# Patient Record
Sex: Male | Born: 1960 | Race: White | Hispanic: No | Marital: Married | Smoking: Former smoker
Health system: Southern US, Community
[De-identification: ages and names within clinical notes are randomized; demographics above are authoritative.]

## PROBLEM LIST (undated history)

## (undated) DIAGNOSIS — Z87442 Personal history of urinary calculi: Secondary | ICD-10-CM

## (undated) DIAGNOSIS — G709 Myoneural disorder, unspecified: Secondary | ICD-10-CM

## (undated) DIAGNOSIS — Z9989 Dependence on other enabling machines and devices: Secondary | ICD-10-CM

## (undated) DIAGNOSIS — I219 Acute myocardial infarction, unspecified: Secondary | ICD-10-CM

## (undated) DIAGNOSIS — I2699 Other pulmonary embolism without acute cor pulmonale: Secondary | ICD-10-CM

## (undated) DIAGNOSIS — E119 Type 2 diabetes mellitus without complications: Secondary | ICD-10-CM

## (undated) DIAGNOSIS — I4892 Unspecified atrial flutter: Secondary | ICD-10-CM

## (undated) DIAGNOSIS — I2510 Atherosclerotic heart disease of native coronary artery without angina pectoris: Secondary | ICD-10-CM

## (undated) DIAGNOSIS — M545 Low back pain, unspecified: Secondary | ICD-10-CM

## (undated) DIAGNOSIS — N200 Calculus of kidney: Secondary | ICD-10-CM

## (undated) DIAGNOSIS — I10 Essential (primary) hypertension: Secondary | ICD-10-CM

## (undated) DIAGNOSIS — E785 Hyperlipidemia, unspecified: Secondary | ICD-10-CM

## (undated) DIAGNOSIS — I4819 Other persistent atrial fibrillation: Secondary | ICD-10-CM

## (undated) DIAGNOSIS — M1990 Unspecified osteoarthritis, unspecified site: Secondary | ICD-10-CM

## (undated) DIAGNOSIS — G4733 Obstructive sleep apnea (adult) (pediatric): Secondary | ICD-10-CM

## (undated) DIAGNOSIS — G8929 Other chronic pain: Secondary | ICD-10-CM

## (undated) DIAGNOSIS — Z8669 Personal history of other diseases of the nervous system and sense organs: Secondary | ICD-10-CM

## (undated) HISTORY — DX: Personal history of other diseases of the nervous system and sense organs: Z86.69

---

## 1997-07-19 ENCOUNTER — Emergency Department (HOSPITAL_COMMUNITY): Admission: EM | Admit: 1997-07-19 | Discharge: 1997-07-19 | Payer: Self-pay | Admitting: Emergency Medicine

## 2009-11-04 ENCOUNTER — Emergency Department (HOSPITAL_COMMUNITY): Admission: EM | Admit: 2009-11-04 | Discharge: 2009-11-04 | Payer: Self-pay | Admitting: Emergency Medicine

## 2010-08-20 ENCOUNTER — Other Ambulatory Visit: Payer: Self-pay | Admitting: Emergency Medicine

## 2010-08-20 DIAGNOSIS — M2391 Unspecified internal derangement of right knee: Secondary | ICD-10-CM

## 2010-08-26 ENCOUNTER — Ambulatory Visit
Admission: RE | Admit: 2010-08-26 | Discharge: 2010-08-26 | Disposition: A | Discharge status: Home / Self Care | Payer: BC Managed Care – PPO | Source: Ambulatory Visit | Attending: Emergency Medicine | Admitting: Emergency Medicine

## 2010-08-26 DIAGNOSIS — M2391 Unspecified internal derangement of right knee: Secondary | ICD-10-CM

## 2012-04-04 ENCOUNTER — Ambulatory Visit: Payer: BC Managed Care – PPO

## 2012-04-04 ENCOUNTER — Ambulatory Visit (INDEPENDENT_AMBULATORY_CARE_PROVIDER_SITE_OTHER): Payer: BC Managed Care – PPO | Admitting: Emergency Medicine

## 2012-04-04 VITALS — BP 134/84 | HR 74 | Temp 97.9°F | Resp 16 | Ht 66.5 in | Wt 231.0 lb

## 2012-04-04 DIAGNOSIS — M25559 Pain in unspecified hip: Secondary | ICD-10-CM

## 2012-04-04 DIAGNOSIS — M549 Dorsalgia, unspecified: Secondary | ICD-10-CM

## 2012-04-04 DIAGNOSIS — N200 Calculus of kidney: Secondary | ICD-10-CM

## 2012-04-04 DIAGNOSIS — M545 Low back pain: Secondary | ICD-10-CM

## 2012-04-04 LAB — POCT URINALYSIS DIPSTICK
Blood, UA: NEGATIVE
Protein, UA: NEGATIVE
Spec Grav, UA: 1.015
Urobilinogen, UA: 0.2
pH, UA: 5

## 2012-04-04 LAB — POCT UA - MICROSCOPIC ONLY
Casts, Ur, LPF, POC: NEGATIVE
Crystals, Ur, HPF, POC: NEGATIVE
Yeast, UA: NEGATIVE

## 2012-04-04 MED ORDER — CYCLOBENZAPRINE HCL 10 MG PO TABS: ORAL_TABLET | ORAL | Status: DC

## 2012-04-04 MED ORDER — MELOXICAM 15 MG PO TABS: 15.0000 mg | ORAL_TABLET | Freq: Every day | ORAL | Status: DC

## 2012-04-04 NOTE — Progress Notes (Signed)
  Subjective:    Patient ID: Paul Todd, male    DOB: 04-06-1960, 52 y.o.   MRN: [id number]  Hip Pain  There was no injury mechanism. The pain is present in the right hip, right leg, left hip and left leg. The quality of the pain is described as aching and cramping. The pain is at a severity of 7/10 (when hes in pain). The pain is moderate. The pain has been worsening since onset. Associated symptoms include numbness and tingling. Associated symptoms comments: In his feet more on the left foot. The symptoms are aggravated by movement and weight bearing. He has tried ice and NSAIDs for the symptoms. The treatment provided no relief.   Complaints of hip pain for 4-5 months left side is more pain full than right side it doesn't bother  Him when he's in bed lying on this sides. He just got a new job 6 months ago so he's more on his feet it hurts more when he's standing or just walking around.   He has tried all types of shoes used ice and heat packs even has taking ibuprofen nothing seems to help. He doesn't have any pain when he's just sitting The pain runs from hips down to feet sometimes his left foot feels numb   Review of Systems  Neurological: Positive for tingling and numbness.       Objective:   Physical Exam There is tenderness over lower lumbar spine. There is limited internal rotation. External rotation is normal. Deep tendon reflexes are 2+ and symmetrical. Straight leg raising is negative to 90 bilaterally motor strength is 5 out of 5 all muscle groups  [facility] reading (PRIMARY) by  Dr.Brynnly Bonet is calcification in the area of the left kidney. There degenerative changes of the lumbar spine. 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 and appears to be some small cystic areas in the neck of the right hip please comment  Results for orders placed in visit on 04/04/12  POCT URINALYSIS DIPSTICK      Result Value Range   Color, UA yellow     Clarity, UA clear     Glucose, UA neg     Bilirubin, UA neg     Ketones, UA neg     Spec Grav, UA 1.015     Blood, UA neg     pH, UA 5.0     Protein, UA neg     Urobilinogen, UA 0.2     Nitrite, UA neg     Leukocytes, UA Negative    POCT UA - MICROSCOPIC ONLY      Result Value Range   WBC, Ur, HPF, POC 0-1     RBC, urine, microscopic 0-1     Bacteria, U Microscopic neg     Mucus, UA neg     Epithelial cells, urine per micros 0-1     Crystals, Ur, HPF, POC neg     Casts, Ur, LPF, POC neg     Yeast, UA neg         Assessment & Plan:  We'll go ahead with x-rays of both hips as well as x-rays of the back. There are degenerative changes present of the LS spine and also of the hips we'll proceed with an MRI. He wants to hold off on a CT of the kidneys at the present time .

## 2012-04-05 ENCOUNTER — Telehealth: Payer: Self-pay

## 2012-04-05 NOTE — Telephone Encounter (Signed)
MRI lumbar spine needs a peer-to-peer done by 04/07/12 at 4:00pm. Call [phone], Opt 2.

## 2012-04-06 ENCOUNTER — Telehealth: Payer: Self-pay | Admitting: Radiology

## 2012-04-06 DIAGNOSIS — M545 Low back pain: Secondary | ICD-10-CM

## 2012-04-06 NOTE — Telephone Encounter (Signed)
Call patient m now the insurance carrier would not approve his MRI. Would he like referral to an orthopedist for their opinion or would he like to give a trial of physical therapy please let me know what she would prefer

## 2012-04-06 NOTE — Telephone Encounter (Signed)
Patient does not qualify for MRI lumbar at this time per [facility] at [facility]. Order retracted for now for Lumbar MRI scan, patient has to be treated for 4-6 weeks before this will be authorized, he must return for office visit after 4-6 weeks of treatment with daily medications, office visit note at this visit must indicate there was not significant improvement with treatment of medications and or physical therapy. Note should also indicate he is a candidate for Epidural injections and or Surgery pending results of MRI.  Please also note any neurologic impairment patient may have at follow up office visit. Also do you wish to proceed with physical therapy since he does not qualify for the MRI . Thanks. Kariah Loredo

## 2012-04-07 NOTE — Telephone Encounter (Signed)
Called patient, he can not go to physical therapy at this time because he works out of town, he agrees to [employer], this is made, to you [employer]

## 2012-04-07 NOTE — Telephone Encounter (Signed)
Patient doesn't meet criteria for this study at this point.  No neurologic symptoms, hasn't tried conservative therapy x 4-6 weeks.

## 2012-06-01 ENCOUNTER — Telehealth: Payer: Self-pay

## 2012-06-01 NOTE — Telephone Encounter (Signed)
Request given to xray 

## 2012-06-01 NOTE — Telephone Encounter (Signed)
Pt is needing a copy of his hip/and back xrays for his appt this week with [facility]  Please call [phone] once ready to pick up

## 2012-11-23 ENCOUNTER — Ambulatory Visit (INDEPENDENT_AMBULATORY_CARE_PROVIDER_SITE_OTHER): Payer: BC Managed Care – PPO | Admitting: Internal Medicine

## 2012-11-23 VITALS — BP 142/86 | HR 68 | Temp 98.1°F | Resp 18 | Ht 66.0 in | Wt 236.6 lb

## 2012-11-23 DIAGNOSIS — A609 Anogenital herpesviral infection, unspecified: Secondary | ICD-10-CM

## 2012-11-23 DIAGNOSIS — B009 Herpesviral infection, unspecified: Secondary | ICD-10-CM

## 2012-11-23 DIAGNOSIS — Z7189 Other specified counseling: Secondary | ICD-10-CM

## 2012-11-23 DIAGNOSIS — R319 Hematuria, unspecified: Secondary | ICD-10-CM

## 2012-11-23 DIAGNOSIS — I499 Cardiac arrhythmia, unspecified: Secondary | ICD-10-CM

## 2012-11-23 DIAGNOSIS — N200 Calculus of kidney: Secondary | ICD-10-CM

## 2012-11-23 LAB — LIPID PANEL: Cholesterol: 203 mg/dL — ABNORMAL HIGH (ref 0–200)

## 2012-11-23 LAB — POCT CBC
HCT, POC: 52.8 % (ref 43.5–53.7)
Hemoglobin: 16.6 g/dL (ref 14.1–18.1)
MCH, POC: 31.9 pg — AB (ref 27–31.2)
MPV: 10 fL (ref 0–99.8)
POC MID %: 6.9 %M (ref 0–12)
RBC: 5.21 M/uL (ref 4.69–6.13)
WBC: 10.1 10*3/uL (ref 4.6–10.2)

## 2012-11-23 LAB — POCT URINALYSIS DIPSTICK
Bilirubin, UA: NEGATIVE
Glucose, UA: NEGATIVE
Spec Grav, UA: 1.015
Urobilinogen, UA: 0.2

## 2012-11-23 LAB — PSA: PSA: 1.65 ng/mL (ref <=4.00)

## 2012-11-23 LAB — COMPREHENSIVE METABOLIC PANEL
Albumin: 4.2 g/dL (ref 3.5–5.2)
BUN: 17 mg/dL (ref 6–23)
Calcium: 9 mg/dL (ref 8.4–10.5)
Chloride: 106 mEq/L (ref 96–112)
Glucose, Bld: 98 mg/dL (ref 70–99)
Potassium: 4.3 mEq/L (ref 3.5–5.3)

## 2012-11-23 LAB — POCT UA - MICROSCOPIC ONLY
Bacteria, U Microscopic: NEGATIVE
Casts, Ur, LPF, POC: NEGATIVE
Mucus, UA: NEGATIVE

## 2012-11-23 LAB — TSH: TSH: 1.221 u[IU]/mL (ref 0.350–4.500)

## 2012-11-23 MED ORDER — VALACYCLOVIR HCL 1 G PO TABS: 1000.0000 mg | ORAL_TABLET | Freq: Two times a day (BID) | ORAL | Status: DC

## 2012-11-23 NOTE — Patient Instructions (Signed)
Cardiac Arrhythmia Your heart is a muscle that works to pump blood through your body by regular contractions. The beating of your heart is controlled by a system of special pacemaker cells. These cells control the electrical activity of the heart. When the system controlling this regular beating is disturbed, a heart rhythm abnormality (arrhythmia) results. WHEN YOUR HEART SKIPS A BEAT One of the most common and least serious heart arrhythmias is called an ectopic or premature atrial heartbeat (PAC). This may be noticed as a small change in your regular pulse. A PAC originates from the top part (atrium) of the heart. Within the right atrium, the SA node is the area that normally controls the regularity of the heart. PACs occur in heart tissue outside of the SA node region. You may feel this as a skipped beat or heart flutter, especially if several occur in succession or occur frequently.  Another arrhythmia is ventricular premature complex (VCP or PVC). These extra beats start out in the bottom, more muscular chambers of the heart. In most cases a PVC is harmless. If there are underlying causes that are making the heart irritable such as an overactive thyroid or a prior heart attack PVCs may be of more concern. In a few cases, medications to control the heart rhythm may be prescribed. Things to try at home:  Cut down or avoid alcohol, tobacco and caffeine.  Get enough sleep.  Reduce stress.  Exercise more. WHEN THE HEART BEATS TOO FAST Atrial tachycardia is a fast heart rate, which starts out in the atrium. It may last from minutes to much longer. Your heart may beat 140 to 240 times per minute instead of the normal 60 to 100.  Symptoms include a worried feeling (anxiety) and a sense that your heart is beating fast and hard.  You may be able to stop the fast rate by holding your breath or bearing down as if you were going to have a bowel movement.  This type of fast rate is usually not  dangerous. Atrial fibrillation and atrial flutter are other fast rhythms that start in the atria. Both conditions keep the atria from filling with enough blood so the heart does not work well.  Symptoms include feeling light-headed or faint.  These fast rates may be the result of heart damage or disease. Too much thyroid hormone may play a role.  There may be no clear cause or it may be from heart disease or damage.  Medication or a special electrical treatment (cardioversion) may be needed to get the heart beating normally. Ventricular tachycardia is a fast heart rate that starts in the lower muscular chambers (ventricles) This is a serious disorder that requires treatment as soon as possible. You need someone else to get and use a small defibrillator.  Symptoms include collapse, chest pain, or being short of breath.  Treatment may include medication, procedures to improve blood flow to the heart, or an implantable cardiac defibrillator (ICD). DIAGNOSIS   A cardiogram (EKG or ECG) will be done to see the arrhythmia, as well as lab tests to check the underlying cause.  If the extra beats or fast rate come and go, you may wear a Holter monitor that records your heart rate for a longer period of time. SEEK MEDICAL CARE IF:  You have irregular or fast heartbeats (palpitations).  You experience skipped beats.  You develop lightheadedness.  You have chest discomfort.  You have shortness of breath.  You have more frequent episodes, if  you are already being treated. SEEK IMMEDIATE MEDICAL CARE IF:   You have severe chest pain, especially if the pain is crushing or pressure-like and spreads to the arms, back, neck, or jaw, or if you have sweating, feeling sick to your stomach (nausea), or shortness of breath. THIS IS AN EMERGENCY. Do not wait to see if the pain will go away. Get medical help at once. Call 911 or 0 (operator). DO NOT drive yourself to the hospital.  You feel dizzy or  faint.  You have episodes of previously documented atrial tachycardia that do not resolve with the techniques your caregiver has taught you.  Irregular or rapid heartbeats begin to occur more often than in the past, especially if they are associated with more pronounced symptoms or of longer duration. Document Released: 12/23/2004 Document Revised: 03/17/2011 Document Reviewed: 08/11/2007 [facility] Patient Information 2014 [place], [place]. Heartbeats (How the Heart Works) Your heart is a hollow muscular organ that pumps blood around your body. This is necessary for life because the blood carries the oxygen we breathe and the food we eat to all the cells of the body. The blood also carries the waste products away from the cells.  Humans have a heart with four chambers. A heartbeat is a two-part pumping action that takes about a second. Blood collects in the two upper chambers of the heart (the right and left atria). When these chambers are full, a group of specialized cells (the sinoatrial node) sends out an electrical signal that makes the atria squeeze (contract). This contraction completes the filling of the resting lower heart chambers (the right and left ventricles) by pushing a little extra blood through the mitral and tricuspid valves. The ventricles are the muscular chambers of the heart. The right ventricle pushes blood through the lungs. Because the left ventricle pumps blood to the rest of the body, it is more muscular. The period when the ventricles are filling is called diastole. The bottom number in your blood pressure is measured at this time. When the ventricles are contracting, it is called systole. Systole is the top number in your blood pressure.  Once the bottom muscular chambers of the heart are full of blood, slightly delayed electrical signals from the atria travel along a network of cells to the ventricles, causing them to contract. As the tricuspid and mitral valves shut tight to  prevent a backflow of blood, the pulmonary (pulmonic) and aortic valves are pushed open. While the right ventricle pushes blood through the lungs to pick up oxygen, oxygen-rich blood flows from the left ventricle to the heart and other parts of the body.  When the ventricles relax, the pulmonary and aortic valves close. The lower pressure in the ventricles causes the tricuspid and mitral valves to open, and blood stored in the atria rushes in and the cycle begins again.  Your heart beats faster and works harder during times of exertion. It works less hard and beats more slowly when you are resting. Your brain sends signals to the heart to meet the needs of your body. By avoiding smoking and controlling blood pressure, cholesterol, and blood sugar, you may help your heart stay healthy longer.  Document Released: 11/28/2003 Document Revised: 08/25/2012 Document Reviewed: 05/17/2012 [facility] Patient Information 2014 [place], [place].

## 2012-11-23 NOTE — Progress Notes (Signed)
  Subjective:    Patient ID: Paul Todd, male    DOB: 10/30/1960, 52 y.o.   MRN: [id number]  HPI Hx of kidney stone and heart irregular beat while traveling, passed kidney stone in hospital. Was started on 325mg  asa and told to f/up with family doctor. Also started on metoprolol for arrythmia. Has no sxs now, feels great.. Dr. Cleta Alberts his doctor and will schedule cpe.   Review of Systems     Objective:   Physical Exam  Constitutional: He is oriented to person, place, and time. He appears well-developed and well-nourished. No distress.  HENT:  Right Ear: External ear normal.  Left Ear: External ear normal.  Eyes: EOM are normal.  Neck: Neck supple.  Cardiovascular: Normal rate, regular rhythm and normal heart sounds.   Pulmonary/Chest: Effort normal and breath sounds normal. No respiratory distress. He has no wheezes. He has no rales. He exhibits no tenderness.  Neurological: He is alert and oriented to person, place, and time. He exhibits normal muscle tone. Coordination normal.  Psychiatric: He has a normal mood and affect.   EKG normal probably Results for orders placed in visit on 11/23/12  POCT CBC      Result Value Range   WBC 10.1  4.6 - 10.2 K/uL   Lymph, poc 3.1  0.6 - 3.4   POC LYMPH PERCENT 30.2  10 - 50 %L   MID (cbc) 0.7  0 - 0.9   POC MID % 6.9  0 - 12 %M   POC Granulocyte 6.3  2 - 6.9   Granulocyte percent 62.8  37 - 80 %G   RBC 5.21  4.69 - 6.13 M/uL   Hemoglobin 16.6  14.1 - 18.1 g/dL   HCT, POC 40.9  81.1 - 53.7 %   MCV 101.3 (*) 80 - 97 fL   MCH, POC 31.9 (*) 27 - 31.2 pg   MCHC 31.4 (*) 31.8 - 35.4 g/dL   RDW, POC 91.4     Platelet Count, POC 183  142 - 424 K/uL   MPV 10.0  0 - 99.8 fL  POCT UA - MICROSCOPIC ONLY      Result Value Range   WBC, Ur, HPF, POC 0-1     RBC, urine, microscopic 6-10     Bacteria, U Microscopic neg     Mucus, UA neg     Epithelial cells, urine per micros neg     Crystals, Ur, HPF, POC neg     Casts, Ur, LPF, POC neg      Yeast, UA neg    POCT URINALYSIS DIPSTICK      Result Value Range   Color, UA yellow     Clarity, UA clear     Glucose, UA neg     Bilirubin, UA neg     Ketones, UA neg     Spec Grav, UA 1.015     Blood, UA moderate     pH, UA 5.0     Protein, UA neg     Urobilinogen, UA 0.2     Nitrite, UA neg     Leukocytes, UA Negative       Feels well     Assessment & Plan:  Kidney stone passed/Hematuria persists Stable arrythmia on new metoprolol/EKG poor rwave progression anteriorly See Dr, Cleta Alberts for cpe/labs done plan further eval hematuria

## 2013-01-24 ENCOUNTER — Encounter (INDEPENDENT_AMBULATORY_CARE_PROVIDER_SITE_OTHER): Payer: Self-pay

## 2013-01-28 ENCOUNTER — Encounter: Payer: Self-pay | Admitting: Internal Medicine

## 2013-01-28 ENCOUNTER — Ambulatory Visit (INDEPENDENT_AMBULATORY_CARE_PROVIDER_SITE_OTHER): Payer: BC Managed Care – PPO | Admitting: Internal Medicine

## 2013-01-28 VITALS — BP 134/90 | HR 66 | Ht 67.0 in | Wt 229.9 lb

## 2013-01-28 DIAGNOSIS — Z789 Other specified health status: Secondary | ICD-10-CM

## 2013-01-28 DIAGNOSIS — F17200 Nicotine dependence, unspecified, uncomplicated: Secondary | ICD-10-CM

## 2013-01-28 DIAGNOSIS — R5383 Other fatigue: Secondary | ICD-10-CM

## 2013-01-28 DIAGNOSIS — R0609 Other forms of dyspnea: Secondary | ICD-10-CM

## 2013-01-28 DIAGNOSIS — G4710 Hypersomnia, unspecified: Secondary | ICD-10-CM

## 2013-01-28 DIAGNOSIS — Z13220 Encounter for screening for lipoid disorders: Secondary | ICD-10-CM

## 2013-01-28 DIAGNOSIS — I4891 Unspecified atrial fibrillation: Secondary | ICD-10-CM

## 2013-01-28 DIAGNOSIS — R5381 Other malaise: Secondary | ICD-10-CM

## 2013-01-28 DIAGNOSIS — I480 Paroxysmal atrial fibrillation: Secondary | ICD-10-CM

## 2013-01-28 DIAGNOSIS — Z720 Tobacco use: Secondary | ICD-10-CM

## 2013-01-28 DIAGNOSIS — Z9189 Other specified personal risk factors, not elsewhere classified: Secondary | ICD-10-CM

## 2013-01-28 DIAGNOSIS — Z87898 Personal history of other specified conditions: Secondary | ICD-10-CM

## 2013-01-28 DIAGNOSIS — R079 Chest pain, unspecified: Secondary | ICD-10-CM

## 2013-01-28 DIAGNOSIS — R0683 Snoring: Secondary | ICD-10-CM

## 2013-01-28 DIAGNOSIS — Z7289 Other problems related to lifestyle: Secondary | ICD-10-CM

## 2013-01-28 DIAGNOSIS — R0989 Other specified symptoms and signs involving the circulatory and respiratory systems: Secondary | ICD-10-CM

## 2013-01-28 DIAGNOSIS — Z8249 Family history of ischemic heart disease and other diseases of the circulatory system: Secondary | ICD-10-CM

## 2013-01-28 DIAGNOSIS — I200 Unstable angina: Secondary | ICD-10-CM

## 2013-01-28 NOTE — Patient Instructions (Addendum)
Please have fasting blood work today.   Your physician has recommended that you have a sleep study. This test records several body functions during sleep, including: brain activity, eye movement, oxygen and carbon dioxide blood levels, heart rate and rhythm, breathing rate and rhythm, the flow of air through your mouth and nose, snoring, body muscle movements, and chest and belly movement. This will be scheduled at [facility].  Your physician has requested that you have a lexiscan myoview. For further information please visit [email or web address]. Please follow instruction sheet, as given.  Your physician recommends that you schedule a follow-up appointment in 2-3 weeks, or before you leave for work.

## 2013-01-30 ENCOUNTER — Encounter: Payer: Self-pay | Admitting: Internal Medicine

## 2013-01-30 DIAGNOSIS — I214 Non-ST elevation (NSTEMI) myocardial infarction: Secondary | ICD-10-CM | POA: Insufficient documentation

## 2013-01-30 DIAGNOSIS — Z720 Tobacco use: Secondary | ICD-10-CM | POA: Insufficient documentation

## 2013-01-30 DIAGNOSIS — Z8249 Family history of ischemic heart disease and other diseases of the circulatory system: Secondary | ICD-10-CM | POA: Insufficient documentation

## 2013-01-30 DIAGNOSIS — R5383 Other fatigue: Secondary | ICD-10-CM | POA: Insufficient documentation

## 2013-01-30 DIAGNOSIS — I480 Paroxysmal atrial fibrillation: Secondary | ICD-10-CM | POA: Insufficient documentation

## 2013-01-30 NOTE — Progress Notes (Signed)
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 He apparently travels significantly and has a pretty poor diet. He reports drinking about 6 alcoholic beverages per week and smokes about one half pack per day for the past 30 years. Recently, while up in [place], he was suffering from a kidney stone and was noted to have atrial fibrillation. He was unaware of this, and was given medication to slow his heart rate. Apparently he converted back into sinus and was instructed to be on aspirin.  He has recently been describing some chest pain which is substernal and radiates to the jaw and down into his right arm. The symptoms last for about 5-10 minutes and go away at rest, if he has been exerting himself. He also has a history of heart disease both in his mother and a father who had high blood pressure.  In addition he filled out an S4 sleepiness scale today which was scored as 12 suggesting a higher than likely possibility of sleep apnea.  He does report difficulty staying asleep, and feeling drowsy throughout the day with the need to take frequent naps.  PMHx:  Past Medical History  Diagnosis Date  . Chronic kidney disease     History reviewed. No pertinent past surgical history.  FAMHx:  Family History  Problem Relation Age of Onset  . Heart disease Mother   . Diabetes Mother   . Diabetes Father     SOCHx:   reports that he has been smoking Cigarettes.  He has a 17.5 pack-year smoking history. He has never used smokeless tobacco. He reports that he drinks alcohol. He reports that he does not use illicit drugs.  ALLERGIES:  No Known Allergies  ROS: A comprehensive review of systems was negative except for: Constitutional: positive for fatigue and difficulty sleeping, snoring Cardiovascular: positive for exertional chest  pressure/discomfort  HOME MEDS: Current Outpatient Prescriptions  Medication Sig Dispense Refill  . aspirin 325 MG tablet Take 325 mg by mouth daily.       No current facility-administered medications for this visit.    LABS/IMAGING: No results found for this or any previous visit (from the past 48 hour(s)). No results found.  VITALS: BP 134/90  Pulse 66  Ht 5\' 7"  (1.702 m)  Wt 229 lb 14.4 oz (104.282 kg)  BMI 36.00 kg/m2  EXAM: General appearance: alert and no distress Neck: no carotid bruit and no JVD Lungs: clear to auscultation bilaterally Heart: regular rate and rhythm, S1, S2 normal, no murmur, click, rub or gallop Abdomen: soft, non-tender; bowel sounds normal; no masses,  no organomegaly Extremities: extremities normal, atraumatic, no cyanosis or edema Pulses: 2+ and symmetric Skin: Skin color, texture, turgor normal. No rashes or lesions Neurologic: Grossly normal Psych: Mood, affect normal  EKG: Normal sinus rhythm at 66  ASSESSMENT: 1. Chest pain concerning for unstable angina 2. Tobacco abuse 3. Family history of coronary disease 4. Paroxysmal atrial fibrillation 5. Fatigue/snoring, concerning for possible obstructive sleep apnea 6. Low back pain, difficulty walking  PLAN: 1.   Mr. Torrence has been having chest pain which radiates to his jaw and to his right arm that is concerning for unstable angina. This could possibly be acid reflux, however the symptoms are more exertional and tender last for about 5-10 minutes and improved with rest. He also had an  unexplained episode of paroxysmal atrial fibrillation, without recurrence that I am aware of. His EKG shows sinus rhythm today. This could have been related to his acute kidney stone and medications, however it does raise the possibility of underlying ischemia or possibly obstructive sleep apnea. He did have a score of 12 on his Epworth Sleepiness Scale today. I would therefore recommend a formal sleep study.  He should also have a lexiscan nuclear stress test to further evaluate for coronary ischemia.  Unfortunately, with chronic back pain issues, he will not be able to walk on a treadmill. I will also plan to get a fasting lipid profile today as well to get some idea about his cholesterol risk.  Plan to see him back to discuss the findings in a few weeks.  Thanks again for the referral.  Pixie Casino, MD, [facility] Attending Cardiologist [facility]  HILTY,Kenneth C 01/30/2013, 2:29 PM

## 2013-01-31 LAB — NMR LIPOPROFILE WITH LIPIDS
Cholesterol, Total: 195 mg/dL (ref <200)
HDL Particle Number: 34.9 umol/L (ref >=30.5)
HDL SIZE: 8.5 nm — AB (ref >=9.2)
HDL-C: 43 mg/dL (ref >=40)
LARGE HDL: 1.5 umol/L — AB (ref >=4.8)
LARGE VLDL-P: 5.7 nmol/L — AB (ref <=2.7)
LDL (calc): 111 mg/dL — ABNORMAL HIGH (ref <100)
LDL PARTICLE NUMBER: 1774 nmol/L — AB (ref <1000)
LDL SIZE: 20 nm — AB (ref >20.5)
LP-IR Score: 79 — ABNORMAL HIGH (ref <=45)
SMALL LDL PARTICLE NUMBER: 1186 nmol/L — AB (ref <=527)
TRIGLYCERIDES: 206 mg/dL — AB (ref <150)
VLDL SIZE: 48.2 nm — AB (ref <=46.6)

## 2013-02-02 ENCOUNTER — Other Ambulatory Visit (HOSPITAL_COMMUNITY): Payer: Self-pay | Admitting: *Deleted

## 2013-02-02 ENCOUNTER — Telehealth: Payer: Self-pay | Admitting: *Deleted

## 2013-02-02 ENCOUNTER — Ambulatory Visit (HOSPITAL_COMMUNITY)
Admission: RE | Admit: 2013-02-02 | Discharge: 2013-02-02 | Disposition: A | Discharge status: Home / Self Care | Payer: BC Managed Care – PPO | Source: Ambulatory Visit | Attending: Cardiovascular Disease | Admitting: Cardiovascular Disease

## 2013-02-02 DIAGNOSIS — R5381 Other malaise: Secondary | ICD-10-CM | POA: Insufficient documentation

## 2013-02-02 DIAGNOSIS — I480 Paroxysmal atrial fibrillation: Secondary | ICD-10-CM

## 2013-02-02 DIAGNOSIS — E785 Hyperlipidemia, unspecified: Secondary | ICD-10-CM

## 2013-02-02 DIAGNOSIS — M79609 Pain in unspecified limb: Secondary | ICD-10-CM | POA: Insufficient documentation

## 2013-02-02 DIAGNOSIS — E669 Obesity, unspecified: Secondary | ICD-10-CM | POA: Insufficient documentation

## 2013-02-02 DIAGNOSIS — F17200 Nicotine dependence, unspecified, uncomplicated: Secondary | ICD-10-CM | POA: Insufficient documentation

## 2013-02-02 DIAGNOSIS — R002 Palpitations: Secondary | ICD-10-CM | POA: Insufficient documentation

## 2013-02-02 DIAGNOSIS — R079 Chest pain, unspecified: Secondary | ICD-10-CM

## 2013-02-02 DIAGNOSIS — R5383 Other fatigue: Secondary | ICD-10-CM

## 2013-02-02 DIAGNOSIS — Z8249 Family history of ischemic heart disease and other diseases of the circulatory system: Secondary | ICD-10-CM | POA: Insufficient documentation

## 2013-02-02 DIAGNOSIS — Z789 Other specified health status: Secondary | ICD-10-CM

## 2013-02-02 DIAGNOSIS — I4891 Unspecified atrial fibrillation: Secondary | ICD-10-CM

## 2013-02-02 DIAGNOSIS — R42 Dizziness and giddiness: Secondary | ICD-10-CM | POA: Insufficient documentation

## 2013-02-02 DIAGNOSIS — R0602 Shortness of breath: Secondary | ICD-10-CM | POA: Insufficient documentation

## 2013-02-02 MED ORDER — REGADENOSON 0.4 MG/5ML IV SOLN
0.4000 mg | Freq: Once | INTRAVENOUS | Status: AC
  Administered 2013-02-02: 0.4 mg via INTRAVENOUS

## 2013-02-02 MED ORDER — TECHNETIUM TC 99M SESTAMIBI GENERIC - CARDIOLITE
30.6000 | Freq: Once | INTRAVENOUS | Status: AC | PRN
  Administered 2013-02-02: 31 via INTRAVENOUS

## 2013-02-02 MED ORDER — ATORVASTATIN CALCIUM 40 MG PO TABS: 40.0000 mg | ORAL_TABLET | Freq: Every day | ORAL | Status: DC

## 2013-02-02 MED ORDER — AMINOPHYLLINE 25 MG/ML IV SOLN
75.0000 mg | Freq: Once | INTRAVENOUS | Status: AC
  Administered 2013-02-02: 75 mg via INTRAVENOUS

## 2013-02-02 MED ORDER — TECHNETIUM TC 99M SESTAMIBI GENERIC - CARDIOLITE
10.9000 | Freq: Once | INTRAVENOUS | Status: AC | PRN
  Administered 2013-02-02: 10.9 via INTRAVENOUS

## 2013-02-02 NOTE — Procedures (Addendum)
[place] [address] [place] [address] [phone]  Cardiology Nuclear Med Study  Paul Todd is a 54 y.o. male     MRN : [id number]     DOB: [birth year]  Procedure Date: 02/02/2013  Nuclear Med Background Indication for Stress Test:  Evaluation for Ischemia History:  A-Fib Cardiac Risk Factors: Family History - CAD, Obesity and Smoker  Symptoms:  Chest Pain, Fatigue, Light-Headedness, Palpitations and Bilateral leg pain.   Nuclear Pre-Procedure Caffeine/Decaff Intake:  7:00pm NPO After: 5:00am   IV Site: R Hand  IV 0.9% NS with Angio Cath:  22g  Chest Size (in):  42"  IV Started by: Azucena Cecil, RN  Height: 5\' 7"  (1.702 m)  Cup Size: n/a  BMI:  Body mass index is 35.86 kg/(m^2). Weight:  229 lb (103.874 kg)   Tech Comments:  n/a    Nuclear Med Study 1 or 2 day study: 1 day  Stress Test Type:  [place] Provider:  Lyman Bishop, MD   Resting Radionuclide: Technetium 78m Sestamibi  Resting Radionuclide Dose: 10.9 mCi   Stress Radionuclide:  Technetium 37m Sestamibi  Stress Radionuclide Dose: 30.6 mCi           Stress Protocol Rest HR: 67 Stress HR: 86  Rest BP: 150/98 Stress BP: 153/73  Exercise Time (min): n/a METS: n/a   Predicted Max HR: 167 bpm % Max HR: 53.29 bpm Rate Pressure Product: 13884  Dose of Adenosine (mg):  n/a Dose of Lexiscan: 0.4 mg  Dose of Atropine (mg): n/a Dose of Dobutamine: n/a mcg/kg/min (at max HR)  Stress Test Technologist: Leane Para, CCT Nuclear Technologist: Imagene Riches, CNMT   Rest Procedure:  Myocardial perfusion imaging was performed at rest 45 minutes following the intravenous administration of Technetium 57m Sestamibi. Stress Procedure:  The patient received IV Lexiscan 0.4 mg over 15-seconds.  Technetium 63m Sestamibi injected at 30-seconds.  The patient had SOB and developed a headache late in recovery; 75 mg of IV Aminophylline was  administered with resolution of symptoms.  There were no significant changes with Lexiscan.  Quantitative spect images were obtained after a 45 minute delay.  Transient Ischemic Dilatation (Normal <1.22):  1.03 Lung/Heart Ratio (Normal <0.45):  0.31 QGS EDV:  119 ml QGS ESV:  63 ml LV Ejection Fraction: 48%  Signed by       Rest ECG: NSR - Normal EKG and PRWP  Stress ECG: No significant ST segment change suggestive of ischemia.  QPS Raw Data Images:  Normal; no motion artifact; normal heart/lung ratio. Stress Images:  Normal homogeneous uptake with mild basl inferior thinning. Rest Images:  Normal homogeneous uptake in all areas of the myocardium. Subtraction (SDS):  No evidence for significant ischemia.  Impression Exercise Capacity:  Lexiscan with no exercise. BP Response:  Normal blood pressure response. Clinical Symptoms:  Mild shortness of breath ECG Impression:  No significant ST segment change suggestive of ischemia. Comparison with Prior Nuclear Study: No images to compare  Overall Impression:  Low risk nuclear study without evidence for significant scar or ischemia.  LV Wall Motion:  Mildly depressed LV Function, EF 48%; NL Wall Motion   Shelva Majestic A, MD  02/02/2013 6:45 PM

## 2013-02-02 NOTE — Telephone Encounter (Signed)
Message copied by Fidel Levy on Wed Feb 02, 2013  3:10 PM ------      Message from: Pixie Casino      Created: Wed Feb 02, 2013  1:10 PM       Cholesterol is too high. Recommend starting lipitor 40 mg daily.            -[name] ------

## 2013-02-02 NOTE — Telephone Encounter (Signed)
Called patient with lab results. Instructed to start lipitor 40mg  daily per [name] and repeat NMR in 3 months

## 2013-02-08 ENCOUNTER — Encounter (HOSPITAL_COMMUNITY): Payer: Self-pay | Admitting: Pharmacy Technician

## 2013-02-08 ENCOUNTER — Encounter: Payer: Self-pay | Admitting: Internal Medicine

## 2013-02-08 ENCOUNTER — Ambulatory Visit (INDEPENDENT_AMBULATORY_CARE_PROVIDER_SITE_OTHER): Payer: BC Managed Care – PPO | Admitting: Internal Medicine

## 2013-02-08 VITALS — BP 150/80 | HR 68 | Ht 67.0 in | Wt 231.4 lb

## 2013-02-08 DIAGNOSIS — Z9189 Other specified personal risk factors, not elsewhere classified: Secondary | ICD-10-CM

## 2013-02-08 DIAGNOSIS — Z8249 Family history of ischemic heart disease and other diseases of the circulatory system: Secondary | ICD-10-CM

## 2013-02-08 DIAGNOSIS — R079 Chest pain, unspecified: Secondary | ICD-10-CM

## 2013-02-08 DIAGNOSIS — F17200 Nicotine dependence, unspecified, uncomplicated: Secondary | ICD-10-CM

## 2013-02-08 DIAGNOSIS — I4891 Unspecified atrial fibrillation: Secondary | ICD-10-CM

## 2013-02-08 DIAGNOSIS — R5381 Other malaise: Secondary | ICD-10-CM

## 2013-02-08 DIAGNOSIS — Z720 Tobacco use: Secondary | ICD-10-CM

## 2013-02-08 DIAGNOSIS — R5383 Other fatigue: Secondary | ICD-10-CM

## 2013-02-08 DIAGNOSIS — D689 Coagulation defect, unspecified: Secondary | ICD-10-CM

## 2013-02-08 DIAGNOSIS — I480 Paroxysmal atrial fibrillation: Secondary | ICD-10-CM

## 2013-02-08 DIAGNOSIS — I200 Unstable angina: Secondary | ICD-10-CM

## 2013-02-08 DIAGNOSIS — Z87898 Personal history of other specified conditions: Secondary | ICD-10-CM

## 2013-02-08 DIAGNOSIS — R9439 Abnormal result of other cardiovascular function study: Secondary | ICD-10-CM

## 2013-02-08 DIAGNOSIS — Z01818 Encounter for other preprocedural examination: Secondary | ICD-10-CM

## 2013-02-08 NOTE — Progress Notes (Signed)
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 He apparently travels significantly and has a pretty poor diet. He reports drinking about 6 alcoholic beverages per week and smokes about one half pack per day for the past 30 years. Recently, while up in [place], he was suffering from a kidney stone and was noted to have atrial fibrillation. He was unaware of this, and was given medication to slow his heart rate. Apparently he converted back into sinus and was instructed to be on aspirin.  He has recently been describing some chest pain which is substernal and radiates to the jaw and down into his right arm. The symptoms last for about 5-10 minutes and go away at rest, if he has been exerting himself. He also has a history of heart disease both in his mother and a father who had high blood pressure.  In addition he filled out an S4 sleepiness scale today which was scored as 12 suggesting a higher than likely possibility of sleep apnea.  He does report difficulty staying asleep, and feeling drowsy throughout the day with the need to take frequent naps.   [name] returns today for followup of his nuclear stress test which was performed on 02/02/2013. This showed essentially no reversible ischemia, however the EF was reduced at 48%. It is unclear to me whether this is a gating abnormality or perhaps due to long-standing hypertension, undiagnosed sleep apnea, or multivessel coronary disease. He did have the episode of paroxysmal atrial fibrillation and continues to have chest pain which is very concerning for angina. He also had a recent lipid profile demonstrating an LDL of 111 and LDL particle number of 1774.  Triglycerides were elevated at 206.  I recommended starting Lipitor 40 mg daily.  PMHx:  Past Medical History  Diagnosis Date  . Chronic  kidney disease     History reviewed. No pertinent past surgical history.  FAMHx:  Family History  Problem Relation Age of Onset  . Heart disease Mother   . Diabetes Mother   . Diabetes Father     SOCHx:   reports that he has been smoking Cigarettes.  He has a 17.5 pack-year smoking history. He has never used smokeless tobacco. He reports that he drinks alcohol. He reports that he does not use illicit drugs.  ALLERGIES:  No Known Allergies  ROS: A comprehensive review of systems was negative except for: Constitutional: positive for fatigue and difficulty sleeping, snoring Cardiovascular: positive for exertional chest pressure/discomfort  HOME MEDS: Current Outpatient Prescriptions  Medication Sig Dispense Refill  . aspirin 325 MG tablet Take 325 mg by mouth daily.      Marland Kitchen atorvastatin (LIPITOR) 40 MG tablet Take 1 tablet (40 mg total) by mouth daily.  90 tablet  3  . naproxen sodium (ALEVE) 220 MG tablet Take 440 mg by mouth daily as needed.      Marland Kitchen omeprazole (PRILOSEC OTC) 20 MG tablet Take 20 mg by mouth daily.       No current facility-administered medications for this visit.    LABS/IMAGING: No results found for this or any previous visit (from the past 48 hour(s)). No results found.  VITALS: BP 150/80  Pulse 68  Ht 5\' 7"  (1.702 m)  Wt 231 lb 6.4 oz (104.962 kg)  BMI 36.23 kg/m2  EXAM: deferred  EKG: deferred  ASSESSMENT: 1. Continued chest pain concerning for unstable angina - low risk NST but EF 48% 2. Tobacco abuse 3. Family history of coronary disease 4. Paroxysmal atrial fibrillation 5. Fatigue/snoring, concerning for possible obstructive sleep apnea 6. Low back pain, difficulty walking  PLAN: 1.   [name] continues to have chest pain which radiates to the jaw and to the right arm. Some of the symptoms or after eating which could represent reflux, however his stress test did demonstrate an abnormal EF of 48%. This could represent a gating  abnormality or perhaps a nonischemic cardiomyopathy. However given his ongoing chest pain, concerned about possible balanced ischemia. He does have a number of cardiac risk factors and had the episode of paroxysmal A. fib in the past.  At this point given his ongoing chest pain, would recommend cardiac catheterization for definitive study to evaluate for possible ischemia. We discussed the risk and benefits of this procedure today and he is agreeable to it.  I suspect we can use a radial approach. He is scheduled for a sleep study tonight and hopefully that will shed some light on whether he has sleep apnea, which I highly suspect. Finally, I have encouraged him to continue to take Lipitor and we'll recheck a lipid profile in about 3 months.  If his symptoms are possibly due to reflux, I also recommended he start taking omeprazole 20 mg daily.  Thanks again for the referral. I will be in touch regarding the results of his cardiac catheterization.  Pixie Casino, MD, [facility] Attending Cardiologist [facility]  Christianjames Soule C 02/08/2013, 5:13 PM

## 2013-02-08 NOTE — Patient Instructions (Addendum)
Your physician has requested that you have a cardiac catheterization. Cardiac catheterization is used to diagnose and/or treat various heart conditions. Doctors may recommend this procedure for a number of different reasons. The most common reason is to evaluate chest pain. Chest pain can be a symptom of coronary artery disease (CAD), and cardiac catheterization can show whether plaque is narrowing or blocking your heart's arteries. This procedure is also used to evaluate the valves, as well as measure the blood flow and oxygen levels in different parts of your heart. For further information please visit [facility]. Please follow instruction sheet, as given.  Please try to schedule this for Tuesday 2/10 with [name].  You will need to have blood work and a chest x-ray 3-5 days prior to this procedure.  Please go to [address] [facility] [facility]) to have both.  No appointment is needed.   Your physician recommends that you schedule a follow-up appointment after your heart catheterization.  Please start taking omeprazole 20 mg daily.

## 2013-02-10 ENCOUNTER — Ambulatory Visit
Admission: RE | Admit: 2013-02-10 | Discharge: 2013-02-10 | Disposition: A | Discharge status: Home / Self Care | Payer: BC Managed Care – PPO | Source: Ambulatory Visit | Attending: Internal Medicine | Admitting: Internal Medicine

## 2013-02-10 DIAGNOSIS — Z01818 Encounter for other preprocedural examination: Secondary | ICD-10-CM

## 2013-02-10 LAB — BASIC METABOLIC PANEL
BUN: 18 mg/dL (ref 6–23)
CO2: 22 mEq/L (ref 19–32)
CREATININE: 0.83 mg/dL (ref 0.50–1.35)
Calcium: 8.9 mg/dL (ref 8.4–10.5)
Chloride: 107 mEq/L (ref 96–112)
GLUCOSE: 141 mg/dL — AB (ref 70–99)
POTASSIUM: 4.1 meq/L (ref 3.5–5.3)
Sodium: 139 mEq/L (ref 135–145)

## 2013-02-10 LAB — PROTIME-INR
INR: 0.94 (ref <1.50)
Prothrombin Time: 12.5 seconds (ref 11.6–15.2)

## 2013-02-10 LAB — APTT: aPTT: 28 seconds (ref 24–37)

## 2013-02-10 LAB — CBC
HCT: 44.5 % (ref 39.0–52.0)
Hemoglobin: 15.5 g/dL (ref 13.0–17.0)
MCH: 31.6 pg (ref 26.0–34.0)
MCHC: 34.8 g/dL (ref 30.0–36.0)
MCV: 90.8 fL (ref 78.0–100.0)
PLATELETS: 176 10*3/uL (ref 150–400)
RBC: 4.9 MIL/uL (ref 4.22–5.81)
RDW: 14 % (ref 11.5–15.5)
WBC: 9.5 10*3/uL (ref 4.0–10.5)

## 2013-02-10 LAB — TSH: TSH: 1.195 u[IU]/mL (ref 0.350–4.500)

## 2013-02-11 ENCOUNTER — Encounter (HOSPITAL_COMMUNITY): Payer: Self-pay | Admitting: Pharmacy Technician

## 2013-02-11 ENCOUNTER — Telehealth: Payer: Self-pay | Admitting: Internal Medicine

## 2013-02-11 NOTE — Telephone Encounter (Signed)
Patient was scheduled for cardiac cath on Tuesday 02/15/13 with [name].  He states that he received a call from [facility] and was informed of his financial responsibility for this procedure and states that he cannot afford it.  He has cancelled the cath, but he needs a note to return to work.

## 2013-02-11 NOTE — Telephone Encounter (Signed)
PATIENT HAS DECIDED TO HAVE PROCEDURE DONE.

## 2013-02-12 ENCOUNTER — Encounter (HOSPITAL_COMMUNITY)
Admission: EM | Disposition: A | Discharge status: Home / Self Care | Payer: Self-pay | Source: Home / Self Care | Attending: Cardiology

## 2013-02-12 ENCOUNTER — Encounter (HOSPITAL_COMMUNITY): Payer: Self-pay | Admitting: Emergency Medicine

## 2013-02-12 ENCOUNTER — Emergency Department (HOSPITAL_COMMUNITY): Payer: BC Managed Care – PPO

## 2013-02-12 ENCOUNTER — Ambulatory Visit (HOSPITAL_COMMUNITY): Admit: 2013-02-12 | Payer: Self-pay | Admitting: Cardiovascular Disease

## 2013-02-12 ENCOUNTER — Other Ambulatory Visit: Payer: Self-pay

## 2013-02-12 ENCOUNTER — Inpatient Hospital Stay (HOSPITAL_COMMUNITY)
Admission: EM | Admit: 2013-02-12 | Discharge: 2013-02-16 | DRG: 247 | Disposition: A | Discharge status: Home / Self Care | Payer: BC Managed Care – PPO | Attending: Cardiology | Admitting: Cardiology

## 2013-02-12 DIAGNOSIS — I214 Non-ST elevation (NSTEMI) myocardial infarction: Secondary | ICD-10-CM | POA: Diagnosis present

## 2013-02-12 DIAGNOSIS — I498 Other specified cardiac arrhythmias: Secondary | ICD-10-CM | POA: Diagnosis present

## 2013-02-12 DIAGNOSIS — E785 Hyperlipidemia, unspecified: Secondary | ICD-10-CM | POA: Diagnosis present

## 2013-02-12 DIAGNOSIS — Z01818 Encounter for other preprocedural examination: Secondary | ICD-10-CM

## 2013-02-12 DIAGNOSIS — I480 Paroxysmal atrial fibrillation: Secondary | ICD-10-CM | POA: Diagnosis present

## 2013-02-12 DIAGNOSIS — I4891 Unspecified atrial fibrillation: Secondary | ICD-10-CM

## 2013-02-12 DIAGNOSIS — Z720 Tobacco use: Secondary | ICD-10-CM

## 2013-02-12 DIAGNOSIS — G8929 Other chronic pain: Secondary | ICD-10-CM | POA: Diagnosis present

## 2013-02-12 DIAGNOSIS — I2582 Chronic total occlusion of coronary artery: Secondary | ICD-10-CM | POA: Diagnosis present

## 2013-02-12 DIAGNOSIS — K5900 Constipation, unspecified: Secondary | ICD-10-CM | POA: Diagnosis not present

## 2013-02-12 DIAGNOSIS — I208 Other forms of angina pectoris: Secondary | ICD-10-CM

## 2013-02-12 DIAGNOSIS — I219 Acute myocardial infarction, unspecified: Secondary | ICD-10-CM

## 2013-02-12 DIAGNOSIS — I200 Unstable angina: Secondary | ICD-10-CM | POA: Diagnosis present

## 2013-02-12 DIAGNOSIS — Z8249 Family history of ischemic heart disease and other diseases of the circulatory system: Secondary | ICD-10-CM

## 2013-02-12 DIAGNOSIS — I2510 Atherosclerotic heart disease of native coronary artery without angina pectoris: Secondary | ICD-10-CM

## 2013-02-12 DIAGNOSIS — I442 Atrioventricular block, complete: Secondary | ICD-10-CM | POA: Diagnosis present

## 2013-02-12 DIAGNOSIS — F17200 Nicotine dependence, unspecified, uncomplicated: Secondary | ICD-10-CM | POA: Diagnosis present

## 2013-02-12 DIAGNOSIS — Z9861 Coronary angioplasty status: Secondary | ICD-10-CM

## 2013-02-12 DIAGNOSIS — I2119 ST elevation (STEMI) myocardial infarction involving other coronary artery of inferior wall: Principal | ICD-10-CM | POA: Diagnosis present

## 2013-02-12 DIAGNOSIS — M549 Dorsalgia, unspecified: Secondary | ICD-10-CM | POA: Diagnosis present

## 2013-02-12 DIAGNOSIS — G4730 Sleep apnea, unspecified: Secondary | ICD-10-CM | POA: Diagnosis present

## 2013-02-12 HISTORY — DX: Calculus of kidney: N20.0

## 2013-02-12 HISTORY — DX: Acute myocardial infarction, unspecified: I21.9

## 2013-02-12 HISTORY — PX: CORONARY ANGIOPLASTY WITH STENT PLACEMENT: SHX49

## 2013-02-12 HISTORY — PX: LEFT HEART CATHETERIZATION WITH CORONARY ANGIOGRAM: SHX5451

## 2013-02-12 HISTORY — PX: PERCUTANEOUS CORONARY STENT INTERVENTION (PCI-S): SHX5485

## 2013-02-12 HISTORY — DX: Atherosclerotic heart disease of native coronary artery without angina pectoris: I25.10

## 2013-02-12 HISTORY — DX: Hyperlipidemia, unspecified: E78.5

## 2013-02-12 LAB — MRSA PCR SCREENING: MRSA BY PCR: NEGATIVE

## 2013-02-12 LAB — PROTIME-INR
INR: 2.32 — ABNORMAL HIGH (ref 0.00–1.49)
INR: 1.11 (ref 0.00–1.49)
PROTHROMBIN TIME: 14.1 s (ref 11.6–15.2)
Prothrombin Time: 24.7 seconds — ABNORMAL HIGH (ref 11.6–15.2)

## 2013-02-12 LAB — GLUCOSE, CAPILLARY: Glucose-Capillary: 101 mg/dL — ABNORMAL HIGH (ref 70–99)

## 2013-02-12 LAB — CBC
HCT: 48.6 % (ref 39.0–52.0)
HEMOGLOBIN: 16.9 g/dL (ref 13.0–17.0)
MCH: 32.8 pg (ref 26.0–34.0)
MCHC: 34.8 g/dL (ref 30.0–36.0)
MCV: 94.2 fL (ref 78.0–100.0)
Platelets: 176 10*3/uL (ref 150–400)
RBC: 5.16 MIL/uL (ref 4.22–5.81)
RDW: 13.6 % (ref 11.5–15.5)
WBC: 11.8 10*3/uL — ABNORMAL HIGH (ref 4.0–10.5)

## 2013-02-12 LAB — POCT I-STAT TROPONIN I
Troponin i, poc: 0.03 ng/mL (ref 0.00–0.08)
Troponin i, poc: 0.17 ng/mL (ref 0.00–0.08)

## 2013-02-12 LAB — BASIC METABOLIC PANEL
BUN: 20 mg/dL (ref 6–23)
CO2: 21 mEq/L (ref 19–32)
Calcium: 9.4 mg/dL (ref 8.4–10.5)
Chloride: 106 mEq/L (ref 96–112)
Creatinine, Ser: 0.93 mg/dL (ref 0.50–1.35)
GFR calc Af Amer: >90 mL/min (ref >90)
Glucose, Bld: 125 mg/dL — ABNORMAL HIGH (ref 70–99)
POTASSIUM: 4.5 meq/L (ref 3.7–5.3)
SODIUM: 143 meq/L (ref 137–147)

## 2013-02-12 LAB — APTT: aPTT: 68 seconds — ABNORMAL HIGH (ref 24–37)

## 2013-02-12 LAB — TROPONIN I: Troponin I: 15.96 ng/mL (ref <0.30)

## 2013-02-12 SURGERY — LEFT HEART CATHETERIZATION WITH CORONARY ANGIOGRAM
Anesthesia: LOCAL

## 2013-02-12 MED ORDER — HEPARIN (PORCINE) IN NACL 100-0.45 UNIT/ML-% IJ SOLN
1200.0000 [IU]/h | INTRAMUSCULAR | Status: DC
  Administered 2013-02-12: 1200 [IU]/h via INTRAVENOUS
  Filled 2013-02-12: qty 250

## 2013-02-12 MED ORDER — MIDAZOLAM HCL 2 MG/2ML IJ SOLN
INTRAMUSCULAR | Status: AC
  Filled 2013-02-12: qty 2

## 2013-02-12 MED ORDER — ASPIRIN EC 81 MG PO TBEC
81.0000 mg | DELAYED_RELEASE_TABLET | Freq: Every day | ORAL | Status: DC
  Administered 2013-02-12 – 2013-02-14 (×3): 81 mg via ORAL
  Filled 2013-02-12 (×3): qty 1

## 2013-02-12 MED ORDER — ONDANSETRON HCL 4 MG/2ML IJ SOLN: 4.0000 mg | Freq: Four times a day (QID) | INTRAMUSCULAR | Status: DC | PRN

## 2013-02-12 MED ORDER — HEPARIN (PORCINE) IN NACL 2-0.9 UNIT/ML-% IJ SOLN
INTRAMUSCULAR | Status: AC
Start: 2013-02-12 — End: 2013-02-12
  Filled 2013-02-12: qty 1500

## 2013-02-12 MED ORDER — ATROPINE SULFATE 0.1 MG/ML IJ SOLN
INTRAMUSCULAR | Status: AC
  Filled 2013-02-12: qty 10

## 2013-02-12 MED ORDER — ASPIRIN 81 MG PO CHEW
81.0000 mg | CHEWABLE_TABLET | ORAL | Status: AC
  Filled 2013-02-12: qty 1

## 2013-02-12 MED ORDER — HEPARIN BOLUS VIA INFUSION
4000.0000 [IU] | Freq: Once | INTRAVENOUS | Status: DC
  Filled 2013-02-12: qty 4000

## 2013-02-12 MED ORDER — METOPROLOL TARTRATE 25 MG PO TABS
25.0000 mg | ORAL_TABLET | Freq: Three times a day (TID) | ORAL | Status: DC
  Administered 2013-02-13: 25 mg via ORAL
  Filled 2013-02-12 (×4): qty 1

## 2013-02-12 MED ORDER — METOPROLOL TARTRATE 25 MG PO TABS: 12.5000 mg | ORAL_TABLET | Freq: Two times a day (BID) | ORAL | Status: DC

## 2013-02-12 MED ORDER — BIVALIRUDIN 250 MG IV SOLR
INTRAVENOUS | Status: AC
  Filled 2013-02-12: qty 250

## 2013-02-12 MED ORDER — ACETAMINOPHEN 325 MG PO TABS: 650.0000 mg | ORAL_TABLET | ORAL | Status: DC | PRN

## 2013-02-12 MED ORDER — ZOLPIDEM TARTRATE 5 MG PO TABS
5.0000 mg | ORAL_TABLET | Freq: Every evening | ORAL | Status: DC | PRN
  Administered 2013-02-12: 5 mg via ORAL
  Filled 2013-02-12: qty 1

## 2013-02-12 MED ORDER — SODIUM CHLORIDE 0.9 % IV SOLN
INTRAVENOUS | Status: DC
  Administered 2013-02-12: 75 mL/h via INTRAVENOUS

## 2013-02-12 MED ORDER — NITROGLYCERIN 0.4 MG SL SUBL
0.4000 mg | SUBLINGUAL_TABLET | SUBLINGUAL | Status: DC | PRN
  Administered 2013-02-12 (×3): 0.4 mg via SUBLINGUAL
  Filled 2013-02-12: qty 25

## 2013-02-12 MED ORDER — ATORVASTATIN CALCIUM 40 MG PO TABS: 40.0000 mg | ORAL_TABLET | Freq: Every day | ORAL | Status: DC

## 2013-02-12 MED ORDER — SODIUM CHLORIDE 0.9 % IV SOLN
0.2500 mg/kg/h | INTRAVENOUS | Status: AC
  Filled 2013-02-12: qty 250

## 2013-02-12 MED ORDER — HEPARIN SODIUM (PORCINE) 5000 UNIT/ML IJ SOLN
INTRAMUSCULAR | Status: AC
  Administered 2013-02-12: 4000 [IU] via INTRAVENOUS
  Filled 2013-02-12: qty 1

## 2013-02-12 MED ORDER — LIDOCAINE HCL (PF) 1 % IJ SOLN
INTRAMUSCULAR | Status: AC
  Filled 2013-02-12: qty 30

## 2013-02-12 MED ORDER — HEPARIN (PORCINE) IN NACL 100-0.45 UNIT/ML-% IJ SOLN
1200.0000 [IU]/h | INTRAMUSCULAR | Status: DC
  Administered 2013-02-13: 1200 [IU]/h via INTRAVENOUS
  Filled 2013-02-12 (×5): qty 250

## 2013-02-12 MED ORDER — TICAGRELOR 90 MG PO TABS
90.0000 mg | ORAL_TABLET | Freq: Two times a day (BID) | ORAL | Status: DC
  Administered 2013-02-12 – 2013-02-14 (×4): 90 mg via ORAL
  Filled 2013-02-12 (×5): qty 1

## 2013-02-12 MED ORDER — METOPROLOL TARTRATE 25 MG PO TABS: 25.0000 mg | ORAL_TABLET | Freq: Three times a day (TID) | ORAL | Status: DC

## 2013-02-12 MED ORDER — ATORVASTATIN CALCIUM 80 MG PO TABS
80.0000 mg | ORAL_TABLET | Freq: Every day | ORAL | Status: DC
  Administered 2013-02-12 – 2013-02-15 (×4): 80 mg via ORAL
  Filled 2013-02-12 (×6): qty 1

## 2013-02-12 MED ORDER — ASPIRIN EC 81 MG PO TBEC: 81.0000 mg | DELAYED_RELEASE_TABLET | Freq: Every day | ORAL | Status: DC

## 2013-02-12 MED ORDER — SODIUM CHLORIDE 0.9 % IV SOLN: 0.2500 mg/kg/h | INTRAVENOUS | Status: DC

## 2013-02-12 MED ORDER — NITROGLYCERIN 0.2 MG/ML ON CALL CATH LAB
INTRAVENOUS | Status: AC
  Filled 2013-02-12: qty 1

## 2013-02-12 MED ORDER — SODIUM CHLORIDE 0.9 % IV SOLN
0.2500 mg/kg/h | INTRAVENOUS | Status: AC
  Administered 2013-02-12: 0.25 mg/kg/h via INTRAVENOUS

## 2013-02-12 MED ORDER — METOPROLOL TARTRATE 1 MG/ML IV SOLN
INTRAVENOUS | Status: AC
  Filled 2013-02-12: qty 5

## 2013-02-12 MED ORDER — HEPARIN SODIUM (PORCINE) 5000 UNIT/ML IJ SOLN
4000.0000 [IU] | Freq: Once | INTRAMUSCULAR | Status: AC
  Administered 2013-02-12: 4000 [IU] via INTRAVENOUS

## 2013-02-12 MED ORDER — ALPRAZOLAM 0.25 MG PO TABS
0.2500 mg | ORAL_TABLET | Freq: Two times a day (BID) | ORAL | Status: DC | PRN
  Administered 2013-02-16: 06:00:00 0.25 mg via ORAL
  Filled 2013-02-12: qty 1

## 2013-02-12 MED ORDER — ATROPINE SULFATE 0.1 MG/ML IJ SOLN
INTRAMUSCULAR | Status: AC
  Administered 2013-02-12: 1 mg
  Filled 2013-02-12: qty 10

## 2013-02-12 MED ORDER — SODIUM CHLORIDE 0.9 % IJ SOLN: 3.0000 mL | INTRAMUSCULAR | Status: DC | PRN

## 2013-02-12 MED ORDER — FENTANYL CITRATE 0.05 MG/ML IJ SOLN
INTRAMUSCULAR | Status: AC
Start: 2013-02-12 — End: 2013-02-12
  Filled 2013-02-12: qty 2

## 2013-02-12 MED ORDER — ATROPINE SULFATE 0.4 MG/ML IJ SOLN: 0.4000 mg | Freq: Once | INTRAMUSCULAR | Status: DC

## 2013-02-12 MED ORDER — TICAGRELOR 90 MG PO TABS
ORAL_TABLET | ORAL | Status: AC
  Filled 2013-02-12: qty 2

## 2013-02-12 MED ORDER — SODIUM CHLORIDE 0.9 % IV SOLN: INTRAVENOUS | Status: DC

## 2013-02-12 MED ORDER — NITROGLYCERIN 0.4 MG SL SUBL
0.4000 mg | SUBLINGUAL_TABLET | SUBLINGUAL | Status: DC | PRN
Start: 2013-02-12 — End: 2013-02-16

## 2013-02-12 MED ORDER — PANTOPRAZOLE SODIUM 40 MG PO TBEC
40.0000 mg | DELAYED_RELEASE_TABLET | Freq: Every day | ORAL | Status: DC
  Administered 2013-02-13 – 2013-02-16 (×4): 40 mg via ORAL
  Filled 2013-02-12 (×4): qty 1

## 2013-02-12 MED ORDER — NITROGLYCERIN IN D5W 200-5 MCG/ML-% IV SOLN
5.0000 ug/min | INTRAVENOUS | Status: DC
  Filled 2013-02-12: qty 250

## 2013-02-12 NOTE — CV Procedure (Signed)
Paul Todd is a 53 y.o. male    [id number]  [id number] LOCATION:  FACILITY: [facility]  PHYSICIAN: Troy Sine, MD, [facility] Aug 12, 1960   DATE OF PROCEDURE:  02/12/2013    CARDIAC CATHETERIZATION     HISTORY: Mr. Clarence Cogswell is a 53 year old male who has recently developed some intermittent chest pain. A recent Myoview study was abnormal with an ejection fraction of 48% without definitive ischemia. The patient was scheduled to have an outpatient cardiac catheterization in the upcoming week. This morning he was awakened from sleep at around 6:45 AM with substernal chest tightness associated with diaphoresis shortness of breath and jaw radiation. He presented to the emergency room. His ECG were showing evolving ST segment changes. In the emergency room he developed bradycardia with heart rates in the 40H and systolic blood pressure dropped to the 50s and he developed transient heart block and was given atropine. He is now taken acutely to the cardiac catheterization laboratory.   PROCEDURE:  The patient was brought to the second floor [facility]iac cath lab in the postabsorptive state. Upon arrival to the catheterization laboratory the patient was still having 3/10 chest pain which was improved from earlier when it was 10/10. He was given 4000 units of heparin and 325 mg aspirinin the emergency room prior to transfer to the cath lab. He was prepped and draped in sterile fashion. Versed 2 mg and fentanyl 50 mcg were ministered for conscious sedation. His right femoral artery was punctured anteriorly and a 6 French sheath was inserted without difficulty. Catheterization was done with 5 [place] SL 4 diagnostic catheter and with a high suspicion of total RCA occlusion a 6 French right guiding catheter with side holes was used for selective angiography right renal artery. This confirmed total RCA occlusion. Angiomax bolus plus infusion was administered and patient received brilinta 180 mg orally.  An Asahi medium wire was advanced down the right cardiography and was able to cross the total occlusion. Reperfusion, the patient became markedly bradycardic and dropped his heart rate to 38. He was treated with atropine 1 mg. There was diffuse stenoses beyond the occlusion. Initially, a 2.0x12 mm Emerge balloon was used for initial dilatation. Due to the diffuse RCA disease a 2.5x25 mm Trek balloon was then inserted and 2 inflations at 8 and 9 atmospheres were performed. It was felt that he would need to be a long area of stenting to cover the diffuse stenosis. A 3.0x38 mm size Xience Alpine DES stent was then inserted and advanced to the distal RCA before the PDA takeoff and was  positioned where the vessel seemed to normalize. This was dilated x2 at 10 and 12 atmospheres. A second Xience Alpine DES 3.0x38 mm stent was then inserted a tandem fashion proximally and extended to the proximal RCA to cover the entire stenosed region. This was dilated x2 at 13 and 14 atmospheres. The patient at this time developed rapid atrial fibrillation with a rate in the 140s.  IV Lopressor at 2.5 mg intervals for a total of 4 injections (total dose 10 mg ) were made with improvement in the ventricular rate into the 90s. An [place] Emerge  3.25x30 mm balloon was used for post stent dilatation with stent taper from 3.26 proximally to 3.21 mm distally. The vessel is a large caliber vessel and there was brisk TIMI-3 flow in the diffuse stenoses being reduced to 0%. The wire balloon and guiding catheters were then removed and a 5 French pigtail catheter  was inserted and advanced into the left ventricle. RAO left ventriculography was performed and the catheter was then pulled back successfully into the central aorta. The patient at this time had stable hemodynamics with a heart rate in the 80s to 90s in atrial fibrillation and with adequate blood pressure. His chest pain had almost completely resolved. Arterial sheath was sutured in place  with plans for continuation of Angiomax infusion for 4 hours post this acute intervention. He was transported to the coronary care unit in stable condition.   HEMODYNAMICS:   Central Aorta: 100/69   Left Ventricle: 100/12/20  ANGIOGRAPHY:  1. Left main: Moderate size vessel that trifurcated into the LAD, a ramus intermediate vessel, and a left circumflex coronary artery. There was smooth 30 - 40% distal taper of the left main. 2. LAD: Moderate size vessel that had an 85% somewhat eccentric very proximal stenosis in the region of the first septal and diagonal vessel. The LAD extended to the left ventricular apex. 3. Ramus Intermediate: Angiographically normal vessel 4. Left circumflex: Angiographically normal vessel with mild luminal irregularity in the region of the trifurcation of a obtuse marginal branch, AV groove circumflex, and left atrial branch..  5. Right coronary artery: Moderate size vessel that was totally occluded in the proximal to mid segment. There was evidence for left-to-right collaterals.  Following emergent percutaneous cardiac intervention to the totally occluded right coronary artery with PTCA initially and then with demonstration of diffuse disease proximal to and extending significantly beyond the site of total occlusion, the region was stented with tandem 3.0x38 mm Xience Alpine DES stents postdilated with a taper of 3.26-3.21 mm and with restoration of TIMI 0 to TIMI 3 flow and with the entire region being reduced to 0%.   Left ventriculography revealed an ejection fraction of approximately 50%. There was evidence for mild mid and distal inferior hypocontractility.   IMPRESSION:  Low normal to mildly impaired LV dysfunction with an ejection fraction of approximately 50% and evidence for mid distal inferior hypocontractility.  Acute Coronary syndrome secondary to total occlusion of the proximal to midright coronary artery initial TIMI 0 flow.  Multivessel CAD with  evidence for 30-40% smooth narrowing in the distal left main, an 85% eccentric proximal LAD stenosis in addition to the acute infarct related totally occluded right coronary artery.  Reperfusion induced marked bradycardia to a heart rate of 38 treated with atropine.  Successful percutaneous coronary intervention of the totally occluded) artery, and once opened was for diffuse stenoses of 90 and 80% proximal insertion of tandem 3.0x38 mm Xience Alpine DES stents postdilated with a stent taper from 3.26 proximally to 3.21 one distally with TIMI 0 flow initially been restored to TIMI 3 flow in the entire region being reduced to 0%.  Atrial fibrillation with rapid ventricular response treated with IV Lopressor 2.5 mg x4 for a total dose of 10 mg.  Anticoagulation with Angiomax and antiplatelet therapy with brilinta 180 mg/ASA and IC nitroglycerin  RECOMMENDATION:  Mr. Zarazua presented today with acute coronary syndrome with an initial troponin  negative but the second troponin was positive at 0.17 and he developed evolutionary inferolateral ST segment changes. Catheterization reveals an acutely occluded right coronary artery which was successfully treated with PTCA and diffuse stenting due to diffuse RCA stenoses beyond the point of total occlusion. The patient does have high-grade concomitant proximal LAD disease and ultimately will need to be staged PCI to his LAD. The patient currently is in atrial fibrillation with a controlled  rate after IV beta blocker therapy. Following Angiomax infusion and sheath pull in he will be started on heparin therapy as well as additional medical therapy. He was counseled on the importance of smoking cessation.  Troy Sine, MD, [facility] 02/12/2013 4:08 pm

## 2013-02-12 NOTE — Progress Notes (Signed)
CRITICAL VALUE ALERT  Critical value received:  Troponin 15.95  Date of notification:  02/12/2013  Time of notification:  8295  Critical value read back: yes  Nurse who received alert:  Duanne Moron RN  Expected lab value, MD aware. Pt had stent placed today at cath lab.

## 2013-02-12 NOTE — Progress Notes (Signed)
ANTICOAGULATION CONSULT NOTE - Initial Consult  Pharmacy Consult for heparin Indication: chest pain/ACS  No Known Allergies  Patient Measurements: Height: 5\' 7"  (170.2 cm) Weight: 225 lb (102.059 kg) IBW/kg (Calculated) : 66.1 Heparin Dosing Weight: 80kg  Vital Signs: Temp: 98.8 F (37.1 C) (02/07 0740) Temp src: Oral (02/07 0740) BP: 130/69 mmHg (02/07 0952) Pulse Rate: 56 (02/07 0952)  Labs:  Recent Labs  02/12/13 0757  HGB 16.9  HCT 48.6  PLT 176  CREATININE 0.93    Estimated Creatinine Clearance: 104.6 ml/min (by C-G formula based on Cr of 0.93).   Medical History: Past Medical History  Diagnosis Date  . Nephrolithiasis   . PAF (paroxysmal atrial fibrillation)     during a kidney stone attack  . Dyslipidemia    Assessment: 53 year old male presents to [facility] with chest pain. Abnormal echo on 02/02/13. Orders to start IV heparin. CBC within normal limits, not on anticoagulants prior to admission.  Goal of Therapy:  Heparin level 0.3-0.7 units/ml Monitor platelets by anticoagulation protocol: Yes   Plan:  Give 4000 units bolus x 1 Start heparin infusion at 1200 units/hr Check anti-Xa level in 6 hours and daily while on heparin Continue to monitor H&H and platelets  Erin Hearing PharmD., BCPS Clinical Pharmacist Pager [phone] 02/12/2013 12:39 PM

## 2013-02-12 NOTE — ED Provider Notes (Signed)
CSN: [id number]     Arrival date & time 02/12/13  0726 History   First MD Initiated Contact with Patient 02/12/13 0755     No chief complaint on file.  (Consider location/radiation/quality/duration/timing/severity/associated sxs/prior Treatment) The history is provided by the patient.   Paul Todd is a 53 y.o. male who presents for evaluation of chest pain. The chest pain awakened him at 7 AM this morning, and has been persistent since that time. The pain is dull in character and felt in the center of the chest and radiates to the right arm and right jaw. He took aspirin at 7 AM. He has not eaten or drank anything. He complains of diaphoresis and shortness of breath associated with the pain today. He slept well during the night. He has had some intermittent pain similar to this in the last 36 hours. He is being evaluated by his cardiologist and plans to have a cardiac catheterization in 3 days. He had a sleep study done on 02/02/2013, and was told it was abnormal. He has a secondary test planned, to evaluate for apnea, when wearing a mask. He is taking his usual medications, without relief. There are no other known modifying factors.   Past Medical History  Diagnosis Date  . Chronic kidney disease     patient denies   History reviewed. No pertinent past surgical history. Family History  Problem Relation Age of Onset  . Heart disease Mother   . Diabetes Mother   . Diabetes Father    History  Substance Use Topics  . Smoking status: Current Every Day Smoker -- 0.50 packs/day for 35 years    Types: Cigarettes  . Smokeless tobacco: Never Used  . Alcohol Use: Yes    Review of Systems  All other systems reviewed and are negative.    Allergies  Review of patient's allergies indicates no known allergies.  Home Medications   Current Outpatient Rx  Name  Route  Sig  Dispense  Refill  . atorvastatin (LIPITOR) 40 MG tablet   Oral   Take 1 tablet (40 mg total) by mouth daily.  90 tablet   3   . naproxen sodium (ALEVE) 220 MG tablet   Oral   Take 440 mg by mouth daily as needed (for pain).          Marland Kitchen omeprazole (PRILOSEC OTC) 20 MG tablet   Oral   Take 20 mg by mouth daily.         Marland Kitchen aspirin 325 MG tablet   Oral   Take 325 mg by mouth daily.          BP 130/69  Pulse 56  Temp(Src) 98.8 F (37.1 C) (Oral)  Resp 19  Ht 5\' 7"  (1.702 m)  Wt 225 lb (102.059 kg)  BMI 35.23 kg/m2  SpO2 99% Physical Exam  Nursing note and vitals reviewed. Constitutional: He is oriented to person, place, and time. He appears well-developed and well-nourished.  HENT:  Head: Normocephalic and atraumatic.  Right Ear: External ear normal.  Left Ear: External ear normal.  Eyes: Conjunctivae and EOM are normal. Pupils are equal, round, and reactive to light.  Neck: Normal range of motion and phonation normal. Neck supple.  Cardiovascular: Normal rate, regular rhythm, normal heart sounds and intact distal pulses.   Pulmonary/Chest: Effort normal and breath sounds normal. No respiratory distress. He has no wheezes. He has no rales. He exhibits no tenderness and no bony tenderness.  Abdominal: Soft. Normal appearance.  There is no tenderness.  Musculoskeletal: Normal range of motion.  Neurological: He is alert and oriented to person, place, and time. No cranial nerve deficit or sensory deficit. He exhibits normal muscle tone. Coordination normal.  Skin: Skin is warm, dry and intact. No rash noted. No erythema.  Psychiatric: His behavior is normal. Judgment and thought content normal.  He is anxious    ED Course  Procedures (including critical care time) Medications  nitroGLYCERIN (NITROSTAT) SL tablet 0.4 mg (0.4 mg Sublingual Given 02/12/13 0829)    Patient Vitals for the past 24 hrs:  BP Temp Temp src Pulse Resp SpO2 Height Weight  02/12/13 0952 130/69 mmHg - - 56 19 99 % - -  02/12/13 0830 116/75 mmHg - - 61 15 97 % - -  02/12/13 0819 155/91 mmHg - - 58 13 99 % - -   02/12/13 0815 155/91 mmHg - - 57 20 100 % - -  02/12/13 0800 148/86 mmHg - - 53 16 100 % - -  02/12/13 0742 - - - - - - 5\' 7"  (1.702 m) 225 lb (102.059 kg)  02/12/13 0740 152/84 mmHg 98.8 F (37.1 C) Oral 62 18 100 % - -  02/12/13 0730 177/93 mmHg 97.3 F (36.3 C) Oral 60 24 95 % 5\' 7"  (1.702 m) 225 lb (102.059 kg)   0175- discuss with cardiology, who recommends a second troponin.  10:00AM Reevaluation with update and discussion. After initial assessment and treatment, an updated evaluation reveals his CP is 1/10 now. Last NTG 45 min ago. Burgundy Matuszak L   11:35- second troponin elevated compared to first indicating NSTEMI. I discussed the finding with the patient. He is still having 1/10 chest discomfort.   11:37 AM-Consult complete with. Patient case explained and discussed. Dr. Aundra Dubin agrees to admit patient for further evaluation and treatment. Call ended at [place] Performed by: Richarda Blade Total critical care time: 40 min Critical care time was exclusive of separately billable procedures and treating other patients. Critical care was necessary to treat or prevent imminent or life-threatening deterioration. Critical care was time spent personally by me on the following activities: development of treatment plan with patient and/or surrogate as well as nursing, discussions with consultants, evaluation of patient's response to treatment, examination of patient, obtaining history from patient or surrogate, ordering and performing treatments and interventions, ordering and review of laboratory studies, ordering and review of radiographic studies, pulse oximetry and re-evaluation of patient's condition.  Labs Review Labs Reviewed  CBC - Abnormal; Notable for the following:    WBC 11.8 (*)    All other components within normal limits  BASIC METABOLIC PANEL - Abnormal; Notable for the following:    Glucose, Bld 125 (*)    All other components within normal limits  POCT  I-STAT TROPONIN I - Abnormal; Notable for the following:    Troponin i, poc 0.17 (*)    All other components within normal limits  POCT I-STAT TROPONIN I   Imaging Review Dg Chest 2 View  02/12/2013   CLINICAL DATA:  Substernal chest pain  EXAM: CHEST  2 VIEW  COMPARISON:  02/10/2013  FINDINGS: The heart size and mediastinal contours are within normal limits. Both lungs are clear. The visualized skeletal structures are unremarkable.  IMPRESSION: No active cardiopulmonary disease.   Electronically Signed   By: Inez Catalina M.D.   On: 02/12/2013 09:18    EKG Interpretation    Date/Time:  Saturday February 12 2013 07:30:35  EST Ventricular Rate:  61 PR Interval:  174 QRS Duration: 96 QT Interval:  406 QTC Calculation: 408 R Axis:   80 Text Interpretation:  Normal sinus rhythm Septal infarct , age undetermined Abnormal ECG No old tracing to compare Confirmed by [facility]  MD, Terryl Molinelli ([id number]) on 02/12/2013 7:55:37 AM            MDM   1. Angina at rest   2. Sleep apnea   3. NSTEMI (non-ST elevated myocardial infarction)     Nonspecific chest pain, with history of anginal type symptoms, and likely sleep apnea. He does not have a known cardiomyopathy. He has a pending cardiac catheterization in 4 days. No evidence for cardiac infarct, heart failure, or metabolic instability.  Nursing Notes Reviewed/ Care Coordinated, and agree without changes. Applicable Imaging Reviewed. Radiologic imaging report reviewed and images by radiography  - viewed, by me. Interpretation of Laboratory Data incorporated into ED treatment   Plan: Admit to cardiology  Richarda Blade, MD 02/12/13 [phone]

## 2013-02-12 NOTE — H&P (Signed)
Patient ID: Paul Todd MRN: [id number], DOB/AGE: 09/21/1960   Admit date: 02/12/2013   Primary Physician: [name], MD Primary Cardiologist: Dr Debara Pickett  HPI:   53 y/o married male being followed by Dr Debara Pickett. The pt has had chest pain and arm pain as an OP. A Myoview done  02/02/13 was abnormal with an EF of 48%, there was no ischemia. He was to have an OP cath next week. This morning around 6:45 he woke up with SSCP "tightness" associated with diaphoresis, SOB, and radiation to his jaw and Rt arm. He lives close to the hospital and had his wife drive him to the ER. He took one adult aspirin before leaving home. His symptoms are improved after NTG x 3 though he still has some residual tightness. His second Troponin is 0.17. He is admitted now for further evaluation.   Problem List: Past Medical History  Diagnosis Date  . Nephrolithiasis   . PAF (paroxysmal atrial fibrillation)     during a kidney stone attack  . Dyslipidemia     History reviewed. No pertinent past surgical history.   Allergies: No Known Allergies   Home Medications Current Facility-Administered Medications  Medication Dose Route Frequency Provider Last Rate Last Dose  . nitroGLYCERIN (NITROSTAT) SL tablet 0.4 mg  0.4 mg Sublingual Q5 min PRN Richarda Blade, MD   0.4 mg at 02/12/13 6301   Current Outpatient Prescriptions  Medication Sig Dispense Refill  . atorvastatin (LIPITOR) 40 MG tablet Take 1 tablet (40 mg total) by mouth daily.  90 tablet  3  . naproxen sodium (ALEVE) 220 MG tablet Take 440 mg by mouth daily as needed (for pain).       Marland Kitchen omeprazole (PRILOSEC OTC) 20 MG tablet Take 20 mg by mouth daily.      Marland Kitchen aspirin 325 MG tablet Take 325 mg by mouth daily.         Family History  Problem Relation Age of Onset  . Heart disease Mother   . Diabetes Mother   . Diabetes Father      History   Social History  . Marital Status: Divorced    Spouse Name: N/A    Number of Children: N/A  . Years of  Education: N/A   Occupational History  . Not on file.   Social History Main Topics  . Smoking status: Current Every Day Smoker -- 0.50 packs/day for 35 years    Types: Cigarettes  . Smokeless tobacco: Never Used  . Alcohol Use: Yes  . Drug Use: No  . Sexual Activity: Yes    Birth Control/ Protection: None   Other Topics Concern  . Not on file   Social History Narrative  . No narrative on file     Review of Systems: General: negative for chills, fever, night sweats or weight changes.  Cardiovascular: negative for edema, orthopnea, palpitations, paroxysmal nocturnal dyspnea or shortness of breath Dermatological: negative for rash Respiratory: negative for cough or wheezing Urologic: negative for hematuria Abdominal: negative for nausea, vomiting, diarrhea, bright red blood per rectum, melena, or hematemesis Neurologic: negative for visual changes, syncope, or dizziness All other systems reviewed and are otherwise negative except as noted above.  Physical Exam: Blood pressure 130/69, pulse 56, temperature 98.8 F (37.1 C), temperature source Oral, resp. rate 19, height 5\' 7"  (1.702 m), weight 225 lb (102.059 kg), SpO2 99.00%.  General appearance: alert, cooperative, no distress and moderately obese Neck: no carotid bruit and no JVD  Lungs: clear to auscultation bilaterally Heart: regular rate and rhythm Abdomen: obese Extremities: extremities normal, atraumatic, no cyanosis or edema Pulses: 2+ and symmetric Skin: pale cool dry Neurologic: Grossly normal    Labs:   Results for orders placed during the hospital encounter of 02/12/13 (from the past 24 hour(s))  CBC     Status: Abnormal   Collection Time    02/12/13  7:57 AM      Result Value Range   WBC 11.8 (*) 4.0 - 10.5 K/uL   RBC 5.16  4.22 - 5.81 MIL/uL   Hemoglobin 16.9  13.0 - 17.0 g/dL   HCT 48.6  39.0 - 52.0 %   MCV 94.2  78.0 - 100.0 fL   MCH 32.8  26.0 - 34.0 pg   MCHC 34.8  30.0 - 36.0 g/dL   RDW 13.6   11.5 - 15.5 %   Platelets 176  150 - 400 K/uL  BASIC METABOLIC PANEL     Status: Abnormal   Collection Time    02/12/13  7:57 AM      Result Value Range   Sodium 143  137 - 147 mEq/L   Potassium 4.5  3.7 - 5.3 mEq/L   Chloride 106  96 - 112 mEq/L   CO2 21  19 - 32 mEq/L   Glucose, Bld 125 (*) 70 - 99 mg/dL   BUN 20  6 - 23 mg/dL   Creatinine, Ser 0.93  0.50 - 1.35 mg/dL   Calcium 9.4  8.4 - 10.5 mg/dL   GFR calc non Af Amer >90  >90 mL/min   GFR calc Af Amer >90  >90 mL/min  POCT I-STAT TROPONIN I     Status: None   Collection Time    02/12/13  8:04 AM      Result Value Range   Troponin i, poc 0.03  0.00 - 0.08 ng/mL   Comment 3           POCT I-STAT TROPONIN I     Status: Abnormal   Collection Time    02/12/13 11:04 AM      Result Value Range   Troponin i, poc 0.17 (*) 0.00 - 0.08 ng/mL   Comment NOTIFIED PHYSICIAN     Comment 3              Radiology/Studies: Dg Chest 2 View  02/12/2013   CLINICAL DATA:  Substernal chest pain  EXAM: CHEST  2 VIEW  COMPARISON:  02/10/2013  FINDINGS: The heart size and mediastinal contours are within normal limits. Both lungs are clear. The visualized skeletal structures are unremarkable.  IMPRESSION: No active cardiopulmonary disease.   Electronically Signed   By: Inez Catalina M.D.   On: 02/12/2013 09:18     EKG: TWI in AVL, septal Qs  ASSESSMENT AND PLAN:  Principal Problem:   NSTEMI (non-ST elevated myocardial infarction) Active Problems:   Dyslipidemia   Family history of early CAD   Tobacco abuse   History of snoring- suspected sleep apnea   PAF (paroxysmal atrial fibrillation)   PLAN: Admit, Heparin, NTG, ? Integrelin. Cath Monday unless he has more pain this weekend.    Henri Medal, PA-C 02/12/2013, 12:14 PM  Patient seen with PA, agree with the above note.  Patient had 10/10 chest pain this morning, now down to 3/10.  He has nonspecific < 1 mm ST elevation inferiorly.   In the ER, patient developed nausea and  was noted to be bradycardic  with HR in the 30s, SBP dropped to the 50s.  ECG showed complete heart block but with a narrow escape.  I gave him atropine 0.5 mg with rise in HR to the 70s, NSR.    Assessment/Plan: 1. NSTEMI: Patient presents with NSTEMI, ongoing chest pain.  He had an episode as above that may be vagally mediated from nausea (versus effect of RCA-territory ischemia).  I think he needs to go ahead for [facility] today.  ECG shows < 1 mm inferior ST elevation.  TnI positive.   - Hold off on beta blocker with episode of CHB.  Hold off on NTG gtt as he is recovering from hypotension (with CHB).  - ASA/heparin gtt.  2. Complete heart block: Episode of CHB associated with nausea.  This may be vagally-mediated but cannot rule out effect of RCA ischemia.  Narrow complex escape.  Resolved quickly with atropine.  Needs definitive evaluation by cath today.   Loralie Champagne 02/12/2013 12:51 PM

## 2013-02-12 NOTE — ED Notes (Signed)
C/o substernal recurring cp radiating to right jaw and right arm started at rest this am when pt woke up, patient is scheduled for a cath on Tuesday.  Pt is feeling sob, diaphoretic, denies nausea.

## 2013-02-12 NOTE — Interval H&P Note (Signed)
Cath Lab Visit (complete for each Cath Lab visit)  Clinical Evaluation Leading to the Procedure:   ACS: yes  Non-ACS:    Anginal Classification: CCS IV  Anti-ischemic medical therapy: Maximal Therapy (2 or more classes of medications)  Non-Invasive Test Results: Low-risk stress test findings: cardiac mortality <1%/year  Prior CABG: No previous CABG      History and Physical Interval Note:  02/12/2013 3:37 PM  [name]  has presented today for surgery, with the diagnosis of STEMI  The various methods of treatment have been discussed with the patient and family. After consideration of risks, benefits and other options for treatment, the patient has consented to  Procedure(s) with comments: LEFT HEART CATHETERIZATION WITH CORONARY ANGIOGRAM (N/A) PERCUTANEOUS CORONARY STENT INTERVENTION (PCI-S) - STEMI as a surgical intervention .  The patient's history has been reviewed, patient examined, no change in status, stable for surgery.  I have reviewed the patient's chart and labs.  Questions were answered to the patient's satisfaction.     KELLY,THOMAS A

## 2013-02-12 NOTE — ED Notes (Signed)
Glasses, wedding band, wallet, and clothes given to patient's wife at bedside.

## 2013-02-12 NOTE — ED Notes (Signed)
PT reports CP decreased from 3/10 to 1/10 following NGT SL 0.4 series of 3 tabs.

## 2013-02-12 NOTE — Progress Notes (Signed)
ANTICOAGULATION CONSULT NOTE - Follow Up Consult  Pharmacy Consult for heparin Indication: chest pain/ACS  No Known Allergies  Patient Measurements: Height: 5\' 7"  (170.2 cm) Weight: 228 lb 2.8 oz (103.5 kg) IBW/kg (Calculated) : 66.1 Heparin Dosing Weight: 80 kg  Vital Signs: Temp: 98.1 F (36.7 C) (02/07 1947) Temp src: Oral (02/07 1947) BP: 97/62 mmHg (02/07 2200) Pulse Rate: 75 (02/07 2200)  Labs:  Recent Labs  02/12/13 0757 02/12/13 1245 02/12/13 1352 02/12/13 1825  HGB 16.9  --   --   --   HCT 48.6  --   --   --   PLT 176  --   --   --   APTT  --   --  68*  --   LABPROT  --  24.7* 14.1  --   INR  --  2.32* 1.11  --   CREATININE 0.93  --   --   --   TROPONINI  --   --   --  15.96*    Estimated Creatinine Clearance: 105.4 ml/min (by C-G formula based on Cr of 0.93).   Assessment: Patient is a 53 y.o. M s/p cath  And PCI to RCA with plan for staged PCI to LAD.  Angiomax was continued 4 hours post cath and was d/ced at 7 PM.  RN removed sheath at 10PM.  To resume heparin 6 hrs post sheath removal per Dr. Georgina Peer.  Goal of Therapy:  Heparin level 0.3-0.7 units/ml Monitor platelets by anticoagulation protocol: Yes   Plan:  1) resume heparin drip at 1200 units/hr at 0400 2) check heparin level at 10 AM (6 hr level)  Calynn Ferrero P 02/12/2013,10:24 PM

## 2013-02-12 NOTE — ED Notes (Signed)
Pt reported nausea and sweating. HR low at 39 and SBP 84 . DR Aundra Dubin at bed side to eval PT. Atropine given per VO DR Mclean.Code stemi called PER DR Mclean.

## 2013-02-13 ENCOUNTER — Encounter (HOSPITAL_COMMUNITY): Payer: Self-pay | Admitting: Internal Medicine

## 2013-02-13 DIAGNOSIS — I219 Acute myocardial infarction, unspecified: Secondary | ICD-10-CM

## 2013-02-13 DIAGNOSIS — I4891 Unspecified atrial fibrillation: Secondary | ICD-10-CM

## 2013-02-13 LAB — BASIC METABOLIC PANEL
BUN: 16 mg/dL (ref 6–23)
CO2: 22 mEq/L (ref 19–32)
CREATININE: 0.71 mg/dL (ref 0.50–1.35)
Calcium: 8.6 mg/dL (ref 8.4–10.5)
Chloride: 106 mEq/L (ref 96–112)
GFR calc non Af Amer: >90 mL/min (ref >90)
GLUCOSE: 137 mg/dL — AB (ref 70–99)
Potassium: 4.2 mEq/L (ref 3.7–5.3)
Sodium: 141 mEq/L (ref 137–147)

## 2013-02-13 LAB — CBC
HEMATOCRIT: 41.3 % (ref 39.0–52.0)
HEMOGLOBIN: 14.2 g/dL (ref 13.0–17.0)
MCH: 32.6 pg (ref 26.0–34.0)
MCHC: 34.4 g/dL (ref 30.0–36.0)
MCV: 94.9 fL (ref 78.0–100.0)
Platelets: 164 10*3/uL (ref 150–400)
RBC: 4.35 MIL/uL (ref 4.22–5.81)
RDW: 13.6 % (ref 11.5–15.5)
WBC: 13.6 10*3/uL — ABNORMAL HIGH (ref 4.0–10.5)

## 2013-02-13 LAB — MAGNESIUM: Magnesium: 1.9 mg/dL (ref 1.5–2.5)

## 2013-02-13 LAB — HEMOGLOBIN A1C
HEMOGLOBIN A1C: 5.8 % — AB (ref <5.7)
Mean Plasma Glucose: 120 mg/dL — ABNORMAL HIGH (ref <117)

## 2013-02-13 LAB — HEPARIN LEVEL (UNFRACTIONATED)
HEPARIN UNFRACTIONATED: 0.21 [IU]/mL — AB (ref 0.30–0.70)
Heparin Unfractionated: 0.53 IU/mL (ref 0.30–0.70)

## 2013-02-13 LAB — TROPONIN I: Troponin I: >20 ng/mL (ref <0.30)

## 2013-02-13 MED ORDER — SODIUM CHLORIDE 0.9 % IJ SOLN: 3.0000 mL | INTRAMUSCULAR | Status: DC | PRN

## 2013-02-13 MED ORDER — METOPROLOL SUCCINATE ER 25 MG PO TB24
25.0000 mg | ORAL_TABLET | Freq: Two times a day (BID) | ORAL | Status: DC
  Administered 2013-02-14 – 2013-02-16 (×5): 25 mg via ORAL
  Filled 2013-02-13 (×9): qty 1

## 2013-02-13 MED ORDER — ASPIRIN 81 MG PO CHEW: 81.0000 mg | CHEWABLE_TABLET | ORAL | Status: AC

## 2013-02-13 MED ORDER — SODIUM CHLORIDE 0.9 % IV SOLN: 250.0000 mL | INTRAVENOUS | Status: DC | PRN

## 2013-02-13 MED ORDER — SODIUM CHLORIDE 0.9 % IV SOLN: INTRAVENOUS | Status: DC

## 2013-02-13 MED ORDER — MAGNESIUM HYDROXIDE 400 MG/5ML PO SUSP
30.0000 mL | Freq: Every day | ORAL | Status: DC | PRN
  Administered 2013-02-13: 30 mL via ORAL
  Filled 2013-02-13: qty 30

## 2013-02-13 MED ORDER — SODIUM CHLORIDE 0.9 % IJ SOLN
3.0000 mL | Freq: Two times a day (BID) | INTRAMUSCULAR | Status: DC
  Administered 2013-02-14: 09:00:00 via INTRAVENOUS

## 2013-02-13 MED ORDER — SODIUM CHLORIDE 0.9 % IV SOLN: 1.0000 mL/kg/h | INTRAVENOUS | Status: DC

## 2013-02-13 MED ORDER — MAGNESIUM SULFATE 40 MG/ML IJ SOLN
2.0000 g | Freq: Once | INTRAMUSCULAR | Status: AC
  Administered 2013-02-13: 2 g via INTRAVENOUS
  Filled 2013-02-13: qty 50

## 2013-02-13 MED ORDER — SODIUM CHLORIDE 0.9 % IV BOLUS (SEPSIS)
500.0000 mL | Freq: Once | INTRAVENOUS | Status: AC
  Administered 2013-02-13: 500 mL via INTRAVENOUS

## 2013-02-13 NOTE — Progress Notes (Signed)
[facility] Daily Progress Note  Subjective: Denies chest pain or pain at cath site.  He had sob this am with exertion.  He does not have a PCP may establish at [facility]   Objective: Vital signs in last 24 hours: Filed Vitals:   02/13/13 0439 02/13/13 0500 02/13/13 0600 02/13/13 0700  BP:  114/66 123/66 108/73  Pulse:  74 87 87  Temp:      TempSrc:      Resp:  22 16 21   Height:      Weight: 227 lb 11.8 oz (103.3 kg)     SpO2:  97% 100% 99%   Weight change:   Intake/Output Summary (Last 24 hours) at 02/13/13 0725 Last data filed at 02/13/13 0600  Gross per 24 hour  Intake 1795.91 ml  Output   1475 ml  Net 320.91 ml   Vitals reviewed. General: resting in bed, NAD HEENT: [place]/at,  no scleral icterus Cardiac: AF, no rubs, murmurs or gallops Pulm: clear to auscultation bilaterally, no wheezes, rales, or rhonchi Abd: soft, nontender, nondistended, BS present, obese  Ext: warm and well perfused, no pedal edema Neuro: alert and oriented X3, cranial nerves II-XII grossly intact, moving all 4 extremities  Cath site: with bandage. No bleeding/bruit   Lab Results: Basic Metabolic Panel:  Recent Labs Lab 02/12/13 0757 02/13/13 0120  NA 143 141  K 4.5 4.2  CL 106 106  CO2 21 22  GLUCOSE 125* 137*  BUN 20 16  CREATININE 0.93 0.71  CALCIUM 9.4 8.6  MG  --  1.9   CBC:  Recent Labs Lab 02/12/13 0757 02/13/13 0120  WBC 11.8* 13.6*  HGB 16.9 14.2  HCT 48.6 41.3  MCV 94.2 94.9  PLT 176 164   Cardiac Enzymes:  Recent Labs Lab 02/12/13 1825 02/13/13 0026  TROPONINI 15.96* >20.00*   CBG:  Recent Labs Lab 02/12/13 1537  GLUCAP 101*   Hemoglobin A1C:-pending  No results found for this basename: HGBA1C,  in the last 168 hours  Fasting Lipid Panel: Lipid Panel     Component Value Date/Time   CHOL 203* 11/23/2012 0949   TRIG 206* 01/28/2013 0939   TRIG 229* 11/23/2012 0949   HDL 37* 11/23/2012 0949   CHOLHDL 5.5 11/23/2012 0949   VLDL 46*  11/23/2012 0949   LDLCALC 111* 01/28/2013 0939   LDLCALC 120* 11/23/2012 0949    Thyroid Function Tests:  Recent Labs Lab 02/08/13 0847  TSH 1.195   Coagulation:  Recent Labs Lab 02/08/13 0847 02/12/13 1245 02/12/13 1352  LABPROT 12.5 24.7* 14.1  INR 0.94 2.32* 1.11   Misc. Labs: Hemoglobin A1C  Trop  Micro Results: Recent Results (from the past 240 hour(s))  MRSA PCR SCREENING     Status: None   Collection Time    02/12/13  3:31 PM      Result Value Range Status   MRSA by PCR NEGATIVE  NEGATIVE Final   Comment:            The GeneXpert MRSA Assay (FDA     approved for NASAL specimens     only), is one component of a     comprehensive MRSA colonization     surveillance program. It is not     intended to diagnose MRSA     infection nor to guide or     monitor treatment for     MRSA infections.   Studies/Results: Dg Chest 2 View  02/12/2013   CLINICAL  DATA:  Substernal chest pain  EXAM: CHEST  2 VIEW  COMPARISON:  02/10/2013  FINDINGS: The heart size and mediastinal contours are within normal limits. Both lungs are clear. The visualized skeletal structures are unremarkable.  IMPRESSION: No active cardiopulmonary disease.   Electronically Signed   By: Inez Catalina M.D.   On: 02/12/2013 09:18   Medications:  Scheduled Meds: . [START ON 02/14/2013] aspirin  81 mg Oral Pre-Cath  . aspirin EC  81 mg Oral Daily  . atorvastatin  80 mg Oral q1800  . metoprolol tartrate  25 mg Oral Q8H  . pantoprazole  40 mg Oral Q0600  . Ticagrelor  90 mg Oral BID   Continuous Infusions: . sodium chloride 150 mL/hr at 02/12/13 1545  . sodium chloride 75 mL/hr (02/12/13 2334)  . heparin 1,200 Units/hr (02/13/13 0445)   PRN Meds:.acetaminophen, ALPRAZolam, nitroGLYCERIN, ondansetron (ZOFRAN) IV, sodium chloride, zolpidem Assessment/Plan: 53 y.o male PMH kidney stones, PAF, dyslipidemia, smoking, FH early CAD presented 2/7 around 7 am for substernal chest pain radiating to right jaw and  arm, sweating, sob found to be bradycardic (HR 30s-60s) with heart block, have T  Wave inversions in AVL, inferiorlateral ST segment changes, septal Qs, second troponin + 0.17.  He was found to have NSTEMI.    1. NSTEMI (non-ST elevated myocardial infarction) 2/2 total occlusion promixal to mid RCA -EF 48 % without definitive ischemia on nuclear stress test 1/28 -Trop 0.03>0.17>15.96>20 -cath results 2/7: LVEP: 20.  Left main: Moderate size vessel that trifurcated into the LAD, a ramus intermediate vessel, and a left circumflex coronary artery. There was smooth 30 - 40% distal taper of the left main. LAD: Moderate size vessel that had an 85% somewhat eccentric very proximal stenosis in the region of the first septal and diagonal vessel. The LAD extended to the left ventricular apex. Left circumflex: Angiographically normal vessel with mild luminal irregularity in the region of the trifurcation of a obtuse marginal branch, AV groove circumflex, and left atrial branch. Right coronary artery: Moderate size vessel that was totally occluded in the proximal to mid segment. There was evidence for left-to-right collaterals. He had PCI to the totally occluded right coronary artery with PTCA initially and then with demonstration of diffuse disease proximal to and extending significantly beyond the site of total occlusion, the region was stented with tandem 3.0x38 mm Xience Alpine DES stents postdilated with a taper of 3.26-3.21 mm and with restoration of TIMI 0 to TIMI 3 flow and with the entire region being reduced to 0%.  Left ventriculography revealed an ejection fraction of approximately 50%. There was evidence for mild mid and distal inferior hypocontractility.  -Heparin gtt, Asprin 81 mg, Lipitor 80, Loperssor 25 mg tid, prn NTG, Brilinta 90 mg bid  -prn Zofran  -pending EKG am, pending echo  -pending HA1C  -pt will ultimately need to be staged for PCI to LAD. This will be done 2/9 -echo   2. Bradycardia,  resolved  -noted transient heart block as well.  given Atropine x 1 in the ED.  He also had reperfusion induced marked brady and was tx'ed with Atropine.   -continue to monitor via tele   3. Dyslipidemia Lipid Panel     Component Value Date/Time   CHOL 203* 11/23/2012 0949   TRIG 206* 01/28/2013 0939   TRIG 229* 11/23/2012 0949   HDL 37* 11/23/2012 0949   CHOLHDL 5.5 11/23/2012 0949   VLDL 46* 11/23/2012 0949   LDLCALC 111* 01/28/2013 3419  LDLCALC 120* 11/23/2012 0949   On statin  4. PAF (paroxysmal atrial fibrillation) -currently in Afib and had RVR post procedure and was given Lopressor 2.5 mg x 4. INR 2.32 on admission pt was not on anticoagulation.  INR this am 1.11 -K at goal. Replaced with 2 g Mag. Mag was 1.9  -CHADSVASC 0-1.  Low mod risk stroke  -Cont Lopressor 25 tid  -pending EKG am   5. Tobacco abuse -smoking cessation   6. History of snoring- suspected sleep apnea -may need outpatient sleep study   7. F/E/N -dc IVF  -Mag 1.9 give Mag 2 gx1  -full liq diet changed to cardiac diet   8. DVT px  -Brilinta     LOS: 1 day   Cresenciano Genre, MD [phone] 02/13/2013, 7:25 AM  Patient seen with resident, agree with the above note.  1. CAD: Inferior MI yesterday, treated with tandem DES in RCA.  Has residual 85% pLAD, plan PCI tomorrow 2/9.   - Will arrange for PCI on Monday - Continue ticagrelor/ASA, statin.  - Echo today.  - Continue beta blocker.  Will hold ACEI for now with soft BP, hope to start prior to discharge.  2. Complete heart block: Transient in setting of inferior MI, no further bradycardia since PCI.   3. Atrial fibrillation: He went into atrial fibrillation with procedure yesterday and remains in atrial fibrillation with controlled rate today (on metoprolol). - Transition to Toprol XL for rate control.  - CHADSVASC = 1 (vascular disease).  He will remain on heparin gtt for now.  If atrial fibrillation converts, I do not think I would anticoagulate  him long-term.  If he does not come out of atrial fibrillation on his own, would consider cardioversion prior to discharge.  I would not do this prior to LAD PCI as procedure could potentially trigger recurrence.  4. Smoking cessation counseled.   Loralie Champagne 02/13/2013 8:27 AM

## 2013-02-13 NOTE — Progress Notes (Signed)
Chaplain consulted with patient's ED RN who recommended meeting patient's wife in Cath Lab waiting. Presented to medical team in Cath Lab, who said that pt's wife was in waiting area and patient was "doing fine so far." Chaplain was unable to locate patient's wife in waiting area. Will refer to unit chaplain for Monday; please page if pt or family needs emotional support.   Ethelene Browns [phone]

## 2013-02-13 NOTE — Progress Notes (Signed)
ANTICOAGULATION CONSULT NOTE - Follow Up Consult  Pharmacy Consult for heparin Indication: chest pain/ACS  No Known Allergies  Patient Measurements: Height: 5\' 7"  (170.2 cm) Weight: 227 lb 11.8 oz (103.3 kg) IBW/kg (Calculated) : 66.1 Heparin Dosing Weight: 80 kg  Vital Signs: Temp: 97.1 F (36.2 C) (02/08 1133) Temp src: Oral (02/08 1133) BP: 94/59 mmHg (02/08 1032) Pulse Rate: 87 (02/08 1233)  Labs:  Recent Labs  02/12/13 0757 02/12/13 1245 02/12/13 1352 02/12/13 1825 02/13/13 0026 02/13/13 0120 02/13/13 1000  HGB 16.9  --   --   --   --  14.2  --   HCT 48.6  --   --   --   --  41.3  --   PLT 176  --   --   --   --  164  --   APTT  --   --  68*  --   --   --   --   LABPROT  --  24.7* 14.1  --   --   --   --   INR  --  2.32* 1.11  --   --   --   --   HEPARINUNFRC  --   --   --   --   --   --  0.21*  CREATININE 0.93  --   --   --   --  0.71  --   TROPONINI  --   --   --  15.96* >20.00*  --   --     Estimated Creatinine Clearance: 122.3 ml/min (by C-G formula based on Cr of 0.71).   Assessment: Patient is a 53 y.o. M s/p cath  And PCI to RCA with plan for staged PCI to LAD on 2/9.  Angiomax was continued 4 hours post cath and was d/ced at 7 PM on 02/07. Heparin resumed post cath at 1200 units/hr, HL returned at 0.21CBC, plts wnl. No bleeding reported.   Goal of Therapy:  Heparin level 0.3-0.7 units/ml Monitor platelets by anticoagulation protocol: Yes   Plan:  Increase heparin drip to 1400 units/hr Heparin level at 1900 Monitor signs of bleeding  F/u post-cath plans  [place] B. Leitha Schuller, PharmD Clinical Pharmacist - Resident Phone: [phone] Pager: [phone] 02/13/2013 1:05 PM

## 2013-02-13 NOTE — Progress Notes (Signed)
Echo Lab  2D Echocardiogram completed.  [place], RDCS 02/13/2013 2:51 PM

## 2013-02-13 NOTE — Progress Notes (Signed)
Smoking cessation material/handout given and discussed with pt and wife. Smoking cessation video viewed per pt and wife.

## 2013-02-13 NOTE — Progress Notes (Signed)
Utilization Review Completed.Donne Anon T2/08/2013

## 2013-02-13 NOTE — Progress Notes (Signed)
ANTICOAGULATION CONSULT NOTE - Follow Up Consult  Pharmacy Consult for Heparin Indication: CAD  No Known Allergies  Patient Measurements: Height: 5\' 7"  (170.2 cm) Weight: 227 lb 11.8 oz (103.3 kg) IBW/kg (Calculated) : 66.1 Heparin Dosing Weight: 80 kg  Vital Signs: Temp: 97.1 F (36.2 C) (02/08 1133) Temp src: Oral (02/08 1133) BP: 106/62 mmHg (02/08 1800) Pulse Rate: 93 (02/08 1800)  Labs:  Recent Labs  02/12/13 0757 02/12/13 1245 02/12/13 1352 02/12/13 1825 02/13/13 0026 02/13/13 0120 02/13/13 1000 02/13/13 1835  HGB 16.9  --   --   --   --  14.2  --   --   HCT 48.6  --   --   --   --  41.3  --   --   PLT 176  --   --   --   --  164  --   --   APTT  --   --  68*  --   --   --   --   --   LABPROT  --  24.7* 14.1  --   --   --   --   --   INR  --  2.32* 1.11  --   --   --   --   --   HEPARINUNFRC  --   --   --   --   --   --  0.21* 0.53  CREATININE 0.93  --   --   --   --  0.71  --   --   TROPONINI  --   --   --  15.96* >20.00*  --   --   --     Estimated Creatinine Clearance: 122.3 ml/min (by C-G formula based on Cr of 0.71).  Assessment: Anticoagulation: S/p cath and PCI to RCA 2/7 with plan for staged PCI to LAD 2/9. Heparin level 0.53 now in goal range.  Goal of Therapy:  Heparin level 0.3-0.7 units/ml Monitor platelets by anticoagulation protocol: Yes   Plan:  Continue heparin 1400 units/hr. Daily heparin level and CBC  Wayland Salinas 02/13/2013,7:23 PM

## 2013-02-14 ENCOUNTER — Encounter (HOSPITAL_COMMUNITY)
Admission: EM | Disposition: A | Discharge status: Home / Self Care | Payer: BC Managed Care – PPO | Source: Home / Self Care | Attending: Cardiology

## 2013-02-14 DIAGNOSIS — I200 Unstable angina: Secondary | ICD-10-CM | POA: Diagnosis present

## 2013-02-14 DIAGNOSIS — I2510 Atherosclerotic heart disease of native coronary artery without angina pectoris: Secondary | ICD-10-CM

## 2013-02-14 DIAGNOSIS — Z9861 Coronary angioplasty status: Secondary | ICD-10-CM

## 2013-02-14 HISTORY — PX: PERCUTANEOUS CORONARY STENT INTERVENTION (PCI-S): SHX5485

## 2013-02-14 LAB — URINE MICROSCOPIC-ADD ON

## 2013-02-14 LAB — CBC
HCT: 45 % (ref 39.0–52.0)
Hemoglobin: 15.3 g/dL (ref 13.0–17.0)
MCH: 32.3 pg (ref 26.0–34.0)
MCHC: 34 g/dL (ref 30.0–36.0)
MCV: 95.1 fL (ref 78.0–100.0)
Platelets: 163 10*3/uL (ref 150–400)
RBC: 4.73 MIL/uL (ref 4.22–5.81)
RDW: 13.9 % (ref 11.5–15.5)
WBC: 13.7 10*3/uL — ABNORMAL HIGH (ref 4.0–10.5)

## 2013-02-14 LAB — BASIC METABOLIC PANEL
BUN: 12 mg/dL (ref 6–23)
CHLORIDE: 104 meq/L (ref 96–112)
CO2: 23 mEq/L (ref 19–32)
Calcium: 8.6 mg/dL (ref 8.4–10.5)
Creatinine, Ser: 0.85 mg/dL (ref 0.50–1.35)
GFR calc Af Amer: >90 mL/min (ref >90)
GFR calc non Af Amer: >90 mL/min (ref >90)
Glucose, Bld: 104 mg/dL — ABNORMAL HIGH (ref 70–99)
POTASSIUM: 4.1 meq/L (ref 3.7–5.3)
Sodium: 142 mEq/L (ref 137–147)

## 2013-02-14 LAB — URINALYSIS, ROUTINE W REFLEX MICROSCOPIC
Bilirubin Urine: NEGATIVE
Glucose, UA: NEGATIVE mg/dL
Ketones, ur: NEGATIVE mg/dL
LEUKOCYTES UA: NEGATIVE
Nitrite: NEGATIVE
PH: 7.5 (ref 5.0–8.0)
Protein, ur: NEGATIVE mg/dL
Specific Gravity, Urine: 1.013 (ref 1.005–1.030)
Urobilinogen, UA: 0.2 mg/dL (ref 0.0–1.0)

## 2013-02-14 LAB — POCT I-STAT, CHEM 8
BUN: 19 mg/dL (ref 6–23)
CALCIUM ION: 1.22 mmol/L (ref 1.12–1.23)
Chloride: 107 mEq/L (ref 96–112)
Creatinine, Ser: 0.8 mg/dL (ref 0.50–1.35)
Glucose, Bld: 101 mg/dL — ABNORMAL HIGH (ref 70–99)
HEMATOCRIT: 45 % (ref 39.0–52.0)
HEMOGLOBIN: 15.3 g/dL (ref 13.0–17.0)
Potassium: 4.1 mEq/L (ref 3.7–5.3)
Sodium: 143 mEq/L (ref 137–147)
TCO2: 21 mmol/L (ref 0–100)

## 2013-02-14 LAB — HEPARIN LEVEL (UNFRACTIONATED): HEPARIN UNFRACTIONATED: 1.16 [IU]/mL — AB (ref 0.30–0.70)

## 2013-02-14 LAB — POCT ACTIVATED CLOTTING TIME
Activated Clotting Time: 653 seconds
Activated Clotting Time: 464 seconds

## 2013-02-14 SURGERY — PERCUTANEOUS CORONARY STENT INTERVENTION (PCI-S)
Anesthesia: LOCAL

## 2013-02-14 MED ORDER — LIDOCAINE HCL (PF) 1 % IJ SOLN
INTRAMUSCULAR | Status: AC
  Filled 2013-02-14: qty 30

## 2013-02-14 MED ORDER — VERAPAMIL HCL 2.5 MG/ML IV SOLN
INTRAVENOUS | Status: AC
  Filled 2013-02-14: qty 2

## 2013-02-14 MED ORDER — HYDROCODONE-ACETAMINOPHEN 5-325 MG PO TABS
1.0000 | ORAL_TABLET | Freq: Four times a day (QID) | ORAL | Status: DC | PRN
  Administered 2013-02-14: 1 via ORAL
  Filled 2013-02-14: qty 1

## 2013-02-14 MED ORDER — POLYETHYLENE GLYCOL 3350 17 G PO PACK
17.0000 g | PACK | Freq: Every day | ORAL | Status: DC
  Administered 2013-02-15: 10:00:00 17 g via ORAL
  Filled 2013-02-14 (×3): qty 1

## 2013-02-14 MED ORDER — METOPROLOL SUCCINATE ER 25 MG PO TB24
25.0000 mg | ORAL_TABLET | Freq: Once | ORAL | Status: AC
  Administered 2013-02-14: 25 mg via ORAL
  Filled 2013-02-14: qty 1

## 2013-02-14 MED ORDER — HEPARIN (PORCINE) IN NACL 2-0.9 UNIT/ML-% IJ SOLN
INTRAMUSCULAR | Status: AC
  Filled 2013-02-14: qty 500

## 2013-02-14 MED ORDER — SODIUM CHLORIDE 0.9 % IJ SOLN: 3.0000 mL | INTRAMUSCULAR | Status: DC | PRN

## 2013-02-14 MED ORDER — SODIUM CHLORIDE 0.9 % IV SOLN: INTRAVENOUS | Status: DC

## 2013-02-14 MED ORDER — SODIUM CHLORIDE 0.9 % IV SOLN: 250.0000 mL | INTRAVENOUS | Status: DC | PRN

## 2013-02-14 MED ORDER — HYDROCODONE-ACETAMINOPHEN 5-325 MG PO TABS
1.0000 | ORAL_TABLET | ORAL | Status: DC | PRN
  Administered 2013-02-14 – 2013-02-15 (×2): 2 via ORAL
  Filled 2013-02-14 (×2): qty 2

## 2013-02-14 MED ORDER — MIDAZOLAM HCL 2 MG/2ML IJ SOLN
INTRAMUSCULAR | Status: AC
  Filled 2013-02-14: qty 2

## 2013-02-14 MED ORDER — WARFARIN SODIUM 10 MG PO TABS
10.0000 mg | ORAL_TABLET | Freq: Once | ORAL | Status: DC
  Filled 2013-02-14: qty 1

## 2013-02-14 MED ORDER — SODIUM CHLORIDE 0.9 % IJ SOLN: 3.0000 mL | Freq: Two times a day (BID) | INTRAMUSCULAR | Status: DC

## 2013-02-14 MED ORDER — HEPARIN (PORCINE) IN NACL 2-0.9 UNIT/ML-% IJ SOLN
INTRAMUSCULAR | Status: AC
  Filled 2013-02-14: qty 1000

## 2013-02-14 MED ORDER — SODIUM CHLORIDE 0.9 % IV SOLN: 250.0000 mL | INTRAVENOUS | Status: DC

## 2013-02-14 MED ORDER — FENTANYL CITRATE 0.05 MG/ML IJ SOLN
INTRAMUSCULAR | Status: AC
  Filled 2013-02-14: qty 2

## 2013-02-14 MED ORDER — BIVALIRUDIN 250 MG IV SOLR
INTRAVENOUS | Status: AC
  Filled 2013-02-14: qty 250

## 2013-02-14 MED ORDER — WARFARIN - PHARMACIST DOSING INPATIENT: Freq: Every day | Status: DC

## 2013-02-14 MED ORDER — HEPARIN (PORCINE) IN NACL 100-0.45 UNIT/ML-% IJ SOLN
1200.0000 [IU]/h | INTRAMUSCULAR | Status: DC
  Administered 2013-02-14: 1200 [IU]/h via INTRAVENOUS
  Filled 2013-02-14 (×2): qty 250

## 2013-02-14 MED ORDER — CLOPIDOGREL BISULFATE 75 MG PO TABS
75.0000 mg | ORAL_TABLET | Freq: Every day | ORAL | Status: DC
  Administered 2013-02-15 – 2013-02-16 (×2): 75 mg via ORAL
  Filled 2013-02-14 (×2): qty 1

## 2013-02-14 MED ORDER — WARFARIN SODIUM 7.5 MG PO TABS
7.5000 mg | ORAL_TABLET | Freq: Once | ORAL | Status: AC
  Administered 2013-02-14: 20:00:00 7.5 mg via ORAL
  Filled 2013-02-14: qty 1

## 2013-02-14 MED ORDER — ASPIRIN 81 MG PO CHEW
81.0000 mg | CHEWABLE_TABLET | Freq: Every day | ORAL | Status: DC
  Administered 2013-02-15 – 2013-02-16 (×2): 81 mg via ORAL
  Filled 2013-02-14 (×2): qty 1

## 2013-02-14 MED ORDER — DIAZEPAM 5 MG PO TABS: 5.0000 mg | ORAL_TABLET | ORAL | Status: DC

## 2013-02-14 MED ORDER — HEPARIN SODIUM (PORCINE) 1000 UNIT/ML IJ SOLN
INTRAMUSCULAR | Status: AC
  Filled 2013-02-14: qty 1

## 2013-02-14 MED ORDER — CLOPIDOGREL BISULFATE 75 MG PO TABS
300.0000 mg | ORAL_TABLET | Freq: Once | ORAL | Status: AC
  Administered 2013-02-14: 20:00:00 300 mg via ORAL
  Filled 2013-02-14: qty 4

## 2013-02-14 MED ORDER — SODIUM CHLORIDE 0.9 % IV SOLN: INTRAVENOUS | Status: AC

## 2013-02-14 MED ORDER — NITROGLYCERIN 0.2 MG/ML ON CALL CATH LAB
INTRAVENOUS | Status: AC
  Filled 2013-02-14: qty 1

## 2013-02-14 MED ORDER — SODIUM CHLORIDE 0.9 % IJ SOLN
3.0000 mL | Freq: Two times a day (BID) | INTRAMUSCULAR | Status: DC
  Administered 2013-02-15: 22:00:00 3 mL via INTRAVENOUS

## 2013-02-14 MED FILL — Sodium Chloride IV Soln 0.9%: INTRAVENOUS | Qty: 50 | Status: AC

## 2013-02-14 NOTE — Progress Notes (Addendum)
[facility] Daily Progress Note  Subjective: Pt denies chest pain.  SOB at rest and with exertion.  7/10 back pain. Constipated given Milk of Mg last night no BM.    RN: HR to the 1 teens. sbp dropped to 80s asx'matic given 500 cc bolus NS  Objective: Vital signs in last 24 hours: Filed Vitals:   02/14/13 0300 02/14/13 0400 02/14/13 0500 02/14/13 0600  BP: 89/44 107/55 110/85 96/60  Pulse: 58 109 92 67  Temp:  97.6 F (36.4 C)    TempSrc:  Oral    Resp:      Height:      Weight:   228 lb 9.9 oz (103.7 kg)   SpO2: 97% 98% 97% 99%   Weight change: 3 lb 9.9 oz (1.641 kg)  Intake/Output Summary (Last 24 hours) at 02/14/13 0727 Last data filed at 02/14/13 0600  Gross per 24 hour  Intake 1606.6 ml  Output   1900 ml  Net -293.4 ml   Vitals reviewed. BP 124/77 (92), 110-120s, 100% RA General: resting in recliner, NAD HEENT: [place]/at,  no scleral icterus Cardiac: AF, no rubs, murmurs or gallops Pulm: clear to auscultation bilaterally, no wheezes, rales, or rhonchi Abd: soft, nontender, nondistended, BS present, obese  Ext: warm and well perfused, no pedal edema Neuro: alert and oriented X3, cranial nerves II-XII grossly intact, moving all 4 extremities   Lab Results: Basic Metabolic Panel:  Recent Labs Lab 02/12/13 0757 02/13/13 0120 02/14/13 0409  NA 143 141 142  K 4.5 4.2 4.1  CL 106 106 104  CO2 21 22 23   GLUCOSE 125* 137* 104*  BUN 20 16 12   CREATININE 0.93 0.71 0.85  CALCIUM 9.4 8.6 8.6  MG  --  1.9  --    CBC:  Recent Labs Lab 02/13/13 0120 02/14/13 0409  WBC 13.6* 13.7*  HGB 14.2 15.3  HCT 41.3 45.0  MCV 94.9 95.1  PLT 164 163   Cardiac Enzymes:  Recent Labs Lab 02/12/13 1825 02/13/13 0026  TROPONINI 15.96* >20.00*   CBG:  Recent Labs Lab 02/12/13 1537  GLUCAP 101*     Recent Labs Lab 02/13/13 1000  HGBA1C 5.8*    Fasting Lipid Panel: Lipid Panel     Component Value Date/Time   CHOL 203* 11/23/2012 0949   TRIG 206*  01/28/2013 0939   TRIG 229* 11/23/2012 0949   HDL 37* 11/23/2012 0949   CHOLHDL 5.5 11/23/2012 0949   VLDL 46* 11/23/2012 0949   LDLCALC 111* 01/28/2013 0939   LDLCALC 120* 11/23/2012 0949    Thyroid Function Tests:  Recent Labs Lab 02/08/13 0847  TSH 1.195   Coagulation:  Recent Labs Lab 02/08/13 0847 02/12/13 1245 02/12/13 1352  LABPROT 12.5 24.7* 14.1  INR 0.94 2.32* 1.11   Misc. Labs: none  Micro Results: Recent Results (from the past 240 hour(s))  MRSA PCR SCREENING     Status: None   Collection Time    02/12/13  3:31 PM      Result Value Range Status   MRSA by PCR NEGATIVE  NEGATIVE Final   Comment:            The GeneXpert MRSA Assay (FDA     approved for NASAL specimens     only), is one component of a     comprehensive MRSA colonization     surveillance program. It is not     intended to diagnose MRSA     infection nor to  guide or     monitor treatment for     MRSA infections.   Studies/Results: Dg Chest 2 View  02/12/2013   CLINICAL DATA:  Substernal chest pain  EXAM: CHEST  2 VIEW  COMPARISON:  02/10/2013  FINDINGS: The heart size and mediastinal contours are within normal limits. Both lungs are clear. The visualized skeletal structures are unremarkable.  IMPRESSION: No active cardiopulmonary disease.   Electronically Signed   By: Inez Catalina M.D.   On: 02/12/2013 09:18   Medications:  Scheduled Meds: . aspirin  81 mg Oral Pre-Cath  . aspirin EC  81 mg Oral Daily  . atorvastatin  80 mg Oral q1800  . metoprolol succinate  25 mg Oral BID  . pantoprazole  40 mg Oral Q0600  . sodium chloride  3 mL Intravenous Q12H  . Ticagrelor  90 mg Oral BID   Continuous Infusions: . sodium chloride 1 mL/kg/hr (02/14/13 0600)  . sodium chloride    . heparin 1,200 Units/hr (02/14/13 0600)   PRN Meds:.sodium chloride, acetaminophen, ALPRAZolam, magnesium hydroxide, nitroGLYCERIN, ondansetron (ZOFRAN) IV, sodium chloride, sodium chloride,  zolpidem Assessment/Plan: 53 y.o male PMH kidney stones, PAF, dyslipidemia, smoking, FH early CAD presented 2/7 around 7 am for substernal chest pain radiating to right jaw and arm, sweating, sob found to be bradycardic (HR 30s-60s) with heart block, have T  Wave inversions in AVL, inferiorlateral ST segment changes, septal Qs, second troponin + 0.17.  He was found to have NSTEMI.    1. NSTEMI (non-ST elevated myocardial infarction) 2/2 total occlusion promixal to mid RCA -EF 48 % without definitive ischemia on nuclear stress test 1/28 -cath results 2/7: LVEP: 20.  Left main: Moderate size vessel that trifurcated into the LAD, a ramus intermediate vessel, and a left circumflex coronary artery. There was smooth 30 - 40% distal taper of the left main. LAD: Moderate size vessel that had an 85% somewhat eccentric very proximal stenosis in the region of the first septal and diagonal vessel. The LAD extended to the left ventricular apex. Left circumflex: Angiographically normal vessel with mild luminal irregularity in the region of the trifurcation of a obtuse marginal branch, AV groove circumflex, and left atrial branch. Right coronary artery: Moderate size vessel that was totally occluded in the proximal to mid segment. There was evidence for left-to-right collaterals. He had PCI to the totally occluded right coronary artery with PTCA initially and then with demonstration of diffuse disease proximal to and extending significantly beyond the site of total occlusion, the region was stented with tandem 3.0x38 mm Xience Alpine DES stents postdilated with a taper of 3.26-3.21 mm and with restoration of TIMI 0 to TIMI 3 flow and with the entire region being reduced to 0%.  Left ventriculography revealed an ejection fraction of approximately 50%. There was evidence for mild mid and distal inferior hypocontractility.  -Heparin gtt, Asprin 81 mg, Lipitor 80, Toprol 25 XL, prn NTG, Brilinta 90 mg bid, holding ACEI d/t  soft BPS -prn Zofran  - pending echo 2/10 -Cath for PCI to LAD 2/9  2. Bradycardia, resolved  -monitor via tele   3. Dyslipidemia Lipid Panel     Component Value Date/Time   CHOL 203* 11/23/2012 0949   TRIG 206* 01/28/2013 0939   TRIG 229* 11/23/2012 0949   HDL 37* 11/23/2012 0949   CHOLHDL 5.5 11/23/2012 0949   VLDL 46* 11/23/2012 0949   LDLCALC 111* 01/28/2013 0939   LDLCALC 120* 11/23/2012 0949   On statin  4. PAF (paroxysmal atrial fibrillation) -currently in Afib  -CHADSVASC 1-2 (?HTN history (elevated BP on admission 148/86), h/o CAD).  Mod risk stroke with score of 2 -Cont Toprol XL 25 mg bid.  Given extra 25 mg x 1 dose today -consider cardioversion prior to d/c. Consider anticoagulation long term   5. Tobacco abuse -smoking cessation   6. History of snoring- suspected sleep apnea -may need outpatient sleep study   7. Constipation -added Miralax   8. Chronic back pain -will try Norco 5-325 mg q6 hours prn   9. F/E/N -NS 100 cc/hr pre cath -Monitor electrolytes  -NPO for cath then cardiac diet   10. DVT px  -Brilinta     LOS: 2 days   Cresenciano Genre, MD [phone] 02/14/2013, 7:27 AM  Patient seen, examined. Available data reviewed. Agree with findings, assessment, and plan as outlined by Dr Aundra Dubin. Exam reveals an alert, oriented gentleman in no distress. Heart is tachycardic and irregular without murmurs or gallops. Lungs are clear bilaterally. There is no peripheral edema. Plans for staged PCI of the proximal LAD. I have reviewed the patient's cardiac catheterization films and agree with the plan as outlined. Main issue at this point relates to management of his atrial fibrillation. When he saw Dr. Duwayne Heck recently, he was noted to be in sinus rhythm. With borderline blood pressure and elevated heart rate, I do not think he is tolerate atrial fibrillation well. I would be inclined to proceed with cardioversion. With paroxysmal atrial fibrillation and  interruption of his anticoagulation, I think this will need to be directed by TEE. Will try to arrange for tomorrow. Plan on starting warfarin today. Heparin will be resumed after his cardiac catheterization. Plan discussed with the patient and his wife.  Sherren Mocha, M.D. 02/14/2013 11:38 AM

## 2013-02-14 NOTE — Interval H&P Note (Signed)
History and Physical Interval Note:  02/14/2013 10:14 AM  [facility]  has presented today for cardiac cath with the diagnosis of CAD, recent STEMI inferior with occluded RCA, also with severe LAD stenosis and continued dyspnea over weekend felt to be angina related to his LAD lesion. Planned PCI of LAD today.  The various methods of treatment have been discussed with the patient and family. After consideration of risks, benefits and other options for treatment, the patient has consented to  Procedure(s): PERCUTANEOUS CORONARY STENT INTERVENTION (PCI-S) (N/A) as a surgical intervention .  The patient's history has been reviewed, patient examined, no change in status, stable for surgery.  I have reviewed the patient's chart and labs.  Questions were answered to the patient's satisfaction.     MCALHANY,CHRISTOPHER

## 2013-02-14 NOTE — Interval H&P Note (Signed)
History and Physical Interval Note:  02/14/2013 10:15 AM  [facility]  has presented today for surgery, with the diagnosis of cp  The various methods of treatment have been discussed with the patient and family. After consideration of risks, benefits and other options for treatment, the patient has consented to  Procedure(s): PERCUTANEOUS CORONARY STENT INTERVENTION (PCI-S) (N/A) as a surgical intervention .  The patient's history has been reviewed, patient examined, no change in status, stable for surgery.  I have reviewed the patient's chart and labs.  Questions were answered to the patient's satisfaction.    Cath Lab Visit (complete for each Cath Lab visit)  Clinical Evaluation Leading to the Procedure:   ACS: no  Non-ACS:    Anginal Classification: CCS III  Anti-ischemic medical therapy: Maximal Therapy (2 or more classes of medications)  Non-Invasive Test Results: No non-invasive testing performed  Prior CABG: No previous CABG       [facility]

## 2013-02-14 NOTE — Progress Notes (Signed)
ANTICOAGULATION CONSULT NOTE - Follow Up Consult  Pharmacy Consult for Heparin Indication: CAD  No Known Allergies  Patient Measurements: Height: 5\' 7"  (170.2 cm) Weight: 228 lb 9.9 oz (103.7 kg) IBW/kg (Calculated) : 66.1 Heparin Dosing Weight: 80 kg  Vital Signs: Temp: 97.6 F (36.4 C) (02/09 0000) Temp src: Oral (02/09 0000) BP: 89/44 mmHg (02/09 0300) Pulse Rate: 58 (02/09 0300)  Labs:  Recent Labs  02/12/13 0757 02/12/13 1245 02/12/13 1352 02/12/13 1825 02/13/13 0026 02/13/13 0120 02/13/13 1000 02/13/13 1835 02/14/13 0409  HGB 16.9  --   --   --   --  14.2  --   --  15.3  HCT 48.6  --   --   --   --  41.3  --   --  45.0  PLT 176  --   --   --   --  164  --   --  163  APTT  --   --  68*  --   --   --   --   --   --   LABPROT  --  24.7* 14.1  --   --   --   --   --   --   INR  --  2.32* 1.11  --   --   --   --   --   --   HEPARINUNFRC  --   --   --   --   --   --  0.21* 0.53 1.16*  CREATININE 0.93  --   --   --   --  0.71  --   --  0.85  TROPONINI  --   --   --  15.96* >20.00*  --   --   --   --     Estimated Creatinine Clearance: 115.3 ml/min (by C-G formula based on Cr of 0.85).  Assessment: 53 yo male with CAD awaiting PCI today, for heparin.  Goal of Therapy:  Heparin level 0.3-0.7 units/ml Monitor platelets by anticoagulation protocol: Yes   Plan:  Decrease heparin 1200 units/hr F/U after cath   Arthi Mcdonald, [name] 02/14/2013,5:58 AM

## 2013-02-14 NOTE — Progress Notes (Signed)
Reviewed this gentleman's anticoagulant options considering recent MI and multivessel stenting as well as indication for anticoagulation with atrial fib. He was recently in sinus rhythm when he saw Dr Debara Pickett and even earlier this hospitalization but has had PAF by report. He's having dyspnea likely related to brilinta.   I think his best option is ASA 81 mg, switch brilinta to plavix 300 mg tonight then 75 mg daily tomorrow, and heparin/warfarin overlap. He is scheduled for TEE/cardioversion tomorrow. Triple therapy can be limited to 3-6 months depending on his clinical course.   Sherren Mocha 02/14/2013 6:23 PM

## 2013-02-14 NOTE — CV Procedure (Signed)
      Cardiac Catheterization Operative Report  [facility] [id number] 2/9/201511:03 AM [name], MD  Procedure Performed:  1. PTCA/DES x 1 proximal LAD  Operator: Lauree Chandler, MD  Arterial access site:  Right radial artery.   Indication: 53 yo male with history of  Tobacco abuse, HLD, atrial fibrillation and CAD who is here today for staged PCI of LAD. Admitted 02/12/13 with chest pain/NSTEMI. Cardiac cath per Dr. Claiborne Billings with occluded RCA, treated with overlapping DES in the proximal and mid RCA. Pt also found to have severe proximal LAD stenosis. He has had continued chest pain and dyspnea over the last 36 hours. PCI of LAD is indicated for unstable angina.                                      Procedure Details: The risks, benefits, complications, treatment options, and expected outcomes were discussed with the patient. The patient and/or family concurred with the proposed plan, giving informed consent. The patient was brought to the cath lab after IV hydration was begun and oral premedication was given. The patient was further sedated with Versed and Fentanyl. The right wrist was assessed with an Allens test which was positive. The right wrist was prepped and draped in a sterile fashion. 1% lidocaine was used for local anesthesia. Using the modified Seldinger access technique, a 5/6 French sheath was placed in the right radial artery. 3 mg Verapamil was given through the sheath. A weight based bolus of Angiomax was given and a drip was started. When the ACT was over 200, I engaged the left main with a XB LAD 3.5 guiding catheter. I then passed a Cougar IC wire down the LAD. A 2.5 x 12 mm balloon was used to pre-dilate the calcified proximal stenosis. A 2.75 x 20 mm Promus Premier DES was then carefully positioned and deployed in the proximal LAD. The stent was post-dilated with a 3.0 x 15 mm Asbury balloon x 1. There was an excellent angiographic result. The stenosis was taken from  90% down to 0%. The sheath was removed from the right radial artery and a Terumo hemostasis band was applied at the arteriotomy site on the right wrist.   There were no immediate complications. The patient was taken to the recovery area in stable condition.   Hemodynamic Findings: Central aortic pressure: 115/78  Impression: 1. Severe stenosis proximal LAD with ongoing chest pain and dyspnea c/w unstable angina. 2. Successful PTCA/DES x 1 proximal LAD  Recommendations: He will be continued on ASA and Brilinta, beta blocker and statin. Will resume IV heparin 8 hours post sheath pull. If he is started on coumadin for his atrial fibrillation, will need to consider stopping ASA and continuing Brilinta and coumadin.        Complications:  None. The patient tolerated the procedure well.

## 2013-02-14 NOTE — Progress Notes (Addendum)
        [facility] has been requested to perform a transesophageal echocardiogram with DCCV on 02/15/13 for atrial fibrillation.  After careful review of history and examination, the risks and benefits of transesophageal echocardiogram have been explained including risks of esophageal damage, perforation (1:10,000 risk), bleeding, pharyngeal hematoma as well as other potential complications associated with conscious sedation including aspiration, arrhythmia, respiratory failure and death. Alternatives to treatment were discussed, questions were answered. Patient is willing to proceed.   Perry Mount, PA-C 02/14/2013 2:02 PM

## 2013-02-14 NOTE — Progress Notes (Signed)
ANTICOAGULATION CONSULT NOTE - Follow Up Consult  Pharmacy Consult for heparin and coumadin Indication: s/p staged PCI to LAD and afib  No Known Allergies  Patient Measurements: Height: 5\' 7"  (170.2 cm) Weight: 228 lb 9.9 oz (103.7 kg) IBW/kg (Calculated) : 66.1 Heparin Dosing Weight: 80 kg  Vital Signs: Temp: 98 F (36.7 C) (02/09 1133) Temp src: Oral (02/09 1133) BP: 123/71 mmHg (02/09 1133) Pulse Rate: 96 (02/09 1133)  Labs:  Recent Labs  02/12/13 0757 02/12/13 1245 02/12/13 1352 02/12/13 1825 02/13/13 0026 02/13/13 0120 02/13/13 1000 02/13/13 1835 02/14/13 0409  HGB 16.9  --   --   --   --  14.2  --   --  15.3  HCT 48.6  --   --   --   --  41.3  --   --  45.0  PLT 176  --   --   --   --  164  --   --  163  APTT  --   --  68*  --   --   --   --   --   --   LABPROT  --  24.7* 14.1  --   --   --   --   --   --   INR  --  2.32* 1.11  --   --   --   --   --   --   HEPARINUNFRC  --   --   --   --   --   --  0.21* 0.53 1.16*  CREATININE 0.93  --   --   --   --  0.71  --   --  0.85  TROPONINI  --   --   --  15.96* >20.00*  --   --   --   --     Estimated Creatinine Clearance: 115.3 ml/min (by C-G formula based on Cr of 0.85).   Medications:  Scheduled:  . aspirin EC  81 mg Oral Daily  . atorvastatin  80 mg Oral q1800  . metoprolol succinate  25 mg Oral BID  . pantoprazole  40 mg Oral Q0600  . polyethylene glycol  17 g Oral Daily  . Ticagrelor  90 mg Oral BID   Infusions:  . sodium chloride      Assessment: 53 yo male with afib and s/p staged PCI to LAD today will be put back on heparin therapy 8hrs post sheath removal.  TR band was removed at 1500 today.  Will also start on coumadin.  Coumadin score is ~9.  INR was 1.11 on 02/12/13.  Per RN, patient has some hematuria.  Talked to MD and wants to keep patient on coumadin and heparin for now.  Goal of Therapy:  Heparin level 0.3-0.7 units/ml; INR 2-3 Monitor platelets by anticoagulation protocol: Yes    Plan:  1) Restart heparin drip at 1200 units/hr at 2300. No bolus 2) 6hrs heparin level 3) Coumadin 7.5 mg po x1  4) Daily PT/INR 5) Watch for signs of bleeding  Snigdha Howser, Tsz-Yin 02/14/2013,11:36 AM

## 2013-02-14 NOTE — H&P (View-Only) (Signed)
[facility] Daily Progress Note  Subjective: Denies chest pain or pain at cath site.  He had sob this am with exertion.  He does not have a PCP may establish at [facility]   Objective: Vital signs in last 24 hours: Filed Vitals:   02/13/13 0439 02/13/13 0500 02/13/13 0600 02/13/13 0700  BP:  114/66 123/66 108/73  Pulse:  74 87 87  Temp:      TempSrc:      Resp:  22 16 21   Height:      Weight: 227 lb 11.8 oz (103.3 kg)     SpO2:  97% 100% 99%   Weight change:   Intake/Output Summary (Last 24 hours) at 02/13/13 0725 Last data filed at 02/13/13 0600  Gross per 24 hour  Intake 1795.91 ml  Output   1475 ml  Net 320.91 ml   Vitals reviewed. General: resting in bed, NAD HEENT: [place]/at,  no scleral icterus Cardiac: AF, no rubs, murmurs or gallops Pulm: clear to auscultation bilaterally, no wheezes, rales, or rhonchi Abd: soft, nontender, nondistended, BS present, obese  Ext: warm and well perfused, no pedal edema Neuro: alert and oriented X3, cranial nerves II-XII grossly intact, moving all 4 extremities  Cath site: with bandage. No bleeding/bruit   Lab Results: Basic Metabolic Panel:  Recent Labs Lab 02/12/13 0757 02/13/13 0120  NA 143 141  K 4.5 4.2  CL 106 106  CO2 21 22  GLUCOSE 125* 137*  BUN 20 16  CREATININE 0.93 0.71  CALCIUM 9.4 8.6  MG  --  1.9   CBC:  Recent Labs Lab 02/12/13 0757 02/13/13 0120  WBC 11.8* 13.6*  HGB 16.9 14.2  HCT 48.6 41.3  MCV 94.2 94.9  PLT 176 164   Cardiac Enzymes:  Recent Labs Lab 02/12/13 1825 02/13/13 0026  TROPONINI 15.96* >20.00*   CBG:  Recent Labs Lab 02/12/13 1537  GLUCAP 101*   Hemoglobin A1C:-pending  No results found for this basename: HGBA1C,  in the last 168 hours  Fasting Lipid Panel: Lipid Panel     Component Value Date/Time   CHOL 203* 11/23/2012 0949   TRIG 206* 01/28/2013 0939   TRIG 229* 11/23/2012 0949   HDL 37* 11/23/2012 0949   CHOLHDL 5.5 11/23/2012 0949   VLDL 46*  11/23/2012 0949   LDLCALC 111* 01/28/2013 0939   LDLCALC 120* 11/23/2012 0949    Thyroid Function Tests:  Recent Labs Lab 02/08/13 0847  TSH 1.195   Coagulation:  Recent Labs Lab 02/08/13 0847 02/12/13 1245 02/12/13 1352  LABPROT 12.5 24.7* 14.1  INR 0.94 2.32* 1.11   Misc. Labs: Hemoglobin A1C  Trop  Micro Results: Recent Results (from the past 240 hour(s))  MRSA PCR SCREENING     Status: None   Collection Time    02/12/13  3:31 PM      Result Value Range Status   MRSA by PCR NEGATIVE  NEGATIVE Final   Comment:            The GeneXpert MRSA Assay (FDA     approved for NASAL specimens     only), is one component of a     comprehensive MRSA colonization     surveillance program. It is not     intended to diagnose MRSA     infection nor to guide or     monitor treatment for     MRSA infections.   Studies/Results: Dg Chest 2 View  02/12/2013   CLINICAL  DATA:  Substernal chest pain  EXAM: CHEST  2 VIEW  COMPARISON:  02/10/2013  FINDINGS: The heart size and mediastinal contours are within normal limits. Both lungs are clear. The visualized skeletal structures are unremarkable.  IMPRESSION: No active cardiopulmonary disease.   Electronically Signed   By: Inez Catalina M.D.   On: 02/12/2013 09:18   Medications:  Scheduled Meds: . [START ON 02/14/2013] aspirin  81 mg Oral Pre-Cath  . aspirin EC  81 mg Oral Daily  . atorvastatin  80 mg Oral q1800  . metoprolol tartrate  25 mg Oral Q8H  . pantoprazole  40 mg Oral Q0600  . Ticagrelor  90 mg Oral BID   Continuous Infusions: . sodium chloride 150 mL/hr at 02/12/13 1545  . sodium chloride 75 mL/hr (02/12/13 2334)  . heparin 1,200 Units/hr (02/13/13 0445)   PRN Meds:.acetaminophen, ALPRAZolam, nitroGLYCERIN, ondansetron (ZOFRAN) IV, sodium chloride, zolpidem Assessment/Plan: 53 y.o male PMH kidney stones, PAF, dyslipidemia, smoking, FH early CAD presented 2/7 around 7 am for substernal chest pain radiating to right jaw and  arm, sweating, sob found to be bradycardic (HR 30s-60s) with heart block, have T  Wave inversions in AVL, inferiorlateral ST segment changes, septal Qs, second troponin + 0.17.  He was found to have NSTEMI.    1. NSTEMI (non-ST elevated myocardial infarction) 2/2 total occlusion promixal to mid RCA -EF 48 % without definitive ischemia on nuclear stress test 1/28 -Trop 0.03>0.17>15.96>20 -cath results 2/7: LVEP: 20.  Left main: Moderate size vessel that trifurcated into the LAD, a ramus intermediate vessel, and a left circumflex coronary artery. There was smooth 30 - 40% distal taper of the left main. LAD: Moderate size vessel that had an 85% somewhat eccentric very proximal stenosis in the region of the first septal and diagonal vessel. The LAD extended to the left ventricular apex. Left circumflex: Angiographically normal vessel with mild luminal irregularity in the region of the trifurcation of a obtuse marginal branch, AV groove circumflex, and left atrial branch. Right coronary artery: Moderate size vessel that was totally occluded in the proximal to mid segment. There was evidence for left-to-right collaterals. He had PCI to the totally occluded right coronary artery with PTCA initially and then with demonstration of diffuse disease proximal to and extending significantly beyond the site of total occlusion, the region was stented with tandem 3.0x38 mm Xience Alpine DES stents postdilated with a taper of 3.26-3.21 mm and with restoration of TIMI 0 to TIMI 3 flow and with the entire region being reduced to 0%.  Left ventriculography revealed an ejection fraction of approximately 50%. There was evidence for mild mid and distal inferior hypocontractility.  -Heparin gtt, Asprin 81 mg, Lipitor 80, Loperssor 25 mg tid, prn NTG, Brilinta 90 mg bid  -prn Zofran  -pending EKG am, pending echo  -pending HA1C  -pt will ultimately need to be staged for PCI to LAD. This will be done 2/9 -echo   2. Bradycardia,  resolved  -noted transient heart block as well.  given Atropine x 1 in the ED.  He also had reperfusion induced marked brady and was tx'ed with Atropine.   -continue to monitor via tele   3. Dyslipidemia Lipid Panel     Component Value Date/Time   CHOL 203* 11/23/2012 0949   TRIG 206* 01/28/2013 0939   TRIG 229* 11/23/2012 0949   HDL 37* 11/23/2012 0949   CHOLHDL 5.5 11/23/2012 0949   VLDL 46* 11/23/2012 0949   LDLCALC 111* 01/28/2013 3419  LDLCALC 120* 11/23/2012 0949   On statin  4. PAF (paroxysmal atrial fibrillation) -currently in Afib and had RVR post procedure and was given Lopressor 2.5 mg x 4. INR 2.32 on admission pt was not on anticoagulation.  INR this am 1.11 -K at goal. Replaced with 2 g Mag. Mag was 1.9  -CHADSVASC 0-1.  Low mod risk stroke  -Cont Lopressor 25 tid  -pending EKG am   5. Tobacco abuse -smoking cessation   6. History of snoring- suspected sleep apnea -may need outpatient sleep study   7. F/E/N -dc IVF  -Mag 1.9 give Mag 2 gx1  -full liq diet changed to cardiac diet   8. DVT px  -Brilinta     LOS: 1 day   Cresenciano Genre, MD [phone] 02/13/2013, 7:25 AM  Patient seen with resident, agree with the above note.  1. CAD: Inferior MI yesterday, treated with tandem DES in RCA.  Has residual 85% pLAD, plan PCI tomorrow 2/9.   - Will arrange for PCI on Monday - Continue ticagrelor/ASA, statin.  - Echo today.  - Continue beta blocker.  Will hold ACEI for now with soft BP, hope to start prior to discharge.  2. Complete heart block: Transient in setting of inferior MI, no further bradycardia since PCI.   3. Atrial fibrillation: He went into atrial fibrillation with procedure yesterday and remains in atrial fibrillation with controlled rate today (on metoprolol). - Transition to Toprol XL for rate control.  - CHADSVASC = 1 (vascular disease).  He will remain on heparin gtt for now.  If atrial fibrillation converts, I do not think I would anticoagulate  him long-term.  If he does not come out of atrial fibrillation on his own, would consider cardioversion prior to discharge.  I would not do this prior to LAD PCI as procedure could potentially trigger recurrence.  4. Smoking cessation counseled.   Loralie Champagne 02/13/2013 8:27 AM

## 2013-02-15 ENCOUNTER — Encounter (HOSPITAL_COMMUNITY): Admission: RE | Payer: Self-pay | Source: Ambulatory Visit

## 2013-02-15 ENCOUNTER — Encounter (HOSPITAL_COMMUNITY)
Admission: EM | Disposition: A | Discharge status: Home / Self Care | Payer: Self-pay | Source: Home / Self Care | Attending: Cardiology

## 2013-02-15 ENCOUNTER — Encounter (HOSPITAL_COMMUNITY): Payer: BC Managed Care – PPO | Admitting: Certified Registered Nurse Anesthetist

## 2013-02-15 ENCOUNTER — Ambulatory Visit (HOSPITAL_COMMUNITY)
Admission: RE | Admit: 2013-02-15 | Payer: BC Managed Care – PPO | Source: Ambulatory Visit | Admitting: Internal Medicine

## 2013-02-15 ENCOUNTER — Inpatient Hospital Stay (HOSPITAL_COMMUNITY): Payer: BC Managed Care – PPO | Admitting: Certified Registered Nurse Anesthetist

## 2013-02-15 ENCOUNTER — Encounter (HOSPITAL_COMMUNITY): Payer: Self-pay | Admitting: *Deleted

## 2013-02-15 DIAGNOSIS — I059 Rheumatic mitral valve disease, unspecified: Secondary | ICD-10-CM

## 2013-02-15 DIAGNOSIS — F17200 Nicotine dependence, unspecified, uncomplicated: Secondary | ICD-10-CM

## 2013-02-15 DIAGNOSIS — Z9861 Coronary angioplasty status: Secondary | ICD-10-CM

## 2013-02-15 DIAGNOSIS — I209 Angina pectoris, unspecified: Secondary | ICD-10-CM

## 2013-02-15 DIAGNOSIS — I200 Unstable angina: Secondary | ICD-10-CM

## 2013-02-15 DIAGNOSIS — I4891 Unspecified atrial fibrillation: Secondary | ICD-10-CM

## 2013-02-15 HISTORY — PX: CARDIOVERSION: SHX1299

## 2013-02-15 HISTORY — PX: TEE WITHOUT CARDIOVERSION: SHX5443

## 2013-02-15 LAB — CBC
HCT: 44 % (ref 39.0–52.0)
Hemoglobin: 14.9 g/dL (ref 13.0–17.0)
MCH: 32.3 pg (ref 26.0–34.0)
MCHC: 33.9 g/dL (ref 30.0–36.0)
MCV: 95.2 fL (ref 78.0–100.0)
PLATELETS: 162 10*3/uL (ref 150–400)
RBC: 4.62 MIL/uL (ref 4.22–5.81)
RDW: 13.8 % (ref 11.5–15.5)
WBC: 11.7 10*3/uL — ABNORMAL HIGH (ref 4.0–10.5)

## 2013-02-15 LAB — BASIC METABOLIC PANEL
BUN: 15 mg/dL (ref 6–23)
CALCIUM: 8.9 mg/dL (ref 8.4–10.5)
CO2: 21 mEq/L (ref 19–32)
Chloride: 108 mEq/L (ref 96–112)
Creatinine, Ser: 0.92 mg/dL (ref 0.50–1.35)
GFR calc Af Amer: >90 mL/min (ref >90)
GLUCOSE: 108 mg/dL — AB (ref 70–99)
Potassium: 4.7 mEq/L (ref 3.7–5.3)
Sodium: 143 mEq/L (ref 137–147)

## 2013-02-15 LAB — HEPARIN LEVEL (UNFRACTIONATED): HEPARIN UNFRACTIONATED: 0.64 [IU]/mL (ref 0.30–0.70)

## 2013-02-15 LAB — PROTIME-INR
INR: 1.07 (ref 0.00–1.49)
Prothrombin Time: 13.7 seconds (ref 11.6–15.2)

## 2013-02-15 LAB — MAGNESIUM: MAGNESIUM: 2.1 mg/dL (ref 1.5–2.5)

## 2013-02-15 SURGERY — LEFT HEART CATHETERIZATION WITH CORONARY ANGIOGRAM
Anesthesia: LOCAL

## 2013-02-15 SURGERY — ECHOCARDIOGRAM, TRANSESOPHAGEAL
Anesthesia: Monitor Anesthesia Care

## 2013-02-15 MED ORDER — SODIUM CHLORIDE 0.9 % IV SOLN
INTRAVENOUS | Status: DC | PRN
  Administered 2013-02-15: 12:00:00 via INTRAVENOUS

## 2013-02-15 MED ORDER — LIDOCAINE HCL (CARDIAC) 20 MG/ML IV SOLN
INTRAVENOUS | Status: DC | PRN
  Administered 2013-02-15: 30 mg via INTRAVENOUS

## 2013-02-15 MED ORDER — SODIUM CHLORIDE 0.9 % IV SOLN
Freq: Once | INTRAVENOUS | Status: AC
  Administered 2013-02-15: 500 mL via INTRAVENOUS

## 2013-02-15 MED ORDER — MIDAZOLAM HCL 10 MG/2ML IJ SOLN
INTRAMUSCULAR | Status: DC | PRN
  Administered 2013-02-15: 11:00:00 1 mg via INTRAVENOUS
  Administered 2013-02-15 (×3): 2 mg via INTRAVENOUS

## 2013-02-15 MED ORDER — ENOXAPARIN SODIUM 100 MG/ML ~~LOC~~ SOLN
100.0000 mg | Freq: Two times a day (BID) | SUBCUTANEOUS | Status: DC
  Administered 2013-02-15 – 2013-02-16 (×2): 100 mg via SUBCUTANEOUS
  Filled 2013-02-15 (×4): qty 1

## 2013-02-15 MED ORDER — LIDOCAINE VISCOUS 2 % MT SOLN
OROMUCOSAL | Status: AC
  Filled 2013-02-15: qty 15

## 2013-02-15 MED ORDER — PROPOFOL 10 MG/ML IV BOLUS
INTRAVENOUS | Status: DC | PRN
  Administered 2013-02-15: 80 mg via INTRAVENOUS

## 2013-02-15 MED ORDER — FENTANYL CITRATE 0.05 MG/ML IJ SOLN
INTRAMUSCULAR | Status: DC | PRN
  Administered 2013-02-15 (×3): 25 ug via INTRAVENOUS

## 2013-02-15 MED ORDER — OFF THE BEAT BOOK
Freq: Once | Status: AC
  Administered 2013-02-15: 23:00:00
  Filled 2013-02-15: qty 1

## 2013-02-15 MED ORDER — BUTAMBEN-TETRACAINE-BENZOCAINE 2-2-14 % EX AERO
INHALATION_SPRAY | CUTANEOUS | Status: DC | PRN
  Administered 2013-02-15: 2 via TOPICAL

## 2013-02-15 MED ORDER — WARFARIN SODIUM 10 MG PO TABS
10.0000 mg | ORAL_TABLET | Freq: Once | ORAL | Status: AC
  Administered 2013-02-15: 10 mg via ORAL
  Filled 2013-02-15: qty 1

## 2013-02-15 MED ORDER — FENTANYL CITRATE 0.05 MG/ML IJ SOLN
INTRAMUSCULAR | Status: AC
  Filled 2013-02-15: qty 2

## 2013-02-15 MED ORDER — ENOXAPARIN (LOVENOX) PATIENT EDUCATION KIT: PACK | Freq: Once | Status: DC

## 2013-02-15 MED ORDER — MIDAZOLAM HCL 5 MG/ML IJ SOLN
INTRAMUSCULAR | Status: AC
  Filled 2013-02-15: qty 2

## 2013-02-15 MED FILL — Sodium Chloride IV Soln 0.9%: INTRAVENOUS | Qty: 50 | Status: AC

## 2013-02-15 NOTE — Progress Notes (Signed)
ANTICOAGULATION CONSULT NOTE - Initial Consult  Pharmacy Consult for lovenox Indication: atrial fibrillation  No Known Allergies  Patient Measurements: Height: 5\' 7"  (170.2 cm) Weight: 229 lb 8 oz (104.1 kg) IBW/kg (Calculated) : 66.1 Heparin Dosing Weight: 104.1 kg  Vital Signs: Temp: 97.6 F (36.4 C) (02/10 1227) Temp src: Oral (02/10 1227) BP: 106/70 mmHg (02/10 1255) Pulse Rate: 65 (02/10 1255)  Labs:  Recent Labs  02/12/13 1352  02/12/13 1825 02/13/13 0026 02/13/13 0120  02/13/13 1835 02/14/13 0409 02/15/13 0642  HGB  --   < >  --   --  14.2  --   --  15.3 14.9  HCT  --   < >  --   --  41.3  --   --  45.0 44.0  PLT  --   --   --   --  164  --   --  163 162  APTT 68*  --   --   --   --   --   --   --   --   LABPROT 14.1  --   --   --   --   --   --   --  13.7  INR 1.11  --   --   --   --   --   --   --  1.07  HEPARINUNFRC  --   --   --   --   --   < > 0.53 1.16* 0.64  CREATININE  --   < >  --   --  0.71  --   --  0.85 0.92  TROPONINI  --   --  15.96* >20.00*  --   --   --   --   --   < > = values in this interval not displayed.  Estimated Creatinine Clearance: 106.8 ml/min (by C-G formula based on Cr of 0.92).   Medical History: Past Medical History  Diagnosis Date  . Nephrolithiasis   . PAF (paroxysmal atrial fibrillation)     during a kidney stone attack  . Dyslipidemia   . CAD (coronary artery disease)     multivessel per cath 02/12/13, s/p stenting to RCA    Medications:  Scheduled:  . aspirin  81 mg Oral Daily  . atorvastatin  80 mg Oral q1800  . clopidogrel  75 mg Oral Q breakfast  . metoprolol succinate  25 mg Oral BID  . pantoprazole  40 mg Oral Q0600  . polyethylene glycol  17 g Oral Daily  . sodium chloride  3 mL Intravenous Q12H  . warfarin  10 mg Oral ONCE-1800  . Warfarin - Pharmacist Dosing Inpatient   Does not apply q1800   Infusions:  . sodium chloride      Assessment: 53 yo male with afib will be changed from heparin to  lovenox as a bridge with coumadin.  Patient was therapeutic with heparin running at 1200 units/hr.  Hgb 14.9 and Plt 162 K  Goal of Therapy:  Anti-Xa level 0.6-1 units/ml 4hrs after LMWH dose given Monitor platelets by anticoagulation protocol: Yes   Plan:  1) Stop heparin drip at 1600 today and start lovenox 100mg  sq q12h, 1st dose at 1700. 2) CBC every 72 hours  Jimesha Rising, Tsz-Yin 02/15/2013,1:27 PM

## 2013-02-15 NOTE — Interval H&P Note (Signed)
History and Physical Interval Note:  02/15/2013 10:50 AM  Paul Todd  has presented today for surgery, with the diagnosis of afib  The various methods of treatment have been discussed with the patient and family. After consideration of risks, benefits and other options for treatment, the patient has consented to  Procedure(s): TRANSESOPHAGEAL ECHOCARDIOGRAM (TEE) (N/A) CARDIOVERSION (N/A) as a surgical intervention .  The patient's history has been reviewed, patient examined, no change in status, stable for surgery.  I have reviewed the patient's chart and labs.  Questions were answered to the patient's satisfaction.     Darden Amber.

## 2013-02-15 NOTE — Transfer of Care (Signed)
Immediate Anesthesia Transfer of Care Note  Patient: Paul Todd  Procedure(s) Performed: Procedure(s): TRANSESOPHAGEAL ECHOCARDIOGRAM (TEE) (N/A) CARDIOVERSION (N/A)  Patient Location: PACU and Endoscopy Unit  Anesthesia Type:General  Level of Consciousness: awake and alert   Airway & Oxygen Therapy: Patient Spontanous Breathing and Patient connected to nasal cannula oxygen  Post-op Assessment: Report given to PACU RN and Post -op Vital signs reviewed and stable  Post vital signs: Reviewed and stable  Complications: No apparent anesthesia complications

## 2013-02-15 NOTE — Discharge Instructions (Addendum)
Information on my medicine - Coumadin   (Warfarin)  This medication education was reviewed with me or my healthcare representative as part of my discharge preparation.  The pharmacist that spoke with me during my hospital stay was:  Ysidro Evert So, [place].D.  Why was Coumadin prescribed for you? Coumadin was prescribed for you because you have a blood clot or a medical condition that can cause an increased risk of forming blood clots. Blood clots can cause serious health problems by blocking the flow of blood to the heart, lung, or brain. Coumadin can prevent harmful blood clots from forming. As a reminder your indication for Coumadin is:   Stroke Prevention Because Of Atrial Fibrillation  What test will check on my response to Coumadin? While on Coumadin (warfarin) you will need to have an INR test regularly to ensure that your dose is keeping you in the desired range. The INR (international normalized ratio) number is calculated from the result of the laboratory test called prothrombin time (PT).  If an INR APPOINTMENT HAS NOT ALREADY BEEN MADE FOR YOU please schedule an appointment to have this lab work done by your health care provider within 7 days. Your INR goal is 2-3  What  do you need to  know  About  COUMADIN? Take Coumadin (warfarin) exactly as prescribed by your healthcare provider about the same time each day.  DO NOT stop taking without talking to the doctor who prescribed the medication.  Stopping without other blood clot prevention medication to take the place of Coumadin may increase your risk of developing a new clot or stroke.  Get refills before you run out.  What do you do if you miss a dose? If you miss a dose, take it as soon as you remember on the same day then continue your regularly scheduled regimen the next day.  Do not take two doses of Coumadin at the same time.  Important Safety Information A possible side effect of Coumadin (Warfarin) is an increased risk of bleeding.  You should call your healthcare provider right away if you experience any of the following:   Bleeding from an injury or your nose that does not stop.   Unusual colored urine (red or dark brown) or unusual colored stools (red or black).   Unusual bruising for unknown reasons.   A serious fall or if you hit your head (even if there is no bleeding).  Some foods or medicines interact with Coumadin (warfarin) and might alter your response to warfarin. To help avoid this:   Eat a balanced diet, maintaining a consistent amount of Vitamin K.   Notify your provider about major diet changes you plan to make.   Avoid alcohol or limit your intake to 1 drink for women and 2 drinks for men per day. (1 drink is 5 oz. wine, 12 oz. beer, or 1.5 oz. liquor.)  Make sure that ANY health care provider who prescribes medication for you knows that you are taking Coumadin (warfarin).  Also make sure the healthcare provider who is monitoring your Coumadin knows when you have started a new medication including herbals and non-prescription products.  Coumadin (Warfarin)  Major Drug Interactions  Increased Warfarin Effect Decreased Warfarin Effect  Alcohol (large quantities) Antibiotics (esp. Septra/Bactrim, Flagyl, Cipro) Amiodarone (Cordarone) Aspirin (ASA) Cimetidine (Tagamet) Megestrol (Megace) NSAIDs (ibuprofen, naproxen, etc.) Piroxicam (Feldene) Propafenone (Rythmol SR) Propranolol (Inderal) Isoniazid (INH) Posaconazole (Noxafil) Barbiturates (Phenobarbital) Carbamazepine (Tegretol) Chlordiazepoxide (Librium) Cholestyramine (Questran) Griseofulvin Oral Contraceptives Rifampin Sucralfate (Carafate) Vitamin  K   Coumadin (Warfarin) Major Herbal Interactions  Increased Warfarin Effect Decreased Warfarin Effect  Garlic Ginseng Ginkgo biloba Coenzyme Q10 Green tea St. Johns wort    Coumadin (Warfarin) FOOD Interactions  Eat a consistent number of servings per week of foods HIGH in Vitamin  K (1 serving =  cup)  Collards (cooked, or boiled & drained) Kale (cooked, or boiled & drained) Mustard greens (cooked, or boiled & drained) Parsley *serving size only =  cup Spinach (cooked, or boiled & drained) Swiss chard (cooked, or boiled & drained) Turnip greens (cooked, or boiled & drained)  Eat a consistent number of servings per week of foods MEDIUM-HIGH in Vitamin K (1 serving = 1 cup)  Asparagus (cooked, or boiled & drained) Broccoli (cooked, boiled & drained, or raw & chopped) Brussel sprouts (cooked, or boiled & drained) *serving size only =  cup Lettuce, raw (green leaf, endive, romaine) Spinach, raw Turnip greens, raw & chopped   These websites have more information on Coumadin (warfarin):  FailFactory.se; VeganReport.com.au;  Radial Site Care Refer to this sheet in the next few weeks. These instructions provide you with information on caring for yourself after your procedure. Your caregiver may also give you more specific instructions. Your treatment has been planned according to current medical practices, but problems sometimes occur. Call your caregiver if you have any problems or questions after your procedure. HOME CARE INSTRUCTIONS  You may shower the day after the procedure.Remove the bandage (dressing) and gently wash the site with plain soap and water.Gently pat the site dry.   Do not apply powder or lotion to the site.   Do not submerge the affected site in water for 3 to 5 days.   Inspect the site at least twice daily.   Do not flex or bend the affected arm for 24 hours.   No lifting over 5 pounds (2.3 kg) for 5 days after your procedure.   Do not drive home if you are discharged the same day of the procedure. Have someone else drive you.   You may drive 24 hours after the procedure unless otherwise instructed by your caregiver.  What to expect:  Any bruising will usually fade within 1 to 2 weeks.   Blood that collects in  the tissue (hematoma) may be painful to the touch. It should usually decrease in size and tenderness within 1 to 2 weeks.  SEEK IMMEDIATE MEDICAL CARE IF:  You have unusual pain at the radial site.   You have redness, warmth, swelling, or pain at the radial site.   You have drainage (other than a small amount of blood on the dressing).   You have chills.   You have a fever or persistent symptoms for more than 72 hours.   You have a fever and your symptoms suddenly get worse.   Your arm becomes pale, cool, tingly, or numb.   You have heavy bleeding from the site. Hold pressure on the site.

## 2013-02-15 NOTE — Progress Notes (Signed)
  Echocardiogram Echocardiogram Transesophageal has been performed.  Philipp Deputy 02/15/2013, 11:56 AM

## 2013-02-15 NOTE — Preoperative (Signed)
Beta Blockers   Reason not to administer Beta Blockers:Not Applicable 

## 2013-02-15 NOTE — Care Management Note (Signed)
    Page 1 of 1   02/15/2013     11:18:31 AM   CARE MANAGEMENT NOTE 02/15/2013  Patient:  KEATEN, MASHEK   Account Number:  [id number]  Date Initiated:  02/15/2013  Documentation initiated by:  [facility]  Subjective/Objective Assessment:   53 y.o male PMH kidney stones, PAF, dyslipidemia, smoking, FH early CAD presented for substernal chest pain radiating to right jaw and arm, sweating, sob, bradycardic  with heart block, NSTEMI/ Home with spouse     Action/Plan:   -Heparin gtt, Asprin 81 mg, Lipitor 80, Loperssor 25 mg tid, prn NTG, Brilinta 90 mg bid / Benefits check for Brilinta and Lovenox   Anticipated DC Date:  02/17/2013   Anticipated DC Plan:  [place]  CM consult      Choice offered to / List presented to:             Status of service:   Medicare Important Message given?   (If response is "NO", the following Medicare IM given date fields will be blank) Date Medicare IM given:   Date Additional Medicare IM given:    Discharge Disposition:    Per UR Regulation:    If discussed at Long Length of Stay Meetings, dates discussed:    Comments:  02/15/13 [place], RN, BSN, [place] [phone] Benefits check for Brilinta= $75.00/ refill; Lovenox benfits check pending.

## 2013-02-15 NOTE — CV Procedure (Signed)
    Transesophageal Echocardiogram Note  Paul Todd [id number] December 15, 1960  Procedure: Transesophageal Echocardiogram Indications: Atrial fib  Procedure Details Consent: Obtained Time Out: Verified patient identification, verified procedure, site/side was marked, verified correct patient position, special equipment/implants available, Radiology Safety Procedures followed,  medications/allergies/relevent history reviewed, required imaging and test results available.  Performed  Medications: Fentanyl: 75 mcg iv Versed: 7  Mg iv  Left Ventrical:  Mild LV dysfunction.  EF 40-45% Mitral Valve: mild MR  Aortic Valve: normal  Tricuspid Valve: normal  Pulmonic Valve: normal  Left Atrium/ Left atrial appendage: no thrombus seen. Vigorous contractility with the a-fib  Atrial septum: no PFO by color doppler  Aorta: normal.   Complications: No apparent complications Patient did tolerate procedure well.      Cardioversion Note  Paul Todd [id number] 1960-12-27  Procedure: DC Cardioversion Indications: atrial fib  Procedure Details Consent: Obtained Time Out: Verified patient identification, verified procedure, site/side was marked, verified correct patient position, special equipment/implants available, Radiology Safety Procedures followed,  medications/allergies/relevent history reviewed, required imaging and test results available.  Performed  The patient has been on adequate anticoagulation.  The patient received IV Lidocaine 30 mg IV followed by Proporol 80 mg  for sedation.  Synchronous cardioversion was performed at 150  joules.  The cardioversion was successful.     Complications: No apparent complications Patient did tolerate procedure well.   Thayer Headings, Brooke Bonito., MD, [facility] 02/15/2013, 11:29 AM

## 2013-02-15 NOTE — Anesthesia Postprocedure Evaluation (Signed)
  Anesthesia Post-op Note  Patient: Paul Todd  Procedure(s) Performed: Procedure(s): TRANSESOPHAGEAL ECHOCARDIOGRAM (TEE) (N/A) CARDIOVERSION (N/A)  Patient Location: PACU and Endoscopy Unit  Anesthesia Type:General  Level of Consciousness: awake and alert   Airway and Oxygen Therapy: Patient Spontanous Breathing  Post-op Pain: none  Post-op Assessment: Post-op Vital signs reviewed  Post-op Vital Signs: stable  Complications: No apparent anesthesia complications

## 2013-02-15 NOTE — H&P (View-Only) (Signed)
    Subjective:  Still notes "gasping for breath" - not related to position; just hard to take deep breath No further CP - has not yet walked. Mild hematuria  No further constipation  Objective:  Vital Signs in the last 24 hours: Temp:  [97.6 F (36.4 C)-98.3 F (36.8 C)] 97.9 F (36.6 C) (02/10 0539) Pulse Rate:  [37-115] 90 (02/10 0539) Resp:  [16-20] 20 (02/10 0539) BP: (96-135)/(57-88) 96/57 mmHg (02/10 0539) SpO2:  [93 %-100 %] 100 % (02/10 0539) Weight:  [229 lb 8 oz (104.1 kg)] 229 lb 8 oz (104.1 kg) (02/10 0500)  Intake/Output from previous day: 02/09 0701 - 02/10 0700 In: 813.2 [P.O.:580; I.V.:233.2] Out: 500 [Urine:500] Intake/Output from this shift:   Physical Exam: General appearance: alert, cooperative, appears stated age and moderately obese Neck: no adenopathy, no carotid bruit and no JVD Lungs: clear to auscultation bilaterally and normal percussion bilaterally Heart: normal apical impulse, irregularly irregular rhythm, S1, S2 normal and No M/R/G - distant sounds Abdomen: soft, non-tender; bowel sounds normal; no masses,  no organomegaly Extremities: extremities normal, atraumatic, no cyanosis or edema Pulses: 2+ and symmetric Neurologic: Grossly normal  Lab Results:  Recent Labs  02/14/13 0409 02/15/13 0642  WBC 13.7* 11.7*  HGB 15.3 14.9  PLT 163 162    Recent Labs  02/13/13 0120 02/14/13 0409  NA 141 142  K 4.2 4.1  CL 106 104  CO2 22 23  GLUCOSE 137* 104*  BUN 16 12  CREATININE 0.71 0.85    Recent Labs  02/12/13 1825 02/13/13 0026  TROPONINI 15.96* >20.00*    Cardiac Studies: Staged PCI note reviewed - PCI to LAD. Tele: remains in rate controlled Afib.  Assessment:  Principal Problem:   NSTEMI (non-ST elevated myocardial infarction) Active Problems:   Unstable angina - Post Infarct Angina with existing LAD disease   CAD S/P percutaneous coronary angioplasty - DES to Mid LAD; 2x 3mm x 38 mm Xience DES to RCA   Family  history of early CAD   Tobacco abuse   PAF (paroxysmal atrial fibrillation)   Dyslipidemia   History of snoring- suspected sleep apnea  Plan:  DAPT converted to ASA & Plavix last PM, ? If gasping dyspnea is related to Brilinta (& reduced overall bleeding risk with Triple Therapy given Afib  Per Dr. Cooper's plan with PAF (persistent) - TEE/DCCV planned for today; continue IV Heparin, Warfarin started last PM -- will need bridging to therapeutic INR s/p DCCV (CHADS2Vasc2 - score 2);  HR well controlled, BP on the low side, but normal  Will need to monitor hematuria - without dysuria, this is likely related to foley trauma.  Continue GI regimen  Smoking cessation counseling ordered  Anticipate several more days to allow INR bridging. At least 1 more day inpatient prior to considering Lovenox.  CM consult to eval for potential use of Lovenox for OP bridging to expedite d/c.   LOS: 3 days    Paul Todd 02/15/2013, 7:32 AM     

## 2013-02-15 NOTE — Progress Notes (Signed)
TR BAND REMOVAL  LOCATION:    right radial  DEFLATED PER PROTOCOL:    yes  TIME BAND OFF / DRESSING APPLIED:    1500   SITE UPON ARRIVAL:    Level 0  SITE AFTER BAND REMOVAL:    Level 0  REVERSE ALLEN'S TEST:     positive  CIRCULATION SENSATION AND MOVEMENT:    Within Normal Limits   yes  COMMENTS:   Gauze dressing applied secured with tegaderm. Rechecked site as ordered at 1530, gauze dressing dry and intact, CSMs wnls, right radial and ulnar pulses present and +2

## 2013-02-15 NOTE — Anesthesia Preprocedure Evaluation (Addendum)
Anesthesia Evaluation  Patient identified by MRN, date of birth, ID band Patient awake    Reviewed: Allergy & Precautions, H&P , NPO status , Patient's Chart, lab work & pertinent test results, reviewed documented beta blocker date and time   Airway Mallampati: II TM Distance: <3 FB Neck ROM: Full    Dental  (+) Dental Advisory Given   Pulmonary Current Smoker,  breath sounds clear to auscultation        Cardiovascular + CAD and + Past MI Rhythm:Irregular Rate:Normal     Neuro/Psych    GI/Hepatic   Endo/Other    Renal/GU      Musculoskeletal   Abdominal (+) + obese,   Peds  Hematology   Anesthesia Other Findings   Reproductive/Obstetrics                          Anesthesia Physical Anesthesia Plan  ASA: III  Anesthesia Plan: MAC   Post-op Pain Management:    Induction: Intravenous  Airway Management Planned: Mask  Additional Equipment:   Intra-op Plan:   Post-operative Plan:   Informed Consent: I have reviewed the patients History and Physical, chart, labs and discussed the procedure including the risks, benefits and alternatives for the proposed anesthesia with the patient or authorized representative who has indicated his/her understanding and acceptance.     Plan Discussed with:   Anesthesia Plan Comments:         Anesthesia Quick Evaluation

## 2013-02-15 NOTE — Progress Notes (Addendum)
Heparin and coumadin per pharmacy  Anticoagulation: S/p cath and PCI to RCA and also s/p staged PCI to LAD 2/9. Angiomax d/ced at 1900 02/07. Heparin drip to restart 8hrs post sheath removal. TR band was removed at 1500. Also starting on coumadin (coumadin score = 9). INR was 1.11 on 02/12/13.  Now is currently on ASA 81, plavix, coumadin and heparin.  Will have TEE/DCCV today.  Mild hematuria.  Heparin level is 0.64 and INR is 1.07  Goal of Therapy:  Heparin level 0.3-0.7 units/ml  Monitor platelets by anticoagulation protocol: Yes  Hem/Onc: Hgb 14.9 and Plt 162 K stable  Plan:  Continue heparin drip at 1200 units/hr.  Repeat heparin level at 1300 for confirmation Coumadin 10 mg po x1 Daily PT/INR Daily HL/CBC  Monitor signs of bleeding

## 2013-02-15 NOTE — Progress Notes (Signed)
CARDIAC REHAB PHASE I   PRE:  Rate/Rhythm: 91 afib with PVC    BP: sitting 105/80    SaO2: 98 2L  MODE:  Ambulation: 440 ft   POST:  Rate/Rhythm: 122 afib    BP: sitting 120/50     SaO2: 99 RA  Tolerated fairly well. Sts he is lightheaded today, thinks it is related to nicotine withdrawal. This did not change with position. Began ed. Pt very receptive. Planning to quit smoking, wife smokes too. Good discussion of diet. Difficult for pt with his travel. Will continue tomorrow. [phone]   Josephina Shih Grandin CES, ACSM 02/15/2013 9:01 AM

## 2013-02-15 NOTE — Progress Notes (Addendum)
    Subjective:  Still notes "gasping for breath" - not related to position; just hard to take deep breath No further CP - has not yet walked. Mild hematuria  No further constipation  Objective:  Vital Signs in the last 24 hours: Temp:  [97.6 F (36.4 C)-98.3 F (36.8 C)] 97.9 F (36.6 C) (02/10 0539) Pulse Rate:  [37-115] 90 (02/10 0539) Resp:  [16-20] 20 (02/10 0539) BP: (96-135)/(57-88) 96/57 mmHg (02/10 0539) SpO2:  [93 %-100 %] 100 % (02/10 0539) Weight:  [229 lb 8 oz (104.1 kg)] 229 lb 8 oz (104.1 kg) (02/10 0500)  Intake/Output from previous day: 02/09 0701 - 02/10 0700 In: 813.2 [P.O.:580; I.V.:233.2] Out: 500 [Urine:500] Intake/Output from this shift:   Physical Exam: General appearance: alert, cooperative, appears stated age and moderately obese Neck: no adenopathy, no carotid bruit and no JVD Lungs: clear to auscultation bilaterally and normal percussion bilaterally Heart: normal apical impulse, irregularly irregular rhythm, S1, S2 normal and No M/R/G - distant sounds Abdomen: soft, non-tender; bowel sounds normal; no masses,  no organomegaly Extremities: extremities normal, atraumatic, no cyanosis or edema Pulses: 2+ and symmetric Neurologic: Grossly normal  Lab Results:  Recent Labs  02/14/13 0409 02/15/13 0642  WBC 13.7* 11.7*  HGB 15.3 14.9  PLT 163 162    Recent Labs  02/13/13 0120 02/14/13 0409  NA 141 142  K 4.2 4.1  CL 106 104  CO2 22 23  GLUCOSE 137* 104*  BUN 16 12  CREATININE 0.71 0.85    Recent Labs  02/12/13 1825 02/13/13 0026  TROPONINI 15.96* >20.00*    Cardiac Studies: Staged PCI note reviewed - PCI to LAD. Tele: remains in rate controlled Afib.  Assessment:  Principal Problem:   NSTEMI (non-ST elevated myocardial infarction) Active Problems:   Unstable angina - Post Infarct Angina with existing LAD disease   CAD S/P percutaneous coronary angioplasty - DES to Mid LAD; 2x 71mm x 38 mm Xience DES to RCA   Family  history of early CAD   Tobacco abuse   PAF (paroxysmal atrial fibrillation)   Dyslipidemia   History of snoring- suspected sleep apnea  Plan:  DAPT converted to ASA & Plavix last PM, ? If gasping dyspnea is related to Brilinta (& reduced overall bleeding risk with Triple Therapy given Afib  Per Dr. Antionette Char plan with PAF (persistent) - TEE/DCCV planned for today; continue IV Heparin, Warfarin started last PM -- will need bridging to therapeutic INR s/p DCCV (CHADS2Vasc2 - score 2);  HR well controlled, BP on the low side, but normal  Will need to monitor hematuria - without dysuria, this is likely related to foley trauma.  Continue GI regimen  Smoking cessation counseling ordered  Anticipate several more days to allow INR bridging. At least 1 more day inpatient prior to considering Lovenox.  CM consult to eval for potential use of Lovenox for OP bridging to expedite d/c.   LOS: 3 days    Barrett Holthaus W 02/15/2013, 7:32 AM

## 2013-02-16 DIAGNOSIS — E785 Hyperlipidemia, unspecified: Secondary | ICD-10-CM

## 2013-02-16 LAB — CBC
HCT: 40.3 % (ref 39.0–52.0)
Hemoglobin: 13.5 g/dL (ref 13.0–17.0)
MCH: 32.2 pg (ref 26.0–34.0)
MCHC: 33.5 g/dL (ref 30.0–36.0)
MCV: 96.2 fL (ref 78.0–100.0)
PLATELETS: 159 10*3/uL (ref 150–400)
RBC: 4.19 MIL/uL — ABNORMAL LOW (ref 4.22–5.81)
RDW: 13.8 % (ref 11.5–15.5)
WBC: 9.8 10*3/uL (ref 4.0–10.5)

## 2013-02-16 LAB — PROTIME-INR
INR: 1.06 (ref 0.00–1.49)
Prothrombin Time: 13.6 seconds (ref 11.6–15.2)

## 2013-02-16 MED ORDER — NITROGLYCERIN 0.4 MG SL SUBL: 0.4000 mg | SUBLINGUAL_TABLET | SUBLINGUAL | Status: DC | PRN

## 2013-02-16 MED ORDER — ATORVASTATIN CALCIUM 80 MG PO TABS: 80.0000 mg | ORAL_TABLET | Freq: Every day | ORAL | Status: DC

## 2013-02-16 MED ORDER — ASPIRIN 81 MG PO TBEC: 81.0000 mg | DELAYED_RELEASE_TABLET | Freq: Every day | ORAL | Status: DC

## 2013-02-16 MED ORDER — METOPROLOL SUCCINATE ER 25 MG PO TB24: 25.0000 mg | ORAL_TABLET | Freq: Two times a day (BID) | ORAL | Status: DC

## 2013-02-16 MED ORDER — CLOPIDOGREL BISULFATE 75 MG PO TABS: 75.0000 mg | ORAL_TABLET | Freq: Every day | ORAL | Status: DC

## 2013-02-16 MED ORDER — WARFARIN SODIUM 2.5 MG PO TABS: 12.5000 mg | ORAL_TABLET | Freq: Once | ORAL | Status: DC

## 2013-02-16 MED ORDER — ENOXAPARIN SODIUM 100 MG/ML ~~LOC~~ SOLN: 100.0000 mg | Freq: Two times a day (BID) | SUBCUTANEOUS | Status: DC

## 2013-02-16 MED ORDER — WARFARIN SODIUM 2.5 MG PO TABS
12.5000 mg | ORAL_TABLET | Freq: Once | ORAL | Status: DC
  Filled 2013-02-16: qty 1

## 2013-02-16 MED ORDER — PANTOPRAZOLE SODIUM 40 MG PO TBEC: 40.0000 mg | DELAYED_RELEASE_TABLET | Freq: Every day | ORAL | Status: DC

## 2013-02-16 NOTE — Discharge Summary (Signed)
Discharge Summary   Patient ID: Paul Todd MRN: [id number], DOB/AGE: 1960/02/21 53 y.o. Admit date: 02/12/2013 D/C date:     02/16/2013  Primary Cardiologist: Dr. Rennis Golden  Discharge diagnosis   NSTEMI - s/p DES to RCA and s/p staged PTCA/DES x 1 proximal LAD  Active Problems:   PAF (paroxysmal atrial fibrillation)   Tobacco abuse   History of snoring- suspected sleep apnea   Dyslipidemia  Hospital Course:Paul Todd is a 53 y.o. male with a history of kidney stones, PAF, dyslipidemia and tobacco abuse who presented on 02/12/13 with a NSTEMI. He is followed by Dr Rennis Golden and had recently been seen in the office for chest pain. He underwent a Myoview on 02/02/13 which returned abnormal with an EF of 48%, but there was no ischemia. He was then scheduled for an elective cardiac cath for the next week. However, on 02/12/13 he woke up with SSCP "tightness" associated with diaphoresis, SOB, and radiation to his jaw and right arm and proceeded to the ED.   While in the ED the patient developed nausea and was noted to be bradycardic with HR in the 30s and SBP dropped to the 50s. ECG showed complete heart block but with a narrow escape. He was given atropine 0.5 mg with rise in HR to the 70s, NSR. Patient's second troponin returned abnormal at 0.17 and continued to climb >20. His episode of complete heart block was suspected to be vagally mediated from nausea; however, it was thought that RCA ischemia needed to be ruled out completely. It was decided that he proceed with a cardiac cath that day.  Cardiac cath on 02/12/13 revealed an acutely occluded RCA which was successfully treated with PTCA and diffuse stenting due to RCA stenoses beyond the point of total occlusion. The patient also had high-grade concomitant proximal LAD disease which required staged PCI. Left ventriculography revealed an ejection fraction of ~50%. There was evidence for mild mid and distal inferior hypocontractility as well. The  patient developed atrial fibrillation during the procedure which was rate controlled with an IV beta blocker and then a low dose oral agent. He underwent staged PCI to LAD on 02/14/13 with successful PTCA/DES x 1 to proximal LAD and he was placed on ASA/Brilinta.   It was felt that the patient would not tolerate atrial fibrillation well and underwentTEE/DCCV on 02/15/13. He is now in NSR and on triple therapy with Lovenox for bridging. The patient had some issues with hematuria (likely from foley cath related injury) prompting more serious discission about his anticoagulation status. Dr. Excell Seltzer reviewed his options considering his recent MI and multivessel stenting as well as indication for anticoagulation with atrial fib and felt it best that he continue ASA 81 mg, switch brilinta to plavix and continue heparin/warfarin overlap, which would be limited to 3-6 months depending on his clinical course. Of note patient also had some shortness of breath on Brilinta, which was resolved when he was switched to Plavix. It was decided to put him on Lovenox for bridging to expedite discharge.   The patient is currently feeling fine and ready to go home. He has been having frequent ventricular ectopy but he has been deemed stable per Dr. Allyson Sabal to be discharged today. He will go home on ASA/Plavix, low dose BB, statin and protonix. ACE was held secondary to soft blood pressure. He has been instructed to take Coumadin 12.5 qd and Lovenox bid for the next two days until his outpatient INR check  and consultation with pharmacist on Friday. His INR is still sub-theraptuic today at 1.06. He will follow up with an APP within the next 2 weeks and [name] thereafter. Tobacco cessation was emphasized many times during this admission.   Outstanding labs and diagnostic testing- patient should proceed with work up for OSA.    Discharge Vitals: Blood pressure 123/78, pulse 63, temperature 97.7 F (36.5 C), temperature source Oral,  resp. rate 18, height 5\' 7"  (1.702 m), weight 231 lb 0.7 oz (104.8 kg), SpO2 97.00%.  Labs: Lab Results  Component Value Date   WBC 9.8 02/16/2013   HGB 13.5 02/16/2013   HCT 40.3 02/16/2013   MCV 96.2 02/16/2013   PLT 159 02/16/2013     Recent Labs Lab 02/15/13 0642  NA 143  K 4.7  CL 108  CO2 21  BUN 15  CREATININE 0.92  CALCIUM 8.9  GLUCOSE 108*    Lab Results  Component Value Date   CHOL 203* 11/23/2012   HDL 37* 11/23/2012   LDLCALC 111* 01/28/2013   TRIG 206* 01/28/2013     Diagnostic Studies/Procedures  Cardiac Catheterization Operative Report  Paul Todd  [id number]  2/9/201511:03 AM  [name], MD  Procedure Performed:  1. PTCA/DES x 1 proximal LAD Operator: Lauree Chandler, MD  Arterial access site: Right radial artery.  Indication: 53 yo male with history of Tobacco abuse, HLD, atrial fibrillation and CAD who is here today for staged PCI of LAD. Admitted 02/12/13 with chest pain/NSTEMI. Cardiac cath per Dr. Claiborne Billings with occluded RCA, treated with overlapping DES in the proximal and mid RCA. Pt also found to have severe proximal LAD stenosis. He has had continued chest pain and dyspnea over the last 36 hours. PCI of LAD is indicated for unstable angina.  Procedure Details:  The risks, benefits, complications, treatment options, and expected outcomes were discussed with the patient. The patient and/or family concurred with the proposed plan, giving informed consent. The patient was brought to the cath lab after IV hydration was begun and oral premedication was given. The patient was further sedated with Versed and Fentanyl. The right wrist was assessed with an Allens test which was positive. The right wrist was prepped and draped in a sterile fashion. 1% lidocaine was used for local anesthesia. Using the modified Seldinger access technique, a 5/6 French sheath was placed in the right radial artery. 3 mg Verapamil was given through the sheath. A weight based  bolus of Angiomax was given and a drip was started. When the ACT was over 200, I engaged the left main with a XB LAD 3.5 guiding catheter. I then passed a Cougar IC wire down the LAD. A 2.5 x 12 mm balloon was used to pre-dilate the calcified proximal stenosis. A 2.75 x 20 mm Promus Premier DES was then carefully positioned and deployed in the proximal LAD. The stent was post-dilated with a 3.0 x 15 mm Pope balloon x 1. There was an excellent angiographic result. The stenosis was taken from 90% down to 0%. The sheath was removed from the right radial artery and a Terumo hemostasis band was applied at the arteriotomy site on the right wrist.  There were no immediate complications. The patient was taken to the recovery area in stable condition.  Hemodynamic Findings:  Central aortic pressure: 115/78  Impression:  1. Severe stenosis proximal LAD with ongoing chest pain and dyspnea c/w unstable angina.  2. Successful PTCA/DES x 1 proximal LAD  Recommendations: He  will be continued on ASA and Brilinta, beta blocker and statin. Will resume IV heparin 8 hours post sheath pull. If he is started on coumadin for his atrial fibrillation, will need to consider stopping ASA and continuing Brilinta and coumadin.  Complications: None. The patient tolerated the procedure well.    CARDIAC CATHETERIZATION  HISTORY: Paul Todd is a 53 year old male who has recently developed some intermittent chest pain. A recent Myoview study was abnormal with an ejection fraction of 48% without definitive ischemia. The patient was scheduled to have an outpatient cardiac catheterization in the upcoming week. This morning he was awakened from sleep at around 6:45 AM with substernal chest tightness associated with diaphoresis shortness of breath and jaw radiation. He presented to the emergency room. His ECG were showing evolving ST segment changes. In the emergency room he developed bradycardia with heart rates in the [id number] and systolic  blood pressure dropped to the 50s and he developed transient heart block and was given atropine. He is now taken acutely to the cardiac catheterization laboratory.  PROCEDURE:  The patient was brought to the second floor [facility] cath lab in the postabsorptive state. Upon arrival to the catheterization laboratory the patient was still having 3/10 chest pain which was improved from earlier when it was 10/10. He was given 4000 units of heparin and 325 mg aspirinin the emergency room prior to transfer to the cath lab. He was prepped and draped in sterile fashion. Versed 2 mg and fentanyl 50 mcg were ministered for conscious sedation. His right femoral artery was punctured anteriorly and a 6 French sheath was inserted without difficulty. Catheterization was done with 5 [place] SL 4 diagnostic catheter and with a high suspicion of total RCA occlusion a 6 French right guiding catheter with side holes was used for selective angiography right renal artery. This confirmed total RCA occlusion. Angiomax bolus plus infusion was administered and patient received brilinta 180 mg orally. An Asahi medium wire was advanced down the right cardiography and was able to cross the total occlusion. Reperfusion, the patient became markedly bradycardic and dropped his heart rate to 38. He was treated with atropine 1 mg. There was diffuse stenoses beyond the occlusion. Initially, a 2.0x12 mm Emerge balloon was used for initial dilatation. Due to the diffuse RCA disease a 2.5x25 mm Trek balloon was then inserted and 2 inflations at 8 and 9 atmospheres were performed. It was felt that he would need to be a long area of stenting to cover the diffuse stenosis. A 3.0x38 mm size Xience Alpine DES stent was then inserted and advanced to the distal RCA before the PDA takeoff and was positioned where the vessel seemed to normalize. This was dilated x2 at 10 and 12 atmospheres. A second Xience Alpine DES 3.0x38 mm stent was then inserted a  tandem fashion proximally and extended to the proximal RCA to cover the entire stenosed region. This was dilated x2 at 13 and 14 atmospheres. The patient at this time developed rapid atrial fibrillation with a rate in the 140s. IV Lopressor at 2.5 mg intervals for a total of 4 injections (total dose 10 mg ) were made with improvement in the ventricular rate into the 90s. An Young Emerge 3.25x30 mm balloon was used for post stent dilatation with stent taper from 3.26 proximally to 3.21 mm distally. The vessel is a large caliber vessel and there was brisk TIMI-3 flow in the diffuse stenoses being reduced to 0%. The wire balloon and  guiding catheters were then removed and a 5 French pigtail catheter was inserted and advanced into the left ventricle. RAO left ventriculography was performed and the catheter was then pulled back successfully into the central aorta. The patient at this time had stable hemodynamics with a heart rate in the 80s to 90s in atrial fibrillation and with adequate blood pressure. His chest pain had almost completely resolved. Arterial sheath was sutured in place with plans for continuation of Angiomax infusion for 4 hours post this acute intervention. He was transported to the coronary care unit in stable condition.  HEMODYNAMICmus intermediate vessel, and a left circumflex coS:  Central Aorta: 100/69  Left Ventricle: 100/12/20  ANGIOGRAPHderate size vessel that trifurcated into the LAD, a raronary artery. There was smooth 30 - 40% distal taper of the left main.  2. LAD: Moderate size vessel that had an 85% somewhat eccentric very proximal stenosis in the region of Y:  1. Left main: Mothe first septal and diagonal vessel. The LAD extended to the left ventricular apex.  3. Ramus Intermediate: Angiographically normal vessel  4. Left circumflex: Angiographically normal vessel with mild luminal irregularity in the region of the trifurcation of a obtuse marginal branch, AV groove circumflex, and  left atrial branch..  5. Right coronary artery: Moderate size vessel that was totally occluded in the proximal to mid segment. There was evidence for left-to-right collaterals.  Following emergent percutaneous cardiac intervention to the totally occluded right coronary artery with PTCA initially and then with demonstration of diffuse disease proximal to and extending significantly beyond the site of total occlusion, the region was stented with tandem 3.0x38 mm Xience Alpine DES stents postdilated with a taper of 3.26-3.21 mm and with restoration of TIMI 0 to TIMI 3 flow and with the entire region being reduced to 0%.  Left ventriculography revealed an ejection fraction of approximately 50%. There was evidence for mild mid and distal inferior hypocontractility.  IMPRESSION:  Low normal to mildly impaired LV dysfunction with an ejection fraction of approximately 50% and evidence for mid distal inferior hypocontractility.  Acute Coronary syndrome secondary to total occlusion of the proximal to midright coronary artery initial TIMI 0 flow.  Multivessel CAD with evidence for 30-40% smooth narrowing in the distal left main, an 85% eccentric proximal LAD stenosis in addition to the acute infarct related totally occluded right coronary artery.  Reperfusion induced marked bradycardia to a heart rate of 38 treated with atropine.  Successful percutaneous coronary intervention of the totally occluded) artery, and once opened was for diffuse stenoses of 90 and 80% proximal insertion of tandem 3.0x38 mm Xience Alpine DES stents postdilated with a stent taper from 3.26 proximally to 3.21 one distally with TIMI 0 flow initially been restored to TIMI 3 flow in the entire region being reduced to 0%.  Atrial fibrillation with rapid ventricular response treated with IV Lopressor 2.5 mg x4 for a total dose of 10 mg.  Anticoagulation with Angiomax and antiplatelet therapy with brilinta 180 mg/ASA and IC nitroglycerin    RECOMMENDATION:  Paul Todd presented today with acute coronary syndrome with an initial troponin negative but the second troponin was positive at 0.17 and he developed evolutionary inferolateral ST segment changes. Catheterization reveals an acutely occluded right coronary artery which was successfully treated with PTCA and diffuse stenting due to diffuse RCA stenoses beyond the point of total occlusion. The patient does have high-grade concomitant proximal LAD disease and ultimately will need to be staged PCI to his LAD. The  patient currently is in atrial fibrillation with a controlled rate after IV beta blocker therapy. Following Angiomax infusion and sheath pull in he will be started on heparin therapy as well as additional medical therapy. He was counseled on the importance of smoking cessation.   Dg Chest 2 View  02/12/2013   CLINICAL DATA:  Substernal chest pain  EXAM: CHEST  2 VIEW  COMPARISON:  02/10/2013  FINDINGS: The heart size and mediastinal contours are within normal limits. Both lungs are clear. The visualized skeletal structures are unremarkable.  IMPRESSION: No active cardiopulmonary disease.     Dg Chest 2 View  02/10/2013   CLINICAL DATA:  Chest pain. Pre catheterization. History of smoking.  EXAM: CHEST  2 VIEW  COMPARISON:  10/12/2009 outside radiographs -no report.  FINDINGS: The heart size and mediastinal contours are stable. There are lower lung volumes with increased central interstitial prominence, possibly related to smoking. No edema, confluent airspace opacity or pleural effusion is identified. The osseous structures appear normal.  IMPRESSION: Mild central interstitial prominence or airway thickening, possibly related to smoking. No acute cardiopulmonary process demonstrated.      Discharge Medications     Medication List    STOP taking these medications       ALEVE 220 MG tablet  Generic drug:  naproxen sodium     aspirin 325 MG tablet  Replaced by:  aspirin  81 MG EC tablet     omeprazole 20 MG tablet  Commonly known as:  PRILOSEC OTC      TAKE these medications       aspirin 81 MG EC tablet  Commonly known as:  ASPIRIN LOW DOSE  Take 1 tablet (81 mg total) by mouth daily. Swallow whole.     atorvastatin 80 MG tablet  Commonly known as:  LIPITOR  Take 1 tablet (80 mg total) by mouth daily.     clopidogrel 75 MG tablet  Commonly known as:  PLAVIX  Take 1 tablet (75 mg total) by mouth daily with breakfast.     enoxaparin 100 MG/ML injection  Commonly known as:  LOVENOX  Inject 1 mL (100 mg total) into the skin every 12 (twelve) hours.     metoprolol succinate 25 MG 24 hr tablet  Commonly known as:  TOPROL-XL  Take 1 tablet (25 mg total) by mouth 2 (two) times daily.     nitroGLYCERIN 0.4 MG SL tablet  Commonly known as:  NITROSTAT  Place 1 tablet (0.4 mg total) under the tongue every 5 (five) minutes x 3 doses as needed for chest pain.     pantoprazole 40 MG tablet  Commonly known as:  PROTONIX  Take 1 tablet (40 mg total) by mouth daily.     warfarin 2.5 MG tablet  Commonly known as:  COUMADIN  Take 5 tablets (12.5 mg total) by mouth one time only at 6 PM.        Disposition   The patient will be discharged in stable condition to home. Discharge Orders   Future Appointments Provider Department Dept Phone   02/18/2013 10:00 AM Tommy Medal, RPH-CPP [facility] [id number]   03/03/2013 9:00 AM Tarri Fuller, PA-C [facility] [phone]   Future Orders Complete By Expires   Amb Referral to Cardiac Rehabilitation  As directed    Diet - low sodium heart healthy  As directed    Increase activity slowly  As directed      Follow-up Information   Follow up  with Tarri Fuller, PA-C On 03/03/2013. (@ 9am)    Specialty:  Physician Assistant   Contact information:   [address] [place] [address] [phone]       Follow up with Tommy Medal, [place] On 02/18/2013. (@  10am at [facility] office for coumadin check and consultation with Cyril Mourning, the pharmacist, to go over new medications )    Contact information:   [address] [place] [address] [phone]         Duration of Discharge Encounter: Greater than 30 minutes including physician and PA time.  SignedVertell Limber, Jenai Scaletta PA-C 02/16/2013, 11:07 AM

## 2013-02-16 NOTE — Progress Notes (Signed)
CARDIAC REHAB PHASE I   PRE:  Rate/Rhythm: 80 bigiminy, then 60 SR    BP: sitting 132/80    SaO2:   MODE:  Ambulation: 440 ft   POST:  Rate/Rhythm: 74 SR with only occ PVC    BP: sitting 155/72     SaO2:   Tolerated well. Was in bigeminy when I arrived for about 15 seconds, then NSR with occ PVC. No further bigeminy with walking. Ed completed.  Interested in [place] if he can get in soon and his insurance will pay. He will call. Will refer to [place]. [phone]  Josephina Shih [place] CES, ACSM 02/16/2013 9:45 AM

## 2013-02-16 NOTE — Progress Notes (Signed)
Patient Name: Paul Todd Date of Encounter: 02/16/2013     Principal Problem:   NSTEMI (non-ST elevated myocardial infarction) Active Problems:   Family history of early CAD   Tobacco abuse   History of snoring- suspected sleep apnea   PAF (paroxysmal atrial fibrillation)   Dyslipidemia   Unstable angina - Post Infarct Angina with existing LAD disease   CAD S/P percutaneous coronary angioplasty - DES to Mid LAD; 2x 51mm x 38 mm Xience DES to RCA    SUBJECTIVE  He is having some mild chest "twinges" that is different than previous chest pain. Some SOB but much improved off Brilenta. Some light headedness and dizziness- this sounds mostly orthostatic. No LE edema, orthopnea, PND. Does not like the lovenox injections but will continue. Had a 2.06 sec pause last night ~2am, pt denied CP, SOB, vital signs stable. Tele with freq PVCs and pauses.   CURRENT MEDS . aspirin  81 mg Oral Daily  . atorvastatin  80 mg Oral q1800  . clopidogrel  75 mg Oral Q breakfast  . enoxaparin (LOVENOX) injection  100 mg Subcutaneous Q12H  . metoprolol succinate  25 mg Oral BID  . pantoprazole  40 mg Oral Q0600  . polyethylene glycol  17 g Oral Daily  . sodium chloride  3 mL Intravenous Q12H  . Warfarin - Pharmacist Dosing Inpatient   Does not apply q1800    OBJECTIVE  Filed Vitals:   02/15/13 2358 02/16/13 0007 02/16/13 0500 02/16/13 0627  BP: 93/64   101/68  Pulse: 75   57  Temp: 97.4 F (36.3 C)   98 F (36.7 C)  TempSrc: Oral   Oral  Resp: 18   20  Height:      Weight:  231 lb 0.7 oz (104.8 kg) 231 lb 0.7 oz (104.8 kg)   SpO2: 93%   97%    Intake/Output Summary (Last 24 hours) at 02/16/13 0705 Last data filed at 02/16/13 [id number]  Gross per 24 hour  Intake 409.33 ml  Output    950 ml  Net -540.67 ml   Filed Weights   02/15/13 0500 02/16/13 0007 02/16/13 0500  Weight: 229 lb 8 oz (104.1 kg) 231 lb 0.7 oz (104.8 kg) 231 lb 0.7 oz (104.8 kg)    PHYSICAL EXAM  General:  Pleasant, NAD. Neuro: Alert and oriented X 3. Moves all extremities spontaneously. Psych: Normal affect. HEENT:  Normal  Neck: Supple without bruits or JVD. Lungs:  Resp regular and unlabored, CTA. Heart: RRR no s3, s4, or murmurs. Abdomen: Soft, non-tender, non-distended, BS + x 4.  Extremities: No clubbing, cyanosis or edema. DP/PT/Radials 2+ and equal bilaterally.  Accessory Clinical Findings  CBC  Recent Labs  02/15/13 0642 02/16/13 0250  WBC 11.7* 9.8  HGB 14.9 13.5  HCT 44.0 40.3  MCV 95.2 96.2  PLT 162 [id number]   Basic Metabolic Panel  Recent Labs  02/14/13 0409 02/15/13 0642  NA 142 143  K 4.1 4.7  CL 104 108  CO2 23 21  GLUCOSE 104* 108*  BUN 12 15  CREATININE 0.85 0.92  CALCIUM 8.6 8.9  MG  --  2.1    Hemoglobin A1C  Recent Labs  02/13/13 1000  HGBA1C 5.8*   Fasting Lipid   TELE  NSR with freq    Radiology/Studies  Dg Chest 2 View  02/12/2013   CLINICAL DATA:  Substernal chest pain  EXAM: CHEST  2 VIEW  COMPARISON:  02/10/2013  FINDINGS:  The heart size and mediastinal contours are within normal limits. Both lungs are clear. The visualized skeletal structures are unremarkable.  IMPRESSION: No active cardiopulmonary disease.     Dg Chest 2 View  02/10/2013   CLINICAL DATA:  Chest pain. Pre catheterization. History of smoking.  EXAM: CHEST  2 VIEW  COMPARISON:  10/12/2009 outside radiographs -no report.  FINDINGS: The heart size and mediastinal contours are stable. There are lower lung volumes with increased central interstitial prominence, possibly related to smoking. No edema, confluent airspace opacity or pleural effusion is identified. The osseous structures appear normal.  IMPRESSION: Mild central interstitial prominence or airway thickening, possibly related to smoking. No acute cardiopulmonary process demonstrated.    CARDIAC CATHETERIZATION  HISTORY: [name] is a 53 year old male who has recently developed some intermittent chest  pain. A recent Myoview study was abnormal with an ejection fraction of 48% without definitive ischemia. The patient was scheduled to have an outpatient cardiac catheterization in the upcoming week. This morning he was awakened from sleep at around 6:45 AM with substernal chest tightness associated with diaphoresis shortness of breath and jaw radiation. He presented to the emergency room. His ECG were showing evolving ST segment changes. In the emergency room he developed bradycardia with heart rates in the 30Z and systolic blood pressure dropped to the 50s and he developed transient heart block and was given atropine. He is now taken acutely to the cardiac catheterization laboratory.  PROCEDURE:  The patient was brought to the second floor [facility]iac cath lab in the postabsorptive state. Upon arrival to the catheterization laboratory the patient was still having 3/10 chest pain which was improved from earlier when it was 10/10. He was given 4000 units of heparin and 325 mg aspirinin the emergency room prior to transfer to the cath lab. He was prepped and draped in sterile fashion. Versed 2 mg and fentanyl 50 mcg were ministered for conscious sedation. His right femoral artery was punctured anteriorly and a 6 French sheath was inserted without difficulty. Catheterization was done with 5 [place] SL 4 diagnostic catheter and with a high suspicion of total RCA occlusion a 6 French right guiding catheter with side holes was used for selective angiography right renal artery. This confirmed total RCA occlusion. Angiomax bolus plus infusion was administered and patient received brilinta 180 mg orally. An [place]ahi medium wire was advanced down the right cardiography and was able to cross the total occlusion. Reperfusion, the patient became markedly bradycardic and dropped his heart rate to 38. He was treated with atropine 1 mg. There was diffuse stenoses beyond the occlusion. Initially, a 2.0x12 mm Emerge balloon was  used for initial dilatation. Due to the diffuse RCA disease a 2.5x25 mm Trek balloon was then inserted and 2 inflations at 8 and 9 atmospheres were performed. It was felt that he would need to be a long area of stenting to cover the diffuse stenosis. A 3.0x38 mm size Xience Alpine DES stent was then inserted and advanced to the distal RCA before the PDA takeoff and was positioned where the vessel seemed to normalize. This was dilated x2 at 10 and 12 atmospheres. A second Xience Alpine DES 3.0x38 mm stent was then inserted a tandem fashion proximally and extended to the proximal RCA to cover the entire stenosed region. This was dilated x2 at 13 and 14 atmospheres. The patient at this time developed rapid atrial fibrillation with a rate in the 140s. IV Lopressor at 2.5 mg intervals  for a total of 4 injections (total dose 10 mg ) were made with improvement in the ventricular rate into the 90s. An [place] Emerge 3.25x30 mm balloon was used for post stent dilatation with stent taper from 3.26 proximally to 3.21 mm distally. The vessel is a large caliber vessel and there was brisk TIMI-3 flow in the diffuse stenoses being reduced to 0%. The wire balloon and guiding catheters were then removed and a 5 French pigtail catheter was inserted and advanced into the left ventricle. RAO left ventriculography was performed and the catheter was then pulled back successfully into the central aorta. The patient at this time had stable hemodynamics with a heart rate in the 80s to 90s in atrial fibrillation and with adequate blood pressure. His chest pain had almost completely resolved. Arterial sheath was sutured in place with plans for continuation of Angiomax infusion for 4 hours post this acute intervention. He was transported to the coronary care unit in stable condition.  HEMODYNAMICS:  Central Aorta: 100/69  Left Ventricle: 100/12/20  ANGIOGRAPHY:  1. Left main: Moderate size vessel that trifurcated into the LAD, a ramus  intermediate vessel, and a left circumflex coronary artery. There was smooth 30 - 40% distal taper of the left main.  2. LAD: Moderate size vessel that had an 85% somewhat eccentric very proximal stenosis in the region of the first septal and diagonal vessel. The LAD extended to the left ventricular apex.  3. Ramus Intermediate: Angiographically normal vessel  4. Left circumflex: Angiographically normal vessel with mild luminal irregularity in the region of the trifurcation of a obtuse marginal branch, AV groove circumflex, and left atrial branch..  5. Right coronary artery: Moderate size vessel that was totally occluded in the proximal to mid segment. There was evidence for left-to-right collaterals.  Following emergent percutaneous cardiac intervention to the totally occluded right coronary artery with PTCA initially and then with demonstration of diffuse disease proximal to and extending significantly beyond the site of total occlusion, the region was stented with tandem 3.0x38 mm Xience Alpine DES stents postdilated with a taper of 3.26-3.21 mm and with restoration of TIMI 0 to TIMI 3 flow and with the entire region being reduced to 0%.  Left ventriculography revealed an ejection fraction of approximately 50%. There was evidence for mild mid and distal inferior hypocontractility. IMPRESSION:  Low normal to mildly impaired LV dysfunction with an ejection fraction of approximately 50% and evidence for mid distal inferior hypocontractility.  Acute Coronary syndrome secondary to total occlusion of the proximal to midright coronary artery initial TIMI 0 flow.  Multivessel CAD with evidence for 30-40% smooth narrowing in the distal left main, an 85% eccentric proximal LAD stenosis in addition to the acute infarct related totally occluded right coronary artery.  Reperfusion induced marked bradycardia to a heart rate of 38 treated with atropine.  Successful percutaneous coronary intervention of the totally  occluded) artery, and once opened was for diffuse stenoses of 90 and 80% proximal insertion of tandem 3.0x38 mm Xience Alpine DES stents postdilated with a stent taper from 3.26 proximally to 3.21 one distally with TIMI 0 flow initially been restored to TIMI 3 flow in the entire region being reduced to 0%.  Atrial fibrillation with rapid ventricular response treated with IV Lopressor 2.5 mg x4 for a total dose of 10 mg.  Anticoagulation with Angiomax and antiplatelet therapy with brilinta 180 mg/ASA and IC nitroglycerin   Cardiac Catheterization Operative Report  CHURCHILL GRIMSLEY  [id number]  2/9/201511:03  AM  DAUB, STEVE A, MD  Procedure Performed:  1. PTCA/DES x 1 proximal LAD Operator: Lauree Chandler, MD  Arterial access site: Right radial artery.  Indication: 53 yo male with history of Tobacco abuse, HLD, atrial fibrillation and CAD who is here today for staged PCI of LAD. Admitted 02/12/13 with chest pain/NSTEMI. Cardiac cath per Dr. Claiborne Billings with occluded RCA, treated with overlapping DES in the proximal and mid RCA. Pt also found to have severe proximal LAD stenosis. He has had continued chest pain and dyspnea over the last 36 hours. PCI of LAD is indicated for unstable angina.  Procedure Details:  The risks, benefits, complications, treatment options, and expected outcomes were discussed with the patient. The patient and/or family concurred with the proposed plan, giving informed consent. The patient was brought to the cath lab after IV hydration was begun and oral premedication was given. The patient was further sedated with Versed and Fentanyl. The right wrist was assessed with an Allens test which was positive. The right wrist was prepped and draped in a sterile fashion. 1% lidocaine was used for local anesthesia. Using the modified Seldinger access technique, a 5/6 French sheath was placed in the right radial artery. 3 mg Verapamil was given through the sheath. A weight based bolus of  Angiomax was given and a drip was started. When the ACT was over 200, I engaged the left main with a XB LAD 3.5 guiding catheter. I then passed a Cougar IC wire down the LAD. A 2.5 x 12 mm balloon was used to pre-dilate the calcified proximal stenosis. A 2.75 x 20 mm Promus Premier DES was then carefully positioned and deployed in the proximal LAD. The stent was post-dilated with a 3.0 x 15 mm [place] balloon x 1. There was an excellent angiographic result. The stenosis was taken from 90% down to 0%. The sheath was removed from the right radial artery and a Terumo hemostasis band was applied at the arteriotomy site on the right wrist.  There were no immediate complications. The patient was taken to the recovery area in stable condition.  Hemodynamic Findings:  Central aortic pressure: 115/78  Impression:  1. Severe stenosis proximal LAD with ongoing chest pain and dyspnea c/w unstable angina.  2. Successful PTCA/DES x 1 proximal LAD  Recommendations: He will be continued on ASA and Brilinta, beta blocker and statin. Will resume IV heparin 8 hours post sheath pull. If he is started on coumadin for his atrial fibrillation, will need to consider stopping ASA and continuing Brilinta and coumadin.   RECOMMENDATION:  Mr. Haverty presented today with acute coronary syndrome with an initial troponin negative but the second troponin was positive at 0.17 and he developed evolutionary inferolateral ST segment changes. Catheterization reveals an acutely occluded right coronary artery which was successfully treated with PTCA and diffuse stenting due to diffuse RCA stenoses beyond the point of total occlusion. The patient does have high-grade concomitant proximal LAD disease and ultimately will need to be staged PCI to his LAD. The patient currently is in atrial fibrillation with a controlled rate after IV beta blocker therapy. Following Angiomax infusion and sheath pull in he will be started on heparin therapy as well as  additional medical therapy. He was counseled on the importance of smoking cessation.  ASSESSMENT AND PLAN 53 y.o male PMH kidney stones, PAF, dyslipidemia, smoking, FH early CAD presented on 02/12/13 with NSTEMI. s/p DES to RCA and S/p staged PTCA/DES x 1 proximal LAD   NSTEMI-  s/p DES to RCA and S/p staged PTCA/DES x 1 proximal LAD  - DAPT converted from [place] to ASA & Plavix 2/2 gasping dyspnea possibly related to Brilinta (& reduced overall bleeding risk with Triple Therapy given Afib. SOB improved off Brilenta  Afib with RVR- TEE/DCCV successful yesterday. Warfarin started 2/9-- will need bridging to therapeutic INR s/p DCCV (CHADS2Vasc2 - score 2); - cont warfarin and Lovenox  - INR sub theraputic (1.06).  - heparin gtt d/c'd   Complete heart block: Transient in setting of inferior MI, no further bradycardia since PCI.  - continue to monitor via tele  - on metop succinate and tolerating   HLD- cont statin  Hypotension- soft BPs, continue to monitor. ACE held  Dispo- likely home today  Signed, Perry Mount PA-C  Pager [phone]  Agree with note by [facility]  Pt admitted with NSTEMI, Rx with RCA PCI/Stent with staged PCI to LAD. Had AFIB s/p TEE DCCV. Now on lovenox bridge, coumadin A/C as well as DAPT. Exam benign. Monitor NSR with PVCs on low dose BB. No CP. OK for DC home. Needs close office f/u with [facility] for med reconciliation and checking INR. D/C lovenox once INR > 2. ROV with DR. Hilty.  Lorretta Harp, M.D., [place], [facility], [place] [address] [place], [place]  [id number]  [phone] 02/16/2013 9:21 AM

## 2013-02-16 NOTE — Progress Notes (Signed)
Pt had a 2.06 sec pause, pt denied CP, SOB, vital signs stable as charted, no distress.  Dr Elias Else informed, no new orders received.

## 2013-02-16 NOTE — Progress Notes (Signed)
Discharge instructions included reviewing care regarding urinary bleeding. Patient reported that urine was normal yellow color this morning, instructed to monitor and report any noted change in urine color to physician as well as other s/s bleeding. Patient verbalized understanding.

## 2013-02-16 NOTE — Progress Notes (Signed)
Coumadin and lovenox per pharmacy  Anticoagulation: S/p cath and PCI to RCA and also s/p staged PCI to LAD 2/9. Angiomax d/ced at 1900 02/07. Started on coumadin (coumadin score = 9). INR was 1.11 on 02/12/13. Patient is now on ASA, plavix, coumadin and heparin..Had TEE/DCCV yesterday. No hematuria. INR didn't move; it's 1.06. Now on lovenox (instead of heparin) as a bridge.  Goal of Therapy:  Anti-Xa level 0.6-1 units/ml 4hrs after LMWH dose given; INR 2-3 Monitor platelets by anticoagulation protocol: Yes   Hem/Onc: Hgb 13.5 and Plt 159 K on 02/11  Plan:  Continue lovenox 100mg  sq q12h Coumadin 12.5 mg po x1 today.  If discharge today, rec to continue coumadin 12.5mg  for few days and recheck INR on Friday to reassess dosing. Monitor signs of bleeding Coumadin educated on 02/15/13

## 2013-02-17 ENCOUNTER — Encounter (HOSPITAL_COMMUNITY): Payer: Self-pay | Admitting: Cardiovascular Disease

## 2013-02-18 ENCOUNTER — Encounter (INDEPENDENT_AMBULATORY_CARE_PROVIDER_SITE_OTHER): Payer: BC Managed Care – PPO | Admitting: Pharmacist Clinician (PhC)/ Clinical Pharmacy Specialist

## 2013-02-18 ENCOUNTER — Other Ambulatory Visit: Payer: Self-pay | Admitting: Pharmacist Clinician (PhC)/ Clinical Pharmacy Specialist

## 2013-02-18 ENCOUNTER — Ambulatory Visit (INDEPENDENT_AMBULATORY_CARE_PROVIDER_SITE_OTHER): Payer: BC Managed Care – PPO | Admitting: Pharmacist Clinician (PhC)/ Clinical Pharmacy Specialist

## 2013-02-18 VITALS — BP 126/70 | HR 56

## 2013-02-18 DIAGNOSIS — I4891 Unspecified atrial fibrillation: Secondary | ICD-10-CM

## 2013-02-18 DIAGNOSIS — Z7901 Long term (current) use of anticoagulants: Secondary | ICD-10-CM

## 2013-02-18 DIAGNOSIS — I480 Paroxysmal atrial fibrillation: Secondary | ICD-10-CM

## 2013-02-18 LAB — POCT INR: INR: 3.6

## 2013-02-18 MED ORDER — WARFARIN SODIUM 2.5 MG PO TABS: ORAL_TABLET | ORAL | Status: DC

## 2013-02-22 ENCOUNTER — Ambulatory Visit: Payer: BC Managed Care – PPO | Admitting: Pharmacist Clinician (PhC)/ Clinical Pharmacy Specialist

## 2013-02-23 ENCOUNTER — Ambulatory Visit (INDEPENDENT_AMBULATORY_CARE_PROVIDER_SITE_OTHER): Payer: BC Managed Care – PPO | Admitting: Pharmacist Clinician (PhC)/ Clinical Pharmacy Specialist

## 2013-02-23 DIAGNOSIS — I4891 Unspecified atrial fibrillation: Secondary | ICD-10-CM

## 2013-02-23 DIAGNOSIS — Z7901 Long term (current) use of anticoagulants: Secondary | ICD-10-CM

## 2013-02-23 LAB — POCT INR: INR: 3.7

## 2013-03-03 ENCOUNTER — Ambulatory Visit: Payer: BC Managed Care – PPO | Admitting: Pharmacist Clinician (PhC)/ Clinical Pharmacy Specialist

## 2013-03-03 ENCOUNTER — Ambulatory Visit: Payer: BC Managed Care – PPO | Admitting: Physician Assistant

## 2013-03-07 ENCOUNTER — Ambulatory Visit (INDEPENDENT_AMBULATORY_CARE_PROVIDER_SITE_OTHER): Payer: BC Managed Care – PPO | Admitting: Pharmacist Clinician (PhC)/ Clinical Pharmacy Specialist

## 2013-03-07 ENCOUNTER — Encounter: Payer: Self-pay | Admitting: Physician Assistant

## 2013-03-07 ENCOUNTER — Ambulatory Visit (INDEPENDENT_AMBULATORY_CARE_PROVIDER_SITE_OTHER): Payer: BC Managed Care – PPO | Admitting: Physician Assistant

## 2013-03-07 VITALS — BP 120/80 | HR 75 | Ht 67.0 in | Wt 224.0 lb

## 2013-03-07 DIAGNOSIS — Z7901 Long term (current) use of anticoagulants: Secondary | ICD-10-CM

## 2013-03-07 DIAGNOSIS — E669 Obesity, unspecified: Secondary | ICD-10-CM | POA: Insufficient documentation

## 2013-03-07 DIAGNOSIS — Z9861 Coronary angioplasty status: Secondary | ICD-10-CM

## 2013-03-07 DIAGNOSIS — I4891 Unspecified atrial fibrillation: Secondary | ICD-10-CM

## 2013-03-07 DIAGNOSIS — I2510 Atherosclerotic heart disease of native coronary artery without angina pectoris: Secondary | ICD-10-CM

## 2013-03-07 DIAGNOSIS — E785 Hyperlipidemia, unspecified: Secondary | ICD-10-CM

## 2013-03-07 DIAGNOSIS — I480 Paroxysmal atrial fibrillation: Secondary | ICD-10-CM

## 2013-03-07 LAB — POCT INR: INR: 1.9

## 2013-03-07 NOTE — Patient Instructions (Signed)
1.  Follow up with [name] in 1-2 weeks.

## 2013-03-07 NOTE — Assessment & Plan Note (Signed)
Reports occasional twinge of chest pain which does not sound cardiac.  It only lasts a few seconds.

## 2013-03-07 NOTE — Assessment & Plan Note (Signed)
Maintaining sinus rhythm on Toprol 25 mg daily. He is also on Coumadin

## 2013-03-07 NOTE — Assessment & Plan Note (Signed)
Treated with Lipitor 

## 2013-03-07 NOTE — Progress Notes (Signed)
Date:  03/07/2013   ID:  Paul Todd, DOB 1960-09-29, MRN [id number]  PCP:  [name], MD  Primary Cardiologist:  Hilty     History of Present Illness: Paul Todd is a 53 y.o. male male with a history of kidney stones, PAF, dyslipidemia and tobacco abuse who presented on 02/12/13 with a NSTEMI. He is followed by Dr Debara Pickett and had recently been seen in the office for chest pain. He underwent a Myoview on 02/02/13 which returned abnormal with an EF of 48%, but there was no ischemia. He was then scheduled for an elective cardiac cath for the next week. However, on 02/12/13 he woke up with SSCP "tightness" associated with diaphoresis, SOB, and radiation to his jaw and right arm and proceeded to the ED.   While in the ED the patient developed nausea and was noted to be bradycardic with HR in the 30s and SBP dropped to the 50s. ECG showed complete heart block but with a narrow escape. He was given atropine 0.5 mg with rise in HR to the 70s, NSR. Patient's second troponin returned abnormal at 0.17 and continued to climb >20. His episode of complete heart block was suspected to be vagally mediated from nausea; however, it was thought that RCA ischemia needed to be ruled out completely. It was decided that he proceed with a cardiac cath.   Cardiac cath on 02/12/13 revealed an acutely occluded RCA which was successfully treated with PTCA and diffuse stenting due to RCA stenoses beyond the point of total occlusion. The patient also had high-grade concomitant proximal LAD disease which required staged PCI. Left ventriculography revealed an ejection fraction of ~50%. There was evidence for mild mid and distal inferior hypocontractility as well. The patient developed atrial fibrillation during the procedure which was rate controlled with an IV beta blocker and then a low dose oral agent. He underwent staged PCI to LAD on 02/14/13 with successful PTCA/DES x 1 to proximal LAD and he was placed on ASA/Brilinta.     It was felt that the patient would not tolerate atrial fibrillation well and underwent successful TEE/DCCV on 02/15/13. He is on triple therapy with coumadin. The patient had some issues with hematuria (likely from foley cath related injury) prompting more serious discission about his anticoagulation status. Dr. Burt Knack reviewed his options considering his recent MI and multivessel stenting as well as indication for anticoagulation with atrial fib and felt it best that he continue ASA 81 mg, switch brilinta to plavix and continue heparin/warfarin overlap, which would be limited to 3-6 months depending on his clinical course. Of note patient also had some shortness of breath on Brilinta, which was resolved when he was switched to Plavix.  The patient presents today for two-week post hospital evaluation.  He reports doing well. Has occasional twinge of chest pain lasts a few seconds he had some dark urine over a week ago but has not had any since. He also reports some slight groin pain.  He is due to have his second portion of the sleep study completed this Thursday and at that time will be fitted with a CPAP.  Patient also reports occasional lightheadedness upon standing..  The patient currently denies nausea, vomiting, fever, shortness of breath, orthopnea, dizziness, PND, cough, congestion, abdominal pain, hematochezia, melena, lower extremity edema, claudication.  Wt Readings from Last 3 Encounters:  03/07/13 224 lb (101.606 kg)  02/16/13 231 lb 0.7 oz (104.8 kg)  02/16/13 231 lb 0.7 oz (  104.8 kg)     Past Medical History  Diagnosis Date  . Nephrolithiasis   . PAF (paroxysmal atrial fibrillation)     during a kidney stone attack  . Dyslipidemia   . CAD (coronary artery disease)     multivessel per cath 02/12/13, s/p stenting to RCA    Current Outpatient Prescriptions  Medication Sig Dispense Refill  . aspirin (ASPIRIN LOW DOSE) 81 MG EC tablet Take 1 tablet (81 mg total) by mouth daily.  Swallow whole.  30 tablet  12  . atorvastatin (LIPITOR) 80 MG tablet Take 1 tablet (80 mg total) by mouth daily.  90 tablet  3  . clopidogrel (PLAVIX) 75 MG tablet Take 1 tablet (75 mg total) by mouth daily with breakfast.  30 tablet  11  . enoxaparin (LOVENOX) 100 MG/ML injection Inject 1 mL (100 mg total) into the skin every 12 (twelve) hours.  6 Syringe  0  . metoprolol succinate (TOPROL-XL) 25 MG 24 hr tablet Take 1 tablet (25 mg total) by mouth 2 (two) times daily.  90 tablet  3  . nitroGLYCERIN (NITROSTAT) 0.4 MG SL tablet Place 1 tablet (0.4 mg total) under the tongue every 5 (five) minutes x 3 doses as needed for chest pain.  25 tablet  12  . pantoprazole (PROTONIX) 40 MG tablet Take 1 tablet (40 mg total) by mouth daily.  30 tablet  9  . warfarin (COUMADIN) 2.5 MG tablet Take 2-4 tablets by mouth daily as directed  45 tablet  0   No current facility-administered medications for this visit.    Allergies:   No Known Allergies  Social History:  The patient  reports that he has been smoking Cigarettes.  He has a 17.5 pack-year smoking history. He has never used smokeless tobacco. He reports that he drinks alcohol. He reports that he does not use illicit drugs.   Family history:   Family History  Problem Relation Age of Onset  . Heart disease Mother   . Diabetes Mother   . Diabetes Father     ROS:  Please see the history of present illness.  All other systems reviewed and negative.   PHYSICAL EXAM: VS:  BP 120/80  Pulse 75  Ht 5\' 7"  (1.702 m)  Wt 224 lb (101.606 kg)  BMI 35.08 kg/m2 Obese, well developed, in no acute distress HEENT: Pupils are equal round react to light accommodation extraocular movements are intact.  Neck: no JVDNo cervical lymphadenopathy. Cardiac: Regular rate and rhythm without murmurs rubs or gallops. Lungs:  clear to auscultation bilaterally, no wheezing, rhonchi or rales Abd: soft, nontender, positive bowel sounds all quadrants, Ext: no lower  extremity edema.  2+ radial and dorsalis pedis pulses. Skin: warm and dry.  No bruit in the right groin cath site. His mild periumbilical ecchymosis. Neuro:  Grossly normal  EKG:   Sinus rhythm at a rate of 75 beats per minute occasional PVCs  ASSESSMENT AND PLAN:  Problem List Items Addressed This Visit   Dyslipidemia (Chronic)     Treated with Lipitor    PAF (paroxysmal atrial fibrillation) - Primary     Maintaining sinus rhythm on Toprol 25 mg daily. He is also on Coumadin    CAD S/P percutaneous coronary angioplasty - DES to Mid LAD; 2x 24mm x 38 mm Xience DES to RCA     Reports occasional twinge of chest pain which does not sound cardiac.  It only lasts a few seconds.    Obesity (  BMI 35.0-39.9 without comorbidity)     Patient has done well has lost 20 pounds since last fall. I did offer her a referral for medical nutrition therapy. It is likely a [employer] will cover these visits.

## 2013-03-07 NOTE — Assessment & Plan Note (Signed)
Patient has done well has lost 20 pounds since last fall. I did offer her a referral for medical nutrition therapy. It is likely a [employer] will cover these visits.

## 2013-03-10 ENCOUNTER — Telehealth: Payer: Self-pay | Admitting: Internal Medicine

## 2013-03-10 ENCOUNTER — Encounter: Payer: Self-pay | Admitting: *Deleted

## 2013-03-10 NOTE — Telephone Encounter (Signed)
He need something stating that he is unable to work until Dr Debara Pickett releases him. He is trying to get some assitance while he is not working.

## 2013-03-10 NOTE — Telephone Encounter (Signed)
Returned a call to patient. He is needing a note written today by 1:00 pm stating that he has had a heart attack , and hasn't been released to return to work. This message will be discussed with [name].

## 2013-03-16 ENCOUNTER — Ambulatory Visit (INDEPENDENT_AMBULATORY_CARE_PROVIDER_SITE_OTHER): Payer: BC Managed Care – PPO | Admitting: Pharmacist Clinician (PhC)/ Clinical Pharmacy Specialist

## 2013-03-16 DIAGNOSIS — Z7901 Long term (current) use of anticoagulants: Secondary | ICD-10-CM

## 2013-03-16 DIAGNOSIS — I4891 Unspecified atrial fibrillation: Secondary | ICD-10-CM

## 2013-03-16 LAB — POCT INR: INR: 1.6

## 2013-03-16 MED ORDER — WARFARIN SODIUM 4 MG PO TABS: 4.0000 mg | ORAL_TABLET | Freq: Every day | ORAL | Status: DC

## 2013-03-28 ENCOUNTER — Encounter: Payer: Self-pay | Admitting: Internal Medicine

## 2013-03-28 ENCOUNTER — Ambulatory Visit (INDEPENDENT_AMBULATORY_CARE_PROVIDER_SITE_OTHER): Payer: BC Managed Care – PPO | Admitting: Internal Medicine

## 2013-03-28 ENCOUNTER — Ambulatory Visit (INDEPENDENT_AMBULATORY_CARE_PROVIDER_SITE_OTHER): Payer: BC Managed Care – PPO | Admitting: Pharmacist Clinician (PhC)/ Clinical Pharmacy Specialist

## 2013-03-28 VITALS — BP 106/70 | HR 50 | Ht 67.0 in | Wt 224.6 lb

## 2013-03-28 DIAGNOSIS — Z9861 Coronary angioplasty status: Secondary | ICD-10-CM

## 2013-03-28 DIAGNOSIS — R5381 Other malaise: Secondary | ICD-10-CM

## 2013-03-28 DIAGNOSIS — Z7901 Long term (current) use of anticoagulants: Secondary | ICD-10-CM

## 2013-03-28 DIAGNOSIS — I4891 Unspecified atrial fibrillation: Secondary | ICD-10-CM

## 2013-03-28 DIAGNOSIS — I480 Paroxysmal atrial fibrillation: Secondary | ICD-10-CM

## 2013-03-28 DIAGNOSIS — E785 Hyperlipidemia, unspecified: Secondary | ICD-10-CM

## 2013-03-28 DIAGNOSIS — I214 Non-ST elevation (NSTEMI) myocardial infarction: Secondary | ICD-10-CM

## 2013-03-28 DIAGNOSIS — E669 Obesity, unspecified: Secondary | ICD-10-CM

## 2013-03-28 DIAGNOSIS — I2510 Atherosclerotic heart disease of native coronary artery without angina pectoris: Secondary | ICD-10-CM

## 2013-03-28 DIAGNOSIS — Z87898 Personal history of other specified conditions: Secondary | ICD-10-CM

## 2013-03-28 DIAGNOSIS — Z9189 Other specified personal risk factors, not elsewhere classified: Secondary | ICD-10-CM

## 2013-03-28 DIAGNOSIS — R5383 Other fatigue: Secondary | ICD-10-CM

## 2013-03-28 LAB — POCT INR: INR: 1.6

## 2013-03-28 NOTE — Patient Instructions (Signed)
Your physician has recommended you make the following change in your medication: DECREASE Toprol XL to 25mg  ONCE DAILY  You have been referred to [place]  Your physician recommends that you schedule a follow-up appointment in: 6 months with [name].

## 2013-03-28 NOTE — Progress Notes (Signed)
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 He apparently travels significantly and has a pretty poor diet. He reports drinking about 6 alcoholic beverages per week and smokes about one half pack per day for the past 30 years. Recently, while up in [place], he was suffering from a kidney stone and was noted to have atrial fibrillation. He was unaware of this, and was given medication to slow his heart rate. Apparently he converted back into sinus and was instructed to be on aspirin.  He has recently been describing some chest pain which is substernal and radiates to the jaw and down into his right arm. The symptoms last for about 5-10 minutes and go away at rest, if he has been exerting himself. He also has a history of heart disease both in his mother and a father who had high blood pressure.  In addition he filled out an S4 sleepiness scale today which was scored as 12 suggesting a higher than likely possibility of sleep apnea.  He does report difficulty staying asleep, and feeling drowsy throughout the day with the need to take frequent naps.   Paul Todd returns today for followup of his nuclear stress test which was performed on 02/02/2013. This showed essentially no reversible ischemia, however the EF was reduced at 48%. It is unclear to me whether this is a gating abnormality or perhaps due to long-standing hypertension, undiagnosed sleep apnea, or multivessel coronary disease. He did have the episode of paroxysmal atrial fibrillation and continues to have chest pain which is very concerning for angina. He also had a recent lipid profile demonstrating an LDL of 111 and LDL particle number of 1774.  Triglycerides were elevated at 206.  I recommended starting Lipitor 40 mg daily.  Unfortunately, while undergoing work-up, he presented on  02/12/13 with a NSTEMI. He is followed by Dr Debara Pickett and had recently been seen in the office for chest pain. He underwent a Myoview on 02/02/13 which returned abnormal with an EF of 48%, but there was no ischemia. He was then scheduled for an elective cardiac cath for the next week. However, on 02/12/13 he woke up with SSCP "tightness" associated with diaphoresis, SOB, and radiation to his jaw and right arm and proceeded to the ED. While in the ED the patient developed nausea and was noted to be bradycardic with HR in the 30s and SBP dropped to the 50s. ECG showed complete heart block but with a narrow escape. He was given atropine 0.5 mg with rise in HR to the 70s, NSR. Patient's second troponin returned abnormal at 0.17 and continued to climb >20. His episode of complete heart block was suspected to be vagally mediated from nausea; however, it was thought that RCA ischemia needed to be ruled out completely. It was decided that he proceed with a cardiac cath.   Cardiac cath on 02/12/13 revealed an acutely occluded RCA which was successfully treated with PTCA and diffuse stenting due to RCA stenoses beyond the point of total occlusion. The patient also had high-grade concomitant proximal LAD disease which required staged PCI. Left ventriculography revealed an ejection fraction of ~50%. There was evidence for mild mid and distal inferior hypocontractility as well. The patient developed atrial fibrillation during the procedure which was rate controlled with an IV beta blocker and then a low dose oral agent.  He underwent staged PCI to LAD on 02/14/13 with successful PTCA/DES x 1 to proximal LAD and he was placed on ASA/Brilinta.  It was felt that the patient would not tolerate atrial fibrillation well and underwent successful TEE/DCCV on 02/15/13. He is on triple therapy with coumadin. The patient had some issues with hematuria (likely from foley cath related injury) prompting more serious discission about his anticoagulation  status. Dr. Burt Knack reviewed his options considering his recent MI and multivessel stenting as well as indication for anticoagulation with atrial fib and felt it best that he continue ASA 81 mg, switch brilinta to plavix and continue heparin/warfarin overlap, which would be limited to 3-6 months depending on his clinical course. Of note patient also had some shortness of breath on Brilinta, which was resolved when he was switched to Plavix.  Today he reports doing fairly well other than the fact that he unfortunately lost his job after this episode. He still looking for a job at this time but denies any chest pain or worsening shortness of breath. He was diagnosed with sleep apnea and is awaiting a call to get fitted with a CPAP machine. His only other complaint is feeling dizzy in the morning with some low blood pressures. Today she is borderline hypotensive with a heart rate in 50.  PMHx:  Past Medical History  Diagnosis Date  . Nephrolithiasis   . PAF (paroxysmal atrial fibrillation)     during a kidney stone attack  . Dyslipidemia   . CAD (coronary artery disease)     multivessel per cath 02/12/13, s/p stenting to RCA    Past Surgical History  Procedure Laterality Date  . Tee without cardioversion N/A 02/15/2013    Procedure: TRANSESOPHAGEAL ECHOCARDIOGRAM (TEE);  Surgeon: Thayer Headings, MD;  Location: [facility];  Service: Cardiovascular;  Laterality: N/A;  . Cardioversion N/A 02/15/2013    Procedure: CARDIOVERSION;  Surgeon: Thayer Headings, MD;  Location: [facility];  Service: Cardiovascular;  Laterality: N/A;    FAMHx:  Family History  Problem Relation Age of Onset  . Heart disease Mother   . Diabetes Mother   . Diabetes Father     SOCHx:   reports that he quit smoking about 6 weeks ago. His smoking use included Cigarettes. He has a 17.5 pack-year smoking history. He has never used smokeless tobacco. He reports that he does not drink alcohol or use illicit  drugs.  ALLERGIES:  No Known Allergies  ROS: A comprehensive review of systems was negative except for: Constitutional: positive for fatigue and difficulty sleeping, snoring Cardiovascular: positive for exertional chest pressure/discomfort  HOME MEDS: Current Outpatient Prescriptions  Medication Sig Dispense Refill  . aspirin (ASPIRIN LOW DOSE) 81 MG EC tablet Take 1 tablet (81 mg total) by mouth daily. Swallow whole.  30 tablet  12  . atorvastatin (LIPITOR) 80 MG tablet Take 1 tablet (80 mg total) by mouth daily.  90 tablet  3  . clopidogrel (PLAVIX) 75 MG tablet Take 1 tablet (75 mg total) by mouth daily with breakfast.  30 tablet  11  . metoprolol succinate (TOPROL-XL) 25 MG 24 hr tablet Take 25 mg by mouth daily.      . nitroGLYCERIN (NITROSTAT) 0.4 MG SL tablet Place 1 tablet (0.4 mg total) under the tongue every 5 (five) minutes x 3 doses as needed for chest pain.  25 tablet  12  . pantoprazole (PROTONIX) 40 MG tablet Take 1 tablet (40 mg total) by mouth daily.  30 tablet  9  . warfarin (COUMADIN) 4 MG tablet Take 1 tablet (4 mg total) by mouth daily.  35 tablet  3   No current facility-administered medications for this visit.    LABS/IMAGING: No results found for this or any previous visit (from the past 48 hour(s)). No results found.  VITALS: BP 106/70  Pulse 50  Ht 5\' 7"  (1.702 m)  Wt 224 lb 9.6 oz (101.878 kg)  BMI 35.17 kg/m2  EXAM: deferred  EKG: deferred  ASSESSMENT: 1. Continued chest pain concerning for unstable angina - low risk NST but EF 48% 2. Tobacco abuse 3. Family history of coronary disease 4. Paroxysmal atrial fibrillation 5. Fatigue/snoring, concerning for possible obstructive sleep apnea 6. Low back pain, difficulty walking  PLAN: 1.   Paul Todd did indeed have coronary disease unfortunately presented with acute occlusion of the right coronary. He had drug-eluting stent to that as well as to the mid LAD and is doing well. He is currently  on triple therapy due to his atrial fibrillation, which is low-dose aspirin, Plavix and warfarin. His INR was slightly low today and will be adjusted. He initially had a little bit of hematuria but that has gone away. He is maintaining sinus rhythm on his current medications. His blood pressure is a little low and heart rate is low therefore I'll decrease his Toprol-XL to 25 mg once daily, which should help with some of his dizziness in the morning which he notes. I suspect this is due to hypotension and bradycardia. Otherwise is doing well, and I would like to refer him for cardiac rehabilitation if his insurance will allow that. He should be fitted for a CPAP machine in the near future as he's gone through titration. Plan to see him back in 6 months or sooner as necessary.  Pixie Casino, MD, [facility] Attending Cardiologist [facility]  Io Dieujuste C 03/28/2013, 10:22 AM

## 2013-04-01 ENCOUNTER — Telehealth: Payer: Self-pay | Admitting: *Deleted

## 2013-04-01 NOTE — Telephone Encounter (Signed)
Faxed cardiac rehab orders  (phase 2)

## 2013-04-08 ENCOUNTER — Ambulatory Visit: Payer: BC Managed Care – PPO | Admitting: Pharmacist Clinician (PhC)/ Clinical Pharmacy Specialist

## 2013-04-11 ENCOUNTER — Ambulatory Visit (INDEPENDENT_AMBULATORY_CARE_PROVIDER_SITE_OTHER): Payer: Medicaid Other | Admitting: Pharmacist Clinician (PhC)/ Clinical Pharmacy Specialist

## 2013-04-11 DIAGNOSIS — Z7901 Long term (current) use of anticoagulants: Secondary | ICD-10-CM

## 2013-04-11 DIAGNOSIS — I4891 Unspecified atrial fibrillation: Secondary | ICD-10-CM

## 2013-04-11 LAB — POCT INR: INR: 2

## 2013-04-13 ENCOUNTER — Telehealth: Payer: Self-pay | Admitting: *Deleted

## 2013-04-13 NOTE — Telephone Encounter (Signed)
Returned CPAP supply order. 

## 2013-04-14 ENCOUNTER — Encounter (HOSPITAL_COMMUNITY)
Admission: RE | Admit: 2013-04-14 | Discharge: 2013-04-14 | Disposition: A | Discharge status: Home / Self Care | Payer: Medicaid Other | Source: Ambulatory Visit | Attending: Internal Medicine | Admitting: Internal Medicine

## 2013-04-14 DIAGNOSIS — Z5189 Encounter for other specified aftercare: Secondary | ICD-10-CM | POA: Insufficient documentation

## 2013-04-14 DIAGNOSIS — Z9861 Coronary angioplasty status: Secondary | ICD-10-CM | POA: Insufficient documentation

## 2013-04-14 DIAGNOSIS — F17200 Nicotine dependence, unspecified, uncomplicated: Secondary | ICD-10-CM | POA: Insufficient documentation

## 2013-04-14 DIAGNOSIS — I214 Non-ST elevation (NSTEMI) myocardial infarction: Secondary | ICD-10-CM | POA: Insufficient documentation

## 2013-04-14 DIAGNOSIS — I4891 Unspecified atrial fibrillation: Secondary | ICD-10-CM | POA: Insufficient documentation

## 2013-04-14 NOTE — Progress Notes (Signed)
Cardiac Rehab Medication Review by a Pharmacist  Does the patient  feel that his/her medications are working for him/her?  yes  Has the patient been experiencing any side effects to the medications prescribed?  Yes, light-headedness now resolved, occasional blood in urine  Does the patient measure his/her own blood pressure or blood glucose at home?  No, concerns with device accuracy  Does the patient have any problems obtaining medications due to transportation or finances?   no  Understanding of regimen: fair Understanding of indications: fair Potential of compliance: good    Pharmacist comments: Paul Todd is a 53 yo M presenting for cardiac rehab.  He reports some recent issues with a stiff neck that has been addressed with newly started Tylenol Arthritis, Hydrocodone/APAP, and Valium.  I'm concerned about the amount of acetaminophen he may be receiving from the OTC Tylenol and the Hydrocodone/APAP together, taking them routinely around-the-clock.  Per outpatient pharmacy, the Valium and Hydrocodone/APAP were prescribed by Genevieve Norlander (office [phone]). I discussed this with the patient, and urged him to clarify the amount of these meds he should be taking with his physician or if he should be taking either/or.  I warned him of the possible liver toxicity that can occur from too much per day (limit to 4000mg ).  I also informed the nurse here at cardiac rehab, as well has called and informed Paul Todd's office of situation (spoke to nurse who will communicate to [name]).  He also reports some recent light-headedness that was alleviated when the physician reduced his Toprol-XL by half to 25mg  daily.  [name] reports occasional blood in urine.  With him taking Coumadin, this concerned me.  I verified with him that he makes the physician/pharmacist office that doses his Coumadin aware of this, and he does.  He says it just comes and goes, and they instruct him to let them know if it  persists or gets worse.  He has a device to measure BP at home, but he does not think its accurate. I encouraged him to take it with him on his next visit to compare it to the office measurement.  The patient uses a pill box to help with compliance.  [name] [name], PharmD Clinical Pharmacist - Resident Pager: [phone] Pharmacy: [phone] 04/14/2013 9:07 AM

## 2013-04-18 ENCOUNTER — Encounter (HOSPITAL_COMMUNITY)
Admission: RE | Admit: 2013-04-18 | Discharge: 2013-04-18 | Disposition: A | Discharge status: Home / Self Care | Payer: Medicaid Other | Source: Ambulatory Visit | Attending: Internal Medicine | Admitting: Internal Medicine

## 2013-04-18 ENCOUNTER — Encounter (HOSPITAL_COMMUNITY): Admission: RE | Admit: 2013-04-18 | Payer: Medicaid Other | Source: Ambulatory Visit

## 2013-04-18 NOTE — Progress Notes (Signed)
Pt arrived late to cardiac rehab for his first day of exercise at the 6:45 exercise class time due to trouble finding the parking garage.  Pt bp checked 144/82.  Pt remarked that he felt very tired all day yesterday and didn't get out of the bed.  Pt complained of his head feeling full toward the crown area.  Pt denies any issues with allergies or sinus.  Pt also complained of some dizziness when he would lay down but this would resolve when he would sit up in the chair.  Pt recently prescribed valium and hydrocodone together for neck discomfort.  His neck discomfort resolved with the pain medication.  Pt took the combination of medication for the past three days.  Pt bp rechecked 134/84. Pt not able to exercise today.  Will call Dr. Pablo Lawrence at [facility] and request follow up appt.  Called and left message with answering service at 8:05.  Called back and made appt for pt to be seen on tomorrow at 9 am by Dr. Pablo Lawrence.  Pt is in agreement and verbalized understanding. Pt stated that he felt better and capable of driving himself home.  Rehab volunteer showed pt where to park and pt will return to exercise on Wednesday for his first day in rehab s/p 02/12/13 nstemi and stent to RCA and LAD.

## 2013-04-20 ENCOUNTER — Encounter (HOSPITAL_COMMUNITY)
Admission: RE | Admit: 2013-04-20 | Discharge: 2013-04-20 | Disposition: A | Discharge status: Home / Self Care | Payer: Medicaid Other | Source: Ambulatory Visit | Attending: Internal Medicine | Admitting: Internal Medicine

## 2013-04-20 DIAGNOSIS — I4891 Unspecified atrial fibrillation: Secondary | ICD-10-CM | POA: Diagnosis not present

## 2013-04-20 DIAGNOSIS — Z5189 Encounter for other specified aftercare: Secondary | ICD-10-CM | POA: Diagnosis not present

## 2013-04-20 DIAGNOSIS — I214 Non-ST elevation (NSTEMI) myocardial infarction: Secondary | ICD-10-CM | POA: Diagnosis present

## 2013-04-20 DIAGNOSIS — F17200 Nicotine dependence, unspecified, uncomplicated: Secondary | ICD-10-CM | POA: Diagnosis not present

## 2013-04-20 DIAGNOSIS — Z9861 Coronary angioplasty status: Secondary | ICD-10-CM | POA: Diagnosis not present

## 2013-04-20 NOTE — Progress Notes (Signed)
Pt returned today for cardiac rehab exercise session for his first day.  Pt tolerated light exercise with some difficulty due to his lower back pain which is chronic for him.  Talked to pt about modifying equipment for tolerance.  Pt monitor showed SR with no noted ectopy. PHQ2 score 5.  Pt relates many financial issues since he lost his job after his cardiac event.  Pt's wife works part time and they are having difficulty getting ends to meet since his hospitalization.  Pt given Vocational Rehab form to complete.  Pt encouraged to complete the form as soon as possible so that this information can be sent to [facility] for review.  Pt is hopeful that he will be able to find a job in his field.  Pt short tem goal is to improve his sob and to strengthen his back.  Pt would benefit from exercise that would help with a ruptured disc.  Pt has a long term goal for weight loss.  Will continue to monitor his progress toward these goals.   Cherre Huger, BSN

## 2013-04-22 ENCOUNTER — Encounter (HOSPITAL_COMMUNITY)
Admission: RE | Admit: 2013-04-22 | Discharge: 2013-04-22 | Disposition: A | Discharge status: Home / Self Care | Payer: Medicaid Other | Source: Ambulatory Visit | Attending: Internal Medicine | Admitting: Internal Medicine

## 2013-04-22 DIAGNOSIS — Z5189 Encounter for other specified aftercare: Secondary | ICD-10-CM | POA: Diagnosis not present

## 2013-04-25 ENCOUNTER — Encounter (HOSPITAL_COMMUNITY)
Admission: RE | Admit: 2013-04-25 | Discharge: 2013-04-25 | Disposition: A | Discharge status: Home / Self Care | Payer: Medicaid Other | Source: Ambulatory Visit | Attending: Internal Medicine | Admitting: Internal Medicine

## 2013-04-25 DIAGNOSIS — Z5189 Encounter for other specified aftercare: Secondary | ICD-10-CM | POA: Diagnosis not present

## 2013-04-26 ENCOUNTER — Ambulatory Visit (INDEPENDENT_AMBULATORY_CARE_PROVIDER_SITE_OTHER): Payer: Medicaid Other | Admitting: Pharmacist Clinician (PhC)/ Clinical Pharmacy Specialist

## 2013-04-26 DIAGNOSIS — Z7901 Long term (current) use of anticoagulants: Secondary | ICD-10-CM

## 2013-04-26 DIAGNOSIS — I4891 Unspecified atrial fibrillation: Secondary | ICD-10-CM

## 2013-04-26 LAB — POCT INR: INR: 1.7

## 2013-04-27 ENCOUNTER — Encounter (HOSPITAL_COMMUNITY)
Admission: RE | Admit: 2013-04-27 | Discharge: 2013-04-27 | Disposition: A | Discharge status: Home / Self Care | Payer: Medicaid Other | Source: Ambulatory Visit | Attending: Internal Medicine | Admitting: Internal Medicine

## 2013-04-27 DIAGNOSIS — Z5189 Encounter for other specified aftercare: Secondary | ICD-10-CM | POA: Diagnosis not present

## 2013-04-27 NOTE — Progress Notes (Signed)
I have reviewed home exercise with Dominica Severin. The patient was advised to walk 2-4 days per week outside of CRP II for 15 minutes, 2 times per day until he can walk 30 minutes continuously.  Pt c/o calf pain and back pain with walking, but stated he has no other mode of exercise available for home exercise.  Pt was advised to walk as tolerated.  Pt will also complete one additional day of hand weights outside of CRP II.  Progression of exercise prescription was discussed.  Reviewed THR, pulse, RPE, sign and symptoms, NTG use and when to call 911 or MD.  Pt voiced understanding.  [address] Endo, MS, ACSM RCEP 04/27/2013 11:10 AM

## 2013-04-29 ENCOUNTER — Encounter (HOSPITAL_COMMUNITY)
Admission: RE | Admit: 2013-04-29 | Discharge: 2013-04-29 | Disposition: A | Discharge status: Home / Self Care | Payer: Medicaid Other | Source: Ambulatory Visit | Attending: Internal Medicine | Admitting: Internal Medicine

## 2013-04-29 DIAGNOSIS — Z5189 Encounter for other specified aftercare: Secondary | ICD-10-CM | POA: Diagnosis not present

## 2013-05-02 ENCOUNTER — Telehealth: Payer: Self-pay | Admitting: Internal Medicine

## 2013-05-02 ENCOUNTER — Encounter (HOSPITAL_COMMUNITY)
Admission: RE | Admit: 2013-05-02 | Discharge: 2013-05-02 | Disposition: A | Discharge status: Home / Self Care | Payer: Medicaid Other | Source: Ambulatory Visit | Attending: Internal Medicine | Admitting: Internal Medicine

## 2013-05-02 DIAGNOSIS — Z5189 Encounter for other specified aftercare: Secondary | ICD-10-CM | POA: Diagnosis not present

## 2013-05-02 NOTE — Progress Notes (Signed)
Reviewed Quality of Life Survey results with pt.  Pt scored very low in several areas which included health and function, socioeconomic and psychological.  This impacted his overall score.  Pt lost his job as a result of his cardiac event.  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  Pt felt he would not be able to continue with the demand and pace due to his cardiac event.  Pt is presently receiving unemployment benefits.  Pt completed vocational rehab form.  This was faxed to  [phone] on 4/20.  Pt has not heard back from them.  Will plan to make follow up phone call to verify information was received.  Pt with multiple stressors that include finances and family issues.  Pt allowed time to vent and talk to rehab staff.  Attempted to help pt organize based upon priority but in his present mind state he feels very overwhelmed. Asked if he would like assistance from spiritual care.  Pt politely declined.  Continue to provide emotional support and encouragement.  Maurice Small RN BSN

## 2013-05-02 NOTE — Telephone Encounter (Signed)
He needs a note stating that when he was seen on 03-07-13 and that he was told he could go back to work and do light duty.Please call,he will give you more details.

## 2013-05-02 NOTE — Telephone Encounter (Signed)
Pt seen by Tarri Fuller, PA-C on 3.2.15 and not mention of work in [place] note.  Pt's visit w/ [name] on 3.11.15, mentioned pt was looking for a job.   Message forwarded to Dr. Josepha Pigg, RN.

## 2013-05-03 NOTE — Telephone Encounter (Signed)
Returning your call. °

## 2013-05-03 NOTE — Telephone Encounter (Signed)
LMTCB

## 2013-05-03 NOTE — Telephone Encounter (Signed)
Spoke with patient regarding paperwork r/t work. Patient states he saw Gaspar Bidding, [place] on 3/2 and was told he could go back to work, but should f/up with [name]. Saw [name] 3/23 and was noted in office visit note that patient was looking for a job, because in the meantime his place of employment let him go. Patient did not bring any sort of paperwork to that office visit to be completed r/t his employment/lack thereof. On 3/5 this RN provided patient with urgent letter stating his medical condition in order for him to take to some financial assistance place for him being out of work, and letter was provided on same day patient called. Patient called in 4/27 stating he needed some paperwork, and this RN & [name] were both out of office. When patient was called back he stated he needed paperwork filled out TODAY regarding his hospitalization and condition for his unemployment. RN informed patient that [name] will not be in office until 4/30 so the paperwork cannot be completed/signed until that date but he can bring the paperwork today so it can be worked on. Patient was quite upset that the paperwork requested to be filled out TODAY cannot be completed on his time schedule. RN reiterated that it can be worked on if brought in early and informed patient that a charge may be associated with this paperwork being completed. Patient voiced understanding and will bring paperwork

## 2013-05-04 ENCOUNTER — Encounter (HOSPITAL_COMMUNITY)
Admission: RE | Admit: 2013-05-04 | Discharge: 2013-05-04 | Disposition: A | Discharge status: Home / Self Care | Payer: Medicaid Other | Source: Ambulatory Visit | Attending: Internal Medicine | Admitting: Internal Medicine

## 2013-05-04 ENCOUNTER — Encounter: Payer: Self-pay | Admitting: *Deleted

## 2013-05-04 DIAGNOSIS — Z5189 Encounter for other specified aftercare: Secondary | ICD-10-CM | POA: Diagnosis not present

## 2013-05-05 NOTE — Telephone Encounter (Signed)
Paperwork completed.   LM to inform patient that he could pick up paperwork at front desk.

## 2013-05-06 ENCOUNTER — Encounter (HOSPITAL_COMMUNITY)
Admission: RE | Admit: 2013-05-06 | Discharge: 2013-05-06 | Disposition: A | Discharge status: Home / Self Care | Payer: Medicaid Other | Source: Ambulatory Visit | Attending: Internal Medicine | Admitting: Internal Medicine

## 2013-05-06 DIAGNOSIS — Z9861 Coronary angioplasty status: Secondary | ICD-10-CM | POA: Insufficient documentation

## 2013-05-06 DIAGNOSIS — I214 Non-ST elevation (NSTEMI) myocardial infarction: Secondary | ICD-10-CM | POA: Insufficient documentation

## 2013-05-09 ENCOUNTER — Encounter (HOSPITAL_COMMUNITY): Payer: Medicaid Other

## 2013-05-09 ENCOUNTER — Ambulatory Visit (INDEPENDENT_AMBULATORY_CARE_PROVIDER_SITE_OTHER): Payer: Medicaid Other | Admitting: Pharmacist Clinician (PhC)/ Clinical Pharmacy Specialist

## 2013-05-09 DIAGNOSIS — I4891 Unspecified atrial fibrillation: Secondary | ICD-10-CM

## 2013-05-09 DIAGNOSIS — Z7901 Long term (current) use of anticoagulants: Secondary | ICD-10-CM

## 2013-05-09 LAB — POCT INR: INR: 2.5

## 2013-05-11 ENCOUNTER — Encounter (HOSPITAL_COMMUNITY)
Admission: RE | Admit: 2013-05-11 | Discharge: 2013-05-11 | Disposition: A | Discharge status: Home / Self Care | Payer: Medicaid Other | Source: Ambulatory Visit | Attending: Internal Medicine | Admitting: Internal Medicine

## 2013-05-11 DIAGNOSIS — I214 Non-ST elevation (NSTEMI) myocardial infarction: Secondary | ICD-10-CM | POA: Diagnosis not present

## 2013-05-13 ENCOUNTER — Encounter (HOSPITAL_COMMUNITY)
Admission: RE | Admit: 2013-05-13 | Discharge: 2013-05-13 | Disposition: A | Discharge status: Home / Self Care | Payer: Medicaid Other | Source: Ambulatory Visit | Attending: Internal Medicine | Admitting: Internal Medicine

## 2013-05-13 DIAGNOSIS — I214 Non-ST elevation (NSTEMI) myocardial infarction: Secondary | ICD-10-CM | POA: Diagnosis not present

## 2013-05-16 ENCOUNTER — Encounter (HOSPITAL_COMMUNITY)
Admission: RE | Admit: 2013-05-16 | Discharge: 2013-05-16 | Disposition: A | Discharge status: Home / Self Care | Payer: Medicaid Other | Source: Ambulatory Visit | Attending: Internal Medicine | Admitting: Internal Medicine

## 2013-05-16 DIAGNOSIS — I214 Non-ST elevation (NSTEMI) myocardial infarction: Secondary | ICD-10-CM | POA: Diagnosis not present

## 2013-05-16 NOTE — Progress Notes (Signed)
Paul Todd 53 y.o. male Nutrition Note Spoke with pt.  Nutrition Survey reviewed with pt. Pt is following Step 2 of the Therapeutic Lifestyle Changes diet. Pt wants to lose wt. Pt has been trying to lose wt. Pt reports his highest recent wt was 240 lb. Pt wt today 223.3 lb (101.5 kg). Pt with 16.7 lb wt loss. Wt loss tips reviewed. Pt is pre-diabetic according to his last A1c. Pt reports a family h/o DM. Pre-diabetes discussed. Pt encouraged to discuss pre-diabetes and future A1c checks with his PCP. Pt is on Coumadin and is aware of the need to follow a diet consistent in vitamin K intake. Pt expressed understanding of the information reviewed. Pt aware of nutrition education classes offered and is unable to attend nutrition classes .  Nutrition Diagnosis   Food-and nutrition-related knowledge deficit related to lack of exposure to information as related to diagnosis of: ? CVD ? Pre-DM (A1c 5.8) ?   Obesity related to excessive energy intake as evidenced by a BMI of 36.7  Nutrition Intervention   Benefits of adopting Therapeutic Lifestyle Changes discussed when Medficts reviewed.   Pt to attend the Portion Distortion class   Pt given handouts for: ? Nutrition I class ? Nutrition II class   Continue client-centered nutrition education by RD, as part of interdisciplinary care.  Goal(s)   Pt to identify food quantities necessary to achieve: ? wt loss to a goal wt of 204-222 lb (92.9-101.1 kg) at graduation from cardiac rehab.    Pt to describe the benefit of including fruits, vegetables, whole grains, and low-fat dairy products in a heart healthy meal plan.  Monitor and Evaluate progress toward nutrition goal with team. Nutrition Risk:  Low   Derek Mound, M.Ed, RD, LDN, CDE 05/16/2013 9:00 AM

## 2013-05-18 ENCOUNTER — Encounter (HOSPITAL_COMMUNITY)
Admission: RE | Admit: 2013-05-18 | Discharge: 2013-05-18 | Disposition: A | Discharge status: Home / Self Care | Payer: Medicaid Other | Source: Ambulatory Visit | Attending: Internal Medicine | Admitting: Internal Medicine

## 2013-05-18 DIAGNOSIS — I214 Non-ST elevation (NSTEMI) myocardial infarction: Secondary | ICD-10-CM | POA: Diagnosis not present

## 2013-05-20 ENCOUNTER — Encounter (HOSPITAL_COMMUNITY)
Admission: RE | Admit: 2013-05-20 | Discharge: 2013-05-20 | Disposition: A | Discharge status: Home / Self Care | Payer: Medicaid Other | Source: Ambulatory Visit | Attending: Internal Medicine | Admitting: Internal Medicine

## 2013-05-20 DIAGNOSIS — I214 Non-ST elevation (NSTEMI) myocardial infarction: Secondary | ICD-10-CM | POA: Diagnosis not present

## 2013-05-20 NOTE — Progress Notes (Signed)
Pt in today for cardiac rehab. Pt commented to rehab staff that he had an episode of chest tightness on yesterday that lasted 10-15 minutes.  Pt was sitting at the computer reading through emails.  Pt admits to a lot of stress dealing with unemployment and finances but the e-mail he was reading was not stressful.  Pt thought it was indigestion although it was similar in nature of the events leading up to his MI but not as intense.   Pt did not take any NTG but did chew a couple of Tums with relief.  Pt did not have any subsequent discomfort and continued to do his activities of the day.  Pt exercised today with no further incident.  Pre exercise bp slightly elevated 158/90 which did decrease during exercise with an exit BP of 140/82.  Will send FYI to [name] for review.  Cherre Huger, BSN

## 2013-05-23 ENCOUNTER — Encounter (HOSPITAL_COMMUNITY)
Admission: RE | Admit: 2013-05-23 | Discharge: 2013-05-23 | Disposition: A | Discharge status: Home / Self Care | Payer: Medicaid Other | Source: Ambulatory Visit | Attending: Internal Medicine | Admitting: Internal Medicine

## 2013-05-23 DIAGNOSIS — I214 Non-ST elevation (NSTEMI) myocardial infarction: Secondary | ICD-10-CM | POA: Diagnosis not present

## 2013-05-25 ENCOUNTER — Encounter (HOSPITAL_COMMUNITY): Payer: Medicaid Other

## 2013-05-27 ENCOUNTER — Encounter (HOSPITAL_COMMUNITY)
Admission: RE | Admit: 2013-05-27 | Discharge: 2013-05-27 | Disposition: A | Discharge status: Home / Self Care | Payer: Medicaid Other | Source: Ambulatory Visit | Attending: Internal Medicine | Admitting: Internal Medicine

## 2013-05-27 DIAGNOSIS — I214 Non-ST elevation (NSTEMI) myocardial infarction: Secondary | ICD-10-CM | POA: Diagnosis not present

## 2013-05-30 ENCOUNTER — Encounter (HOSPITAL_COMMUNITY): Payer: Medicaid Other

## 2013-06-01 ENCOUNTER — Encounter (HOSPITAL_COMMUNITY): Payer: Medicaid Other

## 2013-06-01 ENCOUNTER — Ambulatory Visit (INDEPENDENT_AMBULATORY_CARE_PROVIDER_SITE_OTHER): Payer: Medicaid Other | Admitting: Pharmacist Clinician (PhC)/ Clinical Pharmacy Specialist

## 2013-06-01 DIAGNOSIS — I480 Paroxysmal atrial fibrillation: Secondary | ICD-10-CM

## 2013-06-01 DIAGNOSIS — Z7901 Long term (current) use of anticoagulants: Secondary | ICD-10-CM

## 2013-06-01 DIAGNOSIS — I4891 Unspecified atrial fibrillation: Secondary | ICD-10-CM

## 2013-06-01 LAB — POCT INR: INR: 2.3

## 2013-06-03 ENCOUNTER — Encounter (HOSPITAL_COMMUNITY)
Admission: RE | Admit: 2013-06-03 | Discharge: 2013-06-03 | Disposition: A | Discharge status: Home / Self Care | Payer: Medicaid Other | Source: Ambulatory Visit | Attending: Internal Medicine | Admitting: Internal Medicine

## 2013-06-03 DIAGNOSIS — I214 Non-ST elevation (NSTEMI) myocardial infarction: Secondary | ICD-10-CM | POA: Diagnosis not present

## 2013-06-06 ENCOUNTER — Encounter (HOSPITAL_COMMUNITY)
Admission: RE | Admit: 2013-06-06 | Discharge: 2013-06-06 | Disposition: A | Discharge status: Home / Self Care | Payer: Medicaid Other | Source: Ambulatory Visit | Attending: Internal Medicine | Admitting: Internal Medicine

## 2013-06-06 DIAGNOSIS — I214 Non-ST elevation (NSTEMI) myocardial infarction: Secondary | ICD-10-CM | POA: Diagnosis not present

## 2013-06-06 DIAGNOSIS — Z9861 Coronary angioplasty status: Secondary | ICD-10-CM | POA: Diagnosis present

## 2013-06-08 ENCOUNTER — Encounter (HOSPITAL_COMMUNITY)
Admission: RE | Admit: 2013-06-08 | Discharge: 2013-06-08 | Disposition: A | Discharge status: Home / Self Care | Payer: Medicaid Other | Source: Ambulatory Visit | Attending: Internal Medicine | Admitting: Internal Medicine

## 2013-06-08 ENCOUNTER — Telehealth: Payer: Self-pay | Admitting: Internal Medicine

## 2013-06-08 DIAGNOSIS — I214 Non-ST elevation (NSTEMI) myocardial infarction: Secondary | ICD-10-CM | POA: Diagnosis not present

## 2013-06-08 MED ORDER — METOPROLOL SUCCINATE ER 25 MG PO TB24: 25.0000 mg | ORAL_TABLET | Freq: Every day | ORAL | Status: DC

## 2013-06-08 NOTE — Telephone Encounter (Signed)
Need new prescription for Metoprolol 25 mg #90, need a prior authorization for this.Please call to [phone]

## 2013-06-08 NOTE — Telephone Encounter (Signed)
Rx was sent to pharmacy electronically. 

## 2013-06-10 ENCOUNTER — Encounter (HOSPITAL_COMMUNITY): Payer: Medicaid Other

## 2013-06-13 ENCOUNTER — Encounter (HOSPITAL_COMMUNITY)
Admission: RE | Admit: 2013-06-13 | Discharge: 2013-06-13 | Disposition: A | Discharge status: Home / Self Care | Payer: Medicaid Other | Source: Ambulatory Visit | Attending: Internal Medicine | Admitting: Internal Medicine

## 2013-06-13 ENCOUNTER — Other Ambulatory Visit: Payer: Self-pay | Admitting: *Deleted

## 2013-06-13 DIAGNOSIS — I214 Non-ST elevation (NSTEMI) myocardial infarction: Secondary | ICD-10-CM | POA: Diagnosis not present

## 2013-06-13 MED ORDER — METOPROLOL SUCCINATE ER 25 MG PO TB24: 25.0000 mg | ORAL_TABLET | Freq: Every day | ORAL | Status: DC

## 2013-06-13 MED ORDER — METOPROLOL SUCCINATE ER 50 MG PO TB24: ORAL_TABLET | ORAL | Status: DC

## 2013-06-13 NOTE — Telephone Encounter (Signed)
RN called pharmacy to verify that QD dose went thru and did not need prior authorization. Patient notified that med was ready for pick-up

## 2013-06-13 NOTE — Telephone Encounter (Signed)
Rx was sent to pharmacy electronically. 

## 2013-06-15 ENCOUNTER — Encounter (HOSPITAL_COMMUNITY): Payer: Medicaid Other

## 2013-06-17 ENCOUNTER — Encounter (HOSPITAL_COMMUNITY)
Admission: RE | Admit: 2013-06-17 | Discharge: 2013-06-17 | Disposition: A | Discharge status: Home / Self Care | Payer: Medicaid Other | Source: Ambulatory Visit | Attending: Internal Medicine | Admitting: Internal Medicine

## 2013-06-17 DIAGNOSIS — I214 Non-ST elevation (NSTEMI) myocardial infarction: Secondary | ICD-10-CM | POA: Diagnosis not present

## 2013-06-20 ENCOUNTER — Encounter (HOSPITAL_COMMUNITY): Payer: Medicaid Other

## 2013-06-22 ENCOUNTER — Encounter (HOSPITAL_COMMUNITY): Payer: Medicaid Other

## 2013-06-23 ENCOUNTER — Telehealth (HOSPITAL_COMMUNITY): Payer: Self-pay | Admitting: *Deleted

## 2013-06-23 NOTE — Telephone Encounter (Signed)
Pt absent for 2 exercise sessions.  Pt picked up odd jobs. Plans to return to exercise tomorrow.  Will be absent the week of 20th helping his father in law.

## 2013-06-24 ENCOUNTER — Encounter (HOSPITAL_COMMUNITY)
Admission: RE | Admit: 2013-06-24 | Discharge: 2013-06-24 | Disposition: A | Discharge status: Home / Self Care | Payer: Medicaid Other | Source: Ambulatory Visit | Attending: Internal Medicine | Admitting: Internal Medicine

## 2013-06-24 DIAGNOSIS — I214 Non-ST elevation (NSTEMI) myocardial infarction: Secondary | ICD-10-CM | POA: Diagnosis not present

## 2013-06-27 ENCOUNTER — Encounter (HOSPITAL_COMMUNITY): Payer: Medicaid Other

## 2013-06-29 ENCOUNTER — Ambulatory Visit: Payer: BC Managed Care – PPO | Admitting: Pharmacist Clinician (PhC)/ Clinical Pharmacy Specialist

## 2013-06-29 ENCOUNTER — Encounter (HOSPITAL_COMMUNITY): Payer: Medicaid Other

## 2013-07-01 ENCOUNTER — Encounter (HOSPITAL_COMMUNITY): Payer: Medicaid Other

## 2013-07-01 ENCOUNTER — Telehealth (HOSPITAL_COMMUNITY): Payer: Self-pay | Admitting: *Deleted

## 2013-07-01 NOTE — Telephone Encounter (Signed)
Pt called to let him know that his absolute last day of exercise will be on Wednesday July 1 due to medicaid limitations of cardiac rehab to be completed within 12 weeks.

## 2013-07-04 ENCOUNTER — Encounter (HOSPITAL_COMMUNITY): Payer: Medicaid Other

## 2013-07-06 ENCOUNTER — Ambulatory Visit (INDEPENDENT_AMBULATORY_CARE_PROVIDER_SITE_OTHER): Payer: Medicaid Other | Admitting: Pharmacist Clinician (PhC)/ Clinical Pharmacy Specialist

## 2013-07-06 ENCOUNTER — Encounter (HOSPITAL_COMMUNITY): Payer: Medicaid Other

## 2013-07-06 DIAGNOSIS — I4891 Unspecified atrial fibrillation: Secondary | ICD-10-CM

## 2013-07-06 DIAGNOSIS — Z7901 Long term (current) use of anticoagulants: Secondary | ICD-10-CM

## 2013-07-06 DIAGNOSIS — I480 Paroxysmal atrial fibrillation: Secondary | ICD-10-CM

## 2013-07-06 LAB — POCT INR: INR: 2.1

## 2013-07-06 MED ORDER — WARFARIN SODIUM 4 MG PO TABS: ORAL_TABLET | ORAL | Status: DC

## 2013-07-08 ENCOUNTER — Encounter (HOSPITAL_COMMUNITY): Payer: Medicaid Other

## 2013-07-14 ENCOUNTER — Telehealth: Payer: Self-pay | Admitting: Internal Medicine

## 2013-07-14 ENCOUNTER — Telehealth: Payer: Self-pay | Admitting: *Deleted

## 2013-07-14 NOTE — Telephone Encounter (Signed)
Returned call to [place] with Dr.Paul Luan [place] office no answer.[place].

## 2013-07-14 NOTE — Telephone Encounter (Signed)
Received call from [place] at [place] office she wanted to verify patient takes plaivix and coumadin.Dr.Hilty advised ok to hold coumadin 5 days prior to procedure.Ok to hold plavix 7 days prior to procedure.

## 2013-07-14 NOTE — Telephone Encounter (Signed)
She need clarification if pt can stop Plavix for 7 days and Coumadin for 5 days for a EFI?

## 2013-07-14 NOTE — Telephone Encounter (Signed)
Faxed clearance for epidural steroid injection - hold coumadin for 5 days prior and restart after.

## 2013-07-22 ENCOUNTER — Encounter: Payer: Self-pay | Admitting: Internal Medicine

## 2013-07-22 ENCOUNTER — Ambulatory Visit (INDEPENDENT_AMBULATORY_CARE_PROVIDER_SITE_OTHER): Payer: Medicaid Other | Admitting: Internal Medicine

## 2013-07-22 VITALS — BP 130/78 | HR 62 | Ht 67.0 in | Wt 224.5 lb

## 2013-07-22 DIAGNOSIS — I480 Paroxysmal atrial fibrillation: Secondary | ICD-10-CM

## 2013-07-22 DIAGNOSIS — I4891 Unspecified atrial fibrillation: Secondary | ICD-10-CM

## 2013-07-22 DIAGNOSIS — I2510 Atherosclerotic heart disease of native coronary artery without angina pectoris: Secondary | ICD-10-CM

## 2013-07-22 DIAGNOSIS — I214 Non-ST elevation (NSTEMI) myocardial infarction: Secondary | ICD-10-CM

## 2013-07-22 DIAGNOSIS — E669 Obesity, unspecified: Secondary | ICD-10-CM

## 2013-07-22 DIAGNOSIS — Z9861 Coronary angioplasty status: Secondary | ICD-10-CM

## 2013-07-22 DIAGNOSIS — E785 Hyperlipidemia, unspecified: Secondary | ICD-10-CM

## 2013-07-22 DIAGNOSIS — G4733 Obstructive sleep apnea (adult) (pediatric): Secondary | ICD-10-CM

## 2013-07-22 NOTE — Progress Notes (Signed)
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 He apparently travels significantly and has a pretty poor diet. He reports drinking about 6 alcoholic beverages per week and smokes about one half pack per day for the past 30 years. Recently, while up in [place], he was suffering from a kidney stone and was noted to have atrial fibrillation. He was unaware of this, and was given medication to slow his heart rate. Apparently he converted back into sinus and was instructed to be on aspirin.  He has recently been describing some chest pain which is substernal and radiates to the jaw and down into his right arm. The symptoms last for about 5-10 minutes and go away at rest, if he has been exerting himself. He also has a history of heart disease both in his mother and a father who had high blood pressure.  In addition he filled out an S4 sleepiness scale today which was scored as 12 suggesting a higher than likely possibility of sleep apnea.  He does report difficulty staying asleep, and feeling drowsy throughout the day with the need to take frequent naps.   Paul Todd returns today for followup of his nuclear stress test which was performed on 02/02/2013. This showed essentially no reversible ischemia, however the EF was reduced at 48%. It is unclear to me whether this is a gating abnormality or perhaps due to long-standing hypertension, undiagnosed sleep apnea, or multivessel coronary disease. He did have the episode of paroxysmal atrial fibrillation and continues to have chest pain which is very concerning for angina. He also had a recent lipid profile demonstrating an LDL of 111 and LDL particle number of 1774.  Triglycerides were elevated at 206.  I recommended starting Lipitor 40 mg daily.  Unfortunately, while undergoing work-up, he presented on 02/12/13  with a NSTEMI. He is followed by Paul Todd and had recently been seen in the office for chest pain. He underwent a Myoview on 02/02/13 which returned abnormal with an EF of 48%, but there was no ischemia. He was then scheduled for an elective cardiac cath for the next week. However, on 02/12/13 he woke up with SSCP "tightness" associated with diaphoresis, SOB, and radiation to his jaw and right arm and proceeded to the ED. While in the ED the patient developed nausea and was noted to be bradycardic with HR in the 30s and SBP dropped to the 50s. ECG showed complete heart block but with a narrow escape. He was given atropine 0.5 mg with rise in HR to the 70s, NSR. Patient's second troponin returned abnormal at 0.17 and continued to climb >20. His episode of complete heart block was suspected to be vagally mediated from nausea; however, it was thought that RCA ischemia needed to be ruled out completely. It was decided that he proceed with a cardiac cath.   Cardiac cath on 02/12/13 revealed an acutely occluded RCA which was successfully treated with PTCA and diffuse stenting due to RCA stenoses beyond the point of total occlusion. The patient also had high-grade concomitant proximal LAD disease which required staged PCI. Left ventriculography revealed an ejection fraction of ~50%. There was evidence for mild mid and distal inferior hypocontractility as well. The patient developed atrial fibrillation during the procedure which was rate controlled with an IV beta blocker and then a low dose oral agent. He  underwent staged PCI to LAD on 02/14/13 with successful PTCA/DES x 1 to proximal LAD and he was placed on ASA/Brilinta.  It was felt that the patient would not tolerate atrial fibrillation well and underwent successful TEE/DCCV on 02/15/13. He is on triple therapy with coumadin. The patient had some issues with hematuria (likely from foley cath related injury) prompting more serious discission about his anticoagulation status.  Paul. Burt Todd Todd his options considering his recent MI and multivessel stenting as well as indication for anticoagulation with atrial fib and felt it best that he continue ASA 81 mg, switch brilinta to plavix and continue heparin/warfarin overlap, which would be limited to 3-6 months depending on his clinical course. Of note patient also had some shortness of breath on Brilinta, which was resolved when he was switched to Plavix.  Today he reports doing fairly well other than low back pain. He did have a positive sleep study and still reports difficulty sleeping at night. Although he has not been set with a CPAP machine. We need to try to arrange that. The difficulty may have been affected he lost his insurance. He reports to me that he did however secure a job and will be starting in the next few weeks. He's actually did be working 10 hour days and is concerned about his ability to stay on a seat for that long. He's having problems with his back as mentioned and has seen Paul. Maryjean Todd who is contemplating injections. I had initially cleared him for this however realize that he just had recent stenting and that we would be unable to stop his antiplatelet therapy for at least one year, which is up in February 2016.  PMHx:  Past Medical History  Diagnosis Date  . Nephrolithiasis   . PAF (paroxysmal atrial fibrillation)     during a kidney stone attack  . Dyslipidemia   . CAD (coronary artery disease)     multivessel per cath 02/12/13, s/p stenting to RCA    Past Surgical History  Procedure Laterality Date  . Tee without cardioversion N/A 02/15/2013    Procedure: TRANSESOPHAGEAL ECHOCARDIOGRAM (TEE);  Surgeon: Paul Headings, MD;  Location: [facility];  Service: Cardiovascular;  Laterality: N/A;  . Cardioversion N/A 02/15/2013    Procedure: CARDIOVERSION;  Surgeon: Paul Headings, MD;  [facility]: [facility] ENDOSCOPY;  Service: Cardiovascular;  Laterality: N/A;  . Coronary angioplasty with stent placement   02/12/2013    FAMHx:  Family History  Problem Relation Age of Onset  . Heart disease Mother   . Diabetes Mother   . Diabetes Father     SOCHx:   reports that he quit smoking about 5 months ago. His smoking use included Cigarettes. He has a 17.5 pack-year smoking history. He has never used smokeless tobacco. He reports that he does not drink alcohol or use illicit drugs.  ALLERGIES:  No Known Allergies  ROS: A comprehensive review of systems was negative except for: Constitutional: positive for fatigue and difficulty sleeping, snoring Musculoskeletal: positive for back pain  HOME MEDS: Current Outpatient Prescriptions  Medication Sig Dispense Refill  . atorvastatin (LIPITOR) 80 MG tablet Take 1 tablet (80 mg total) by mouth daily.  90 tablet  3  . clopidogrel (PLAVIX) 75 MG tablet Take 1 tablet (75 mg total) by mouth daily with breakfast.  30 tablet  11  . metoprolol succinate (TOPROL-XL) 25 MG 24 hr tablet Take 1 tablet (25 mg total) by mouth daily.  90 tablet  2  .  nitroGLYCERIN (NITROSTAT) 0.4 MG SL tablet Place 1 tablet (0.4 mg total) under the tongue every 5 (five) minutes x 3 doses as needed for chest pain.  25 tablet  12  . Oxycodone HCl 10 MG TABS Take 10 mg by mouth as needed (pain).      . pantoprazole (PROTONIX) 40 MG tablet Take 1 tablet (40 mg total) by mouth daily.  30 tablet  9  . warfarin (COUMADIN) 4 MG tablet Take 1 to 1.5 tablets by mouth daily as directed by coumadin clinic  135 tablet  1   No current facility-administered medications for this visit.    LABS/IMAGING: No results found for this or any previous visit (from the past 48 hour(s)). No results found.  VITALS: BP 130/78  Pulse 62  Ht 5\' 7"  (1.702 m)  Wt 224 lb 8 oz (101.833 kg)  BMI 35.15 kg/m2  EXAM: deferred  EKG: Normal sinus rhythm at 62  ASSESSMENT:  1. Tobacco abuse 2. CAD s/p PCi to the entire RCA (50+ mm) with DES in 02/2013 and mid-LAD 02/2013 3. Paroxysmal atrial  fibrillation 4. OSA - needs to be fitted with CPAP 5. Low back pain, difficulty walking  PLAN: 1.   Mr. Sakuma had successful PCI with a long area of stenting of the right coronary artery, including 2 3 x 38 mm drug eluting stents. He also had a drug-eluting stent to the mid LAD.  The stents were placed in February of 2015. He has done well without any recurrent angina. Unfortunately he is on aspirin, Plavix and warfarin due to post procedural atrial fibrillation. As it has been 6 months since his stenting, I feel that we could safely discontinue his aspirin and keep him on Plavix and warfarin therapy. This should decrease his bleeding risk. I discussed this with Paul. Gwenlyn Found (interventional cardiology) in the office and he agrees.  Unfortunately, as previously mentioned we cannot stop his antiplatelet therapy until February 2016. I would not recommend injections in the back on antiplatelet and anticoagulant therapy. He was quite disappointed however I think the risk is still too prohibitive given the extensive amount of stents that he had placed that there is a high risk of stent thrombosis if he were to come off of his platelet medications. This could lead to acute MI.  Plan to see him back in 6 months at which time we may discontinue his warfarin altogether if he maintains sinus rhythm and keep him on Plavix. We may add back aspirin at that time.  Pixie Casino, MD, [facility] Attending Cardiologist [facility]  HILTY,Kenneth C 07/22/2013, 1:26 PM

## 2013-07-22 NOTE — Patient Instructions (Addendum)
Your physician wants you to follow-up in: 6 months. You will receive a reminder letter in the mail two months in advance. If you don't receive a letter, please call our office to schedule the follow-up appointment.  STOP aspirin.  Please contact Erasmo Downer about your coumadin monitoring once you go back to work - she can order it to be done at a [facility] lab so you can go at your convenience or on weekends.   We will contact you about your CPAP mask.

## 2013-07-25 ENCOUNTER — Telehealth: Payer: Self-pay | Admitting: Internal Medicine

## 2013-07-25 NOTE — Telephone Encounter (Signed)
She wanted you to know she received your message, concerning Paul Todd.

## 2013-07-25 NOTE — Telephone Encounter (Signed)
Left message for rosa to call

## 2013-07-27 ENCOUNTER — Ambulatory Visit: Payer: Self-pay | Admitting: Pharmacist Clinician (PhC)/ Clinical Pharmacy Specialist

## 2013-07-27 ENCOUNTER — Telehealth: Payer: Self-pay | Admitting: Pharmacist Clinician (PhC)/ Clinical Pharmacy Specialist

## 2013-07-27 MED ORDER — RIVAROXABAN 20 MG PO TABS: 20.0000 mg | ORAL_TABLET | Freq: Every day | ORAL | Status: DC

## 2013-07-27 NOTE — Telephone Encounter (Signed)
Pt called, has new job 1 hr out of town, unable to keep INR appointments in future.  Wants to know if able to go to lab on Saturdays.  Reviewed with [name], will switch patient to Xarelto 20mg  qd, as mfr has coupon for $0 copay for 2015.  Pt will see [name] in Jan 2016, may d/c at that time.  Pt agreeable to change, will have wife pick up coupon card.  Reviewed safety information, bleeding risks unchanged, pt voiced understanding.

## 2013-08-03 ENCOUNTER — Ambulatory Visit: Payer: BC Managed Care – PPO | Admitting: Pharmacist Clinician (PhC)/ Clinical Pharmacy Specialist

## 2013-08-05 ENCOUNTER — Emergency Department (HOSPITAL_COMMUNITY)
Admission: EM | Admit: 2013-08-05 | Discharge: 2013-08-05 | Disposition: A | Discharge status: Home / Self Care | Payer: Medicaid Other | Attending: Emergency Medicine | Admitting: Emergency Medicine

## 2013-08-05 ENCOUNTER — Encounter (HOSPITAL_COMMUNITY): Payer: Self-pay | Admitting: Emergency Medicine

## 2013-08-05 ENCOUNTER — Emergency Department (HOSPITAL_COMMUNITY): Payer: Medicaid Other

## 2013-08-05 DIAGNOSIS — G8929 Other chronic pain: Secondary | ICD-10-CM | POA: Diagnosis not present

## 2013-08-05 DIAGNOSIS — N23 Unspecified renal colic: Secondary | ICD-10-CM | POA: Insufficient documentation

## 2013-08-05 DIAGNOSIS — Z79899 Other long term (current) drug therapy: Secondary | ICD-10-CM | POA: Diagnosis not present

## 2013-08-05 DIAGNOSIS — Z9861 Coronary angioplasty status: Secondary | ICD-10-CM | POA: Insufficient documentation

## 2013-08-05 DIAGNOSIS — Z792 Long term (current) use of antibiotics: Secondary | ICD-10-CM | POA: Insufficient documentation

## 2013-08-05 DIAGNOSIS — Z87891 Personal history of nicotine dependence: Secondary | ICD-10-CM | POA: Insufficient documentation

## 2013-08-05 DIAGNOSIS — R109 Unspecified abdominal pain: Secondary | ICD-10-CM | POA: Diagnosis not present

## 2013-08-05 DIAGNOSIS — Z7902 Long term (current) use of antithrombotics/antiplatelets: Secondary | ICD-10-CM | POA: Insufficient documentation

## 2013-08-05 DIAGNOSIS — R319 Hematuria, unspecified: Secondary | ICD-10-CM | POA: Diagnosis present

## 2013-08-05 DIAGNOSIS — E785 Hyperlipidemia, unspecified: Secondary | ICD-10-CM | POA: Diagnosis not present

## 2013-08-05 DIAGNOSIS — I2510 Atherosclerotic heart disease of native coronary artery without angina pectoris: Secondary | ICD-10-CM | POA: Insufficient documentation

## 2013-08-05 LAB — CBC
HCT: 47.5 % (ref 39.0–52.0)
Hemoglobin: 15.8 g/dL (ref 13.0–17.0)
MCH: 31.5 pg (ref 26.0–34.0)
MCHC: 33.3 g/dL (ref 30.0–36.0)
MCV: 94.6 fL (ref 78.0–100.0)
Platelets: 175 10*3/uL (ref 150–400)
RBC: 5.02 MIL/uL (ref 4.22–5.81)
RDW: 13.9 % (ref 11.5–15.5)
WBC: 11.3 10*3/uL — ABNORMAL HIGH (ref 4.0–10.5)

## 2013-08-05 LAB — BASIC METABOLIC PANEL
Anion gap: 12 (ref 5–15)
BUN: 21 mg/dL (ref 6–23)
CO2: 22 mEq/L (ref 19–32)
Calcium: 8.9 mg/dL (ref 8.4–10.5)
Chloride: 108 mEq/L (ref 96–112)
Creatinine, Ser: 0.82 mg/dL (ref 0.50–1.35)
GFR calc Af Amer: >90 mL/min (ref >90)
GFR calc non Af Amer: >90 mL/min (ref >90)
Glucose, Bld: 106 mg/dL — ABNORMAL HIGH (ref 70–99)
Potassium: 4.3 mEq/L (ref 3.7–5.3)
Sodium: 142 mEq/L (ref 137–147)

## 2013-08-05 LAB — URINALYSIS, ROUTINE W REFLEX MICROSCOPIC
GLUCOSE, UA: NEGATIVE mg/dL
KETONES UR: 15 mg/dL — AB
Nitrite: POSITIVE — AB
Specific Gravity, Urine: 1.03 (ref 1.005–1.030)
Urobilinogen, UA: 1 mg/dL (ref 0.0–1.0)
pH: 5 (ref 5.0–8.0)

## 2013-08-05 LAB — PROTIME-INR
INR: 1.5 — ABNORMAL HIGH (ref 0.00–1.49)
PROTHROMBIN TIME: 18.1 s — AB (ref 11.6–15.2)

## 2013-08-05 LAB — URINE MICROSCOPIC-ADD ON

## 2013-08-05 MED ORDER — HYDROMORPHONE HCL PF 1 MG/ML IJ SOLN
1.0000 mg | Freq: Once | INTRAMUSCULAR | Status: AC
  Administered 2013-08-05: 1 mg via INTRAVENOUS
  Filled 2013-08-05: qty 1

## 2013-08-05 MED ORDER — SODIUM CHLORIDE 0.9 % IV BOLUS (SEPSIS)
1000.0000 mL | Freq: Once | INTRAVENOUS | Status: AC
  Administered 2013-08-05: 1000 mL via INTRAVENOUS

## 2013-08-05 MED ORDER — PROMETHAZINE HCL 25 MG PO TABS: 25.0000 mg | ORAL_TABLET | Freq: Three times a day (TID) | ORAL | Status: DC | PRN

## 2013-08-05 MED ORDER — OXYCODONE-ACETAMINOPHEN 5-325 MG PO TABS: 1.0000 | ORAL_TABLET | Freq: Four times a day (QID) | ORAL | Status: DC | PRN

## 2013-08-05 NOTE — ED Notes (Signed)
Pt transported to CT ?

## 2013-08-05 NOTE — ED Provider Notes (Signed)
CSN: [id number]     Arrival date & time 08/05/13  1448 History   First MD Initiated Contact with Patient 08/05/13 1738     Chief Complaint  Patient presents with  . Hematuria  . Back Pain     (Consider location/radiation/quality/duration/timing/severity/associated sxs/prior Treatment) HPI Patient presents to the emergency department with lower abdominal pain, and hematuria, that started yesterday.  The patient, states, that his chronic back pain, but this has worsened.  Patient, states, that he does not have any fever, chest pain, shortness of breath, weakness, dizziness, nausea, vomiting, diarrhea, headache, blurred vision, decreased appetite dysuria testicular pain, or syncope.  The patient, states, that he did not take any medications prior to arrival.  The patient, states, that nothing seems make his condition, better or worse.  Patient, states, that he has had kidney stones in the past. Past Medical History  Diagnosis Date  . Nephrolithiasis   . PAF (paroxysmal atrial fibrillation)     during a kidney stone attack  . Dyslipidemia   . CAD (coronary artery disease)     multivessel per cath 02/12/13, s/p stenting to RCA   Past Surgical History  Procedure Laterality Date  . Tee without cardioversion N/A 02/15/2013    Procedure: TRANSESOPHAGEAL ECHOCARDIOGRAM (TEE);  Surgeon: Thayer Headings, MD;  Location: [place];  Service: Cardiovascular;  Laterality: N/A;  . Cardioversion N/A 02/15/2013    Procedure: CARDIOVERSION;  Surgeon: Thayer Headings, MD;  Location: [facility] ENDOSCOPY;  Service: Cardiovascular;  Laterality: N/A;  . Coronary angioplasty with stent placement  02/12/2013   Family History  Problem Relation Age of Onset  . Heart disease Mother   . Diabetes Mother   . Diabetes Father    History  Substance Use Topics  . Smoking status: Former Smoker -- 0.50 packs/day for 35 years    Types: Cigarettes    Quit date: 02/12/2013  . Smokeless tobacco: Never Used  . Alcohol Use: No      Review of Systems All other systems negative except as documented in the HPI. All pertinent positives and negatives as reviewed in the HPI.   Allergies  Review of patient's allergies indicates no known allergies.  Home Medications   Prior to Admission medications   Medication Sig Start Date End Date Taking? Authorizing Provider  atorvastatin (LIPITOR) 80 MG tablet Take 80 mg by mouth daily.   Yes Historical Provider, MD  clopidogrel (PLAVIX) 75 MG tablet Take 75 mg by mouth daily.   Yes Historical Provider, MD  metoprolol succinate (TOPROL-XL) 25 MG 24 hr tablet Take 25 mg by mouth daily.   Yes Historical Provider, MD  nitroGLYCERIN (NITROSTAT) 0.4 MG SL tablet Place 0.4 mg under the tongue every 5 (five) minutes as needed for chest pain.   Yes Historical Provider, MD  Oxycodone HCl 10 MG TABS Take 10 mg by mouth every 8 (eight) hours as needed (pain).    Yes Historical Provider, MD  pantoprazole (PROTONIX) 40 MG tablet Take 40 mg by mouth daily.   Yes Historical Provider, MD  rivaroxaban (XARELTO) 20 MG TABS tablet Take 20 mg by mouth daily with supper.   Yes Historical Provider, MD   BP 156/70  Pulse 60  Temp(Src) 97.5 F (36.4 C) (Oral)  Resp 21  SpO2 96% Physical Exam  Nursing note and vitals reviewed. Constitutional: He is oriented to person, place, and time. He appears well-developed and well-nourished. No distress.  HENT:  Head: Normocephalic and atraumatic.  Mouth/Throat: Oropharynx is clear  and moist.  Eyes: Pupils are equal, round, and reactive to light.  Neck: Normal range of motion. Neck supple.  Cardiovascular: Normal rate, regular rhythm and normal heart sounds.  Exam reveals no gallop and no friction rub.   No murmur heard. Pulmonary/Chest: Effort normal and breath sounds normal. No respiratory distress.  Abdominal: Soft. Normal appearance and bowel sounds are normal. He exhibits no distension. There is tenderness. There is no rigidity, no rebound and no  guarding.    Neurological: He is alert and oriented to person, place, and time. He exhibits normal muscle tone. Coordination normal.  Skin: Skin is warm and dry.    ED Course  Procedures (including critical care time) Labs Review Labs Reviewed  URINALYSIS, ROUTINE W REFLEX MICROSCOPIC - Abnormal; Notable for the following:    Color, Urine RED (*)    APPearance TURBID (*)    Hgb urine dipstick LARGE (*)    Bilirubin Urine LARGE (*)    Ketones, ur 15 (*)    Protein, ur >300 (*)    Nitrite POSITIVE (*)    Leukocytes, UA MODERATE (*)    All other components within normal limits  PROTIME-INR - Abnormal; Notable for the following:    Prothrombin Time 18.1 (*)    INR 1.50 (*)    All other components within normal limits  URINE MICROSCOPIC-ADD ON - Abnormal; Notable for the following:    Squamous Epithelial / LPF FEW (*)    All other components within normal limits  CBC - Abnormal; Notable for the following:    WBC 11.3 (*)    All other components within normal limits  BASIC METABOLIC PANEL - Abnormal; Notable for the following:    Glucose, Bld 106 (*)    All other components within normal limits    Imaging Review Ct Abdomen Pelvis Wo Contrast  08/05/2013   CLINICAL DATA:  Gross hematuria. Low back pain. Prior history of urinary tract calculi.  EXAM: CT ABDOMEN AND PELVIS WITHOUT CONTRAST  TECHNIQUE: Multidetector CT imaging of the abdomen and pelvis was performed following the standard protocol without IV contrast.  COMPARISON:  None.  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 Approximate 7 mm calculus in a mid calix of the left kidney. Approximate 3 mm calculus in a mid to lower pole calix of the left kidney. Arterial calcifications within the intra renal arteries of the right kidney. No right urinary tract calculi.  Cortical cysts involving both kidneys, the largest approximating 2 cm arising from the mid right kidney. Focal scarring involving the mid pole of the left kidney. No other significant focal parenchymal abnormality involving either kidney.  Normal unenhanced appearance of the liver, spleen, pancreas, and adrenal glands. Gallbladder contracted as there is food within the stomach. No biliary ductal dilation. Mild to moderate aortoiliac atherosclerosis without aneurysm. No significant lymphadenopathy.  Normal-appearing stomach, filled with food. Normal-appearing small bowel. Moderate stool burden throughout the colon. Tortuous and redundant sigmoid colon. Accessory focus of splenic tissue anterior to the lower pole of the spleen, adjacent to the proximal transverse colon, mimicking a diverticulum. Normal appendix in the right upper pelvis. No ascites.  Urinary bladder decompressed and unremarkable. Normal-appearing prostate gland and seminal vesicles. Numerous pelvic phleboliths.  Bone window images demonstrate bilateral L5 pars defects with minimal, insignificant spondylolisthesis of L5 on S1; chronic circumferential disc  bulge with calcification posterior and lateral annular fibers at L2-3, circumferential disc bulge with borderline spinal stenosis at L3-4; degenerative changes and partial ankylosis of the right SI joint. Visualized lung bases clear. Right coronary artery stent. Heart upper normal in size to slightly enlarged.  IMPRESSION: 1. Approximate 10 mm calculus within the left renal pelvis. Mild dilation of the renal pelvis without calyceal dilation or ureteral dilation is consistent with chronic UPJ stenosis. 2. No obstructing left ureteral calculus. 3. Approximate 7 mm calculus in a mid calix of the left kidney and approximate 3 mm calculus in a mid to lower pole calix of the left kidney. 4. No right urinary tract calculi. 5. Focal scarring involving the mid left kidney. 6. Osseous findings as above.    Electronically Signed   By: Evangeline Dakin M.D.   On: 08/05/2013 18:50      Patient most likely is having hematuria, do to the renal pelvis stone is large and he has a narrowed UPJ.  Patient will be referred to urology.  Told to return here for any worsening in his symptoms.  Patient be given IV fluids for hydration status as he does work ups and heat and he states it feels, that he's dehydrated with muscle cramping at times over the last several days  Brent General, PA-C 08/05/13 1946

## 2013-08-05 NOTE — Discharge Instructions (Signed)
Return here as needed.  Follow up with the urologist provided.  Increase your fluid intake °

## 2013-08-05 NOTE — ED Notes (Signed)
Pt sent by [facility] for gross hematuria and lower back pain; pt took coumadin until recently when switched to xeralto last night; hematuria noted prior to switching

## 2013-08-05 NOTE — ED Provider Notes (Signed)
Medical screening examination/treatment/procedure(s) were performed by non-physician practitioner and as supervising physician I was immediately available for consultation/collaboration.   Dorie Rank, MD 08/05/13 [phone]

## 2013-08-08 ENCOUNTER — Telehealth: Payer: Self-pay | Admitting: *Deleted

## 2013-08-08 NOTE — Telephone Encounter (Signed)
Faxed order to [facility] for CPAP supplies

## 2013-08-09 ENCOUNTER — Encounter: Payer: Self-pay | Admitting: *Deleted

## 2013-08-09 ENCOUNTER — Telehealth: Payer: Self-pay | Admitting: Internal Medicine

## 2013-08-09 NOTE — Telephone Encounter (Signed)
Wants to know if he can have general anesthesia and if he can stop his Xarelto.Pt will not have to stop his Plavix.  The pt will be having cysto left ureteroscopy and urethral stent placement. Please fax to [phone].

## 2013-08-09 NOTE — Telephone Encounter (Signed)
Opened in error

## 2013-08-09 NOTE — Telephone Encounter (Signed)
Deferred to [name]

## 2013-08-09 NOTE — Telephone Encounter (Signed)
Faxed letter of clearance to [facility] - [place] for general anesthesia. Patient can stop Xarelto 2 days prior and restart after. Patient may continue plavix.   Cysto, bilateral retrograde pyelogram, left ureteroscopy and stent placement - Dr. Junious Silk

## 2013-08-22 ENCOUNTER — Other Ambulatory Visit: Payer: Self-pay | Admitting: Urology

## 2013-08-23 ENCOUNTER — Encounter (HOSPITAL_COMMUNITY)
Admission: RE | Admit: 2013-08-23 | Discharge: 2013-08-23 | Disposition: A | Discharge status: Home / Self Care | Payer: Medicaid Other | Source: Ambulatory Visit | Attending: Urology | Admitting: Urology

## 2013-08-23 ENCOUNTER — Encounter (HOSPITAL_COMMUNITY): Payer: Self-pay | Admitting: Pharmacy Technician

## 2013-08-23 ENCOUNTER — Encounter (HOSPITAL_COMMUNITY): Payer: Self-pay

## 2013-08-23 DIAGNOSIS — Z01818 Encounter for other preprocedural examination: Secondary | ICD-10-CM | POA: Insufficient documentation

## 2013-08-23 DIAGNOSIS — R310 Gross hematuria: Secondary | ICD-10-CM | POA: Diagnosis present

## 2013-08-23 DIAGNOSIS — N200 Calculus of kidney: Secondary | ICD-10-CM | POA: Diagnosis present

## 2013-08-23 HISTORY — DX: Unspecified osteoarthritis, unspecified site: M19.90

## 2013-08-23 HISTORY — DX: Essential (primary) hypertension: I10

## 2013-08-23 HISTORY — DX: Acute myocardial infarction, unspecified: I21.9

## 2013-08-23 LAB — BASIC METABOLIC PANEL
ANION GAP: 12 (ref 5–15)
BUN: 18 mg/dL (ref 6–23)
CHLORIDE: 103 meq/L (ref 96–112)
CO2: 25 mEq/L (ref 19–32)
Calcium: 9.2 mg/dL (ref 8.4–10.5)
Creatinine, Ser: 0.83 mg/dL (ref 0.50–1.35)
GFR calc Af Amer: >90 mL/min (ref >90)
GFR calc non Af Amer: >90 mL/min (ref >90)
Glucose, Bld: 147 mg/dL — ABNORMAL HIGH (ref 70–99)
Potassium: 4.6 mEq/L (ref 3.7–5.3)
Sodium: 140 mEq/L (ref 137–147)

## 2013-08-23 LAB — CBC
HCT: 43.8 % (ref 39.0–52.0)
HEMOGLOBIN: 14.6 g/dL (ref 13.0–17.0)
MCH: 30.5 pg (ref 26.0–34.0)
MCHC: 33.3 g/dL (ref 30.0–36.0)
MCV: 91.6 fL (ref 78.0–100.0)
PLATELETS: 165 10*3/uL (ref 150–400)
RBC: 4.78 MIL/uL (ref 4.22–5.81)
RDW: 13.2 % (ref 11.5–15.5)
WBC: 9.5 10*3/uL (ref 4.0–10.5)

## 2013-08-23 LAB — PROTIME-INR
INR: 1.37 (ref 0.00–1.49)
Prothrombin Time: 16.9 seconds — ABNORMAL HIGH (ref 11.6–15.2)

## 2013-08-23 LAB — APTT: APTT: 34 s (ref 24–37)

## 2013-08-23 NOTE — Patient Instructions (Signed)
Paul Todd  08/23/2013   Your procedure is scheduled on: 09/02/13    Report to [facility].  Follow the Signs to [place] at   0930     am  Call this number if you have problems the morning of surgery: [phone]   Remember:   Do not eat food or drink liquids after midnight.   Take these medicines the morning of surgery with A SIP OF WATER:    Do not wear jewelry,   Do not wear lotions, powders, or perfumes. , deodorant     Men may shave face and neck.  Do not bring valuables to the hospital.  Contacts, dentures or bridgework may not be worn into surgery.       Patients discharged the day of surgery will not be allowed to drive  home.  Name and phone number of your driver:      Please read over the following fact sheets that you were given: [facility] - Preparing for Surgery Before surgery, you can play an important role.  Because skin is not sterile, your skin needs to be as free of germs as possible.  You can reduce the number of germs on your skin by washing with CHG (chlorahexidine gluconate) soap before surgery.  CHG is an antiseptic cleaner which kills germs and bonds with the skin to continue killing germs even after washing. Please DO NOT use if you have an allergy to CHG or antibacterial soaps.  If your skin becomes reddened/irritated stop using the CHG and inform your nurse when you arrive at Short Stay. Do not shave (including legs and underarms) for at least 48 hours prior to the first CHG shower.  You may shave your face/neck. Please follow these instructions carefully:  1.  Shower with CHG Soap the night before surgery and the  morning of Surgery.  2.  If you choose to wash your hair, wash your hair first as usual with your  normal  shampoo.  3.  After you shampoo, rinse your hair and body thoroughly to remove the  shampoo.                           4.  Use CHG as you would any other liquid soap.  You can apply chg directly  to the skin and wash                        Gently with a scrungie or clean washcloth.  5.  Apply the CHG Soap to your body ONLY FROM THE NECK DOWN.   Do not use on face/ open                           Wound or open sores. Avoid contact with eyes, ears mouth and genitals (private parts).                       Wash face,  Genitals (private parts) with your normal soap.             6.  Wash thoroughly, paying special attention to the area where your surgery  will be performed.  7.  Thoroughly rinse your body with warm water from the neck down.  8.  DO NOT shower/wash with your normal soap after using and rinsing off  the CHG Soap.  9.  Pat yourself dry with a clean towel.            10.  Wear clean pajamas.            11.  Place clean sheets on your bed the night of your first shower and do not  sleep with pets. Day of Surgery : Do not apply any lotions/deodorants the morning of surgery.  Please wear clean clothes to the hospital/surgery center.  FAILURE TO FOLLOW THESE INSTRUCTIONS MAY RESULT IN THE CANCELLATION OF YOUR SURGERY PATIENT SIGNATURE_________________________________  NURSE SIGNATURE__________________________________  ________________________________________________________________________ , coughing and deep breathing exercises, leg exercises

## 2013-08-23 NOTE — Progress Notes (Signed)
Patient states he has been given preprocedure instructions regarding his Plavix and Xarelto.

## 2013-08-23 NOTE — Progress Notes (Signed)
EKG- 07/22/13 EPIC  CXR 02/12/13 EPIC  LOV- DDR [facility]- 07/22/13 EPIC

## 2013-09-01 NOTE — H&P (Signed)
History of Present Illness Consult for gross hematuria, left hydronephrosis and left renal stones referred by Dr. Delon [place] Lawyer. His primary care physician is Dr. Benna Dunks. His cardiologist is [name].    Last week patient developed gross hematuria along with left flank and back pain. CT scan revealed an 11 mm left renal pelvic stone, 7 mm left mid pole stone and a smaller 3 mm stone in the left lower pole. These may be visible on the scalp it was difficult to tell. This also dilation of the left renal calyces and renal pelvis similar to a UPJ obstruction appearance.     His pain is improved today. He's had no prior history of kidney stones, GU surgery or UPJ obstruction. He did have an attack of left flank pain with nausea and vomiting October 2014. He was admitted to a hospital in [place] and treated for A. fib. He had a similar attack May 2015.    He's had some significant exposures in that he is a former smoker. Also he's been in the [employer] and exposed to chemicals and diets over the years. He has had no chemotherapy or radiation.    Also noted on the CT were bilateral renal cysts largest was 2 cm in the right kidney.    He typically voids with a good stream without frequency or urgency. He has noticed less frequent voiding and a slower stream after being seen in the emergency department. He has had no dysuria. His gross hematuria is clearing.    He is on Xarelto and Plavix for 3 heart stents February 2015.    July 2015 BUN 21, creatinine 0.8. His INR was 1.5.   Past Medical History Problems  1. History of acute myocardial infarction (412) 2. History of arthritis (V13.4) 3. History of atrial fibrillation (V12.59) 4. History of cardiac disorder (V12.50) 5. History of cardiac murmur (V12.59) 6. History of esophageal reflux (V12.79) 7. History of hypercholesterolemia (V12.29) 8. History of hypertension (V12.59) 9. History of renal calculi  (V13.01) 10. History of sleep apnea (V13.89)  Surgical History Problems  1. History of Atrial Cardioversion 2. History of Cath Laser Angioplasty  Current Meds 1. Atorvastatin Calcium 80 MG Oral Tablet;  Therapy: (Recorded:03Aug2015) to Recorded 2. Clopidogrel Bisulfate 75 MG Oral Tablet;  Therapy: (Recorded:03Aug2015) to Recorded 3. Metoprolol Succinate ER 25 MG Oral Tablet Extended Release 24 Hour;  Therapy: (Recorded:03Aug2015) to Recorded 4. Nitroglycerin 0.4 MG SUBL;  Therapy: (Recorded:03Aug2015) to Recorded 5. Pantoprazole Sodium 40 MG Oral Tablet Delayed Release;  Therapy: (Recorded:03Aug2015) to Recorded 6. Xarelto 20 MG Oral Tablet;  Therapy: (Recorded:03Aug2015) to Recorded  Allergies Medication  1. No Known Drug Allergies  Family History Problems  1. Family history of Deceased : Mother 2. Family history of cardiac disorder (V17.49) : Mother 3. Family history of diabetes mellitus (V18.0) : Mother, Father  Social History Problems    Alcohol use (V49.89)   Caffeine use (V49.89)   Former smoker (V15.82)   Married   Number of children   Occupation  Review of Systems Genitourinary, constitutional, skin, eye, otolaryngeal, hematologic/lymphatic, cardiovascular, pulmonary, endocrine, musculoskeletal, gastrointestinal, neurological and psychiatric system(s) were reviewed and pertinent findings if present are noted.  Gastrointestinal: constipation.  Constitutional: night sweats and feeling tired (fatigue).  Hematologic/Lymphatic: a tendency to easily bruise.    Vitals Vital Signs [Data Includes: Last 1 Day]  Recorded: 03Aug2015 10:05AM  Height: 5 ft 7 in Weight: 220 lb  BMI Calculated: 34.46 BSA Calculated: 2.11 Blood Pressure:  142 / 83 Temperature: 97.6 F Heart Rate: 51  Physical Exam Constitutional: Well nourished and well developed . No acute distress.  ENT:. The ears and nose are normal in appearance.  Neck: The appearance of the neck is normal  and no neck mass is present.  Pulmonary: No respiratory distress and normal respiratory rhythm and effort.  Cardiovascular: Heart rate and rhythm are normal . No peripheral edema.  Abdomen: The abdomen is soft and nontender. No masses are palpated. No CVA tenderness. No hernias are palpable. No hepatosplenomegaly noted.  Rectal: Rectal exam demonstrates normal sphincter tone, no tenderness and no masses. The prostate has no nodularity and is not tender. The left seminal vesicle is nonpalpable. The right seminal vesicle is nonpalpable. The perineum is normal on inspection.  Genitourinary: Examination of the penis demonstrates no discharge, no masses, no lesions and a normal meatus. The scrotum is without lesions. The right epididymis is palpably normal and non-tender. The left epididymis is palpably normal and non-tender. The right testis is non-tender and without masses. The left testis is non-tender and without masses.  Lymphatics: The femoral and inguinal nodes are not enlarged or tender.  Skin: Normal skin turgor, no visible rash and no visible skin lesions.  Neuro/Psych:. Mood and affect are appropriate.    Results/Data Urine [Data Includes: Last 1 Day]   [id number]  COLOR YELLOW   APPEARANCE CLEAR   SPECIFIC GRAVITY <1.005   pH 5.5   GLUCOSE NEG mg/dL  BILIRUBIN NEG   KETONE NEG mg/dL  BLOOD LARGE   PROTEIN NEG mg/dL  UROBILINOGEN 0.2 mg/dL  NITRITE NEG   LEUKOCYTE ESTERASE NEG   SQUAMOUS EPITHELIAL/HPF RARE   WBC 0-2 WBC/hpf  RBC 3-6 RBC/hpf  BACTERIA NONE SEEN   CRYSTALS NONE SEEN   CASTS NONE SEEN   Other MUCUS    Old records or history reviewed:Marland Kitchen  The following images/tracing/specimen were independently visualized:  CT.    Procedure KUB today-normal bone and bowel gas pattern. The stones are visible in the left kidney. The 11 mm stone remains of the left UPJ and a smaller stone in the left renal cortex.   Assessment Assessed  1. Hydronephrosis (591) 2.  Nephrolithiasis (592.0) 3. 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 UA With REFLEX; [Do Not Release]; Status:Complete;   Done: [id number] 09:38AM Nephrolithiasis  2. Follow-up Schedule Surgery Office  Follow-up  Status: Complete  Done: 03Aug2015 3. KUB; Status:Complete;   Done: [id number] 12:00AM  Discussion/Summary Nephrolithiasis-the left renal pelvic stone is likely ball valving and causing intermittent obstruction although it could also be related to UPJ obstruction. We discussed the nature risk and benefits of surveillance left ureteroscopy shockwave lithotripsy or PCNL. Given his recent heart stents and anticoagulation we may need to hold off on treatment for a few months. Fortunately the obstruction seems to be intermittent. We discussed if he ever developed uncontrolled pain fevers chills nausea or vomiting we might be forced to place a left ureteral stent. He would like to go ahead and proceed with ureteroscopy. He is already had 3 episodes of severe pain requiring he go to the emergency department and his symptoms seem to be getting worse now with hematuria. We discussed the risk of general anesthesia such as heart attack and death, but again he continues to be symptomatic. Therefore we'll get clearance with [name] for general anesthesia as well as determine if he can come off Xarelto I would be okay  to continue the Plavix. I discussed specifically the nature risks benefits and alternatives to ureteroscopy and that he may need a staged procedure.     Gross hematuria-this certainly warrants evaluation and normally cystoscopy with retrograde pyelograms would be a good option in combination with ureteroscopy but again the patient had recent stents placed 3. Therefore we'll get a CT hematuria protocol without the noncontrast phase as this was just done. We will consider our options based on the CT results versus continued surveillance for a more  urgent need for ureteroscopy. We'll also evaluate him with cystoscopy in the office. As above he does not want to proceed with continued surveillance therefore when we go to the operating room we'll do an exam under anesthesia as well as cystoscopy with bilateral retrograde pyelograms.    Left hydronephrosis-this may be related to the kidney stone ball valving or could be a UPJ obstruction developing which could be congenital or malignant. As above, I planned to evaluate with CT hematuria protocol and consider need for ureteroscopy, the patient would like to proceed with intervention.      Signatures Electronically signed by : Festus Aloe, M.D.; Aug 08 2013 11:54AM EST  ADD: [name] cleared for gen anes; Ok to stop Xarelto. Pt will cont plavix.

## 2013-09-02 ENCOUNTER — Observation Stay (HOSPITAL_COMMUNITY)
Admission: RE | Admit: 2013-09-02 | Discharge: 2013-09-03 | Disposition: A | Discharge status: Home / Self Care | Payer: Medicaid Other | Source: Ambulatory Visit | Attending: Urology | Admitting: Urology

## 2013-09-02 ENCOUNTER — Ambulatory Visit (HOSPITAL_COMMUNITY): Payer: Medicaid Other | Admitting: Certified Registered Nurse Anesthetist

## 2013-09-02 ENCOUNTER — Encounter (HOSPITAL_COMMUNITY)
Admission: RE | Disposition: A | Discharge status: Home / Self Care | Payer: Self-pay | Source: Ambulatory Visit | Attending: Urology

## 2013-09-02 ENCOUNTER — Encounter (HOSPITAL_COMMUNITY): Payer: Self-pay | Admitting: *Deleted

## 2013-09-02 ENCOUNTER — Encounter (HOSPITAL_COMMUNITY): Payer: Medicaid Other | Admitting: Certified Registered Nurse Anesthetist

## 2013-09-02 DIAGNOSIS — N3020 Other chronic cystitis without hematuria: Secondary | ICD-10-CM | POA: Insufficient documentation

## 2013-09-02 DIAGNOSIS — D1809 Hemangioma of other sites: Secondary | ICD-10-CM | POA: Diagnosis not present

## 2013-09-02 DIAGNOSIS — E7800 Pure hypercholesterolemia, unspecified: Secondary | ICD-10-CM | POA: Diagnosis not present

## 2013-09-02 DIAGNOSIS — Z7902 Long term (current) use of antithrombotics/antiplatelets: Secondary | ICD-10-CM | POA: Diagnosis not present

## 2013-09-02 DIAGNOSIS — G4730 Sleep apnea, unspecified: Secondary | ICD-10-CM | POA: Diagnosis not present

## 2013-09-02 DIAGNOSIS — N200 Calculus of kidney: Secondary | ICD-10-CM | POA: Diagnosis not present

## 2013-09-02 DIAGNOSIS — K219 Gastro-esophageal reflux disease without esophagitis: Secondary | ICD-10-CM | POA: Diagnosis not present

## 2013-09-02 DIAGNOSIS — I252 Old myocardial infarction: Secondary | ICD-10-CM | POA: Diagnosis not present

## 2013-09-02 DIAGNOSIS — Z79899 Other long term (current) drug therapy: Secondary | ICD-10-CM | POA: Diagnosis not present

## 2013-09-02 DIAGNOSIS — N1330 Unspecified hydronephrosis: Secondary | ICD-10-CM | POA: Diagnosis not present

## 2013-09-02 DIAGNOSIS — R310 Gross hematuria: Secondary | ICD-10-CM | POA: Diagnosis present

## 2013-09-02 DIAGNOSIS — M129 Arthropathy, unspecified: Secondary | ICD-10-CM | POA: Insufficient documentation

## 2013-09-02 DIAGNOSIS — I10 Essential (primary) hypertension: Secondary | ICD-10-CM | POA: Insufficient documentation

## 2013-09-02 DIAGNOSIS — I4891 Unspecified atrial fibrillation: Secondary | ICD-10-CM | POA: Diagnosis not present

## 2013-09-02 DIAGNOSIS — Z87891 Personal history of nicotine dependence: Secondary | ICD-10-CM | POA: Diagnosis not present

## 2013-09-02 DIAGNOSIS — R011 Cardiac murmur, unspecified: Secondary | ICD-10-CM | POA: Insufficient documentation

## 2013-09-02 DIAGNOSIS — D414 Neoplasm of uncertain behavior of bladder: Secondary | ICD-10-CM | POA: Diagnosis not present

## 2013-09-02 DIAGNOSIS — R1110 Vomiting, unspecified: Secondary | ICD-10-CM | POA: Diagnosis present

## 2013-09-02 HISTORY — PX: CYSTOSCOPY WITH RETROGRADE PYELOGRAM, URETEROSCOPY AND STENT PLACEMENT: SHX5789

## 2013-09-02 SURGERY — CYSTOURETEROSCOPY, WITH RETROGRADE PYELOGRAM AND STENT INSERTION
Anesthesia: General | Site: Bladder

## 2013-09-02 MED ORDER — STERILE WATER FOR IRRIGATION IR SOLN
Status: DC | PRN
  Administered 2013-09-02: 150 mL

## 2013-09-02 MED ORDER — ONDANSETRON HCL 4 MG/2ML IJ SOLN
INTRAMUSCULAR | Status: DC | PRN
  Administered 2013-09-02: 4 mg via INTRAVENOUS

## 2013-09-02 MED ORDER — NITROGLYCERIN 0.4 MG SL SUBL: 0.4000 mg | SUBLINGUAL_TABLET | SUBLINGUAL | Status: DC | PRN

## 2013-09-02 MED ORDER — SODIUM CHLORIDE 0.9 % IJ SOLN
INTRAMUSCULAR | Status: AC
  Filled 2013-09-02: qty 10

## 2013-09-02 MED ORDER — ATORVASTATIN CALCIUM 80 MG PO TABS
80.0000 mg | ORAL_TABLET | Freq: Every day | ORAL | Status: DC
  Administered 2013-09-02: 80 mg via ORAL
  Filled 2013-09-02 (×2): qty 1

## 2013-09-02 MED ORDER — PHENAZOPYRIDINE HCL 200 MG PO TABS
200.0000 mg | ORAL_TABLET | Freq: Once | ORAL | Status: AC
  Administered 2013-09-02: 200 mg via ORAL
  Filled 2013-09-02: qty 1

## 2013-09-02 MED ORDER — HYDROMORPHONE HCL PF 1 MG/ML IJ SOLN: 0.2500 mg | INTRAMUSCULAR | Status: DC | PRN

## 2013-09-02 MED ORDER — PROMETHAZINE HCL 25 MG/ML IJ SOLN
6.2500 mg | Freq: Four times a day (QID) | INTRAMUSCULAR | Status: DC | PRN
  Administered 2013-09-02: 6.25 mg via INTRAVENOUS
  Filled 2013-09-02: qty 1

## 2013-09-02 MED ORDER — MIDAZOLAM HCL 2 MG/2ML IJ SOLN
INTRAMUSCULAR | Status: AC
  Filled 2013-09-02: qty 2

## 2013-09-02 MED ORDER — PROPOFOL 10 MG/ML IV BOLUS
INTRAVENOUS | Status: AC
  Filled 2013-09-02: qty 20

## 2013-09-02 MED ORDER — OXYCODONE-ACETAMINOPHEN 5-325 MG PO TABS: 1.0000 | ORAL_TABLET | ORAL | Status: DC | PRN

## 2013-09-02 MED ORDER — ONDANSETRON HCL 4 MG/2ML IJ SOLN
4.0000 mg | INTRAMUSCULAR | Status: DC | PRN
  Administered 2013-09-02: 4 mg via INTRAVENOUS
  Filled 2013-09-02: qty 2

## 2013-09-02 MED ORDER — FENTANYL CITRATE 0.05 MG/ML IJ SOLN
INTRAMUSCULAR | Status: DC | PRN
  Administered 2013-09-02 (×2): 50 ug via INTRAVENOUS

## 2013-09-02 MED ORDER — IOHEXOL 350 MG/ML SOLN
INTRAVENOUS | Status: DC | PRN
  Administered 2013-09-02: 10 mL via URETHRAL

## 2013-09-02 MED ORDER — OXYCODONE-ACETAMINOPHEN 5-325 MG PO TABS
1.0000 | ORAL_TABLET | Freq: Four times a day (QID) | ORAL | Status: DC | PRN
Start: 2013-09-02 — End: 2013-09-03

## 2013-09-02 MED ORDER — SODIUM CHLORIDE 0.9 % IR SOLN
Status: DC | PRN
  Administered 2013-09-02: 3000 mL

## 2013-09-02 MED ORDER — OXYCODONE HCL 5 MG PO TABS
5.0000 mg | ORAL_TABLET | Freq: Once | ORAL | Status: AC
  Administered 2013-09-02: 5 mg via ORAL
  Filled 2013-09-02: qty 1

## 2013-09-02 MED ORDER — EPHEDRINE SULFATE 50 MG/ML IJ SOLN
INTRAMUSCULAR | Status: DC | PRN
  Administered 2013-09-02 (×2): 10 mg via INTRAVENOUS

## 2013-09-02 MED ORDER — PROMETHAZINE HCL 25 MG/ML IJ SOLN: 12.5000 mg | Freq: Four times a day (QID) | INTRAMUSCULAR | Status: DC | PRN

## 2013-09-02 MED ORDER — LIDOCAINE HCL (CARDIAC) 20 MG/ML IV SOLN
INTRAVENOUS | Status: DC | PRN
  Administered 2013-09-02: 100 mg via INTRAVENOUS

## 2013-09-02 MED ORDER — PROMETHAZINE HCL 25 MG/ML IJ SOLN
6.2500 mg | INTRAMUSCULAR | Status: AC | PRN
  Administered 2013-09-02 (×2): 6.25 mg via INTRAVENOUS
  Filled 2013-09-02: qty 1

## 2013-09-02 MED ORDER — CEFAZOLIN SODIUM-DEXTROSE 2-3 GM-% IV SOLR
INTRAVENOUS | Status: AC
  Filled 2013-09-02: qty 50

## 2013-09-02 MED ORDER — LIDOCAINE HCL 2 % EX GEL
CUTANEOUS | Status: DC | PRN
  Administered 2013-09-02: 1

## 2013-09-02 MED ORDER — EPHEDRINE SULFATE 50 MG/ML IJ SOLN
INTRAMUSCULAR | Status: AC
  Filled 2013-09-02: qty 1

## 2013-09-02 MED ORDER — FENTANYL CITRATE 0.05 MG/ML IJ SOLN
INTRAMUSCULAR | Status: AC
  Filled 2013-09-02: qty 2

## 2013-09-02 MED ORDER — PANTOPRAZOLE SODIUM 40 MG IV SOLR
40.0000 mg | Freq: Every day | INTRAVENOUS | Status: DC
  Administered 2013-09-02: 40 mg via INTRAVENOUS
  Filled 2013-09-02 (×2): qty 40

## 2013-09-02 MED ORDER — MIDAZOLAM HCL 5 MG/5ML IJ SOLN
INTRAMUSCULAR | Status: DC | PRN
  Administered 2013-09-02: 2 mg via INTRAVENOUS

## 2013-09-02 MED ORDER — OXYCODONE HCL 5 MG PO TABS
5.0000 mg | ORAL_TABLET | Freq: Once | ORAL | Status: AC | PRN
  Administered 2013-09-02: 5 mg via ORAL
  Filled 2013-09-02: qty 1

## 2013-09-02 MED ORDER — RIVAROXABAN 20 MG PO TABS: 20.0000 mg | ORAL_TABLET | Freq: Every day | ORAL | Status: DC

## 2013-09-02 MED ORDER — LIDOCAINE HCL (CARDIAC) 20 MG/ML IV SOLN
INTRAVENOUS | Status: AC
  Filled 2013-09-02: qty 5

## 2013-09-02 MED ORDER — DIAZEPAM 5 MG/ML IJ SOLN
2.5000 mg | Freq: Once | INTRAMUSCULAR | Status: AC
  Administered 2013-09-02: 2.5 mg via INTRAVENOUS
  Filled 2013-09-02: qty 2

## 2013-09-02 MED ORDER — MEPERIDINE HCL 50 MG/ML IJ SOLN: 6.2500 mg | INTRAMUSCULAR | Status: DC | PRN

## 2013-09-02 MED ORDER — KETOROLAC TROMETHAMINE 30 MG/ML IJ SOLN
30.0000 mg | Freq: Four times a day (QID) | INTRAMUSCULAR | Status: DC | PRN
  Filled 2013-09-02 (×2): qty 1

## 2013-09-02 MED ORDER — SODIUM CHLORIDE 0.9 % IV SOLN
INTRAVENOUS | Status: DC
  Administered 2013-09-02 – 2013-09-03 (×2): via INTRAVENOUS

## 2013-09-02 MED ORDER — ONDANSETRON HCL 4 MG/2ML IJ SOLN
INTRAMUSCULAR | Status: AC
  Filled 2013-09-02: qty 2

## 2013-09-02 MED ORDER — LACTATED RINGERS IV SOLN
INTRAVENOUS | Status: DC
  Administered 2013-09-02: 1000 mL via INTRAVENOUS
  Administered 2013-09-02: 14:00:00 via INTRAVENOUS

## 2013-09-02 MED ORDER — OXYCODONE HCL 5 MG/5ML PO SOLN
5.0000 mg | Freq: Once | ORAL | Status: AC | PRN
  Filled 2013-09-02: qty 5

## 2013-09-02 MED ORDER — KETOROLAC TROMETHAMINE 30 MG/ML IJ SOLN
30.0000 mg | Freq: Once | INTRAMUSCULAR | Status: AC
  Administered 2013-09-02: 30 mg via INTRAVENOUS
  Filled 2013-09-02: qty 1

## 2013-09-02 MED ORDER — PROPOFOL 10 MG/ML IV BOLUS
INTRAVENOUS | Status: DC | PRN
  Administered 2013-09-02: 200 mg via INTRAVENOUS

## 2013-09-02 MED ORDER — LIDOCAINE HCL 2 % EX GEL
CUTANEOUS | Status: AC
  Filled 2013-09-02: qty 10

## 2013-09-02 MED ORDER — CLOPIDOGREL BISULFATE 75 MG PO TABS
75.0000 mg | ORAL_TABLET | Freq: Every day | ORAL | Status: DC
  Filled 2013-09-02: qty 1

## 2013-09-02 MED ORDER — OXYBUTYNIN CHLORIDE 5 MG PO TABS
5.0000 mg | ORAL_TABLET | Freq: Three times a day (TID) | ORAL | Status: DC | PRN
  Filled 2013-09-02: qty 1

## 2013-09-02 MED ORDER — HYDROMORPHONE HCL PF 1 MG/ML IJ SOLN
0.5000 mg | INTRAMUSCULAR | Status: DC | PRN
  Administered 2013-09-02: 1 mg via INTRAVENOUS
  Filled 2013-09-02: qty 1

## 2013-09-02 MED ORDER — METOPROLOL SUCCINATE ER 25 MG PO TB24
25.0000 mg | ORAL_TABLET | Freq: Every morning | ORAL | Status: DC
  Filled 2013-09-02: qty 1

## 2013-09-02 MED ORDER — MORPHINE SULFATE 2 MG/ML IJ SOLN
2.0000 mg | INTRAMUSCULAR | Status: DC | PRN
  Administered 2013-09-02: 2 mg via INTRAVENOUS
  Filled 2013-09-02: qty 1

## 2013-09-02 MED ORDER — CEFAZOLIN SODIUM-DEXTROSE 2-3 GM-% IV SOLR
2.0000 g | INTRAVENOUS | Status: AC
  Administered 2013-09-02: 2 g via INTRAVENOUS

## 2013-09-02 SURGICAL SUPPLY — 25 items
BAG URO CATCHER STRL LF (DRAPE) ×4 IMPLANT
BASKET LASER NITINOL 1.9FR (BASKET) IMPLANT
BASKET STNLS GEMINI 4WIRE 3FR (BASKET) IMPLANT
BASKET ZERO TIP NITINOL 2.4FR (BASKET) IMPLANT
BRUSH URET BIOPSY 3F (UROLOGICAL SUPPLIES) IMPLANT
CATH INTERMIT  6FR 70CM (CATHETERS) IMPLANT
CATH URET DUAL LUMEN 6-10FR 50 (CATHETERS) ×4 IMPLANT
CLOTH BEACON ORANGE TIMEOUT ST (SAFETY) ×4 IMPLANT
DRAPE CAMERA CLOSED 9X96 (DRAPES) ×4 IMPLANT
GLOVE BIOGEL M STRL SZ7.5 (GLOVE) ×8 IMPLANT
GOWN STRL REUS W/TWL XL LVL3 (GOWN DISPOSABLE) ×4 IMPLANT
GUIDEWIRE ANG ZIPWIRE 038X150 (WIRE) ×4 IMPLANT
GUIDEWIRE STR DUAL SENSOR (WIRE) IMPLANT
IV NS IRRIG 3000ML ARTHROMATIC (IV SOLUTION) ×8 IMPLANT
KIT BALLIN UROMAX 15FX10 (LABEL) IMPLANT
KIT BALLN UROMAX 15FX4 (MISCELLANEOUS) IMPLANT
KIT BALLN UROMAX 26 75X4 (MISCELLANEOUS)
PACK CYSTO (CUSTOM PROCEDURE TRAY) ×8 IMPLANT
SET HIGH PRES BAL DIL (LABEL)
SET IRRIG Y TYPE TUR BLADDER L (SET/KITS/TRAYS/PACK) ×4 IMPLANT
SHEATH ACCESS URETERAL 38CM (SHEATH) ×4 IMPLANT
SHEATH URET ACCESS 12FR/35CM (UROLOGICAL SUPPLIES) IMPLANT
SHEATH URET ACCESS 12FR/55CM (UROLOGICAL SUPPLIES) IMPLANT
STENT CONTOUR 6FRX26X.038 (STENTS) ×4 IMPLANT
SYRINGE IRR TOOMEY STRL 70CC (SYRINGE) IMPLANT

## 2013-09-02 NOTE — Interval H&P Note (Signed)
History and Physical Interval Note:  09/02/2013 11:04 AM  Paul Todd  has presented today for surgery, with the diagnosis of LEFT HYDRONEPHROSIS/GROSS HEMITERIA/LEFT RENAL STONE    The various methods of treatment have been discussed with the patient and family. After consideration of risks, benefits and other options for treatment, the patient has consented to  Procedure(s): CYSTOSCOPY WITH RETROGRADE PYELOGRAM/LEFT URETEROSCOPY/URETERAL STENT  (Left) HOLMIUM LASER LITHROTRIPSY  (N/A) as a surgical intervention .  The patient's history has been reviewed, patient examined, no change in status, stable for surgery.  I have reviewed the patient's chart and labs. We discussed again possibility of staged procedure and the patient the nature, potential benefits, risks and alternatives to the above, including side effects of the proposed treatment, the likelihood of the patient achieving the goals of the procedure, and any potential problems that might occur during the procedure or recuperation. All questions answered. Patient elects to proceed. He's had no fever, dysuria, CP or SOB. Has chronic back pain. Hematuria resolved after stopping xarelto.     Festus Aloe

## 2013-09-02 NOTE — Anesthesia Preprocedure Evaluation (Addendum)
Anesthesia Evaluation  Patient identified by MRN, date of birth, ID band Patient awake    Reviewed: Allergy & Precautions, H&P , NPO status , Patient's Chart, lab work & pertinent test results, reviewed documented beta blocker date and time   Airway Mallampati: III TM Distance: <3 FB Neck ROM: Full    Dental  (+) Dental Advisory Given   Pulmonary sleep apnea and Continuous Positive Airway Pressure Ventilation , Current Smoker, former smoker,  breath sounds clear to auscultation        Cardiovascular hypertension, - angina+ CAD and + Past MI + dysrhythmias Rhythm:Regular Rate:Normal     Neuro/Psych negative neurological ROS  negative psych ROS   GI/Hepatic negative GI ROS, Neg liver ROS,   Endo/Other  negative endocrine ROS  Renal/GU negative Renal ROS     Musculoskeletal negative musculoskeletal ROS (+)   Abdominal (+) + obese,   Peds  Hematology negative hematology ROS (+)   Anesthesia Other Findings   Reproductive/Obstetrics negative OB ROS                          Anesthesia Physical  Anesthesia Plan  ASA: III  Anesthesia Plan: General   Post-op Pain Management:    Induction: Intravenous  Airway Management Planned: LMA  Additional Equipment:   Intra-op Plan:   Post-operative Plan: Extubation in OR  Informed Consent: I have reviewed the patients History and Physical, chart, labs and discussed the procedure including the risks, benefits and alternatives for the proposed anesthesia with the patient or authorized representative who has indicated his/her understanding and acceptance.   Dental advisory given  Plan Discussed with: CRNA  Anesthesia Plan Comments:         Anesthesia Quick Evaluation

## 2013-09-02 NOTE — Progress Notes (Addendum)
No prescription for pain medications for home use on chart post op for cystoscopy with stent. Patient describing symptoms of bladder spasms (feeling pressure like he has to urinate real bad). Paged Dr Junious Silk. Telephone order received for pyridium. He will have pain prescriptions at front desk of [facility] office.  1520   Paged Dr Junious Silk for additional orders as pain and nausea have not improved.  67   Paged Dr Junious Silk with patient's continued nausea and discomfort. He orders valium IV for bladder spasms. Will call back for update on his progress.  1820  Report called to 4W RN taking care of 1445. Patient transferred by wheelchair to 1445 with wife in [place]. Wife has received prescriptions for home and filled them at out patient pharmacy while patient was in short stay.

## 2013-09-02 NOTE — Progress Notes (Signed)
Pt states did have sleep study but has not received "C-PAP" machine yet

## 2013-09-02 NOTE — Op Note (Signed)
Preoperative diagnosis: Gross hematuria, left renal stones Postoperative diagnosis: Gross hematuria, bladder neoplasm, left renal stones  Procedure: Exam under anesthesia Cystoscopy Bladder biopsy fulguration Bilateral retrograde pyelograms Left ureteral stent placement  Surgeon: Junious Silk  Anesthesia: [place]  Type of anesthesia: Gen.  Findings: On exam under anesthesia the penis was  normal without mass or lesion. Testicles were descended bilaterally and palpably normal. on cystoscopy the urethra was normal, the prostatic urethra appeared normal, the trigone ureteral orifices were in their normal orthotopic position with clear efflux bilaterally. There was some erythematous and almost raised mucosa just lateral and superior to the right ureteral orifice. This was biopsied and the area fulgurated. Just above the left ureteral orifice appeared to be a small hemangioma. The remainder the bladder mucosa appeared normal. There were no stones or foreign bodies in the bladder. There was no trabeculation.  Right retrograde pyelogram was normal. Interpretation as follows: Right retrograde pyelogram-this outlined a single ureter single collecting system unit without filling defect, dilation or stricture.  Left retrograde pyelogram showed a large filling defect in the left renal pelvis consistent with left renal stone was otherwise normal. Left retrograde pyelogram interpretation as follows: Left retrograde pyelogram-this outlined a single ureter single collecting system unit. The ureter was normal without filling defect dilation or stricture. At the left UPJ there was a large round filling defect consistent with the known stone. The remainder of the collecting system appeared normal without significant dilation or other filling defects. Contrast appeared to drain briskly.  Description of procedure: After consent was obtained patient brought to the operating room. After adequate anesthesia he was  placed in lithotomy position and prepped and draped in the usual sterile fashion. A timeout was performed to confirm the patient and procedure. An exam under anesthesia was performed. The cystoscope was passed per urethra and the bladder carefully examined with a 12 and 70 lens.    The right ureteral orifice was cannulated with a 6 [place] open-ended catheter and retrograde injection of contrast was performed. The left ureteral orifice was cannulated with a 6 [place] open-ended catheter and retrograde injection of contrast was performed.  The erythematous mucosa but the right ureteral orifice was biopsied and the area fulgurated. A small hemangioma above the left ureteral orifice was fulgurated.  Next a sensor wire was advanced up the left ureter. The bladder was drained and the scope removed leaving the wire in place. Over the wire a dual-lumen exchange catheter was passed but the intramural ureter on the left was very tight and a dual-lumen only passed just above the iliacs. I advanced a second wire and removed the dual lumen. There was quite tight. Over the second wire and pass the inner cannula of the access sheath which would not pass. I tried to give the entire access sheath a trial of advancement but it would not access the ureter. Given his continued Plavix use because of the recent heart stent I decided as we have discussed preoperatively to perform a staged procedure and allow the ureter dilate passively. Therefore one of the wires was removed and the other was backloaded on the cystoscope. A 626 cm stent was advanced. The wire was removed and a good coil was seen in the kidney and a good coil in the bladder. The bladder was drained and the scope removed. Patient was awakened and taken to the recovery room in stable condition.  Complications: None Blood loss: Minimal  Specimens: #1 right bladder biopsy  - to pathology  Drains:  6 x 26 cm left ureteral stent  Disposition: Patient stable to  PACU

## 2013-09-02 NOTE — Transfer of Care (Signed)
Immediate Anesthesia Transfer of Care Note  Patient: Paul Todd  Procedure(s) Performed: Procedure(s) (LRB): CYSTOSCOPY WITH RETROGRADE PYELOGRAM/LEFT URETEROSCOPY/URETERAL STENT, with fulguration (Left)  Patient Location: PACU  Anesthesia Type: General  Level of Consciousness: sedated, patient cooperative and responds to stimulation  Airway & Oxygen Therapy: Patient Spontanous Breathing and Patient connected to face mask oxgen  Post-op Assessment: Report given to PACU RN and Post -op Vital signs reviewed and stable  Post vital signs: Reviewed and stable  Complications: No apparent anesthesia complications

## 2013-09-02 NOTE — Anesthesia Postprocedure Evaluation (Signed)
  Anesthesia Post-op Note  Patient: Paul Todd  Procedure(s) Performed: Procedure(s) (LRB): CYSTOSCOPY WITH RETROGRADE PYELOGRAM/LEFT URETEROSCOPY/URETERAL STENT, with fulguration (Left)  Patient Location: PACU  Anesthesia Type: General  Level of Consciousness: awake and alert   Airway and Oxygen Therapy: Patient Spontanous Breathing  Post-op Pain: mild  Post-op Assessment: Post-op Vital signs reviewed, Patient's Cardiovascular Status Stable, Respiratory Function Stable, Patent Airway and No signs of Nausea or vomiting  Last Vitals:  Filed Vitals:   09/02/13 1345  BP: 147/76  Pulse: 51  Temp: 36.4 C  Resp:     Post-op Vital Signs: stable   Complications: No apparent anesthesia complications

## 2013-09-02 NOTE — Discharge Instructions (Signed)
Ureteral Stent Implantation, Care After Refer to this sheet in the next few weeks. These instructions provide you with information on caring for yourself after your procedure. Your health care provider may also give you more specific instructions. Your treatment has been planned according to current medical practices, but problems sometimes occur. Call your health care provider if you have any problems or questions after your procedure. WHAT TO EXPECT AFTER THE PROCEDURE You should be back to normal activity within 48 hours after the procedure. Nausea and vomiting may occur and are commonly the result of anesthesia. It is common to experience sharp pain in the back or lower abdomen and penis with voiding. This is caused by movement of the ends of the stent with the act of urinating.It usually goes away within minutes after you have stopped urinating. HOME CARE INSTRUCTIONS Make sure to drink plenty of fluids. You may have small amounts of bleeding, causing your urine to be red. This is normal. Certain movements may trigger pain or a feeling that you need to urinate. You may be given medicines to prevent infection or bladder spasms. Be sure to take all medicines as directed. Only take over-the-counter or prescription medicines for pain, discomfort, or fever as directed by your health care provider. Do not take aspirin, as this can make bleeding worse.  Your stent will be left in until the stones are removed. We will plan to attempt ureteroscopy (look with a camera up into the kidney) again in 2-3 weeks. The office will call.   The stent in the ureter is temporary. It must be changed or removed. Be sure to keep all follow-up appointments so your health care provider can check that you are healing properly.  SEEK MEDICAL CARE IF:  You experience increasing pain.  Your pain medicine is not working. SEEK IMMEDIATE MEDICAL CARE IF:  Your urine is dark red or has blood clots.  You are leaking urine  (incontinent).  You have a fever, chills, feeling sick to your stomach (nausea), or vomiting.  Your pain is not relieved by pain medicine.  The end of the stent comes out of the urethra.  You are unable to urinate.  Cystoscopy, Care After Refer to this sheet in the next few weeks. These instructions provide you with information on caring for yourself after your procedure. Your caregiver may also give you more specific instructions. Your treatment has been planned according to current medical practices, but problems sometimes occur. Call your caregiver if you have any problems or questions after your procedure. HOME CARE INSTRUCTIONS  Things you can do to ease any discomfort after your procedure include:  Drinking enough water and fluids to keep your urine clear or pale yellow.  Taking a warm bath to relieve any burning feelings. SEEK IMMEDIATE MEDICAL CARE IF:   You have an increase in blood in your urine.  You notice blood clots in your urine.  You have difficulty passing urine.  You have the chills.  You have abdominal pain.  You have a fever or persistent symptoms for more than 2-3 days.  You have a fever and your symptoms suddenly get worse. MAKE SURE YOU:   Understand these instructions.  Will watch your condition.  Will get help right away if you are not doing well or get worse.   General Anesthesia, Care After Refer to this sheet in the next few weeks. These instructions provide you with information on caring for yourself after your procedure. Your health care provider  may also give you more specific instructions. Your treatment has been planned according to current medical practices, but problems sometimes occur. Call your health care provider if you have any problems or questions after your procedure. WHAT TO EXPECT AFTER THE PROCEDURE After the procedure, it is typical to experience:  Sleepiness.  Nausea and vomiting. HOME CARE INSTRUCTIONS  For the first  24 hours after general anesthesia:  Have a responsible person with you.  Do not drive a car. If you are alone, do not take public transportation.  Do not drink alcohol.  Do not take medicine that has not been prescribed by your health care provider.  Do not sign important papers or make important decisions.  You may resume a normal diet and activities as directed by your health care provider.  Change bandages (dressings) as directed.  If you have questions or problems that seem related to general anesthesia, call the hospital and ask for the anesthetist or anesthesiologist on call. SEEK MEDICAL CARE IF:  You have nausea and vomiting that continue the day after anesthesia.  You develop a rash. SEEK IMMEDIATE MEDICAL CARE IF:   You have difficulty breathing.  You have chest pain.  You have any allergic problems. Document Released: 03/31/2000 Document Revised: 12/28/2012 Document Reviewed: 07/08/2012 [facility] Patient Information 2015 [place], [place]. This information is not intended to replace advice given to you by your health care provider. Make sure you discuss any questions you have with your health care provider.

## 2013-09-02 NOTE — Progress Notes (Signed)
Patient ID: Paul Todd, male   DOB: Jun 01, 1960, 53 y.o.   MRN: [id number]  Patient develops nausea, left lower quadrant and left flank pain when he moves. He's fine when he is still. He's had 2 small volume emesis this afternoon because of nausea and has not taken anything by mouth.  He has not voided.  Bladder scan 78 mL.  He has no chest pain or shortness of breath.  Preoperatively he was on quite a bit of pain medicine as well as Phenergan.  We had a difficult time getting his pain and nausea under control postop.  Also he hasn't taken anything by mouth.  His wife says he has been resting comfortably and "snoring normally".   Filed Vitals:   09/02/13 1659  BP: 140/71  Pulse: 53  Temp: 97.7 F (36.5 C)  Resp: 14   Physical exam: Patient is in no acute distress sitting in chair Cardiovascular: Regular rate and rhythm Respiration: Regular effort, rate in depth Abdomen: Soft and nontender, no left lower quadrant pain, bladder not distended.  Extremities: No calf pain or lower extremity edema on palpation   Assessment/plan: 1-gross hematuria -  status post cystoscopy, retrogrades, bladder biopsy fulguration 2-left renal stones - status post left ureteral stent - Plan ureteroscopy in 2-3 weeks.  Office is scheduling/Green sheet submitted.  3-postoperative nausea, vomiting, stent pain -  He's not been able to take by mouth this afternoon because of nausea and vomiting. patient may be dehydrated as he has not voided and his bladder is empty.  Therefore will admit overnight observation for IV fluids, nausea and pain control.  I ordered IV Protonix followed by ketorolac then narcotics for pain.  I also ordered oxybutynin.  Hopefully things will settle down by the morning.

## 2013-09-03 DIAGNOSIS — R310 Gross hematuria: Secondary | ICD-10-CM | POA: Diagnosis not present

## 2013-09-03 NOTE — Progress Notes (Signed)
UR completed 

## 2013-09-03 NOTE — Discharge Summary (Signed)
Physician Discharge Summary  Patient ID: Paul Todd MRN: [id number] DOB/AGE: April 06, 1960 53 y.o.  Admit date: 09/02/2013 Discharge date: 09/03/2013  Admission Diagnoses: 1. Left renal calculi 2. Bladder lesion 3. Emesis  Discharge Diagnoses:  Same Discharged Condition: good  Hospital Course: He underwent left double-J stent placement and biopsy of bladder lesion. Postoperatively he was having bladder spasms and emesis. Because his emesis could not be controlled adequately admitted for overnight observation and has improved.    Discharge Exam: Blood pressure 129/67, pulse 58, temperature 98.1 F (36.7 C), temperature source Oral, resp. rate 20, height 5\' 7"  (1.702 m), weight 103.448 kg (228 lb 1 oz), SpO2 96.00%. His abdomen is soft and nontender. No CVAT.  Disposition: 01-Home or Self Care  Discharge Instructions   Discharge patient    Complete by:  As directed             Medication List         acetaminophen 650 MG CR tablet  Commonly known as:  TYLENOL  Take 650 mg by mouth every 8 (eight) hours as needed for pain.     atorvastatin 80 MG tablet  Commonly known as:  LIPITOR  Take 80 mg by mouth daily.     clopidogrel 75 MG tablet  Commonly known as:  PLAVIX  Take 75 mg by mouth daily.     metoprolol succinate 25 MG 24 hr tablet  Commonly known as:  TOPROL-XL  Take 25 mg by mouth every morning.     nitroGLYCERIN 0.4 MG SL tablet  Commonly known as:  NITROSTAT  Place 0.4 mg under the tongue every 5 (five) minutes as needed for chest pain.     Oxycodone HCl 10 MG Tabs  Take 10 mg by mouth every 8 (eight) hours as needed (pain).     oxyCODONE-acetaminophen 5-325 MG per tablet  Commonly known as:  PERCOCET/ROXICET  Take 1 tablet by mouth every 6 (six) hours as needed for severe pain.     pantoprazole 40 MG tablet  Commonly known as:  PROTONIX  Take 40 mg by mouth daily.     promethazine 25 MG tablet  Commonly known as:  PHENERGAN  Take 1 tablet  (25 mg total) by mouth every 8 (eight) hours as needed for nausea or vomiting.     rivaroxaban 20 MG Tabs tablet  Commonly known as:  XARELTO  Take 1 tablet (20 mg total) by mouth daily with supper.  Start taking on:  09/04/2013           Follow-up Information   Follow up with Festus Aloe, MD. (Office will call with appointment - plan for return to OR in 2-3 weeks)    Specialty:  Urology   Contact information:   [address] [place] [address] [phone]       Signed: Claybon Jabs 09/03/2013, 6:23 AM

## 2013-09-05 ENCOUNTER — Encounter (HOSPITAL_COMMUNITY): Payer: Self-pay | Admitting: Emergency Medicine

## 2013-09-05 ENCOUNTER — Inpatient Hospital Stay (HOSPITAL_COMMUNITY)
Admission: EM | Admit: 2013-09-05 | Discharge: 2013-09-06 | DRG: 313 | Disposition: A | Discharge status: Home / Self Care | Payer: Medicaid Other | Attending: Internal Medicine | Admitting: Internal Medicine

## 2013-09-05 ENCOUNTER — Emergency Department (HOSPITAL_COMMUNITY): Payer: Medicaid Other

## 2013-09-05 DIAGNOSIS — N1330 Unspecified hydronephrosis: Secondary | ICD-10-CM

## 2013-09-05 DIAGNOSIS — Z6834 Body mass index (BMI) 34.0-34.9, adult: Secondary | ICD-10-CM | POA: Diagnosis not present

## 2013-09-05 DIAGNOSIS — Z9861 Coronary angioplasty status: Secondary | ICD-10-CM | POA: Diagnosis not present

## 2013-09-05 DIAGNOSIS — I10 Essential (primary) hypertension: Secondary | ICD-10-CM | POA: Diagnosis present

## 2013-09-05 DIAGNOSIS — Z7902 Long term (current) use of antithrombotics/antiplatelets: Secondary | ICD-10-CM | POA: Diagnosis not present

## 2013-09-05 DIAGNOSIS — R0789 Other chest pain: Secondary | ICD-10-CM | POA: Diagnosis present

## 2013-09-05 DIAGNOSIS — N200 Calculus of kidney: Secondary | ICD-10-CM

## 2013-09-05 DIAGNOSIS — R079 Chest pain, unspecified: Secondary | ICD-10-CM | POA: Diagnosis not present

## 2013-09-05 DIAGNOSIS — R310 Gross hematuria: Secondary | ICD-10-CM

## 2013-09-05 DIAGNOSIS — Z87891 Personal history of nicotine dependence: Secondary | ICD-10-CM

## 2013-09-05 DIAGNOSIS — E785 Hyperlipidemia, unspecified: Secondary | ICD-10-CM

## 2013-09-05 DIAGNOSIS — I4891 Unspecified atrial fibrillation: Secondary | ICD-10-CM

## 2013-09-05 DIAGNOSIS — I209 Angina pectoris, unspecified: Secondary | ICD-10-CM

## 2013-09-05 DIAGNOSIS — E669 Obesity, unspecified: Secondary | ICD-10-CM | POA: Diagnosis present

## 2013-09-05 DIAGNOSIS — Z79899 Other long term (current) drug therapy: Secondary | ICD-10-CM

## 2013-09-05 DIAGNOSIS — I252 Old myocardial infarction: Secondary | ICD-10-CM | POA: Diagnosis present

## 2013-09-05 DIAGNOSIS — I480 Paroxysmal atrial fibrillation: Secondary | ICD-10-CM

## 2013-09-05 DIAGNOSIS — R112 Nausea with vomiting, unspecified: Secondary | ICD-10-CM | POA: Diagnosis present

## 2013-09-05 DIAGNOSIS — N201 Calculus of ureter: Secondary | ICD-10-CM

## 2013-09-05 DIAGNOSIS — Z7901 Long term (current) use of anticoagulants: Secondary | ICD-10-CM | POA: Diagnosis not present

## 2013-09-05 DIAGNOSIS — I2589 Other forms of chronic ischemic heart disease: Secondary | ICD-10-CM | POA: Diagnosis present

## 2013-09-05 DIAGNOSIS — I2510 Atherosclerotic heart disease of native coronary artery without angina pectoris: Secondary | ICD-10-CM | POA: Diagnosis present

## 2013-09-05 LAB — CBC
HCT: 41.1 % (ref 39.0–52.0)
Hemoglobin: 14.2 g/dL (ref 13.0–17.0)
MCH: 30.8 pg (ref 26.0–34.0)
MCHC: 34.5 g/dL (ref 30.0–36.0)
MCV: 89.2 fL (ref 78.0–100.0)
PLATELETS: 187 10*3/uL (ref 150–400)
RBC: 4.61 MIL/uL (ref 4.22–5.81)
RDW: 12.9 % (ref 11.5–15.5)
WBC: 14.1 10*3/uL — AB (ref 4.0–10.5)

## 2013-09-05 LAB — BASIC METABOLIC PANEL
ANION GAP: 15 (ref 5–15)
BUN: 15 mg/dL (ref 6–23)
CHLORIDE: 101 meq/L (ref 96–112)
CO2: 23 meq/L (ref 19–32)
Calcium: 9.2 mg/dL (ref 8.4–10.5)
Creatinine, Ser: 0.79 mg/dL (ref 0.50–1.35)
GFR calc Af Amer: >90 mL/min (ref >90)
GFR calc non Af Amer: >90 mL/min (ref >90)
Glucose, Bld: 137 mg/dL — ABNORMAL HIGH (ref 70–99)
POTASSIUM: 4 meq/L (ref 3.7–5.3)
Sodium: 139 mEq/L (ref 137–147)

## 2013-09-05 LAB — I-STAT TROPONIN, ED: TROPONIN I, POC: 0 ng/mL (ref 0.00–0.08)

## 2013-09-05 LAB — URINALYSIS, ROUTINE W REFLEX MICROSCOPIC
BILIRUBIN URINE: NEGATIVE
Glucose, UA: NEGATIVE mg/dL
Ketones, ur: 15 mg/dL — AB
Nitrite: NEGATIVE
PH: 8.5 — AB (ref 5.0–8.0)
Protein, ur: >300 mg/dL — AB
SPECIFIC GRAVITY, URINE: 1.019 (ref 1.005–1.030)
Urobilinogen, UA: 1 mg/dL (ref 0.0–1.0)

## 2013-09-05 LAB — TROPONIN I
Troponin I: <0.3 ng/mL (ref <0.30)
Troponin I: <0.3 ng/mL (ref <0.30)
Troponin I: <0.3 ng/mL (ref <0.30)

## 2013-09-05 LAB — LIPASE, BLOOD: Lipase: 23 U/L (ref 11–59)

## 2013-09-05 LAB — URINE MICROSCOPIC-ADD ON

## 2013-09-05 LAB — PROTIME-INR
INR: 1.21 (ref 0.00–1.49)
PROTHROMBIN TIME: 15.3 s — AB (ref 11.6–15.2)

## 2013-09-05 LAB — APTT: APTT: 30 s (ref 24–37)

## 2013-09-05 MED ORDER — ALBUTEROL SULFATE (2.5 MG/3ML) 0.083% IN NEBU: 2.5000 mg | INHALATION_SOLUTION | RESPIRATORY_TRACT | Status: DC | PRN

## 2013-09-05 MED ORDER — DEXTROSE 5 % IV SOLN
1.0000 g | INTRAVENOUS | Status: DC
  Administered 2013-09-05: 1 g via INTRAVENOUS
  Filled 2013-09-05 (×2): qty 10

## 2013-09-05 MED ORDER — ONDANSETRON HCL 4 MG/2ML IJ SOLN
4.0000 mg | Freq: Once | INTRAMUSCULAR | Status: AC
  Administered 2013-09-05: 4 mg via INTRAVENOUS
  Filled 2013-09-05: qty 2

## 2013-09-05 MED ORDER — FENTANYL CITRATE 0.05 MG/ML IJ SOLN
50.0000 ug | Freq: Once | INTRAMUSCULAR | Status: AC
  Administered 2013-09-05: 50 ug via INTRAVENOUS
  Filled 2013-09-05: qty 2

## 2013-09-05 MED ORDER — NITROGLYCERIN 0.4 MG SL SUBL
0.4000 mg | SUBLINGUAL_TABLET | SUBLINGUAL | Status: DC | PRN
  Administered 2013-09-05 (×3): 0.4 mg via SUBLINGUAL
  Filled 2013-09-05: qty 1

## 2013-09-05 MED ORDER — ACETAMINOPHEN 650 MG RE SUPP: 650.0000 mg | Freq: Four times a day (QID) | RECTAL | Status: DC | PRN

## 2013-09-05 MED ORDER — SODIUM CHLORIDE 0.9 % IV SOLN
Freq: Once | INTRAVENOUS | Status: AC
  Administered 2013-09-05: 08:00:00 via INTRAVENOUS

## 2013-09-05 MED ORDER — FAMOTIDINE IN NACL 20-0.9 MG/50ML-% IV SOLN
20.0000 mg | Freq: Once | INTRAVENOUS | Status: AC
  Administered 2013-09-05: 20 mg via INTRAVENOUS
  Filled 2013-09-05: qty 50

## 2013-09-05 MED ORDER — IOHEXOL 300 MG/ML  SOLN
80.0000 mL | Freq: Once | INTRAMUSCULAR | Status: AC | PRN
  Administered 2013-09-05: 80 mL via INTRAVENOUS

## 2013-09-05 MED ORDER — MORPHINE SULFATE 2 MG/ML IJ SOLN
1.0000 mg | INTRAMUSCULAR | Status: DC | PRN
  Administered 2013-09-05 – 2013-09-06 (×3): 2 mg via INTRAVENOUS
  Filled 2013-09-05 (×3): qty 1

## 2013-09-05 MED ORDER — OXYCODONE-ACETAMINOPHEN 5-325 MG PO TABS
1.0000 | ORAL_TABLET | Freq: Four times a day (QID) | ORAL | Status: DC | PRN
  Administered 2013-09-06: 1 via ORAL
  Filled 2013-09-05: qty 1

## 2013-09-05 MED ORDER — ONDANSETRON HCL 4 MG/2ML IJ SOLN: 4.0000 mg | Freq: Four times a day (QID) | INTRAMUSCULAR | Status: DC | PRN

## 2013-09-05 MED ORDER — PANTOPRAZOLE SODIUM 40 MG PO TBEC
40.0000 mg | DELAYED_RELEASE_TABLET | Freq: Every day | ORAL | Status: DC
  Administered 2013-09-05 – 2013-09-06 (×2): 40 mg via ORAL
  Filled 2013-09-05 (×2): qty 1

## 2013-09-05 MED ORDER — SODIUM CHLORIDE 0.9 % IJ SOLN
3.0000 mL | Freq: Two times a day (BID) | INTRAMUSCULAR | Status: DC
Start: 2013-09-05 — End: 2013-09-06
  Administered 2013-09-06: 3 mL via INTRAVENOUS

## 2013-09-05 MED ORDER — METOPROLOL SUCCINATE ER 25 MG PO TB24
25.0000 mg | ORAL_TABLET | Freq: Every morning | ORAL | Status: DC
  Administered 2013-09-06: 25 mg via ORAL
  Filled 2013-09-05 (×2): qty 1

## 2013-09-05 MED ORDER — HYDROMORPHONE HCL PF 1 MG/ML IJ SOLN
1.0000 mg | Freq: Once | INTRAMUSCULAR | Status: AC
  Administered 2013-09-05: 1 mg via INTRAVENOUS
  Filled 2013-09-05: qty 1

## 2013-09-05 MED ORDER — SODIUM CHLORIDE 0.9 % IV SOLN
INTRAVENOUS | Status: AC
  Administered 2013-09-05: 16:00:00 via INTRAVENOUS

## 2013-09-05 MED ORDER — ONDANSETRON HCL 4 MG PO TABS: 4.0000 mg | ORAL_TABLET | Freq: Four times a day (QID) | ORAL | Status: DC | PRN

## 2013-09-05 MED ORDER — ATORVASTATIN CALCIUM 80 MG PO TABS
80.0000 mg | ORAL_TABLET | Freq: Every day | ORAL | Status: DC
  Administered 2013-09-05 – 2013-09-06 (×2): 80 mg via ORAL
  Filled 2013-09-05 (×2): qty 1

## 2013-09-05 MED ORDER — IOHEXOL 300 MG/ML  SOLN
25.0000 mL | INTRAMUSCULAR | Status: DC
  Administered 2013-09-05: 25 mL via ORAL

## 2013-09-05 MED ORDER — SODIUM CHLORIDE 0.9 % IV BOLUS (SEPSIS)
1000.0000 mL | Freq: Once | INTRAVENOUS | Status: AC
  Administered 2013-09-05: 1000 mL via INTRAVENOUS

## 2013-09-05 MED ORDER — ACETAMINOPHEN 325 MG PO TABS
650.0000 mg | ORAL_TABLET | Freq: Four times a day (QID) | ORAL | Status: DC | PRN
  Administered 2013-09-06: 650 mg via ORAL
  Filled 2013-09-05: qty 2

## 2013-09-05 MED ORDER — CLOPIDOGREL BISULFATE 75 MG PO TABS
75.0000 mg | ORAL_TABLET | Freq: Once | ORAL | Status: AC
  Administered 2013-09-05: 75 mg via ORAL
  Filled 2013-09-05: qty 1

## 2013-09-05 MED ORDER — CLOPIDOGREL BISULFATE 75 MG PO TABS
75.0000 mg | ORAL_TABLET | Freq: Every day | ORAL | Status: DC
  Administered 2013-09-05 – 2013-09-06 (×2): 75 mg via ORAL
  Filled 2013-09-05 (×2): qty 1

## 2013-09-05 NOTE — ED Notes (Signed)
Patient transported to X-ray without distress.  

## 2013-09-05 NOTE — ED Notes (Signed)
Pt returned from xray

## 2013-09-05 NOTE — ED Notes (Signed)
Pt restarting oral contrast per EDP.

## 2013-09-05 NOTE — ED Provider Notes (Addendum)
CSN: [id number]     Arrival date & time 09/05/13  0701 History   First MD Initiated Contact with Patient 09/05/13 [phone]     Chief Complaint  Patient presents with  . Chest Pain  . Abdominal Pain  . Hematuria     (Consider location/radiation/quality/duration/timing/severity/associated sxs/prior Treatment) HPI Comments: [name] is a 53 yo M with a history of prior MI in Feb 2015, afib on Xarelto, CAD, HTN who presents to the ED with hematuria, abdominal pain, and chest pain. Last Friday 8/28, [name] went in for a procedure for his kidney stones where a stent was placed.  Pt was doing well until yesterday evening, when he restarted his Xarelto (last dose 8/30 at 7 pm). About an hour after taking his Xarelto, he then started to have abdominal pain, nausea, and hematuria all at once and all of a sudden. The abdominal pain started suddenly and is described as a sharp ripping epigastric pain that is constant. Nothing makes the pain better or worse, and he has not had this pain before. He had nausea and vomiting, with blood in his emesis during later bouts of emesis. He also started passing large amounts of hematuria and has burning with urination. He continues to have pain from his kidney stones, but states that this is not worse than his baseline.  Last night, he started to have chest pain, which he describes as a heavy feeling in his chest that is similar to his MI. He says the pain comes and goes. He also has shortness of breath, sweating, and chills. On his way to the ED this morning, he took a nitroglycerin which improved the chest pain. At the moment, his biggest complaint is the abdominal pain.  Patient is a 53 y.o. male presenting with chest pain, abdominal pain, and hematuria. The history is provided by the patient.  Chest Pain Associated symptoms: abdominal pain, diaphoresis, nausea and vomiting   Associated symptoms: no cough, no dizziness, no fever, no headache and no  shortness of breath   Abdominal Pain Associated symptoms: chest pain, dysuria, hematuria, nausea and vomiting   Associated symptoms: no chills, no cough, no fever and no shortness of breath   Hematuria Associated symptoms include chest pain and abdominal pain. Pertinent negatives include no headaches and no shortness of breath.    Past Medical History  Diagnosis Date  . Nephrolithiasis   . PAF (paroxysmal atrial fibrillation)     during a kidney stone attack  . Dyslipidemia   . CAD (coronary artery disease)     multivessel per cath 02/12/13, s/p stenting to RCA  . Hypertension   . Dysrhythmia     hx of atrial fib   . Sleep apnea     has not received equip as of 08/23/13    . Arthritis   . Myocardial infarction     02/12/2013   . Nausea & vomiting   . Old myocardial infarction    Past Surgical History  Procedure Laterality Date  . Tee without cardioversion N/A 02/15/2013    Procedure: TRANSESOPHAGEAL ECHOCARDIOGRAM (TEE);  Surgeon: Thayer Headings, MD;  Location: [facility];  Service: Cardiovascular;  Laterality: N/A;  . Cardioversion N/A 02/15/2013    Procedure: CARDIOVERSION;  Surgeon: Thayer Headings, MD;  Location: [facility] ENDOSCOPY;  Service: Cardiovascular;  Laterality: N/A;  . Coronary angioplasty with stent placement  02/12/2013   Family History  Problem Relation Age of Onset  . Heart disease Mother   .  Diabetes Mother   . Diabetes Father    History  Substance Use Topics  . Smoking status: Former Smoker -- 0.50 packs/day for 35 years    Types: Cigarettes    Quit date: 02/12/2013  . Smokeless tobacco: Never Used  . Alcohol Use: Yes     Comment: rare     Review of Systems  Constitutional: Positive for diaphoresis and activity change. Negative for fever and chills.  Eyes: Negative for visual disturbance.  Respiratory: Negative for cough, chest tightness and shortness of breath.   Cardiovascular: Positive for chest pain.  Gastrointestinal: Positive for nausea, vomiting  and abdominal pain. Negative for abdominal distention.  Genitourinary: Positive for dysuria and hematuria. Negative for enuresis and difficulty urinating.  Musculoskeletal: Negative for arthralgias and neck pain.  Neurological: Negative for dizziness, light-headedness and headaches.  Psychiatric/Behavioral: Negative for confusion.      Allergies  Review of patient's allergies indicates no known allergies.  Home Medications   Prior to Admission medications   Medication Sig Start Date End Date Taking? Authorizing Provider  acetaminophen (TYLENOL) 650 MG CR tablet Take 650 mg by mouth every 8 (eight) hours as needed for pain.   Yes Historical Provider, MD  atorvastatin (LIPITOR) 80 MG tablet Take 80 mg by mouth daily.   Yes Historical Provider, MD  clopidogrel (PLAVIX) 75 MG tablet Take 75 mg by mouth daily.   Yes Historical Provider, MD  metoprolol succinate (TOPROL-XL) 25 MG 24 hr tablet Take 25 mg by mouth every morning.    Yes Historical Provider, MD  nitroGLYCERIN (NITROSTAT) 0.4 MG SL tablet Place 0.4 mg under the tongue every 5 (five) minutes as needed for chest pain.   Yes Historical Provider, MD  Oxycodone HCl 10 MG TABS Take 10 mg by mouth every 8 (eight) hours as needed (pain).    Yes Historical Provider, MD  oxyCODONE-acetaminophen (PERCOCET/ROXICET) 5-325 MG per tablet Take 1 tablet by mouth every 6 (six) hours as needed for severe pain. 08/05/13  Yes Resa Miner Lawyer, PA-C  pantoprazole (PROTONIX) 40 MG tablet Take 40 mg by mouth daily.   Yes Historical Provider, MD  promethazine (PHENERGAN) 25 MG tablet Take 1 tablet (25 mg total) by mouth every 8 (eight) hours as needed for nausea or vomiting. 08/05/13  Yes Resa Miner Lawyer, PA-C  rivaroxaban (XARELTO) 20 MG TABS tablet Take 1 tablet (20 mg total) by mouth daily with supper. 09/04/13  Yes Festus Aloe, MD   BP 112/56  Pulse 62  Temp(Src) 97.7 F (36.5 C) (Oral)  Resp 16  SpO2 96% Physical Exam  Nursing note and  vitals reviewed. Constitutional: He is oriented to person, place, and time. He appears well-developed.  HENT:  Head: Normocephalic and atraumatic.  Eyes: Conjunctivae and EOM are normal. Pupils are equal, round, and reactive to light.  Neck: Normal range of motion. Neck supple. No JVD present.  Cardiovascular: Normal rate and regular rhythm.   Pulmonary/Chest: Effort normal and breath sounds normal.  Abdominal: Soft. Bowel sounds are normal. He exhibits no distension. There is tenderness. There is no rebound and no guarding.  Epigastric and suprapubic tenderness, with some guarding, also L flank tenderness.  Neurological: He is alert and oriented to person, place, and time.  Skin: Skin is warm.    ED Course  Procedures (including critical care time) Labs Review Labs Reviewed  CBC - Abnormal; Notable for the following:    WBC 14.1 (*)    All other components within normal limits  BASIC METABOLIC PANEL - Abnormal; Notable for the following:    Glucose, Bld 137 (*)    All other components within normal limits  URINALYSIS, ROUTINE W REFLEX MICROSCOPIC - Abnormal; Notable for the following:    Color, Urine ORANGE (*)    APPearance CLOUDY (*)    pH 8.5 (*)    Hgb urine dipstick LARGE (*)    Ketones, ur 15 (*)    Protein, ur >300 (*)    Leukocytes, UA SMALL (*)    All other components within normal limits  URINE MICROSCOPIC-ADD ON - Abnormal; Notable for the following:    Bacteria, UA FEW (*)    All other components within normal limits  URINE CULTURE  APTT  PROTIME-INR  TROPONIN I  LIPASE, BLOOD  TROPONIN I  I-STAT TROPOININ, ED    Imaging Review Ct Abdomen Pelvis W Contrast  09/05/2013   CLINICAL DATA:  Worsening abdominal pain and nausea and vomiting. Recent ureteral stent placement for urolithiasis.  EXAM: CT ABDOMEN AND PELVIS WITH CONTRAST  TECHNIQUE: Multidetector CT imaging of the abdomen and pelvis was performed using the standard protocol following bolus  administration of intravenous contrast.  CONTRAST:  74mL OMNIPAQUE IOHEXOL 300 MG/ML  SOLN  COMPARISON:  Noncontrast CT on 08/05/2013  FINDINGS: Liver:  No mass or other parenchymal abnormality identified.  Gallbladder/Biliary:  Unremarkable.  Pancreas: No mass, inflammatory changes, or other parenchymal abnormality identified.  Spleen:  Within normal limits in size and appearance.  Adrenal Glands:  No mass identified.  Kidneys/Urinary Tract: A double-J left ureteral stent is now seen which is in appropriate position. Moderate left hydronephrosis is increased since prior exam. No ureteral calculi are seen along the course of the left ureteral stent. Several calculi are seen in the left intrarenal collecting system largest in the lower pole measuring 7 mm. Probable vascular calcifications again seen in the right renal hilum. No evidence of right-sided ureteral calculi or hydronephrosis. Tiny right renal cysts noted, however no renal masses are identified.  Lymph Nodes:  No pathologically enlarged lymph nodes identified.  Bowel/Peritoneum: No evidence of bowel wall thickening, mass, or obstruction.  Pelvic/Reproductive Organs: No mass or other significant abnormality identified.  Vascular:  No evidence of abdominal aortic aneurysm.  Musculoskeletal:  No suspicious bone lesions identified.  Other:  None.  IMPRESSION: Left ureteral stent now seen in appropriate position. Increased moderate left hydronephrosis compared to previous study, although etiology is not apparent by CT.  Small nonobstructive calculi within left renal collecting system, largest measuring 7 mm.   Electronically Signed   By: Earle Gell M.D.   On: 09/05/2013 11:40   Dg Abd Acute W/chest  09/05/2013   CLINICAL DATA:  Abdominal pain with nausea vomiting.  EXAM: ACUTE ABDOMEN SERIES (ABDOMEN 2 VIEW & CHEST 1 VIEW)  COMPARISON:  Abdomen film from 08/08/2013.  FINDINGS: The lungs are clear without focal consolidation, edema, effusion or pneumothorax.  Cardio pericardial silhouette is within normal limits for size. Imaged bony structures of the thorax are intact.  Upright film shows no intraperitoneal free air. The supine film shows a paucity of bowel gas without radiographic findings to suggest bowel obstruction. Approximately 4 stones are identified in the interpolar region of the left kidney and the stones seen in the region of the left renal pelvis on the previous exam is no longer evident. A left double-J internal ureteral stent is new in the interval. The proximal loop overlies the upper left renal pelvis are may extend into  a left upper pole collecting system. The lower loop is formed over the right paramidline bladder suggesting intraluminal position within the bladder.  IMPRESSION: No acute cardiopulmonary findings. No evidence for bowel obstruction or perforation.  The left internal ureteral stent appears appropriately positioned.   Electronically Signed   By: Misty Stanley M.D.   On: 09/05/2013 09:11     EKG Interpretation   Date/Time:  Monday September 05 2013 07:03:26 EDT Ventricular Rate:  55 PR Interval:  176 QRS Duration: 90 QT Interval:  426 QTC Calculation: 407 R Axis:   48 Text Interpretation:  Sinus bradycardia with sinus arrhythmia Normal  intervals Non specific ST and T wave changes No acute changes Confirmed by  Kathrynn Humble, MD, Thelma Comp [phone]) on 09/05/2013 7:14:58 AM      MDM   Final diagnoses:  Anginal pain  CAD, multiple vessel  Hydronephrosis of left kidney  Left ureteral stone    53 y.o with 3 complains - 1. severe epigastric pain, 2. Suprapubic and L flank pain, and 3 Chest pain. Hx of CAD s/p PCI, and L sided stone with stent placement recently  DDX: DDx includes: Pancreatitis Hepatobiliary pathology including cholecystitis Gastritis/PUD ACS syndrome Aortic Dissection Urethrolithiasis Complication of the stent placement in the ureters  Chest pain - pt's pain is similar to his NSTEMI. Cards consulted.   Plavic given - as chest pain responded in the ER to the nitro and pt is now chest pain free. Suspect unstable angina vs. NSTEMI. Holding anticoagulation, pt is already on xarelto and is having gross hematuria.  Pt's epigastric pain - unknown etiology. Will get labs. Dissection unlikely. There is no heavy smoking hx, or  fam hx of Muscular disorder. Pt has normal distal pulses, and no neuro complains. ? PUD.  Pt's lower quadrant pain and hematuria - likely from the recent GU procedure. ? Stent movement, ? Passing stone. Will talk to urology.   12:35 PM UROLOGY CLEARS the patient. Cards would like medicine admission for r/o. Pt is still chest pain free. CT ordered, as he had persistent epigastric pain - which was really severe at onset. It is neg. Will admit.  Varney Biles, MD 09/05/13 [place], MD 09/05/13 1235

## 2013-09-05 NOTE — H&P (Addendum)
[facility] History and Physical  KAIREE ISA Todd:[id number] DOB: Jul 05, 1960 DOA: 09/05/2013   PCP: Imelda Pillow, NP  Specialists: Cardiologist is [name]. Urologist is Dr. Junious Silk  Chief Complaint: Abdominal pain, chest pain, nausea, vomiting, since yesterday  HPI: Paul Todd is a 53 y.o. male with a past medical history of coronary artery disease, with a recent MI in February. He is status post drug-eluting stents to his RCA and mid LAD. He also had paroxysmal atrial fibrillation requiring cardioversion. EF is about 50%. He was changed over from warfarin to Xarelto recently. He was also taken off of his aspirin in July. According to the last note by cardiology it is quite possible that in 6 months he may be able to come off of anticoagulation altogether if he maintains sinus rhythm. Currently, he is on Plavix, along with Xarelto. Patient underwent a urethral stent placement on August 28 for nephrolithiasis. He was discharged on August 29. He was experiencing some left-sided flank pain. He's had hematuria for the last 2 weeks due to the stones. Yesterday evening his symptoms got worse. His pain in the abdomen became 10 out of 10 in intensity. He passed a lot of blood in the urine. He then became nauseated and had multiple episodes of vomiting towards the end of which he saw some blood in the emesis as well. And, then this morning he started having chest pain in the left side of his chest without radiation. He had some dizziness. He has had chills or night and he thinks he may have had a fever although he did not check his temperature. Chest pain was 4 of 10 in intensity this morning. He took 2 nitroglycerin. Came in to the emergency department and was given 3 more nitroglycerin and his pain has almost resolved. He was taken off of his anticoagulation for about 3 days to undergo the urological procedure. He resumed Xarelto after he went home.  Home Medications: Prior to  Admission medications   Medication Sig Start Date End Date Taking? Authorizing Provider  acetaminophen (TYLENOL) 650 MG CR tablet Take 650 mg by mouth every 8 (eight) hours as needed for pain.   Yes Historical Provider, MD  atorvastatin (LIPITOR) 80 MG tablet Take 80 mg by mouth daily.   Yes Historical Provider, MD  clopidogrel (PLAVIX) 75 MG tablet Take 75 mg by mouth daily.   Yes Historical Provider, MD  metoprolol succinate (TOPROL-XL) 25 MG 24 hr tablet Take 25 mg by mouth every morning.    Yes Historical Provider, MD  nitroGLYCERIN (NITROSTAT) 0.4 MG SL tablet Place 0.4 mg under the tongue every 5 (five) minutes as needed for chest pain.   Yes Historical Provider, MD  Oxycodone HCl 10 MG TABS Take 10 mg by mouth every 8 (eight) hours as needed (pain).    Yes Historical Provider, MD  oxyCODONE-acetaminophen (PERCOCET/ROXICET) 5-325 MG per tablet Take 1 tablet by mouth every 6 (six) hours as needed for severe pain. 08/05/13  Yes Resa Miner Lawyer, PA-C  pantoprazole (PROTONIX) 40 MG tablet Take 40 mg by mouth daily.   Yes Historical Provider, MD  promethazine (PHENERGAN) 25 MG tablet Take 1 tablet (25 mg total) by mouth every 8 (eight) hours as needed for nausea or vomiting. 08/05/13  Yes Resa Miner Lawyer, PA-C  rivaroxaban (XARELTO) 20 MG TABS tablet Take 1 tablet (20 mg total) by mouth daily with supper. 09/04/13  Yes Festus Aloe, MD    Allergies: No Known Allergies  Past Medical History: Past Medical History  Diagnosis Date  . Nephrolithiasis   . PAF (paroxysmal atrial fibrillation)     during a kidney stone attack  . Dyslipidemia   . CAD (coronary artery disease)     multivessel per cath 02/12/13, s/p stenting to RCA  . Hypertension   . Dysrhythmia     hx of atrial fib   . Sleep apnea     has not received equip as of 08/23/13    . Arthritis   . Myocardial infarction     02/12/2013   . Nausea & vomiting   . Old myocardial infarction     Past Surgical History  Procedure  Laterality Date  . Tee without cardioversion N/A 02/15/2013    Procedure: TRANSESOPHAGEAL ECHOCARDIOGRAM (TEE);  Surgeon: Thayer Headings, MD;  Location: [facility];  Service: Cardiovascular;  Laterality: N/A;  . Cardioversion N/A 02/15/2013    Procedure: CARDIOVERSION;  Surgeon: Thayer Headings, MD;  Location: [facility] ENDOSCOPY;  Service: Cardiovascular;  Laterality: N/A;  . Coronary angioplasty with stent placement  02/12/2013  . Cystoscopy with retrograde pyelogram, ureteroscopy and stent placement Left 09/02/2013    Procedure: CYSTOSCOPY WITH RETROGRADE PYELOGRAM/LEFT URETEROSCOPY/URETERAL STENT, with fulguration;  Surgeon: Festus Aloe, MD;  Location: [facility];  Service: Urology;  Laterality: Left;    Social History: He lives in [place] with his wife. 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 Quit smoking in February when he had his heart attack. He has about 60-70-pack-year history of smoking. Occasional wine intake. Independent with daily activities.  Family History:  Family History  Problem Relation Age of Onset  . Heart disease Mother   . Diabetes Mother   . Diabetes Father      Review of Systems - History obtained from the patient General ROS: positive for  - chills Psychological ROS: negative Ophthalmic ROS: negative ENT ROS: negative Allergy and Immunology ROS: negative Hematological and Lymphatic ROS: negative Endocrine ROS: negative Respiratory ROS: no cough, shortness of breath, or wheezing Cardiovascular ROS: as in hpi Gastrointestinal ROS: as in hpi Genito-Urinary ROS: as in hpi Musculoskeletal ROS: negative Neurological ROS: no TIA or stroke symptoms Dermatological ROS: negative  Physical Examination  Filed Vitals:   09/05/13 1136 09/05/13 1145 09/05/13 1200 09/05/13 1230  BP: 139/60 121/51 112/56 138/72  Pulse:  56 62 53  Temp:      TempSrc:      Resp: _0 SpO2: 99% 97% 96% 94%    BP 138/72  Pulse 53  Temp(Src) 97.7 F (36.5 C) (Oral)   Resp 15  SpO2 94%  General appearance: alert, cooperative, appears stated age and no distress Head: Normocephalic, without obvious abnormality, atraumatic Eyes: conjunctivae/corneas clear. PERRL, EOM's intact.  Throat: lips, mucosa, and tongue normal; teeth and gums normal Neck: no adenopathy, no carotid bruit, no JVD, supple, symmetrical, trachea midline and thyroid not enlarged, symmetric, no tenderness/mass/nodules Back: symmetric, no curvature. ROM normal. No CVA tenderness. Resp: clear to auscultation bilaterally Cardio: regular rate and rhythm, S1, S2 normal, no murmur, click, rub or gallop GI: soft, non-tender; bowel sounds normal; no masses,  no organomegaly Extremities: extremities normal, atraumatic, no cyanosis or edema Pulses: 2+ and symmetric Skin: Skin color, texture, turgor normal. No rashes or lesions Lymph nodes: Cervical, supraclavicular, and axillary nodes normal. Neurologic: Alert and oriented x3. No focal neurological deficits are noted  Laboratory Data: Results for orders placed during the hospital encounter of 09/05/13 (from the past  48 hour(s))  CBC     Status: Abnormal   Collection Time    09/05/13  7:25 AM      Result Value Ref Range   WBC 14.1 (*) 4.0 - 10.5 K/uL   RBC 4.61  4.22 - 5.81 MIL/uL   Hemoglobin 14.2  13.0 - 17.0 g/dL   HCT 41.1  39.0 - 52.0 %   MCV 89.2  78.0 - 100.0 fL   MCH 30.8  26.0 - 34.0 pg   MCHC 34.5  30.0 - 36.0 g/dL   RDW 12.9  11.5 - 15.5 %   Platelets 187  150 - 400 K/uL  BASIC METABOLIC PANEL     Status: Abnormal   Collection Time    09/05/13  7:25 AM      Result Value Ref Range   Sodium 139  137 - 147 mEq/L   Potassium 4.0  3.7 - 5.3 mEq/L   Chloride 101  96 - 112 mEq/L   CO2 23  19 - 32 mEq/L   Glucose, Bld 137 (*) 70 - 99 mg/dL   BUN 15  6 - 23 mg/dL   Creatinine, Ser 0.79  0.50 - 1.35 mg/dL   Calcium 9.2  8.4 - 10.5 mg/dL   GFR calc non Af Amer >90  >90 mL/min   GFR calc Af Amer >90  >90 mL/min   Comment: (NOTE)      The eGFR has been calculated using the CKD EPI equation.     This calculation has not been validated in all clinical situations.     eGFR's persistently <90 mL/min signify possible Chronic Kidney     Disease.   Anion gap 15  5 - 15  I-STAT TROPOININ, ED     Status: None   Collection Time    09/05/13  7:39 AM      Result Value Ref Range   Troponin i, poc 0.00  0.00 - 0.08 ng/mL   Comment 3            Comment: Due to the release kinetics of cTnI,     a negative result within the first hours     of the onset of symptoms does not rule out     myocardial infarction with certainty.     If myocardial infarction is still suspected,     repeat the test at appropriate intervals.  URINALYSIS, ROUTINE W REFLEX MICROSCOPIC     Status: Abnormal   Collection Time    09/05/13  8:25 AM      Result Value Ref Range   Color, Urine ORANGE (*) YELLOW   Comment: URINALYSIS PERFORMED ON SUPERNATANT     BIOCHEMICALS MAY BE AFFECTED BY COLOR     URINALYSIS PREFORMED ON SUPERNATE BECAUSE THE URINE IS GROSSLY BLOODY   APPearance CLOUDY (*) CLEAR   Specific Gravity, Urine 1.019  1.005 - 1.030   pH 8.5 (*) 5.0 - 8.0   Glucose, UA NEGATIVE  NEGATIVE mg/dL   Hgb urine dipstick LARGE (*) NEGATIVE   Bilirubin Urine NEGATIVE  NEGATIVE   Ketones, ur 15 (*) NEGATIVE mg/dL   Protein, ur >300 (*) NEGATIVE mg/dL   Urobilinogen, UA 1.0  0.0 - 1.0 mg/dL   Nitrite NEGATIVE  NEGATIVE   Leukocytes, UA SMALL (*) NEGATIVE  URINE MICROSCOPIC-ADD ON     Status: Abnormal   Collection Time    09/05/13  8:25 AM      Result Value Ref Range   WBC,  UA TOO NUMEROUS TO COUNT  <3 WBC/hpf   RBC / HPF TOO NUMEROUS TO COUNT  <3 RBC/hpf   Bacteria, UA FEW (*) RARE    Radiology Reports: Ct Abdomen Pelvis W Contrast  09/05/2013   CLINICAL DATA:  Worsening abdominal pain and nausea and vomiting. Recent ureteral stent placement for urolithiasis.  EXAM: CT ABDOMEN AND PELVIS WITH CONTRAST  TECHNIQUE: Multidetector CT imaging of the  abdomen and pelvis was performed using the standard protocol following bolus administration of intravenous contrast.  CONTRAST:  19m OMNIPAQUE IOHEXOL 300 MG/ML  SOLN  COMPARISON:  Noncontrast CT on 08/05/2013  FINDINGS: Liver:  No mass or other parenchymal abnormality identified.  Gallbladder/Biliary:  Unremarkable.  Pancreas: No mass, inflammatory changes, or other parenchymal abnormality identified.  Spleen:  Within normal limits in size and appearance.  Adrenal Glands:  No mass identified.  Kidneys/Urinary Tract: A double-J left ureteral stent is now seen which is in appropriate position. Moderate left hydronephrosis is increased since prior exam. No ureteral calculi are seen along the course of the left ureteral stent. Several calculi are seen in the left intrarenal collecting system largest in the lower pole measuring 7 mm. Probable vascular calcifications again seen in the right renal hilum. No evidence of right-sided ureteral calculi or hydronephrosis. Tiny right renal cysts noted, however no renal masses are identified.  Lymph Nodes:  No pathologically enlarged lymph nodes identified.  Bowel/Peritoneum: No evidence of bowel wall thickening, mass, or obstruction.  Pelvic/Reproductive Organs: No mass or other significant abnormality identified.  Vascular:  No evidence of abdominal aortic aneurysm.  Musculoskeletal:  No suspicious bone lesions identified.  Other:  None.  IMPRESSION: Left ureteral stent now seen in appropriate position. Increased moderate left hydronephrosis compared to previous study, although etiology is not apparent by CT.  Small nonobstructive calculi within left renal collecting system, largest measuring 7 mm.   Electronically Signed   By: JEarle GellM.D.   On: 09/05/2013 11:40   Dg Abd Acute W/chest  09/05/2013   CLINICAL DATA:  Abdominal pain with nausea vomiting.  EXAM: ACUTE ABDOMEN SERIES (ABDOMEN 2 VIEW & CHEST 1 VIEW)  COMPARISON:  Abdomen film from 08/08/2013.  FINDINGS: The  lungs are clear without focal consolidation, edema, effusion or pneumothorax. Cardio pericardial silhouette is within normal limits for size. Imaged bony structures of the thorax are intact.  Upright film shows no intraperitoneal free air. The supine film shows a paucity of bowel gas without radiographic findings to suggest bowel obstruction. Approximately 4 stones are identified in the interpolar region of the left kidney and the stones seen in the region of the left renal pelvis on the previous exam is no longer evident. A left double-J internal ureteral stent is new in the interval. The proximal loop overlies the upper left renal pelvis are may extend into a left upper pole collecting system. The lower loop is formed over the right paramidline bladder suggesting intraluminal position within the bladder.  IMPRESSION: No acute cardiopulmonary findings. No evidence for bowel obstruction or perforation.  The left internal ureteral stent appears appropriately positioned.   Electronically Signed   By: EMisty StanleyM.D.   On: 09/05/2013 09:11    Electrocardiogram: Sinus bradycardia at 55 beats per minute. Normal axis. Intervals are normal. No Q waves. No concerning ST changes. Nonspecific T wave, changes.  Problem List  Principal Problem:   Chest pain Active Problems:   PAF (paroxysmal atrial fibrillation)   CAD S/P percutaneous coronary angioplasty -  DES to Mid LAD; 2x 65m x 38 mm Xience DES to RCA   Obesity (BMI 35.0-39.9 without comorbidity)   Nephrolithiasis   Hematuria, gross   Nausea and vomiting   Assessment: This is a 53year old, Caucasian male, who presents to the hospital with abdominal pain, nausea, vomiting, and chest pain, along with hematuria. I think his chest pain was most likely due to his episodes of vomiting. His EKG was nonischemic. Troponin is normal. Nausea, vomiting, is most likely due to nephrolithiasis. He had a recent ureteral stent. The stent appears to be in appropriate  position based on CT scan.  Plan: #1 chest pain in the setting of known coronary artery disease with drug-eluting stents to RCA and LAD placed in February of 2015. Continue with his Plavix. EKG will be repeated in the morning. He has been seen by cardiology. Serial troponins will be obtained. Chest pain appears to be related to his GI illness rather than cardiac process.  #2 nausea, vomiting, abdominal pain, possible mild Mallory-Weiss tear and hematuria: All this is most likely due to his nephrolithiasis. ED physician has discussed with Dr. EJunious Silkand they state that hematuria is to be expected considering the kidney stones, and the fact, that he is on anticoagulation. We will treat him symptomatically at this time. We will hold his anticoagulation till his acute illness subsides. He did report some blood in the emesis. This is probably mild Mallory-Weiss tear. Monitor closely. Urine cultures will be followed up on. He'll be placed on ceftriaxone. CT abdomen did not show any other concerning process. Monitor hemoglobin. Continue with his PPI. Clear liquids for now. Advance as tolerated.  #3 history of paroxysmal atrial fibrillation status post cardioversion in February of 2015. He is on beta blocker, which will be continued. He is currently in sinus rhythm. He is on anticoagulation, but due to his significant hematuria and possible mild Mallory-Weiss tear, we will hold it for now. He is also on Plavix, which cannot be discontinued, as he had drug-eluting stents. But I think it will be safe to hold his anticoagulation for a few days. Cardiology is also following.  #4 history of CAD: As discussed above. Continue with his Plavix and beta blocker. Cardiology is following.  #5 history of nephrolithiasis: As discussed above. Ureteral stent is in place.   #6 chronic back pain: Continue with pain medications as needed  DVT Prophylaxis: SCDs Code Status: Full code Family Communication: Discussed with the  patient and his wife  Disposition Plan: Admit to Tele   Further management decisions will depend on results of further testing and patient's response to treatment.   [facility] Pager [phone] If 7PM-7AM, please contact night-coverage [email or web address] Password [email or web address]  09/05/2013, 1:15 PM  Disclaimer: This note was dictated with voice recognition software. Similar sounding words can inadvertently be transcribed and may not be corrected upon review.

## 2013-09-05 NOTE — ED Notes (Signed)
Patient transported to CT without distress 

## 2013-09-05 NOTE — Consult Note (Signed)
CARDIOLOGY CONSULT NOTE      Patient ID: Paul Todd MRN: [id number] DOB/AGE: [birth year] 53 y.o.  Admit date: 09/05/2013 Referring Physician [facility] ED  Primary Cardiologist [name] Reason for Consultation chest pain  HPI: 53 year old man with history of MI in February 2015. He had several stents placed at that time. He had kidney stones and required ureteral stents on Friday. He had significant nausea and vomiting post procedure and was kept overnight. He was discharged on Saturday. He felt better late Saturday and throughout the day on Sunday. He had dinner on Sunday. After this, he developed some abdominal pain and nausea and vomiting. He felt some discomfort in his chest which was not like his prior angina. He currently feels intermittent severe abdominal pain with intermittent nausea.  Since his stent in February, he has not had any exertional symptoms. He feels that the symptoms he is having now are much less significant than what he had in February.  Review of systems complete and found to be negative unless listed above   Past Medical History  Diagnosis Date  . Nephrolithiasis   . PAF (paroxysmal atrial fibrillation)     during a kidney stone attack  . Dyslipidemia   . CAD (coronary artery disease)     multivessel per cath 02/12/13, s/p stenting to RCA  . Hypertension   . Dysrhythmia     hx of atrial fib   . Sleep apnea     has not received equip as of 08/23/13    . Arthritis   . Myocardial infarction     02/12/2013     Family History  Problem Relation Age of Onset  . Heart disease Mother   . Diabetes Mother   . Diabetes Father     History   Social History  . Marital Status: Divorced    Spouse Name: N/A    Number of Children: N/A  . Years of Education: N/A   Occupational History  . Not on file.   Social History Main Topics  . Smoking status: Former Smoker -- 0.50 packs/day for 35 years    Types: Cigarettes    Quit date: 02/12/2013  . Smokeless tobacco:  Never Used  . Alcohol Use: Yes     Comment: rare   . Drug Use: No  . Sexual Activity: Yes    Birth Control/ Protection: None   Other Topics Concern  . Not on file   Social History Narrative  . No narrative on file    Past Surgical History  Procedure Laterality Date  . Tee without cardioversion N/A 02/15/2013    Procedure: TRANSESOPHAGEAL ECHOCARDIOGRAM (TEE);  Surgeon: Thayer Headings, MD;  Location: [facility];  Service: Cardiovascular;  Laterality: N/A;  . Cardioversion N/A 02/15/2013    Procedure: CARDIOVERSION;  Surgeon: Thayer Headings, MD;  Location: [facility] ENDOSCOPY;  Service: Cardiovascular;  Laterality: N/A;  . Coronary angioplasty with stent placement  02/12/2013      (Not in a hospital admission)  Physical Exam: Vitals:   Filed Vitals:   09/05/13 0909 09/05/13 0915 09/05/13 0930 09/05/13 0945  BP: 142/60 131/62 146/65 145/64  Pulse:  48 48 51  Temp:      TempSrc:      Resp: 24     SpO2: 99% 100% 100% 97%   I&O's:   Intake/Output Summary (Last 24 hours) at 09/05/13 0957 Last data filed at 09/05/13 0831  Gross per 24 hour  Intake      0  ml  Output    300 ml  Net   -300 ml   Physical exam:  [place]/AT EOMI No JVD, No carotid bruit RRR S1S2  No wheezing Soft. Pain with palpation of the upper abdomen. No guarding, nondistended No edema. No focal motor or sensory deficits Normal affect  Labs:   Lab Results  Component Value Date   WBC 14.1* 09/05/2013   HGB 14.2 09/05/2013   HCT 41.1 09/05/2013   MCV 89.2 09/05/2013   PLT 187 09/05/2013    Recent Labs Lab 09/05/13 0725  NA 139  K 4.0  CL 101  CO2 23  BUN 15  CREATININE 0.79  CALCIUM 9.2  GLUCOSE 137*   Lab Results  Component Value Date   TROPONINI >20.00* 02/13/2013    Lab Results  Component Value Date   CHOL 203* 11/23/2012   Lab Results  Component Value Date   HDL 37* 11/23/2012   Lab Results  Component Value Date   LDLCALC 111* 01/28/2013   LDLCALC 120* 11/23/2012   Lab Results    Component Value Date   TRIG 206* 01/28/2013   TRIG 229* 11/23/2012   Lab Results  Component Value Date   CHOLHDL 5.5 11/23/2012   No results found for this basename: LDLDIRECT      Radiology: No acute cardiopulmonary disease, no evidence of bowel obstruction EKG: Normal sinus rhythm, no ST segment changes  ASSESSMENT AND PLAN: CAD, nausea, vomiting, chest pain, history of atrial fibrillation, prior MI  1) symptoms atypical for his angina. Continue to rule out for MI. Watch on telemetry until rule out is complete. I don't think this episode represents an acute coronary syndrome. Would recommend further workup of his nausea and vomiting.  No evidence of CHF on exam or on chest x-ray.  2) atrial fibrillation: Currently maintaining sinus rhythm. He did have a cardioversion in February 2015. He's currently on Xarelto for stroke prevention. This may increase bleeding risk since he is also on clopidogrel. Will leave decision for long-term anticoagulation to his regular cardiologist, [name]. Consideration should be made for Coumadin therapy while on clopidogrel.  Agree with no aspirin for this patient. Signed:   Mina Marble, MD, [facility] 09/05/2013, 9:57 AM

## 2013-09-05 NOTE — ED Notes (Signed)
Patient here with complaint of chest pain which began last night. States that he had ignored it until it worsened this morning. Patient took 1 NTG SL from home supply prior to arrival, that reduced his pain from 8 to 5/10. Additionally patient was recently treated at [facility] for kidney stone and had a stent placed to allow drainage. Patient explains that yesterday he started having abdominal pain again and started passing blood in his urine again. Currently states that after the NTG his abdomen is hurting worse than his chest.

## 2013-09-05 NOTE — ED Notes (Signed)
Patient is resting comfortably. More coffee given to family

## 2013-09-05 NOTE — ED Notes (Signed)
Pt's pain and nausea returning; MD aware.

## 2013-09-05 NOTE — ED Notes (Signed)
Pt drinking contrast. 

## 2013-09-05 NOTE — ED Notes (Signed)
Pt put on portal monitor to be taken upstairs to 3W5

## 2013-09-05 NOTE — ED Notes (Signed)
Patient transported to CT 

## 2013-09-05 NOTE — ED Notes (Signed)
PT reports pain in chest that feels like when he last had heart attack; described as ripping and stabbing and radiating to back. Pt reports belly pain is worse though than chest pain; does not feel like when he had kidney stone although recent stent placed.

## 2013-09-05 NOTE — ED Notes (Signed)
MD at bedside. 

## 2013-09-06 ENCOUNTER — Encounter (HOSPITAL_COMMUNITY): Payer: Self-pay | Admitting: General Practice

## 2013-09-06 DIAGNOSIS — N201 Calculus of ureter: Secondary | ICD-10-CM

## 2013-09-06 DIAGNOSIS — R079 Chest pain, unspecified: Secondary | ICD-10-CM

## 2013-09-06 DIAGNOSIS — I2510 Atherosclerotic heart disease of native coronary artery without angina pectoris: Secondary | ICD-10-CM

## 2013-09-06 DIAGNOSIS — Z9861 Coronary angioplasty status: Secondary | ICD-10-CM

## 2013-09-06 DIAGNOSIS — R112 Nausea with vomiting, unspecified: Secondary | ICD-10-CM

## 2013-09-06 DIAGNOSIS — N200 Calculus of kidney: Secondary | ICD-10-CM

## 2013-09-06 DIAGNOSIS — I209 Angina pectoris, unspecified: Secondary | ICD-10-CM

## 2013-09-06 LAB — CBC
HCT: 39.2 % (ref 39.0–52.0)
HEMOGLOBIN: 13.1 g/dL (ref 13.0–17.0)
MCH: 31.1 pg (ref 26.0–34.0)
MCHC: 33.4 g/dL (ref 30.0–36.0)
MCV: 93.1 fL (ref 78.0–100.0)
Platelets: 156 10*3/uL (ref 150–400)
RBC: 4.21 MIL/uL — ABNORMAL LOW (ref 4.22–5.81)
RDW: 13.3 % (ref 11.5–15.5)
WBC: 11.1 10*3/uL — ABNORMAL HIGH (ref 4.0–10.5)

## 2013-09-06 LAB — COMPREHENSIVE METABOLIC PANEL
ALT: 24 U/L (ref 0–53)
ANION GAP: 11 (ref 5–15)
AST: 19 U/L (ref 0–37)
Albumin: 3.1 g/dL — ABNORMAL LOW (ref 3.5–5.2)
Alkaline Phosphatase: 59 U/L (ref 39–117)
BUN: 12 mg/dL (ref 6–23)
CALCIUM: 8.4 mg/dL (ref 8.4–10.5)
CO2: 22 mEq/L (ref 19–32)
Chloride: 107 mEq/L (ref 96–112)
Creatinine, Ser: 0.85 mg/dL (ref 0.50–1.35)
GFR calc non Af Amer: >90 mL/min (ref >90)
GLUCOSE: 92 mg/dL (ref 70–99)
Potassium: 4 mEq/L (ref 3.7–5.3)
SODIUM: 140 meq/L (ref 137–147)
TOTAL PROTEIN: 5.8 g/dL — AB (ref 6.0–8.3)
Total Bilirubin: 1 mg/dL (ref 0.3–1.2)

## 2013-09-06 LAB — URINE CULTURE
CULTURE: NO GROWTH
Colony Count: NO GROWTH

## 2013-09-06 LAB — TROPONIN I: Troponin I: <0.3 ng/mL (ref <0.30)

## 2013-09-06 MED ORDER — ONDANSETRON HCL 4 MG PO TABS: 4.0000 mg | ORAL_TABLET | Freq: Four times a day (QID) | ORAL | Status: DC | PRN

## 2013-09-06 MED ORDER — OXYCODONE-ACETAMINOPHEN 5-325 MG PO TABS: 1.0000 | ORAL_TABLET | Freq: Four times a day (QID) | ORAL | Status: DC | PRN

## 2013-09-06 NOTE — Progress Notes (Signed)
Patient is being discharged home. He received his discharge paperwork. RN answered all questions the patient had. Wife is at bedside and will be transporting the patient home

## 2013-09-06 NOTE — Discharge Summary (Signed)
Physician Discharge Summary  NIELS CRANSHAW WPY:[id number] DOB: 03-25-1960 DOA: 09/05/2013  PCP: Imelda Pillow, NP  Admit date: 09/05/2013 Discharge date: 09/06/2013  Time spent: 40 minutes  Recommendations for Outpatient Follow-up:  1. Followup with Dr. Junious Silk on 9/11 and with Dr. Pablo Lawrence in 2 weeks  Discharge Diagnoses:  Principal Problem:   Chest pain Active Problems:   PAF (paroxysmal atrial fibrillation)   CAD S/P percutaneous coronary angioplasty - DES to Mid LAD; 2x 49mm x 38 mm Xience DES to RCA   Obesity (BMI 35.0-39.9 without comorbidity)   Nephrolithiasis   Hematuria, gross   Nausea and vomiting   Discharge Condition: Stable  Diet recommendation: Heart healthy  Filed Weights   09/05/13 1454 09/06/13 0427  Weight: 99.791 kg (220 lb) 99.7 kg (219 lb 12.8 oz)    History of present illness:  MICHAE GRIMLEY is a 53 y.o. male with a past medical history of coronary artery disease, with a recent MI in February. He is status post drug-eluting stents to his RCA and mid LAD. He also had paroxysmal atrial fibrillation requiring cardioversion. EF is about 50%. He was changed over from warfarin to Xarelto recently. He was also taken off of his aspirin in July. According to the last note by cardiology it is quite possible that in 6 months he may be able to come off of anticoagulation altogether if he maintains sinus rhythm. Currently, he is on Plavix, along with Xarelto. Patient underwent a urethral stent placement on August 28 for nephrolithiasis. He was discharged on August 29. He was experiencing some left-sided flank pain. He's had hematuria for the last 2 weeks due to the stones. Yesterday evening his symptoms got worse. His pain in the abdomen became 10 out of 10 in intensity. He passed a lot of blood in the urine. He then became nauseated and had multiple episodes of vomiting towards the end of which he saw some blood in the emesis as well. And, then this morning he  started having chest pain in the left side of his chest without radiation. He had some dizziness. He has had chills or night and he thinks he may have had a fever although he did not check his temperature. Chest pain was 4 of 10 in intensity this morning. He took 2 nitroglycerin. Came in to the emergency department and was given 3 more nitroglycerin and his pain has almost resolved. He was taken off of his anticoagulation for about 3 days to undergo the urological procedure. He resumed Xarelto after he went home.  Hospital Course:   Chest pain -Patient has history of CAD with drug-eluting stent to the RCA and LAD placed in February of 2015. -Came in with chest pain primarily substernal to epigastric. -3 sets of cardiac enzymes were negative, seen by cardiology. -Cardiology recommended to continue his Plavix and metoprolol, his pain is likely noncardiac. -Patient has tenderness and there has rib cage, likely the pain is from repeated vomiting.  Nausea/vomiting/abdominal pain -Patient also reported very scant amount of blood with the vomiting. -Nausea and vomiting is likely secondary to his recent placement of his left-sided tube. -This is resolved, pain is more controlled now. -Had refill for Percocet 20 pills, Zofran prescribed for nausea and vomiting.  History of paroxysmal atrial fibrillation -Status post cardioversion in February of 2015, on beta blocker and Xarelto. -Continue Xarelto.  History of CAD -Patient is on Plavix, metoprolol, continued.  Procedures:  None  Consultations:  Cardiology  Discharge Exam: Filed  Vitals:   09/06/13 0427  BP: 153/74  Pulse: 54  Temp: 98.4 F (36.9 C)  Resp: 18   General: Alert and awake, oriented x3, not in any acute distress. HEENT: anicteric sclera, pupils reactive to light and accommodation, EOMI CVS: S1-S2 clear, no murmur rubs or gallops Chest: clear to auscultation bilaterally, no wheezing, rales or rhonchi Abdomen: soft  nontender, nondistended, normal bowel sounds, no organomegaly Extremities: no cyanosis, clubbing or edema noted bilaterally Neuro: Cranial nerves II-XII intact, no focal neurological deficits  Discharge Instructions You were cared for by a hospitalist during your hospital stay. If you have any questions about your discharge medications or the care you received while you were in the hospital after you are discharged, you can call the unit and asked to speak with the hospitalist on call if the hospitalist that took care of you is not available. Once you are discharged, your primary care physician will handle any further medical issues. Please note that NO REFILLS for any discharge medications will be authorized once you are discharged, as it is imperative that you return to your primary care physician (or establish a relationship with a primary care physician if you do not have one) for your aftercare needs so that they can reassess your need for medications and monitor your lab values.  Discharge Instructions   Diet - low sodium heart healthy    Complete by:  As directed      Increase activity slowly    Complete by:  As directed             Medication List    STOP taking these medications       Oxycodone HCl 10 MG Tabs     promethazine 25 MG tablet  Commonly known as:  PHENERGAN      TAKE these medications       acetaminophen 650 MG CR tablet  Commonly known as:  TYLENOL  Take 650 mg by mouth every 8 (eight) hours as needed for pain.     atorvastatin 80 MG tablet  Commonly known as:  LIPITOR  Take 80 mg by mouth daily.     clopidogrel 75 MG tablet  Commonly known as:  PLAVIX  Take 75 mg by mouth daily.     metoprolol succinate 25 MG 24 hr tablet  Commonly known as:  TOPROL-XL  Take 25 mg by mouth every morning.     nitroGLYCERIN 0.4 MG SL tablet  Commonly known as:  NITROSTAT  Place 0.4 mg under the tongue every 5 (five) minutes as needed for chest pain.     ondansetron  4 MG tablet  Commonly known as:  ZOFRAN  Take 1 tablet (4 mg total) by mouth every 6 (six) hours as needed for nausea.     oxyCODONE-acetaminophen 5-325 MG per tablet  Commonly known as:  PERCOCET/ROXICET  Take 1 tablet by mouth every 6 (six) hours as needed for severe pain.     pantoprazole 40 MG tablet  Commonly known as:  PROTONIX  Take 40 mg by mouth daily.     rivaroxaban 20 MG Tabs tablet  Commonly known as:  XARELTO  Take 1 tablet (20 mg total) by mouth daily with supper.       No Known Allergies     Follow-up Information   Follow up with Millsaps, Luane School, NP In 2 weeks.   Contact information:   [facility] [address] [place] [address] [phone]  Follow up with Festus Aloe, MD On 09/16/2013.   Specialty:  Urology   Contact information:   [place] [address] [phone]        The results of significant diagnostics from this hospitalization (including imaging, microbiology, ancillary and laboratory) are listed below for reference.    Significant Diagnostic Studies: Ct Abdomen Pelvis W Contrast  09/05/2013   CLINICAL DATA:  Worsening abdominal pain and nausea and vomiting. Recent ureteral stent placement for urolithiasis.  EXAM: CT ABDOMEN AND PELVIS WITH CONTRAST  TECHNIQUE: Multidetector CT imaging of the abdomen and pelvis was performed using the standard protocol following bolus administration of intravenous contrast.  CONTRAST:  71mL OMNIPAQUE IOHEXOL 300 MG/ML  SOLN  COMPARISON:  Noncontrast CT on 08/05/2013  FINDINGS: Liver:  No mass or other parenchymal abnormality identified.  Gallbladder/Biliary:  Unremarkable.  Pancreas: No mass, inflammatory changes, or other parenchymal abnormality identified.  Spleen:  Within normal limits in size and appearance.  Adrenal Glands:  No mass identified.  Kidneys/Urinary Tract: A double-J left ureteral stent is now seen which is in appropriate position. Moderate  left hydronephrosis is increased since prior exam. No ureteral calculi are seen along the course of the left ureteral stent. Several calculi are seen in the left intrarenal collecting system largest in the lower pole measuring 7 mm. Probable vascular calcifications again seen in the right renal hilum. No evidence of right-sided ureteral calculi or hydronephrosis. Tiny right renal cysts noted, however no renal masses are identified.  Lymph Nodes:  No pathologically enlarged lymph nodes identified.  Bowel/Peritoneum: No evidence of bowel wall thickening, mass, or obstruction.  Pelvic/Reproductive Organs: No mass or other significant abnormality identified.  Vascular:  No evidence of abdominal aortic aneurysm.  Musculoskeletal:  No suspicious bone lesions identified.  Other:  None.  IMPRESSION: Left ureteral stent now seen in appropriate position. Increased moderate left hydronephrosis compared to previous study, although etiology is not apparent by CT.  Small nonobstructive calculi within left renal collecting system, largest measuring 7 mm.   Electronically Signed   By: Earle Gell M.D.   On: 09/05/2013 11:40   Dg Abd Acute W/chest  09/05/2013   CLINICAL DATA:  Abdominal pain with nausea vomiting.  EXAM: ACUTE ABDOMEN SERIES (ABDOMEN 2 VIEW & CHEST 1 VIEW)  COMPARISON:  Abdomen film from 08/08/2013.  FINDINGS: The lungs are clear without focal consolidation, edema, effusion or pneumothorax. Cardio pericardial silhouette is within normal limits for size. Imaged bony structures of the thorax are intact.  Upright film shows no intraperitoneal free air. The supine film shows a paucity of bowel gas without radiographic findings to suggest bowel obstruction. Approximately 4 stones are identified in the interpolar region of the left kidney and the stones seen in the region of the left renal pelvis on the previous exam is no longer evident. A left double-J internal ureteral stent is new in the interval. The proximal loop  overlies the upper left renal pelvis are may extend into a left upper pole collecting system. The lower loop is formed over the right paramidline bladder suggesting intraluminal position within the bladder.  IMPRESSION: No acute cardiopulmonary findings. No evidence for bowel obstruction or perforation.  The left internal ureteral stent appears appropriately positioned.   Electronically Signed   By: Misty Stanley M.D.   On: 09/05/2013 09:11    Microbiology: Recent Results (from the past 240 hour(s))  URINE CULTURE     Status: None   Collection Time  09/05/13  8:25 AM      Result Value Ref Range Status   Specimen Description URINE, CLEAN CATCH   Final   Special Requests NONE   Final   Culture  Setup Time     Final   Value: 09/05/2013 12:31     Performed at [facility]     Final   Value: NO GROWTH     Performed at [facility]   Culture     Final   Value: NO GROWTH     Performed at [facility]   Report Status 09/06/2013 FINAL   Final     Labs: Basic Metabolic Panel:  Recent Labs Lab 09/05/13 0725 09/06/13 0034  NA 139 140  K 4.0 4.0  CL 101 107  CO2 23 22  GLUCOSE 137* 92  BUN 15 12  CREATININE 0.79 0.85  CALCIUM 9.2 8.4   Liver Function Tests:  Recent Labs Lab 09/06/13 0034  AST 19  ALT 24  ALKPHOS 59  BILITOT 1.0  PROT 5.8*  ALBUMIN 3.1*    Recent Labs Lab 09/05/13 1444  LIPASE 23   No results found for this basename: AMMONIA,  in the last 168 hours CBC:  Recent Labs Lab 09/05/13 0725 09/06/13 0034  WBC 14.1* 11.1*  HGB 14.2 13.1  HCT 41.1 39.2  MCV 89.2 93.1  PLT 187 156   Cardiac Enzymes:  Recent Labs Lab 09/05/13 1158 09/05/13 1444 09/05/13 1903 09/06/13 0034 09/06/13 0823  TROPONINI <0.30 <0.30 <0.30 <0.30 <0.30   BNP: BNP (last 3 results) No results found for this basename: PROBNP,  in the last 8760 hours CBG: No results found for this basename: GLUCAP,  in the last 168  hours     Signed:  Tyde Lamison A  [facility] 09/06/2013, 1:46 PM

## 2013-09-06 NOTE — Progress Notes (Signed)
Patient Name: Paul Todd Date of Encounter: 09/06/2013     Principal Problem:   Chest pain Active Problems:   PAF (paroxysmal atrial fibrillation)   CAD S/P percutaneous coronary angioplasty - DES to Mid LAD; 2x 52mm x 38 mm Xience DES to RCA   Obesity (BMI 35.0-39.9 without comorbidity)   Nephrolithiasis   Hematuria, gross   Nausea and vomiting    SUBJECTIVE  Only had mild chest discomfort overnight lasted <30 min. No SOB. N/V resolved, however has been burping quite frequently since admission  CURRENT MEDS . atorvastatin  80 mg Oral Daily  . cefTRIAXone (ROCEPHIN)  IV  1 g Intravenous Q24H  . clopidogrel  75 mg Oral Daily  . metoprolol succinate  25 mg Oral q morning - 10a  . pantoprazole  40 mg Oral Daily  . sodium chloride  3 mL Intravenous Q12H    OBJECTIVE  Filed Vitals:   09/05/13 1406 09/05/13 1454 09/05/13 2114 09/06/13 0427  BP: 142/73 111/59 121/60 153/74  Pulse: 54 54 53 54  Temp:  98.7 F (37.1 C) 98.4 F (36.9 C) 98.4 F (36.9 C)  TempSrc:  Oral Oral   Resp: 18 18 18 18   Height:  5\' 7"  (1.702 m)    Weight:  220 lb (99.791 kg)  219 lb 12.8 oz (99.7 kg)  SpO2: 99% 95% 95% 95%    Intake/Output Summary (Last 24 hours) at 09/06/13 0738 Last data filed at 09/05/13 0831  Gross per 24 hour  Intake      0 ml  Output    300 ml  Net   -300 ml   Filed Weights   09/05/13 1454 09/06/13 0427  Weight: 220 lb (99.791 kg) 219 lb 12.8 oz (99.7 kg)    PHYSICAL EXAM  General: Pleasant, NAD. Neuro: Alert and oriented X 3. Moves all extremities spontaneously. Psych: Normal affect. HEENT:  Normal  Neck: Supple without bruits or JVD. Lungs:  Resp regular and unlabored, CTA. Heart: RRR no s3, s4, or murmurs. Abdomen: Soft, non-tender, non-distended, BS + x 4.  Extremities: No clubbing, cyanosis or edema. DP/PT/Radials 2+ and equal bilaterally.  Accessory Clinical Findings  CBC  Recent Labs  09/05/13 0725 09/06/13 0034  WBC 14.1* 11.1*  HGB  14.2 13.1  HCT 41.1 39.2  MCV 89.2 93.1  PLT 187 338   Basic Metabolic Panel  Recent Labs  09/05/13 0725 09/06/13 0034  NA 139 140  K 4.0 4.0  CL 101 107  CO2 23 22  GLUCOSE 137* 92  BUN 15 12  CREATININE 0.79 0.85  CALCIUM 9.2 8.4   Liver Function Tests  Recent Labs  09/06/13 0034  AST 19  ALT 24  ALKPHOS 59  BILITOT 1.0  PROT 5.8*  ALBUMIN 3.1*    Recent Labs  09/05/13 1444  LIPASE 23   Cardiac Enzymes  Recent Labs  09/05/13 1444 09/05/13 1903 09/06/13 0034  TROPONINI <0.30 <0.30 <0.30    TELE NSR with HR 50s, no significant ventricular ectopy    ECG  Sinus brady with HR 50s, no significant ST-T wave changes  Echocardiogram  LV EF: 40% - 45%  ------------------------------------------------------------ Indications: Atrial fibrillation - 427.31.  ------------------------------------------------------------ History: PMH: Atrial fibrillation. Risk factors: Current tobacco use.  ------------------------------------------------------------ Study Conclusions  - Left ventricle: The cavity size was normal. Systolic function was mildly reduced. The estimated ejection fraction was in the range of 40% to 45%. No evidence of thrombus. - Aortic valve: Trivial regurgitation. -  Mitral valve: Mild regurgitation. - Left atrium: No evidence of thrombus in the atrial cavity or appendage. Impressions:  - No cardiac source of emboli was indentified. Successful cardioversion.      Radiology/Studies  Ct Abdomen Pelvis W Contrast  09/05/2013   CLINICAL DATA:  Worsening abdominal pain and nausea and vomiting. Recent ureteral stent placement for urolithiasis.  EXAM: CT ABDOMEN AND PELVIS WITH CONTRAST  TECHNIQUE: Multidetector CT imaging of the abdomen and pelvis was performed using the standard protocol following bolus administration of intravenous contrast.  CONTRAST:  36mL OMNIPAQUE IOHEXOL 300 MG/ML  SOLN  COMPARISON:  Noncontrast CT on 08/05/2013   FINDINGS: Liver:  No mass or other parenchymal abnormality identified.  Gallbladder/Biliary:  Unremarkable.  Pancreas: No mass, inflammatory changes, or other parenchymal abnormality identified.  Spleen:  Within normal limits in size and appearance.  Adrenal Glands:  No mass identified.  Kidneys/Urinary Tract: A double-J left ureteral stent is now seen which is in appropriate position. Moderate left hydronephrosis is increased since prior exam. No ureteral calculi are seen along the course of the left ureteral stent. Several calculi are seen in the left intrarenal collecting system largest in the lower pole measuring 7 mm. Probable vascular calcifications again seen in the right renal hilum. No evidence of right-sided ureteral calculi or hydronephrosis. Tiny right renal cysts noted, however no renal masses are identified.  Lymph Nodes:  No pathologically enlarged lymph nodes identified.  Bowel/Peritoneum: No evidence of bowel wall thickening, mass, or obstruction.  Pelvic/Reproductive Organs: No mass or other significant abnormality identified.  Vascular:  No evidence of abdominal aortic aneurysm.  Musculoskeletal:  No suspicious bone lesions identified.  Other:  None.  IMPRESSION: Left ureteral stent now seen in appropriate position. Increased moderate left hydronephrosis compared to previous study, although etiology is not apparent by CT.  Small nonobstructive calculi within left renal collecting system, largest measuring 7 mm.   Electronically Signed   By: Earle Gell M.D.   On: 09/05/2013 11:40   Dg Abd Acute W/chest  09/05/2013   CLINICAL DATA:  Abdominal pain with nausea vomiting.  EXAM: ACUTE ABDOMEN SERIES (ABDOMEN 2 VIEW & CHEST 1 VIEW)  COMPARISON:  Abdomen film from 08/08/2013.  FINDINGS: The lungs are clear without focal consolidation, edema, effusion or pneumothorax. Cardio pericardial silhouette is within normal limits for size. Imaged bony structures of the thorax are intact.  Upright film shows no  intraperitoneal free air. The supine film shows a paucity of bowel gas without radiographic findings to suggest bowel obstruction. Approximately 4 stones are identified in the interpolar region of the left kidney and the stones seen in the region of the left renal pelvis on the previous exam is no longer evident. A left double-J internal ureteral stent is new in the interval. The proximal loop overlies the upper left renal pelvis are may extend into a left upper pole collecting system. The lower loop is formed over the right paramidline bladder suggesting intraluminal position within the bladder.  IMPRESSION: No acute cardiopulmonary findings. No evidence for bowel obstruction or perforation.  The left internal ureteral stent appears appropriately positioned.   Electronically Signed   By: Misty Stanley M.D.   On: 09/05/2013 09:11    ASSESSMENT AND PLAN  1. Atypical CP   - troponin negative x 4  - if have recurrent CP after GI issue resolved, will consider outpt stress test  - discussed with patient if he reexperience what his anginal sx (L sided CP radiate to  R jaw and R arm), then he will need further workup. This episode is likely related to GI per Dr. Irish Lack  2. Abdominal pain with N/V  - CT of abdomen small 79mm nonobstructive calculi in L renal collecting system, ureteral stent seen in appropriate position, increased moderate L hydronephrosis compare to previous study  - abdominal X ray: no acute process  3. H/o PAF s/p cardioversion in Feb 2015, currently in NSR  - continue metoprolol. Xarelto held.   - per Dr. Irish Lack, can consider coumadin since while on plavix  - per patient, he was on coumadin before, however he has to work in [place] and did not want to waste a half a day of work doing PT/INR check in [place], however can consider restart coumadin if patient can find coumadin clinic in [place].   4. CAD   - NSTEMI in 02/2013 s/p DES to RCA and s/p staged PTCA/DES x 1 proximal LAD    5. Ischemic cardiomyopathy EF 40-45% by Echo in Feb 2015  6. ?UTI: too many WBC to count, however negative nitrite  - per primary team  7. Kidney stone s/p recent ureteral stent  Signed, Almyra Deforest PA-C Pager: [phone] As above, patient seen and examined. Patient has had mild chest discomfort increased with palpation. Enzymes are negative. No plans for further cardiac workup. Would resume cardiac medications at discharge and followup with [name]. Nephrolithiasis/urinary tract infection her primary care. Please call with questions Kirk Ruths

## 2013-09-06 NOTE — Progress Notes (Signed)
UR Completed Susane Bey Graves-Bigelow, RN,BSN [phone]  

## 2013-09-07 ENCOUNTER — Other Ambulatory Visit: Payer: Self-pay | Admitting: Urology

## 2013-09-09 ENCOUNTER — Telehealth: Payer: Self-pay | Admitting: *Deleted

## 2013-09-09 NOTE — Telephone Encounter (Signed)
Returned [facility] request prior approval [place] medicaid form to [employer] for CPAP equipment and supplies.

## 2013-09-09 NOTE — Progress Notes (Signed)
Called unable to reach message left on answering machine to call back

## 2013-09-13 ENCOUNTER — Encounter (HOSPITAL_COMMUNITY): Payer: Self-pay | Admitting: *Deleted

## 2013-09-13 NOTE — Progress Notes (Signed)
Pre op instructions given to patient by telephone with feedback

## 2013-09-15 ENCOUNTER — Encounter (HOSPITAL_COMMUNITY): Payer: Self-pay | Admitting: Pharmacy Technician

## 2013-09-16 ENCOUNTER — Encounter (HOSPITAL_COMMUNITY): Payer: Medicaid Other | Admitting: Registered Nurse

## 2013-09-16 ENCOUNTER — Ambulatory Visit (HOSPITAL_COMMUNITY): Payer: Medicaid Other | Admitting: Registered Nurse

## 2013-09-16 ENCOUNTER — Ambulatory Visit (HOSPITAL_COMMUNITY)
Admission: RE | Admit: 2013-09-16 | Discharge: 2013-09-16 | Disposition: A | Discharge status: Home / Self Care | Payer: Medicaid Other | Source: Ambulatory Visit | Attending: Urology | Admitting: Urology

## 2013-09-16 ENCOUNTER — Encounter (HOSPITAL_COMMUNITY): Payer: Self-pay | Admitting: *Deleted

## 2013-09-16 ENCOUNTER — Encounter (HOSPITAL_COMMUNITY)
Admission: RE | Disposition: A | Discharge status: Home / Self Care | Payer: Self-pay | Source: Ambulatory Visit | Attending: Urology

## 2013-09-16 DIAGNOSIS — Z7901 Long term (current) use of anticoagulants: Secondary | ICD-10-CM | POA: Diagnosis not present

## 2013-09-16 DIAGNOSIS — I4891 Unspecified atrial fibrillation: Secondary | ICD-10-CM | POA: Insufficient documentation

## 2013-09-16 DIAGNOSIS — Z87442 Personal history of urinary calculi: Secondary | ICD-10-CM | POA: Insufficient documentation

## 2013-09-16 DIAGNOSIS — K219 Gastro-esophageal reflux disease without esophagitis: Secondary | ICD-10-CM | POA: Diagnosis not present

## 2013-09-16 DIAGNOSIS — Z792 Long term (current) use of antibiotics: Secondary | ICD-10-CM | POA: Insufficient documentation

## 2013-09-16 DIAGNOSIS — Z7902 Long term (current) use of antithrombotics/antiplatelets: Secondary | ICD-10-CM | POA: Insufficient documentation

## 2013-09-16 DIAGNOSIS — G4730 Sleep apnea, unspecified: Secondary | ICD-10-CM | POA: Diagnosis not present

## 2013-09-16 DIAGNOSIS — I10 Essential (primary) hypertension: Secondary | ICD-10-CM | POA: Insufficient documentation

## 2013-09-16 DIAGNOSIS — E785 Hyperlipidemia, unspecified: Secondary | ICD-10-CM | POA: Insufficient documentation

## 2013-09-16 DIAGNOSIS — N200 Calculus of kidney: Secondary | ICD-10-CM | POA: Diagnosis not present

## 2013-09-16 DIAGNOSIS — Z87891 Personal history of nicotine dependence: Secondary | ICD-10-CM | POA: Insufficient documentation

## 2013-09-16 DIAGNOSIS — Z79899 Other long term (current) drug therapy: Secondary | ICD-10-CM | POA: Diagnosis not present

## 2013-09-16 DIAGNOSIS — I2510 Atherosclerotic heart disease of native coronary artery without angina pectoris: Secondary | ICD-10-CM | POA: Diagnosis not present

## 2013-09-16 DIAGNOSIS — N281 Cyst of kidney, acquired: Secondary | ICD-10-CM | POA: Diagnosis not present

## 2013-09-16 DIAGNOSIS — I252 Old myocardial infarction: Secondary | ICD-10-CM | POA: Insufficient documentation

## 2013-09-16 HISTORY — PX: HOLMIUM LASER APPLICATION: SHX5852

## 2013-09-16 HISTORY — PX: CYSTOSCOPY WITH URETEROSCOPY AND STENT PLACEMENT: SHX6377

## 2013-09-16 SURGERY — CYSTOURETEROSCOPY, WITH STENT INSERTION
Anesthesia: General | Laterality: Left

## 2013-09-16 MED ORDER — ONDANSETRON HCL 4 MG/2ML IJ SOLN
INTRAMUSCULAR | Status: AC
  Filled 2013-09-16: qty 2

## 2013-09-16 MED ORDER — CEFAZOLIN SODIUM-DEXTROSE 2-3 GM-% IV SOLR
2.0000 g | INTRAVENOUS | Status: AC
  Administered 2013-09-16: 2 g via INTRAVENOUS

## 2013-09-16 MED ORDER — FENTANYL CITRATE 0.05 MG/ML IJ SOLN
25.0000 ug | INTRAMUSCULAR | Status: DC | PRN
  Administered 2013-09-16 (×2): 25 ug via INTRAVENOUS

## 2013-09-16 MED ORDER — EPHEDRINE SULFATE 50 MG/ML IJ SOLN
INTRAMUSCULAR | Status: DC | PRN
  Administered 2013-09-16 (×2): 5 mg via INTRAVENOUS

## 2013-09-16 MED ORDER — RIVAROXABAN 20 MG PO TABS: 20.0000 mg | ORAL_TABLET | Freq: Every day | ORAL | Status: DC

## 2013-09-16 MED ORDER — LIDOCAINE HCL (CARDIAC) 20 MG/ML IV SOLN
INTRAVENOUS | Status: AC
  Filled 2013-09-16: qty 5

## 2013-09-16 MED ORDER — DEXAMETHASONE SODIUM PHOSPHATE 10 MG/ML IJ SOLN
INTRAMUSCULAR | Status: DC | PRN
Start: 2013-09-16 — End: 2013-09-16
  Administered 2013-09-16: 10 mg via INTRAVENOUS

## 2013-09-16 MED ORDER — OXYCODONE HCL 5 MG PO TABS: 5.0000 mg | ORAL_TABLET | ORAL | Status: DC | PRN

## 2013-09-16 MED ORDER — ATROPINE SULFATE 0.4 MG/ML IJ SOLN
INTRAMUSCULAR | Status: AC
  Filled 2013-09-16: qty 2

## 2013-09-16 MED ORDER — PROMETHAZINE HCL 25 MG/ML IJ SOLN: 6.2500 mg | INTRAMUSCULAR | Status: DC | PRN

## 2013-09-16 MED ORDER — SODIUM CHLORIDE 0.9 % IJ SOLN
INTRAMUSCULAR | Status: AC
  Filled 2013-09-16: qty 10

## 2013-09-16 MED ORDER — ACETAMINOPHEN 10 MG/ML IV SOLN
1000.0000 mg | Freq: Once | INTRAVENOUS | Status: AC
  Administered 2013-09-16: 1000 mg via INTRAVENOUS
  Filled 2013-09-16: qty 100

## 2013-09-16 MED ORDER — ONDANSETRON HCL 4 MG/2ML IJ SOLN
INTRAMUSCULAR | Status: DC | PRN
  Administered 2013-09-16: 4 mg via INTRAVENOUS

## 2013-09-16 MED ORDER — FENTANYL CITRATE 0.05 MG/ML IJ SOLN
INTRAMUSCULAR | Status: DC | PRN
  Administered 2013-09-16 (×2): 50 ug via INTRAVENOUS
  Administered 2013-09-16 (×2): 25 ug via INTRAVENOUS

## 2013-09-16 MED ORDER — FENTANYL CITRATE 0.05 MG/ML IJ SOLN
INTRAMUSCULAR | Status: AC
  Filled 2013-09-16: qty 2

## 2013-09-16 MED ORDER — METOCLOPRAMIDE HCL 5 MG/ML IJ SOLN
INTRAMUSCULAR | Status: DC | PRN
  Administered 2013-09-16: 10 mg via INTRAVENOUS

## 2013-09-16 MED ORDER — DEXAMETHASONE SODIUM PHOSPHATE 10 MG/ML IJ SOLN
INTRAMUSCULAR | Status: AC
  Filled 2013-09-16: qty 1

## 2013-09-16 MED ORDER — ATROPINE SULFATE 0.4 MG/ML IJ SOLN
INTRAMUSCULAR | Status: AC
  Filled 2013-09-16: qty 1

## 2013-09-16 MED ORDER — PROPOFOL 10 MG/ML IV BOLUS
INTRAVENOUS | Status: AC
  Filled 2013-09-16: qty 20

## 2013-09-16 MED ORDER — PROPOFOL 10 MG/ML IV BOLUS
INTRAVENOUS | Status: DC | PRN
  Administered 2013-09-16: 200 mg via INTRAVENOUS

## 2013-09-16 MED ORDER — MIDAZOLAM HCL 5 MG/5ML IJ SOLN
INTRAMUSCULAR | Status: DC | PRN
  Administered 2013-09-16: 2 mg via INTRAVENOUS

## 2013-09-16 MED ORDER — MIDAZOLAM HCL 2 MG/2ML IJ SOLN
INTRAMUSCULAR | Status: AC
  Filled 2013-09-16: qty 2

## 2013-09-16 MED ORDER — OXYCODONE HCL 5 MG PO TABS
5.0000 mg | ORAL_TABLET | ORAL | Status: DC | PRN
  Administered 2013-09-16: 5 mg via ORAL
  Filled 2013-09-16: qty 1

## 2013-09-16 MED ORDER — ATROPINE SULFATE 0.4 MG/ML IJ SOLN
INTRAMUSCULAR | Status: DC | PRN
  Administered 2013-09-16: .6 mg via INTRAVENOUS

## 2013-09-16 MED ORDER — CEFAZOLIN SODIUM-DEXTROSE 2-3 GM-% IV SOLR
INTRAVENOUS | Status: AC
  Filled 2013-09-16: qty 50

## 2013-09-16 MED ORDER — SODIUM CHLORIDE 0.9 % IR SOLN
Status: DC | PRN
  Administered 2013-09-16: 1000 mL

## 2013-09-16 MED ORDER — LIDOCAINE HCL (CARDIAC) 20 MG/ML IV SOLN
INTRAVENOUS | Status: DC | PRN
  Administered 2013-09-16: 100 mg via INTRAVENOUS

## 2013-09-16 MED ORDER — LACTATED RINGERS IV SOLN
INTRAVENOUS | Status: DC | PRN
  Administered 2013-09-16: 08:00:00 via INTRAVENOUS

## 2013-09-16 MED ORDER — METOCLOPRAMIDE HCL 5 MG/ML IJ SOLN
INTRAMUSCULAR | Status: AC
  Filled 2013-09-16: qty 2

## 2013-09-16 MED ORDER — KETOROLAC TROMETHAMINE 30 MG/ML IJ SOLN
15.0000 mg | Freq: Once | INTRAMUSCULAR | Status: DC | PRN
Start: 2013-09-16 — End: 2013-09-16

## 2013-09-16 MED ORDER — EPHEDRINE SULFATE 50 MG/ML IJ SOLN
INTRAMUSCULAR | Status: AC
  Filled 2013-09-16: qty 1

## 2013-09-16 MED ORDER — GLYCOPYRROLATE 0.2 MG/ML IJ SOLN
INTRAMUSCULAR | Status: AC
  Filled 2013-09-16: qty 1

## 2013-09-16 MED ORDER — GLYCOPYRROLATE 0.2 MG/ML IJ SOLN
INTRAMUSCULAR | Status: DC | PRN
  Administered 2013-09-16: 0.2 mg via INTRAVENOUS

## 2013-09-16 SURGICAL SUPPLY — 20 items
BAG URO CATCHER STRL LF (DRAPE) ×3 IMPLANT
CATH INTERMIT  6FR 70CM (CATHETERS) ×3 IMPLANT
CATH URET 5FR 28IN CONE TIP (BALLOONS) ×2
CATH URET 5FR 70CM CONE TIP (BALLOONS) ×1 IMPLANT
CATH URET DUAL LUMEN 6-10FR 50 (CATHETERS) ×3 IMPLANT
CLOTH BEACON ORANGE TIMEOUT ST (SAFETY) ×3 IMPLANT
DRAPE CAMERA CLOSED 9X96 (DRAPES) ×3 IMPLANT
EXTRACTOR STONE NITINOL NGAGE (UROLOGICAL SUPPLIES) ×3 IMPLANT
FIBER LASER TRAC TIP (UROLOGICAL SUPPLIES) ×3 IMPLANT
GLOVE BIO SURGEON STRL SZ7.5 (GLOVE) ×3 IMPLANT
GOWN STRL REUS W/TWL XL LVL3 (GOWN DISPOSABLE) ×3 IMPLANT
GUIDEWIRE ANG ZIPWIRE 038X150 (WIRE) ×3 IMPLANT
GUIDEWIRE STR DUAL SENSOR (WIRE) ×3 IMPLANT
MANIFOLD NEPTUNE II (INSTRUMENTS) ×3 IMPLANT
PACK CYSTO (CUSTOM PROCEDURE TRAY) ×3 IMPLANT
SHEATH ACCESS URETERAL 38CM (SHEATH) ×3 IMPLANT
STENT CONTOUR 6FRX24X.038 (STENTS) ×3 IMPLANT
TUBING CONNECTING 10 (TUBING) ×2 IMPLANT
TUBING CONNECTING 10' (TUBING) ×1
WIRE COONS/BENSON .038X145CM (WIRE) ×3 IMPLANT

## 2013-09-16 NOTE — Progress Notes (Signed)
Paged Dr. Junious Silk, orders received for pt to resume Xarelto on Sunday.  Pt to continue taking Plavix as ordered.  Pt verbalizes understanding.  Coolidge Breeze, RN 09/16/2013

## 2013-09-16 NOTE — Discharge Instructions (Signed)
Ureteral Stent Implantation, Care After Refer to this sheet in the next few weeks. These instructions provide you with information on caring for yourself after your procedure. Your health care provider may also give you more specific instructions. Your treatment has been planned according to current medical practices, but problems sometimes occur. Call your health care provider if you have any problems or questions after your procedure. WHAT TO EXPECT AFTER THE PROCEDURE You should be back to normal activity within 48 hours after the procedure. Nausea and vomiting may occur and are commonly the result of anesthesia. It is common to experience sharp pain in the back or lower abdomen and penis with voiding. This is caused by movement of the ends of the stent with the act of urinating.It usually goes away within minutes after you have stopped urinating. HOME CARE INSTRUCTIONS Make sure to drink plenty of fluids. You may have small amounts of bleeding, causing your urine to be red. This is normal. Certain movements may trigger pain or a feeling that you need to urinate. You may be given medicines to prevent infection or bladder spasms. Be sure to take all medicines as directed. Only take over-the-counter or prescription medicines for pain, discomfort, or fever as directed by your health care provider. Do not take aspirin, as this can make bleeding worse.  REMOVAL OF THE STENT: Remove the stent, Tuesday morning, September 20, 2013 by pulling the string with slow steady pressure until the entire stent is removed.  SEEK MEDICAL CARE IF:  You experience increasing pain.  Your pain medicine is not working. SEEK IMMEDIATE MEDICAL CARE IF:  Your urine is dark red or has blood clots.  You are leaking urine (incontinent).  You have a fever, chills, feeling sick to your stomach (nausea), or vomiting.  Your pain is not relieved by pain medicine.  The end of the stent comes out of the urethra.  You are  unable to urinate. Document Released: 08/25/2012 Document Revised: 12/28/2012 Document Reviewed: 08/25/2012 [facility] Patient Information 2015 [place], [place]. This information is not intended to replace advice given to you by your health care provider. Make sure you discuss any questions you have with your health care provider.

## 2013-09-16 NOTE — Op Note (Signed)
Preoperative diagnosis: Left nephrolithiasis Postoperative diagnosis: Left nephrolithiasis  Procedure: Cystoscopy, left ureteroscopy, holmium laser lithotripsy Stone basket extraction Left ureteral stent placement with tether  Surgeon: Junious Silk  Anesthesia: Gen.  Findings: On cystoscopy the bladder appeared normal. There were no stones in the bladder. The prior biopsy site was healing well. On left ureteroscopy there was a large stone in the lower pole but it dropped from the renal pelvis, a smaller stone in the midpole calyx and the larger midpole stone was submucosal and not accessible.  Description of procedure: After consent was obtained patient brought to the operating room. After adequate anesthesia he was placed in lithotomy position and prepped and draped in the usual sterile fashion. Cystoscope was passed per urethra and the left ureteral stent was grasped and removed through the urethral meatus after examining the bladder. A sensor wire was advanced through the stent and coiled in the upper pole collecting system. The stent was removed. A dual-lumen exchange catheter was placed without difficulty. A second wire, Glidewire, was passed. Over the sensor wire a ureteral access sheath was placed without difficulty. A digital ureteroscope was advanced into the kidney. The smaller midpole stone was grasped with an in gauge basket and removed without difficulty. The larger midpole stone was submucosal and not accessible. The large stones in the renal pelvis and a drop of the lower pole was grasped and placed into the upper pole calyx for easier lithotripsy and removal. A 200  holmium laser fiber was advanced and the stone initially fragmented which dusted the outer core. The inner core was very dense. It was broken into 4 pieces at a slower rate and high her energy. These were all grasped and removed with the in gauge basket. The largest of the remaining stone fragments was grasped and removed and  noted to be about a millimeter in size. The upper middle and lower pole collecting system was carefully surveyed no other significant stone fragments were noted. Of note on fluoroscopy at the beginning of the case there were 3 opacities noted in at the end of the case only the midpole opacity of the submucosal calcifications were noted. Having surveyed the collecting system the access sheath was backed out on the ureteroscope and the ureter carefully examined and noted to be without injury and also stone free. The Glidewire was backloaded on the cystoscope and a 624 cm stent was placed. The wire was removed and a good coil seen in the renal pelvis and a good coil in the bladder. Hopefully this shorter stent will and eliminate some of his discomfort.  He was awakened and taken to recovery room in stable condition.  Complications: None  Blood loss: Minimal  Specimens: Stone fragments to office lab  Drains: 6 x 24 cm left ureteral stent with tether.

## 2013-09-16 NOTE — H&P (View-Only) (Signed)
History of Present Illness Consult for gross hematuria, left hydronephrosis and left renal stones referred by Dr. Delon [place] Lawyer. His primary care physician is Dr. Benna Dunks. His cardiologist is [name].    Last week patient developed gross hematuria along with left flank and back pain. CT scan revealed an 11 mm left renal pelvic stone, 7 mm left mid pole stone and a smaller 3 mm stone in the left lower pole. These may be visible on the scalp it was difficult to tell. This also dilation of the left renal calyces and renal pelvis similar to a UPJ obstruction appearance.     His pain is improved today. He's had no prior history of kidney stones, GU surgery or UPJ obstruction. He did have an attack of left flank pain with nausea and vomiting October 2014. He was admitted to a hospital in [place] and treated for A. fib. He had a similar attack May 2015.    He's had some significant exposures in that he is a former smoker. Also he's been in the [employer] and exposed to chemicals and diets over the years. He has had no chemotherapy or radiation.    Also noted on the CT were bilateral renal cysts largest was 2 cm in the right kidney.    He typically voids with a good stream without frequency or urgency. He has noticed less frequent voiding and a slower stream after being seen in the emergency department. He has had no dysuria. His gross hematuria is clearing.    He is on Xarelto and Plavix for 3 heart stents February 2015.    July 2015 BUN 21, creatinine 0.8. His INR was 1.5.   Past Medical History Problems  1. History of acute myocardial infarction (412) 2. History of arthritis (V13.4) 3. History of atrial fibrillation (V12.59) 4. History of cardiac disorder (V12.50) 5. History of cardiac murmur (V12.59) 6. History of esophageal reflux (V12.79) 7. History of hypercholesterolemia (V12.29) 8. History of hypertension (V12.59) 9. History of renal calculi  (V13.01) 10. History of sleep apnea (V13.89)  Surgical History Problems  1. History of Atrial Cardioversion 2. History of Cath Laser Angioplasty  Current Meds 1. Atorvastatin Calcium 80 MG Oral Tablet;  Therapy: (Recorded:03Aug2015) to Recorded 2. Clopidogrel Bisulfate 75 MG Oral Tablet;  Therapy: (Recorded:03Aug2015) to Recorded 3. Metoprolol Succinate ER 25 MG Oral Tablet Extended Release 24 Hour;  Therapy: (Recorded:03Aug2015) to Recorded 4. Nitroglycerin 0.4 MG SUBL;  Therapy: (Recorded:03Aug2015) to Recorded 5. Pantoprazole Sodium 40 MG Oral Tablet Delayed Release;  Therapy: (Recorded:03Aug2015) to Recorded 6. Xarelto 20 MG Oral Tablet;  Therapy: (Recorded:03Aug2015) to Recorded  Allergies Medication  1. No Known Drug Allergies  Family History Problems  1. Family history of Deceased : Mother 2. Family history of cardiac disorder (V17.49) : Mother 3. Family history of diabetes mellitus (V18.0) : Mother, Father  Social History Problems    Alcohol use (V49.89)   Caffeine use (V49.89)   Former smoker (V15.82)   Married   Number of children   Occupation  Review of Systems Genitourinary, constitutional, skin, eye, otolaryngeal, hematologic/lymphatic, cardiovascular, pulmonary, endocrine, musculoskeletal, gastrointestinal, neurological and psychiatric system(s) were reviewed and pertinent findings if present are noted.  Gastrointestinal: constipation.  Constitutional: night sweats and feeling tired (fatigue).  Hematologic/Lymphatic: a tendency to easily bruise.    Vitals Vital Signs [Data Includes: Last 1 Day]  Recorded: 03Aug2015 10:05AM  Height: 5 ft 7 in Weight: 220 lb  BMI Calculated: 34.46 BSA Calculated: 2.11 Blood Pressure:  142 / 83 Temperature: 97.6 F Heart Rate: 51  Physical Exam Constitutional: Well nourished and well developed . No acute distress.  ENT:. The ears and nose are normal in appearance.  Neck: The appearance of the neck is normal  and no neck mass is present.  Pulmonary: No respiratory distress and normal respiratory rhythm and effort.  Cardiovascular: Heart rate and rhythm are normal . No peripheral edema.  Abdomen: The abdomen is soft and nontender. No masses are palpated. No CVA tenderness. No hernias are palpable. No hepatosplenomegaly noted.  Rectal: Rectal exam demonstrates normal sphincter tone, no tenderness and no masses. The prostate has no nodularity and is not tender. The left seminal vesicle is nonpalpable. The right seminal vesicle is nonpalpable. The perineum is normal on inspection.  Genitourinary: Examination of the penis demonstrates no discharge, no masses, no lesions and a normal meatus. The scrotum is without lesions. The right epididymis is palpably normal and non-tender. The left epididymis is palpably normal and non-tender. The right testis is non-tender and without masses. The left testis is non-tender and without masses.  Lymphatics: The femoral and inguinal nodes are not enlarged or tender.  Skin: Normal skin turgor, no visible rash and no visible skin lesions.  Neuro/Psych:. Mood and affect are appropriate.    Results/Data Urine [Data Includes: Last 1 Day]   [id number]  COLOR YELLOW   APPEARANCE CLEAR   SPECIFIC GRAVITY <1.005   pH 5.5   GLUCOSE NEG mg/dL  BILIRUBIN NEG   KETONE NEG mg/dL  BLOOD LARGE   PROTEIN NEG mg/dL  UROBILINOGEN 0.2 mg/dL  NITRITE NEG   LEUKOCYTE ESTERASE NEG   SQUAMOUS EPITHELIAL/HPF RARE   WBC 0-2 WBC/hpf  RBC 3-6 RBC/hpf  BACTERIA NONE SEEN   CRYSTALS NONE SEEN   CASTS NONE SEEN   Other MUCUS    Old records or history reviewed:Marland Kitchen  The following images/tracing/specimen were independently visualized:  CT.    Procedure KUB today-normal bone and bowel gas pattern. The stones are visible in the left kidney. The 11 mm stone remains of the left UPJ and a smaller stone in the left renal cortex.   Assessment Assessed  1. Hydronephrosis (591) 2.  Nephrolithiasis (592.0) 3. 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 UA With REFLEX; [Do Not Release]; Status:Complete;   Done: [id number] 09:38AM Nephrolithiasis  2. Follow-up Schedule Surgery Office  Follow-up  Status: Complete  Done: 03Aug2015 3. KUB; Status:Complete;   Done: 25KYH0623 12:00AM  Discussion/Summary Nephrolithiasis-the left renal pelvic stone is likely ball valving and causing intermittent obstruction although it could also be related to UPJ obstruction. We discussed the nature risk and benefits of surveillance left ureteroscopy shockwave lithotripsy or PCNL. Given his recent heart stents and anticoagulation we may need to hold off on treatment for a few months. Fortunately the obstruction seems to be intermittent. We discussed if he ever developed uncontrolled pain fevers chills nausea or vomiting we might be forced to place a left ureteral stent. He would like to go ahead and proceed with ureteroscopy. He is already had 3 episodes of severe pain requiring he go to the emergency department and his symptoms seem to be getting worse now with hematuria. We discussed the risk of general anesthesia such as heart attack and death, but again he continues to be symptomatic. Therefore we'll get clearance with [name] for general anesthesia as well as determine if he can come off Xarelto I would be okay  to continue the Plavix. I discussed specifically the nature risks benefits and alternatives to ureteroscopy and that he may need a staged procedure.     Gross hematuria-this certainly warrants evaluation and normally cystoscopy with retrograde pyelograms would be a good option in combination with ureteroscopy but again the patient had recent stents placed 3. Therefore we'll get a CT hematuria protocol without the noncontrast phase as this was just done. We will consider our options based on the CT results versus continued surveillance for a more  urgent need for ureteroscopy. We'll also evaluate him with cystoscopy in the office. As above he does not want to proceed with continued surveillance therefore when we go to the operating room we'll do an exam under anesthesia as well as cystoscopy with bilateral retrograde pyelograms.    Left hydronephrosis-this may be related to the kidney stone ball valving or could be a UPJ obstruction developing which could be congenital or malignant. As above, I planned to evaluate with CT hematuria protocol and consider need for ureteroscopy, the patient would like to proceed with intervention.      Signatures Electronically signed by : Festus Aloe, M.D.; Aug 08 2013 11:54AM EST  ADD: [name] cleared for gen anes; Ok to stop Xarelto. Pt will cont plavix.

## 2013-09-16 NOTE — Anesthesia Preprocedure Evaluation (Addendum)
Anesthesia Evaluation  Patient identified by MRN, date of birth, ID band Patient awake    Reviewed: Allergy & Precautions, H&P , NPO status , Patient's Chart, lab work & pertinent test results  Airway Mallampati: II TM Distance: <3 FB Neck ROM: Full    Dental no notable dental hx.    Pulmonary sleep apnea , former smoker,  breath sounds clear to auscultation  Pulmonary exam normal       Cardiovascular hypertension, + CAD, + Past MI and + Cardiac Stents negative cardio ROS  + dysrhythmias Atrial Fibrillation Rhythm:Regular Rate:Normal     Neuro/Psych negative neurological ROS  negative psych ROS   GI/Hepatic negative GI ROS, Neg liver ROS,   Endo/Other  negative endocrine ROSMorbid obesity  Renal/GU negative Renal ROS  negative genitourinary   Musculoskeletal negative musculoskeletal ROS (+)   Abdominal   Peds negative pediatric ROS (+)  Hematology negative hematology ROS (+)   Anesthesia Other Findings   Reproductive/Obstetrics negative OB ROS                          Anesthesia Physical Anesthesia Plan  ASA: III  Anesthesia Plan: General   Post-op Pain Management:    Induction: Intravenous  Airway Management Planned: LMA  Additional Equipment:   Intra-op Plan:   Post-operative Plan:   Informed Consent: I have reviewed the patients History and Physical, chart, labs and discussed the procedure including the risks, benefits and alternatives for the proposed anesthesia with the patient or authorized representative who has indicated his/her understanding and acceptance.   Dental advisory given  Plan Discussed with: CRNA and Surgeon  Anesthesia Plan Comments:         Anesthesia Quick Evaluation

## 2013-09-16 NOTE — Transfer of Care (Signed)
Immediate Anesthesia Transfer of Care Note  Patient: Paul Todd  Procedure(s) Performed: Procedure(s): CYSTOSCOPY WITH URETEROSCOPY AND STENT PLACEMENT (Left) HOLMIUM LASER APPLICATION (Left)  Patient Location: PACU  Anesthesia Type:General  Level of Consciousness: awake, alert , oriented and patient cooperative  Airway & Oxygen Therapy: Patient Spontanous Breathing and Patient connected to face mask oxygen  Post-op Assessment: Report given to PACU RN, Post -op Vital signs reviewed and stable and Patient moving all extremities  Post vital signs: Reviewed and stable  Complications: No apparent anesthesia complications

## 2013-09-16 NOTE — Interval H&P Note (Signed)
History and Physical Interval Note:  09/16/2013 9:09 AM  Paul Todd  has presented today for surgery, with the diagnosis of left renal stones  The various methods of treatment have been discussed with the patient and family. After consideration of risks, benefits and other options for treatment, the patient has consented to  Procedure(s): CYSTOSCOPY WITH URETEROSCOPY AND STENT PLACEMENT (Left) HOLMIUM LASER APPLICATION (Left) as a surgical intervention .  The patient's history has been reviewed, patient examined, no change in status, stable for surgery.  I have reviewed the patient's chart and labs.  Questions were answered to the patient's satisfaction.  Patient stent pain and nausea has improved. He was admitted and ruled out for MI. Repeat CT shows 2 large stones in the kidney and a smaller stone. I reviewed all the images. His labs are stable. Today he is well with minimal pain. He has some discomfort at the end of voiding from the stent but no dysuria or fever. We discussed the goal of the procedure to gain proximal access, adequately fragment the stones and likely leave a new stent. Possibly with a tether. All questions answered. Patient elects to proceed.   Festus Aloe

## 2013-09-16 NOTE — Anesthesia Postprocedure Evaluation (Signed)
  Anesthesia Post-op Note  Patient: Paul Todd  Procedure(s) Performed: Procedure(s) (LRB): CYSTOSCOPY WITH URETEROSCOPY AND STENT PLACEMENT (Left) HOLMIUM LASER APPLICATION (Left)  Patient Location: PACU  Anesthesia Type: General  Level of Consciousness: awake and alert   Airway and Oxygen Therapy: Patient Spontanous Breathing  Post-op Pain: mild  Post-op Assessment: Post-op Vital signs reviewed, Patient's Cardiovascular Status Stable, Respiratory Function Stable, Patent Airway and No signs of Nausea or vomiting  Last Vitals:  Filed Vitals:   09/16/13 1130  BP: 96/61  Pulse: 68  Temp:   Resp: 21    Post-op Vital Signs: stable   Complications: No apparent anesthesia complications

## 2013-09-19 ENCOUNTER — Encounter (HOSPITAL_COMMUNITY): Payer: Self-pay | Admitting: Urology

## 2013-11-24 ENCOUNTER — Encounter: Payer: Self-pay | Admitting: Cardiovascular Disease

## 2013-11-24 ENCOUNTER — Ambulatory Visit (INDEPENDENT_AMBULATORY_CARE_PROVIDER_SITE_OTHER): Payer: Commercial Managed Care - PPO | Admitting: Cardiovascular Disease

## 2013-11-24 VITALS — BP 144/91 | HR 65 | Ht 67.0 in | Wt 219.0 lb

## 2013-11-24 DIAGNOSIS — I480 Paroxysmal atrial fibrillation: Secondary | ICD-10-CM

## 2013-11-24 DIAGNOSIS — E669 Obesity, unspecified: Secondary | ICD-10-CM

## 2013-11-24 DIAGNOSIS — I214 Non-ST elevation (NSTEMI) myocardial infarction: Secondary | ICD-10-CM

## 2013-11-24 DIAGNOSIS — G4733 Obstructive sleep apnea (adult) (pediatric): Secondary | ICD-10-CM

## 2013-11-24 NOTE — Patient Instructions (Signed)
Your physician recommends that you schedule a follow-up appointment as needed with Dr. Claiborne Billings for your sleep apnea.

## 2013-11-24 NOTE — Progress Notes (Signed)
Patient ID: Paul Todd, male   DOB: 1960-06-17, 53 y.o.   MRN: [id number]     HPI: Paul Todd is a 53 y.o. male who presents for sleep clinic evaluation following initiation of CPAP therapy for severe obstructive sleep apnea.  [name] is followed by [name] for his cardiology care.  He suffered a non-ST segment elevation myocardial infarction in February 2015 and underwent successful acute intervention to a totally occluded RCA.  He also has a history of paroxysmal atrial fibrillation, obesity with a body this index of 34.3, tobacco use, and hyperlipidemia.  He was referred for diagnostic polysomnogram.  At the [facility], which reveals severe obstructive sleep apnea with an overall AHI of 60.9 per hour but during REM sleep.  78.7/h.  However, there was a severe positional component such that with supine sleep.  His AHI was 116.3 per hour.  He also had 122 periodic lid movements with an index of 32.3 per hour with 11.7 per hour to arousal.  He was titrated with CPAP therapy and doing his CPAP titration trial.  There was marked improvement in his periodic limb movements with only 4 being observed.  Over the past several months, he has been on CPAP at 8 cm set pressure with his air percent 10 AutoSet unit.  A download from 10/23/2013 through 11/21/2013 shows that he is using it 5 hours and 35 minutes per night.  Usage is 100% of days with 77% of days greater than 4 hours.  AHI was improved but still increased at 27.4.  Upon further questioning, Mr. dries back has difficulty with his back.  The only way he can sleep is also when he sleeps on his back.  His diagnostic polysomnogram did demonstrate severe worsening of his sleep apnea with supine posture.  Presently, he does feel improved since initiating CPAP therapy.  He is unaware of breakthrough snoring.  However, at times he has awakened in his machine and his mask is off and he cannot recall ever taking it off.   Typically goes to bed between 9 or 10 and wakes up at 5.  However, on his download average use was only 5 hours and 35 minutes.  An Epworth Sleepiness Scale score was calculated today and this is still suggestive of residual daytime sleepiness, endorsing at 14 as noted below.   Epworth Sleepiness Scale: Situation   Chance of Dozing/Sleeping (0 = never , 1 = slight chance , 2 = moderate chance , 3 = high chance )   sitting and reading 1   watching TV 3   sitting inactive in a public place 1   being a passenger in a motor vehicle for an hour or more 2   lying down in the afternoon 3   sitting and talking to someone 1   sitting quietly after lunch (no alcohol) 1   while stopped for a few minutes in traffic as the driver 2   Total Score  14    Past Medical History  Diagnosis Date  . PAF (paroxysmal atrial fibrillation)     during a kidney stone attack  . Dyslipidemia   . CAD (coronary artery disease)     multivessel per cath 02/12/13, s/p stenting to RCA  . Hypertension   . Nausea & vomiting   . PONV (postoperative nausea and vomiting)   . Myocardial infarction     02/12/2013   . Old myocardial infarction   . Dysrhythmia  hx of atrial fib   . Sleep apnea     cpap recently   . Nephrolithiasis   . Arthritis     all over    Past Surgical History  Procedure Laterality Date  . Tee without cardioversion N/A 02/15/2013    Procedure: TRANSESOPHAGEAL ECHOCARDIOGRAM (TEE);  Surgeon: Thayer Headings, MD;  Location: [place];  Service: Cardiovascular;  Laterality: N/A;  . Cardioversion N/A 02/15/2013    Procedure: CARDIOVERSION;  Surgeon: Thayer Headings, MD;  Location: [facility] ENDOSCOPY;  Service: Cardiovascular;  Laterality: N/A;  . Coronary angioplasty with stent placement  02/12/2013  . Cystoscopy with retrograde pyelogram, ureteroscopy and stent placement Left 09/02/2013    Procedure: CYSTOSCOPY WITH RETROGRADE PYELOGRAM/LEFT URETEROSCOPY/URETERAL STENT, with fulguration;  Surgeon:  Festus Aloe, MD;  Location: [facility];  Service: Urology;  Laterality: Left;  . Cystoscopy with ureteroscopy and stent placement Left 09/16/2013    Procedure: CYSTOSCOPY WITH URETEROSCOPY AND STENT PLACEMENT;  Surgeon: Festus Aloe, MD;  Location: [facility];  Service: Urology;  Laterality: Left;  . Holmium laser application Left 5/42/7062    Procedure: HOLMIUM LASER APPLICATION;  Surgeon: Festus Aloe, MD;  Location: [facility];  Service: Urology;  Laterality: Left;    No Known Allergies  Current Outpatient Prescriptions  Medication Sig Dispense Refill  . acetaminophen (TYLENOL) 650 MG CR tablet Take 650 mg by mouth every 8 (eight) hours as needed for pain.    Marland Kitchen atorvastatin (LIPITOR) 80 MG tablet Take 80 mg by mouth every evening.     . clopidogrel (PLAVIX) 75 MG tablet Take 75 mg by mouth every morning.     . Cranberry-Vitamin C-Probiotic (AZO CRANBERRY PO) Take 1 tablet by mouth 2 (two) times daily.    . metoprolol succinate (TOPROL-XL) 25 MG 24 hr tablet Take 25 mg by mouth every morning.     . nitroGLYCERIN (NITROSTAT) 0.4 MG SL tablet Place 0.4 mg under the tongue every 5 (five) minutes as needed for chest pain.    . NUCYNTA 50 MG TABS tablet Take 1 tablet by mouth every 8 (eight) hours.  0  . pantoprazole (PROTONIX) 40 MG tablet Take 40 mg by mouth daily.    . rivaroxaban (XARELTO) 20 MG TABS tablet Take 1 tablet (20 mg total) by mouth daily with supper. 30 tablet    No current facility-administered medications for this visit.    History   Social History  . Marital Status: Married    Spouse Name: N/A    Number of Children: N/A  . Years of Education: N/A   Occupational History  . Not on file.   Social History Main Topics  . Smoking status: Former Smoker -- 0.50 packs/day for 35 years    Types: Cigarettes    Quit date: 02/12/2013  . Smokeless tobacco: Never Used  . Alcohol Use: Yes     Comment: rare   . Drug Use: No  . Sexual Activity: Yes    Birth Control/  Protection: None   Other Topics Concern  . Not on file   Social History Narrative   Family history is notable for heart disease in mother as well as diabetes in both mother and father.   ROS General: Negative; No fevers, chills, or night sweats HEENT: Negative; No changes in vision or hearing, sinus congestion, difficulty swallowing Pulmonary: Negative; No cough, wheezing, shortness of breath, hemoptysis Cardiovascular: No recurrent chest pain following his MI and intervention GI: Negative; No nausea, vomiting, diarrhea, or abdominal pain GU:  Negative; No dysuria, hematuria, or difficulty voiding Musculoskeletal: Negative; no myalgias, joint pain, or weakness Hematologic: Negative; no easy bruising, bleeding Endocrine: Negative; no heat/cold intolerance Neuro: Negative; no changes in balance, headaches Skin: Negative; No rashes or skin lesions Psychiatric: Negative; No behavioral problems, depression Sleep: See history of present illness:  no bruxism, restless legs, hypnogognic hallucinations, no cataplexy   Physical Exam BP 144/91 mmHg  Pulse 65  Ht 5\' 7"  (1.702 m)  Wt 219 lb (99.338 kg)  BMI 34.29 kg/m2  General: Alert, oriented, no distress.  Skin: normal turgor, no rashes HEENT: Normocephalic, atraumatic. Pupils round and reactive; sclera anicteric; extraocular muscles intact; Fundi mild arteriolar narrowing Nose without nasal septal hypertrophy Mouth/Parynx benign; Mallinpatti scale 3/4 Neck: No JVD, no carotid briuts Lungs: clear to ausculatation and percussion; no wheezing or rales  Chest wall: No tenderness to palpation Heart: RRR, s1 s2 normal faint 1/6 systolic murmur.  No rubs thrills or heaves Abdomen: soft, nontender; no hepatosplenomehaly, BS+; abdominal aorta nontender and not dilated by palpation. Back: No CVA tenderness Pulses 2+ Extremities: no clubbinbg cyanosis or edema, Homan's sign negative  Neurologic: grossly nonfocal; cranial nerves  intact. Psychological: Normal affect and mood.    LABS:  BMET    Component Value Date/Time   NA 140 09/06/2013 0034   K 4.0 09/06/2013 0034   CL 107 09/06/2013 0034   CO2 22 09/06/2013 0034   GLUCOSE 92 09/06/2013 0034   BUN 12 09/06/2013 0034   CREATININE 0.85 09/06/2013 0034   CREATININE 0.83 02/08/2013 0847   CALCIUM 8.4 09/06/2013 0034   GFRNONAA >90 09/06/2013 0034   GFRAA >90 09/06/2013 0034     Hepatic Function Panel     Component Value Date/Time   PROT 5.8* 09/06/2013 0034   ALBUMIN 3.1* 09/06/2013 0034   AST 19 09/06/2013 0034   ALT 24 09/06/2013 0034   ALKPHOS 59 09/06/2013 0034   BILITOT 1.0 09/06/2013 0034     CBC    Component Value Date/Time   WBC 11.1* 09/06/2013 0034   WBC 10.1 11/23/2012 0909   RBC 4.21* 09/06/2013 0034   RBC 5.21 11/23/2012 0909   HGB 13.1 09/06/2013 0034   HGB 16.6 11/23/2012 0909   HCT 39.2 09/06/2013 0034   HCT 52.8 11/23/2012 0909   PLT 156 09/06/2013 0034   MCV 93.1 09/06/2013 0034   MCV 101.3* 11/23/2012 0909   MCH 31.1 09/06/2013 0034   MCH 31.9* 11/23/2012 0909   MCHC 33.4 09/06/2013 0034   MCHC 31.4* 11/23/2012 0909   RDW 13.3 09/06/2013 0034     BNP No results found for: PROBNP  Lipid Panel     Component Value Date/Time   CHOL 203* 11/23/2012 0949   TRIG 206* 01/28/2013 0939   TRIG 229* 11/23/2012 0949   HDL 37* 11/23/2012 0949   CHOLHDL 5.5 11/23/2012 0949   VLDL 46* 11/23/2012 0949   LDLCALC 111* 01/28/2013 0939   LDLCALC 120* 11/23/2012 0949     RADIOLOGY: No results found.    ASSESSMENT AND PLAN: Mr. Jankowski has significant cardiovascular comorbidities including CAD, status post non-ST segment elevation myocardial infarction, paroxysmal atrial fibrillation, and obesity.  He has severe obstructive sleep apnea with an AHI of 60 per hour, which increased to 78/h during REM sleep and had a significant positional component with supine sleep with an AHI of 116 per hour.  On his diagnostic  polysomnogram oxygen saturation to 80% and had frequent periodic limb movements.  His periodic limb movements  dramatically improved with CPAP titration.  I discussed with him at length normal  sleep architecture and the effect of OSA. .  Presently, he is meeting Medicare compliance standards with reference to CPAP use.  However, he is not using CPAP for long enough duration or sleeping for long enough duration.  We discussed the importance of sleeping an average of 8 hours per night.  Due to his back discomfort and his need to sleep on his back to prevent his back pain  his current set  pressure at 8 cm water is not adequate to meet his increased requirement with supine sleep.  For this reason, I am changing his CPAP setting to an auto mode with a minimum pressure of 8, which can self titrate up to a maximum of 20 cm water pressure if needed.  His Epworth Sleepiness Scale score today endorsed at 14 which is still suggestive of excessive daytime sleepiness.  This may be due to his residual moderate sleep apnea verified on his current download as well as his inadequate sleep duration.  A repeat download will be obtained in approximately 30 days on his auto mode and further recommendations will be made at that time if necessary.  We also discussed importance of good sleep hygiene as well as weight loss.  I will see him back on an as-needed basis from a sleep perspective, if problems arise.   Time spent: 25 minutes  Troy Sine, MD, [facility]  11/24/2013 8:30 AM

## 2013-11-28 ENCOUNTER — Encounter: Payer: Self-pay | Admitting: Cardiovascular Disease

## 2013-12-04 ENCOUNTER — Other Ambulatory Visit: Payer: Self-pay | Admitting: Physician Assistant

## 2013-12-05 NOTE — Telephone Encounter (Signed)
Rx was sent to pharmacy electronically. 

## 2013-12-15 ENCOUNTER — Encounter (HOSPITAL_COMMUNITY): Payer: Self-pay | Admitting: Cardiovascular Disease

## 2013-12-22 ENCOUNTER — Encounter: Payer: Self-pay | Admitting: Cardiovascular Disease

## 2013-12-26 ENCOUNTER — Emergency Department (HOSPITAL_COMMUNITY): Payer: Worker's Compensation

## 2013-12-26 ENCOUNTER — Encounter (HOSPITAL_COMMUNITY): Payer: Self-pay | Admitting: Family Medicine

## 2013-12-26 ENCOUNTER — Telehealth: Payer: Self-pay | Admitting: Internal Medicine

## 2013-12-26 ENCOUNTER — Emergency Department (HOSPITAL_COMMUNITY)
Admission: EM | Admit: 2013-12-26 | Discharge: 2013-12-26 | Disposition: A | Discharge status: Home / Self Care | Payer: Worker's Compensation | Attending: Emergency Medicine | Admitting: Emergency Medicine

## 2013-12-26 DIAGNOSIS — Z79899 Other long term (current) drug therapy: Secondary | ICD-10-CM | POA: Diagnosis not present

## 2013-12-26 DIAGNOSIS — Y990 Civilian activity done for income or pay: Secondary | ICD-10-CM | POA: Insufficient documentation

## 2013-12-26 DIAGNOSIS — W208XXA Other cause of strike by thrown, projected or falling object, initial encounter: Secondary | ICD-10-CM | POA: Diagnosis not present

## 2013-12-26 DIAGNOSIS — Z87891 Personal history of nicotine dependence: Secondary | ICD-10-CM | POA: Diagnosis not present

## 2013-12-26 DIAGNOSIS — S0990XA Unspecified injury of head, initial encounter: Secondary | ICD-10-CM

## 2013-12-26 DIAGNOSIS — S0083XA Contusion of other part of head, initial encounter: Secondary | ICD-10-CM | POA: Diagnosis not present

## 2013-12-26 DIAGNOSIS — I252 Old myocardial infarction: Secondary | ICD-10-CM | POA: Diagnosis not present

## 2013-12-26 DIAGNOSIS — Y9389 Activity, other specified: Secondary | ICD-10-CM | POA: Diagnosis not present

## 2013-12-26 DIAGNOSIS — Y9289 Other specified places as the place of occurrence of the external cause: Secondary | ICD-10-CM | POA: Insufficient documentation

## 2013-12-26 DIAGNOSIS — M1990 Unspecified osteoarthritis, unspecified site: Secondary | ICD-10-CM | POA: Diagnosis not present

## 2013-12-26 DIAGNOSIS — Z9981 Dependence on supplemental oxygen: Secondary | ICD-10-CM | POA: Diagnosis not present

## 2013-12-26 DIAGNOSIS — Z87442 Personal history of urinary calculi: Secondary | ICD-10-CM | POA: Diagnosis not present

## 2013-12-26 DIAGNOSIS — Z955 Presence of coronary angioplasty implant and graft: Secondary | ICD-10-CM | POA: Insufficient documentation

## 2013-12-26 DIAGNOSIS — E785 Hyperlipidemia, unspecified: Secondary | ICD-10-CM | POA: Insufficient documentation

## 2013-12-26 DIAGNOSIS — I480 Paroxysmal atrial fibrillation: Secondary | ICD-10-CM | POA: Diagnosis not present

## 2013-12-26 DIAGNOSIS — Z7901 Long term (current) use of anticoagulants: Secondary | ICD-10-CM | POA: Diagnosis not present

## 2013-12-26 DIAGNOSIS — G4730 Sleep apnea, unspecified: Secondary | ICD-10-CM | POA: Insufficient documentation

## 2013-12-26 DIAGNOSIS — I2510 Atherosclerotic heart disease of native coronary artery without angina pectoris: Secondary | ICD-10-CM | POA: Insufficient documentation

## 2013-12-26 DIAGNOSIS — I10 Essential (primary) hypertension: Secondary | ICD-10-CM | POA: Insufficient documentation

## 2013-12-26 NOTE — ED Notes (Signed)
NAD at this time. Pt is stable and going home.  
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
## 2013-12-26 NOTE — ED Notes (Signed)
Pt currently in ct
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
## 2013-12-27 ENCOUNTER — Encounter: Payer: Self-pay | Admitting: *Deleted

## 2014-01-29 ENCOUNTER — Other Ambulatory Visit: Payer: Self-pay | Admitting: Internal Medicine

## 2014-02-05 ENCOUNTER — Other Ambulatory Visit: Payer: Self-pay | Admitting: Internal Medicine

## 2014-02-05 ENCOUNTER — Other Ambulatory Visit: Payer: Self-pay | Admitting: Physician Assistant

## 2014-02-06 DIAGNOSIS — I2699 Other pulmonary embolism without acute cor pulmonale: Secondary | ICD-10-CM

## 2014-02-06 HISTORY — DX: Other pulmonary embolism without acute cor pulmonale: I26.99

## 2014-02-06 NOTE — Telephone Encounter (Signed)
Rx refill sent to patient pharmacy   

## 2014-02-21 ENCOUNTER — Ambulatory Visit (INDEPENDENT_AMBULATORY_CARE_PROVIDER_SITE_OTHER): Payer: Commercial Managed Care - PPO | Admitting: Internal Medicine

## 2014-02-21 ENCOUNTER — Encounter: Payer: Self-pay | Admitting: Internal Medicine

## 2014-02-21 VITALS — BP 110/70 | HR 55 | Ht 67.0 in | Wt 224.9 lb

## 2014-02-21 DIAGNOSIS — I480 Paroxysmal atrial fibrillation: Secondary | ICD-10-CM | POA: Diagnosis not present

## 2014-02-21 DIAGNOSIS — M549 Dorsalgia, unspecified: Secondary | ICD-10-CM | POA: Insufficient documentation

## 2014-02-21 DIAGNOSIS — I214 Non-ST elevation (NSTEMI) myocardial infarction: Secondary | ICD-10-CM | POA: Diagnosis not present

## 2014-02-21 DIAGNOSIS — I2510 Atherosclerotic heart disease of native coronary artery without angina pectoris: Secondary | ICD-10-CM

## 2014-02-21 DIAGNOSIS — M5489 Other dorsalgia: Secondary | ICD-10-CM

## 2014-02-21 DIAGNOSIS — E669 Obesity, unspecified: Secondary | ICD-10-CM

## 2014-02-21 DIAGNOSIS — Z9861 Coronary angioplasty status: Secondary | ICD-10-CM

## 2014-02-21 DIAGNOSIS — G4733 Obstructive sleep apnea (adult) (pediatric): Secondary | ICD-10-CM

## 2014-02-21 NOTE — Progress Notes (Signed)
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 He apparently travels significantly and has a pretty poor diet. He reports drinking about 6 alcoholic beverages per week and smokes about one half pack per day for the past 30 years. Recently, while up in [place], he was suffering from a kidney stone and was noted to have atrial fibrillation. He was unaware of this, and was given medication to slow his heart rate. Apparently he converted back into sinus and was instructed to be on aspirin.  He has recently been describing some chest pain which is substernal and radiates to the jaw and down into his right arm. The symptoms last for about 5-10 minutes and go away at rest, if he has been exerting himself. He also has a history of heart disease both in his mother and a father who had high blood pressure.  In addition he filled out an S4 sleepiness scale today which was scored as 12 suggesting a higher than likely possibility of sleep apnea.  He does report difficulty staying asleep, and feeling drowsy throughout the day with the need to take frequent naps.   [name] returns today for followup of his nuclear stress test which was performed on 02/02/2013. This showed essentially no reversible ischemia, however the EF was reduced at 48%. It is unclear to me whether this is a gating abnormality or perhaps due to long-standing hypertension, undiagnosed sleep apnea, or multivessel coronary disease. He did have the episode of paroxysmal atrial fibrillation and continues to have chest pain which is very concerning for angina. He also had a recent lipid profile demonstrating an LDL of 111 and LDL particle number of 1774.  Triglycerides were elevated at 206.  I recommended starting Lipitor 40 mg daily.  Unfortunately, while undergoing work-up, he presented on  02/12/13 with a NSTEMI. He is followed by Dr Debara Pickett and had recently been seen in the office for chest pain. He underwent a Myoview on 02/02/13 which returned abnormal with an EF of 48%, but there was no ischemia. He was then scheduled for an elective cardiac cath for the next week. However, on 02/12/13 he woke up with SSCP "tightness" associated with diaphoresis, SOB, and radiation to his jaw and right arm and proceeded to the ED. While in the ED the patient developed nausea and was noted to be bradycardic with HR in the 30s and SBP dropped to the 50s. ECG showed complete heart block but with a narrow escape. He was given atropine 0.5 mg with rise in HR to the 70s, NSR. Patient's second troponin returned abnormal at 0.17 and continued to climb >20. His episode of complete heart block was suspected to be vagally mediated from nausea; however, it was thought that RCA ischemia needed to be ruled out completely. It was decided that he proceed with a cardiac cath.   Cardiac cath on 02/12/13 revealed an acutely occluded RCA which was successfully treated with PTCA and diffuse stenting due to RCA stenoses beyond the point of total occlusion. The patient also had high-grade concomitant proximal LAD disease which required staged PCI. Left ventriculography revealed an ejection fraction of ~50%. There was evidence for mild mid and distal inferior hypocontractility as well. The patient developed atrial fibrillation during the procedure which was rate controlled with an IV beta blocker and then a low dose oral agent. He  underwent staged PCI to LAD on 02/14/13 with successful PTCA/DES x 1 to proximal LAD and he was placed on ASA/Brilinta.  It was felt that the patient would not tolerate atrial fibrillation well and underwent successful TEE/DCCV on 02/15/13. He is on triple therapy with coumadin. The patient had some issues with hematuria (likely from foley cath related injury) prompting more serious discission about his anticoagulation  status. Dr. Burt Knack reviewed his options considering his recent MI and multivessel stenting as well as indication for anticoagulation with atrial fib and felt it best that he continue ASA 81 mg, switch brilinta to plavix and continue heparin/warfarin overlap, which would be limited to 3-6 months depending on his clinical course. Of note patient also had some shortness of breath on Brilinta, which was resolved when he was switched to Plavix.  Today he reports doing fairly well other than low back pain. He did have a positive sleep study and still reports difficulty sleeping at night. Although he has not been set with a CPAP machine. We need to try to arrange that. The difficulty may have been affected he lost his insurance. He reports to me that he did however secure a job and will be starting in the next few weeks. He's actually did be working 10 hour days and is concerned about his ability to stay on a seat for that long. He's having problems with his back as mentioned and has seen Dr. Maryjean Ka who is contemplating injections. I had initially cleared him for this however realize that he just had recent stenting and that we would be unable to stop his antiplatelet therapy for at least one year, which is up in February 2016.  I saw [name] back today. He is doing much better. He is now using CPAP and reports some improvement in his sleep. He continues to have problems with back pain which is his main concern. Is now been over a year since he underwent drug-eluting stent placement and I feel comfortable adjusting his antiplatelet medicines. We had previously kept him on Plavix and Zarrella toe due to the high bleeding risk of being on triple therapy. He had a small amount of PAF which was post procedure and has a low CHADSVASC score of 0-1 (possible hypertension). Given his young age and the fact that his atrial fibrillation was mostly procedurally related and related to a kidney stone attack, and the fact  that he is now on CPAP which likely increased his risk of atrial fibrillation, I suspect his burden of age of fibrillation is very low. He is in sinus rhythm today. I would like to discontinue Zarrella toe and switch him over to aspirin and Plavix. He could of course stop this as needed for back injections which he will would likely pursue within the next couple of weeks.  PMHx:  Past Medical History  Diagnosis Date  . PAF (paroxysmal atrial fibrillation)     during a kidney stone attack  . Dyslipidemia   . CAD (coronary artery disease)     multivessel per cath 02/12/13, s/p stenting to RCA  . Hypertension   . Nausea & vomiting   . PONV (postoperative nausea and vomiting)   . Myocardial infarction     02/12/2013   . Old myocardial infarction   . Dysrhythmia     hx of atrial fib   . Sleep apnea     cpap recently   . Nephrolithiasis   . Arthritis     all over  Past Surgical History  Procedure Laterality Date  . Tee without cardioversion N/A 02/15/2013    Procedure: TRANSESOPHAGEAL ECHOCARDIOGRAM (TEE);  Surgeon: Thayer Headings, MD;  Location: [facility];  Service: Cardiovascular;  Laterality: N/A;  . Cardioversion N/A 02/15/2013    Procedure: CARDIOVERSION;  Surgeon: Thayer Headings, MD;  Location: [facility] ENDOSCOPY;  Service: Cardiovascular;  Laterality: N/A;  . Coronary angioplasty with stent placement  02/12/2013  . Cystoscopy with retrograde pyelogram, ureteroscopy and stent placement Left 09/02/2013    Procedure: CYSTOSCOPY WITH RETROGRADE PYELOGRAM/LEFT URETEROSCOPY/URETERAL STENT, with fulguration;  Surgeon: Festus Aloe, MD;  Location: [facility];  Service: Urology;  Laterality: Left;  . Cystoscopy with ureteroscopy and stent placement Left 09/16/2013    Procedure: CYSTOSCOPY WITH URETEROSCOPY AND STENT PLACEMENT;  Surgeon: Festus Aloe, MD;  Location: [facility];  Service: Urology;  Laterality: Left;  . Holmium laser application Left 2/40/9735    Procedure: HOLMIUM LASER  APPLICATION;  Surgeon: Festus Aloe, MD;  Location: [facility];  Service: Urology;  Laterality: Left;  . Left heart catheterization with coronary angiogram N/A 02/12/2013    Procedure: LEFT HEART CATHETERIZATION WITH CORONARY ANGIOGRAM;  Surgeon: Troy Sine, MD;  Location: [facility] CATH LAB;  Service: Cardiovascular;  Laterality: N/A;  . Percutaneous coronary stent intervention (pci-s)  02/12/2013    Procedure: PERCUTANEOUS CORONARY STENT INTERVENTION (PCI-S);  Surgeon: Troy Sine, MD;  Location: [facility] CATH LAB;  Service: Cardiovascular;;  STEMI  . Percutaneous coronary stent intervention (pci-s) N/A 02/14/2013    Procedure: PERCUTANEOUS CORONARY STENT INTERVENTION (PCI-S);  Surgeon: Burnell Blanks, MD;  Location: [facility] CATH LAB;  Service: Cardiovascular;  Laterality: N/A;  Proximal LAD    FAMHx:  Family History  Problem Relation Age of Onset  . Heart disease Mother   . Diabetes Mother   . Diabetes Father     SOCHx:   reports that he quit smoking about a year ago. His smoking use included Cigarettes. He has a 17.5 pack-year smoking history. He has never used smokeless tobacco. He reports that he drinks alcohol. He reports that he does not use illicit drugs.  ALLERGIES:  No Known Allergies  ROS: A comprehensive review of systems was negative except for: Constitutional: positive for fatigue and difficulty sleeping, snoring Musculoskeletal: positive for back pain  HOME MEDS: Current Outpatient Prescriptions  Medication Sig Dispense Refill  . acetaminophen (TYLENOL) 650 MG CR tablet Take 650 mg by mouth every 8 (eight) hours as needed for pain.    Marland Kitchen aspirin EC 81 MG tablet Take 81 mg by mouth daily.    Marland Kitchen atorvastatin (LIPITOR) 80 MG tablet Take 80 mg by mouth every evening.     . clopidogrel (PLAVIX) 75 MG tablet TAKE 1 TABLET (75 MG TOTAL) BY MOUTH DAILY WITH BREAKFAST. 30 tablet 5  . DULoxetine (CYMBALTA) 30 MG capsule Take 30 mg by mouth daily.     Marland Kitchen HYDROcodone-acetaminophen (NORCO)  10-325 MG per tablet Take 1 tablet by mouth every 6 (six) hours as needed.    . metoprolol succinate (TOPROL-XL) 25 MG 24 hr tablet Take 25 mg by mouth every morning.     . nitroGLYCERIN (NITROSTAT) 0.4 MG SL tablet Place 0.4 mg under the tongue every 5 (five) minutes as needed for chest pain.    . NON FORMULARY at bedtime. CPAP    . pantoprazole (PROTONIX) 40 MG tablet TAKE 1 TABLET (40 MG TOTAL) BY MOUTH DAILY. 30 tablet 7   No current facility-administered medications for this visit.  LABS/IMAGING: No results found for this or any previous visit (from the past 48 hour(s)). No results found.  VITALS: BP 110/70 mmHg  Pulse 55  Ht 5\' 7"  (1.702 m)  Wt 224 lb 14.4 oz (102.014 kg)  BMI 35.22 kg/m2  EXAM: General appearance: alert and no distress Neck: no carotid bruit, no JVD and thyroid not enlarged, symmetric, no tenderness/mass/nodules Lungs: clear to auscultation bilaterally Heart: regular rate and rhythm, S1, S2 normal, no murmur, click, rub or gallop Abdomen: soft, non-tender; bowel sounds normal; no masses,  no organomegaly Extremities: extremities normal, atraumatic, no cyanosis or edema Pulses: 2+ and symmetric Skin: Skin color, texture, turgor normal. No rashes or lesions Neurologic: Grossly normal Psych: Normal  EKG: Sinus bradycardia 55  ASSESSMENT:  1. Tobacco abuse 2. CAD s/p PCi to the entire RCA (50+ mm) with DES in 02/2013 and mid-LAD 02/2013 3. Paroxysmal atrial fibrillation - related to the procedure 4. OSA on CPAP 5. Low back pain, difficulty walking  PLAN: 1.   Mr. Shipes is at a point where we could consider stopping dual antiplatelet therapy for short periods of time to allow back injections and/or back surgery if necessary. He should continue to use CPAP which will be helpful reducing his risk of A. fib. Most of his A. fib seems to be related to procedures and one episode associated with kidney stones. He has not had recurrence. I think at this  point based on the fact that he has a very low CHADSVASC score of less than 1, then he can come off of his overall toe, but should however stay on aspirin and Plavix. This could of course be held for 7-10 and 5 days prior to procedures, respectively.  Plan to see him back annually or sooner as necessary. We will go ahead and check a lipid profile prior to his next visit.  Pixie Casino, MD, [facility] Attending Cardiologist [facility]  Kariyah Baugh C 02/21/2014, 5:13 PM

## 2014-02-21 NOTE — Patient Instructions (Addendum)
STOP xarelto  START aspirin 81mg  daily   Your physician wants you to follow-up in: 1 year with [name]. You will receive a reminder letter in the mail two months in advance. If you don't receive a letter, please call our office to schedule the follow-up appointment. Please have fasting lab work in 1 year.

## 2014-03-05 ENCOUNTER — Other Ambulatory Visit: Payer: Self-pay | Admitting: Physician Assistant

## 2014-03-06 NOTE — Telephone Encounter (Signed)
Rx(s) sent to pharmacy electronically.  

## 2014-03-16 ENCOUNTER — Telehealth: Payer: Self-pay | Admitting: *Deleted

## 2014-03-16 NOTE — Telephone Encounter (Signed)
Faxed clearance for epidural steroid injection - can HOLD plavix for 7 days prior to procedure.

## 2014-03-22 ENCOUNTER — Telehealth: Payer: Self-pay | Admitting: Internal Medicine

## 2014-03-22 ENCOUNTER — Other Ambulatory Visit: Payer: Self-pay | Admitting: *Deleted

## 2014-03-22 NOTE — Telephone Encounter (Signed)
Pt is taking Toprol ,wants to know if there is a generic or something else he can take?

## 2014-03-22 NOTE — Telephone Encounter (Signed)
Pt.switched to generic toprol

## 2014-05-11 ENCOUNTER — Telehealth: Payer: Self-pay | Admitting: *Deleted

## 2014-05-11 ENCOUNTER — Encounter: Payer: Self-pay | Admitting: Cardiovascular Disease

## 2014-05-11 NOTE — Telephone Encounter (Signed)
Faxed order confirmation to [facility] for CPAP.

## 2014-06-14 ENCOUNTER — Telehealth: Payer: Self-pay | Admitting: Internal Medicine

## 2014-06-14 NOTE — Telephone Encounter (Signed)
06/14/2014 Received referral packet faxed on patient from Dr. Shanon Rosser office for upcoming appointment with [name] @ 1:45 pm on 06/20/2014. Records given to [facility].  cbr

## 2014-06-20 ENCOUNTER — Ambulatory Visit (INDEPENDENT_AMBULATORY_CARE_PROVIDER_SITE_OTHER): Payer: Commercial Managed Care - PPO | Admitting: Internal Medicine

## 2014-06-20 ENCOUNTER — Encounter: Payer: Self-pay | Admitting: Internal Medicine

## 2014-06-20 VITALS — BP 110/86 | HR 86 | Ht 67.0 in | Wt 224.0 lb

## 2014-06-20 DIAGNOSIS — I2699 Other pulmonary embolism without acute cor pulmonale: Secondary | ICD-10-CM | POA: Insufficient documentation

## 2014-06-20 DIAGNOSIS — I200 Unstable angina: Secondary | ICD-10-CM

## 2014-06-20 DIAGNOSIS — I2510 Atherosclerotic heart disease of native coronary artery without angina pectoris: Secondary | ICD-10-CM | POA: Diagnosis not present

## 2014-06-20 DIAGNOSIS — G4733 Obstructive sleep apnea (adult) (pediatric): Secondary | ICD-10-CM | POA: Diagnosis not present

## 2014-06-20 DIAGNOSIS — I480 Paroxysmal atrial fibrillation: Secondary | ICD-10-CM | POA: Diagnosis not present

## 2014-06-20 DIAGNOSIS — Z9861 Coronary angioplasty status: Secondary | ICD-10-CM

## 2014-06-20 NOTE — Patient Instructions (Addendum)
Your physician recommends that you schedule a follow-up appointment in: 1 Week for EKG  Dr Debara Pickett need to see EKG and then will decide if Cardioversion is needed

## 2014-06-20 NOTE — Progress Notes (Signed)
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 He apparently travels significantly and has a pretty poor diet. He reports drinking about 6 alcoholic beverages per week and smokes about one half pack per day for the past 30 years. Recently, while up in [place], he was suffering from a kidney stone and was noted to have atrial fibrillation. He was unaware of this, and was given medication to slow his heart rate. Apparently he converted back into sinus and was instructed to be on aspirin.  He has recently been describing some chest pain which is substernal and radiates to the jaw and down into his right arm. The symptoms last for about 5-10 minutes and go away at rest, if he has been exerting himself. He also has a history of heart disease both in his mother and a father who had high blood pressure.  In addition he filled out an S4 sleepiness scale today which was scored as 12 suggesting a higher than likely possibility of sleep apnea.  He does report difficulty staying asleep, and feeling drowsy throughout the day with the need to take frequent naps.   Paul Todd returns today for followup of his nuclear stress test which was performed on 02/02/2013. This showed essentially no reversible ischemia, however the EF was reduced at 48%. It is unclear to me whether this is a gating abnormality or perhaps due to long-standing hypertension, undiagnosed sleep apnea, or multivessel coronary disease. He did have the episode of paroxysmal atrial fibrillation and continues to have chest pain which is very concerning for angina. He also had a recent lipid profile demonstrating an LDL of 111 and LDL particle number of 1774.  Triglycerides were elevated at 206.  I recommended starting Lipitor 40 mg daily.  Unfortunately, while  undergoing work-up, he presented on 02/12/13 with a NSTEMI. He is followed by Dr Debara Pickett and had recently been seen in the office for chest pain. He underwent a Myoview on 02/02/13 which returned abnormal with an EF of 48%, but there was no ischemia. He was then scheduled for an elective cardiac cath for the next week. However, on 02/12/13 he woke up with SSCP "tightness" associated with diaphoresis, SOB, and radiation to his jaw and right arm and proceeded to the ED. While in the ED the patient developed nausea and was noted to be bradycardic with HR in the 30s and SBP dropped to the 50s. ECG showed complete heart block but with a narrow escape. He was given atropine 0.5 mg with rise in HR to the 70s, NSR. Patient's second troponin returned abnormal at 0.17 and continued to climb >20. His episode of complete heart block was suspected to be vagally mediated from nausea; however, it was thought that RCA ischemia needed to be ruled out completely. It was decided that he proceed with a cardiac cath.   Cardiac cath on 02/12/13 revealed an acutely occluded RCA which was successfully treated with PTCA and diffuse stenting due to RCA stenoses beyond the point of total occlusion. The patient also had high-grade concomitant proximal LAD disease which required staged PCI. Left ventriculography revealed an ejection fraction of ~50%. There was evidence for mild mid and distal inferior hypocontractility as well. The patient developed atrial fibrillation during the procedure which was rate controlled with an IV beta blocker and then a low  dose oral agent. He underwent staged PCI to LAD on 02/14/13 with successful PTCA/DES x 1 to proximal LAD and he was placed on ASA/Brilinta.  It was felt that the patient would not tolerate atrial fibrillation well and underwent successful TEE/DCCV on 02/15/13. He is on triple therapy with coumadin. The patient had some issues with hematuria (likely from foley cath related injury) prompting more serious  discission about his anticoagulation status. Dr. Burt Knack reviewed his options considering his recent MI and multivessel stenting as well as indication for anticoagulation with atrial fib and felt it best that he continue ASA 81 mg, switch brilinta to plavix and continue heparin/warfarin overlap, which would be limited to 3-6 months depending on his clinical course. Of note patient also had some shortness of breath on Brilinta, which was resolved when he was switched to Plavix.  Today he reports doing fairly well other than low back pain. He did have a positive sleep study and still reports difficulty sleeping at night. Although he has not been set with a CPAP machine. We need to try to arrange that. The difficulty may have been affected he lost his insurance. He reports to me that he did however secure a job and will be starting in the next few weeks. He's actually did be working 10 hour days and is concerned about his ability to stay on a seat for that long. He's having problems with his back as mentioned and has seen Dr. Maryjean Ka who is contemplating injections. I had initially cleared him for this however realize that he just had recent stenting and that we would be unable to stop his antiplatelet therapy for at least one year, which is up in February 2016.  I saw Paul Todd back today. He is doing much better. He is now using CPAP and reports some improvement in his sleep. He continues to have problems with back pain which is his main concern. Is now been over a year since he underwent drug-eluting stent placement and I feel comfortable adjusting his antiplatelet medicines. We had previously kept him on Plavix and Zarrella toe due to the high bleeding risk of being on triple therapy. He had a small amount of PAF which was post procedure and has a low CHADSVASC score of 0-1 (possible hypertension). Given his young age and the fact that his atrial fibrillation was mostly procedurally related and related to a  kidney stone attack, and the fact that he is now on CPAP which likely increased his risk of atrial fibrillation, I suspect his burden of age of fibrillation is very low. He is in sinus rhythm today. I would like to discontinue Xarelto and switch him over to aspirin and Plavix. He could of course stop this as needed for back injections which he will would likely pursue within the next couple of weeks.  Paul Todd returns today. Unfortunately, he only been off of Xarelto for a few weeks to allow back injections, but then developed acute chest pain and was found to have acute pulmonary embolus at [facility] by VQ scan. Placed back on Xarelto 15 mg twice a day and is about to complete the 21 day course at that dose at which time he'll switch to the 20 mg daily dose. He reports she's been fatigue but that his chest pains gotten better. His EKG does show that he is back in atrial fibrillation today. At his last office visit he was in sinus rhythm. It may be that is in A. fib  and that's why he feels bad.  PMHx:  Past Medical History  Diagnosis Date  . PAF (paroxysmal atrial fibrillation)     during a kidney stone attack  . Dyslipidemia   . CAD (coronary artery disease)     multivessel per cath 02/12/13, s/p stenting to RCA  . Hypertension   . Nausea & vomiting   . PONV (postoperative nausea and vomiting)   . Myocardial infarction     02/12/2013   . Old myocardial infarction   . Dysrhythmia     hx of atrial fib   . Sleep apnea     cpap recently   . Nephrolithiasis   . Arthritis     all over    Past Surgical History  Procedure Laterality Date  . Tee without cardioversion N/A 02/15/2013    Procedure: TRANSESOPHAGEAL ECHOCARDIOGRAM (TEE);  Surgeon: Thayer Headings, MD;  Location: [place] [facility];  Service: Cardiovascular;  Laterality: N/A;  . Cardioversion N/A 02/15/2013    Procedure: CARDIOVERSION;  Surgeon: Thayer Headings, MD;  Location: [facility] ENDOSCOPY;  Service: Cardiovascular;  Laterality:  N/A;  . Coronary angioplasty with stent placement  02/12/2013  . Cystoscopy with retrograde pyelogram, ureteroscopy and stent placement Left 09/02/2013    Procedure: CYSTOSCOPY WITH RETROGRADE PYELOGRAM/LEFT URETEROSCOPY/URETERAL STENT, with fulguration;  Surgeon: Festus Aloe, MD;  Location: [facility] ORS;  Service: Urology;  Laterality: Left;  . Cystoscopy with ureteroscopy and stent placement Left 09/16/2013    Procedure: CYSTOSCOPY WITH URETEROSCOPY AND STENT PLACEMENT;  Surgeon: Festus Aloe, MD;  Location: [facility] ORS;  Service: Urology;  Laterality: Left;  . Holmium laser application Left 08/15/1749    Procedure: HOLMIUM LASER APPLICATION;  Surgeon: Festus Aloe, MD;  Location: [facility] OR[facility];  Service: Urology;  Laterality: Left;  . Left heart catheterization with coronary angiogram N/A 02/12/2013    Procedure: LEFT HEART CATHETERIZATION WITH CORONARY ANGIOGRAM;  Surgeon: Troy Sine, MD;  Location: [facility] CATH LAB;  Service: Cardiovascular;  Laterality: N/A;  . Percutaneous coronary stent intervention (pci-s)  02/12/2013    Procedure: PERCUTANEOUS CORONARY STENT INTERVENTION (PCI-S);  Surgeon: Troy Sine, MD;  Location: [facility] CATH LAB;  Service: Cardiovascular;;  STEMI  . Percutaneous coronary stent intervention (pci-s) N/A 02/14/2013    Procedure: PERCUTANEOUS CORONARY STENT INTERVENTION (PCI-S);  Surgeon: Burnell Blanks, MD;  Location: [facility] CATH LAB;  Service: Cardiovascular;  Laterality: N/A;  Proximal LAD    FAMHx:  Family History  Problem Relation Age of Onset  . Heart disease Mother   . Diabetes Mother   . Diabetes Father     SOCHx:   reports that he quit smoking about 16 months ago. His smoking use included Cigarettes. He has a 17.5 pack-year smoking history. He has never used smokeless tobacco. He reports that he drinks alcohol. He reports that he does not use illicit drugs.  ALLERGIES:  No Known Allergies  ROS: A comprehensive review of systems was negative except for:  Constitutional: positive for fatigue and difficulty sleeping, snoring Musculoskeletal: positive for back pain  HOME MEDS: Current Outpatient Prescriptions  Medication Sig Dispense Refill  . acetaminophen (TYLENOL) 650 MG CR tablet Take 650 mg by mouth every 8 (eight) hours as needed for pain.    Marland Kitchen aspirin EC 81 MG tablet Take 81 mg by mouth daily.    Marland Kitchen atorvastatin (LIPITOR) 80 MG tablet Take 1 tablet (80 mg total) by mouth daily. 90 tablet 3  . DULoxetine (CYMBALTA) 30 MG capsule Take 30 mg by mouth daily.     Marland Kitchen  HYDROcodone-acetaminophen (NORCO) 10-325 MG per tablet Take 1 tablet by mouth every 6 (six) hours as needed.    . metoprolol succinate (TOPROL-XL) 50 MG 24 hr tablet Take 50 mg by mouth daily. Take with or immediately following a meal.    . nitroGLYCERIN (NITROSTAT) 0.4 MG SL tablet Place 0.4 mg under the tongue every 5 (five) minutes as needed for chest pain.    . NON FORMULARY at bedtime. CPAP    . pantoprazole (PROTONIX) 40 MG tablet TAKE 1 TABLET (40 MG TOTAL) BY MOUTH DAILY. 30 tablet 7  . Rivaroxaban (XARELTO) 15 MG TABS tablet Take 1 tablet by mouth 2 (two) times daily. Take 1 tab twice daily     No current facility-administered medications for this visit.    LABS/IMAGING: No results found for this or any previous visit (from the past 48 hour(s)). No results found.  VITALS: BP 110/86 mmHg  Pulse 86  Ht 5\' 7"  (1.702 m)  Wt 224 lb (101.606 kg)  BMI 35.08 kg/m2  EXAM: General appearance: alert and no distress Neck: no carotid bruit, no JVD and thyroid not enlarged, symmetric, no tenderness/mass/nodules Lungs: clear to auscultation bilaterally Heart: regular rate and rhythm, S1, S2 normal, no murmur, click, rub or gallop Abdomen: soft, non-tender; bowel sounds normal; no masses,  no organomegaly Extremities: extremities normal, atraumatic, no cyanosis or edema Pulses: 2+ and symmetric Skin: Skin color, texture, turgor normal. No rashes or lesions Neurologic:  Grossly normal Psych: Normal  EKG: Atrial fibrillation with controlled ventricular response at 86  ASSESSMENT:  1. Tobacco abuse 2. CAD s/p PCi to the entire RCA (50+ mm) with DES in 02/2013 and mid-LAD 02/2013 3. Paroxysmal atrial fibrillation - now recurrent and persistent 4. OSA on CPAP 5. Low back pain, difficulty walking 6. Pulmonary embolus of unknown etiology  PLAN: 1.   Paul Todd is recovered from coronary stenting in February 2015 however was on Xarelto for atrial fibrillation. I took this off for him to be able to have back injections however subsequently he developed acute pulmonary embolus and is now currently back on the medication. He seems to be recovering from that but has fatigue and is noted to be back in atrial fibrillation. At this point I encouraged him to stay on Xarelto and after one month of anticoagulation, I would like to bring him back for an EKG to see if he remains in A. fib. If so we should set him up for an elective cardioversion.  Pixie Casino, MD, [facility] Attending Cardiologist [place] Kysean Sweet 06/20/2014, 5:43 PM

## 2014-06-27 ENCOUNTER — Ambulatory Visit (INDEPENDENT_AMBULATORY_CARE_PROVIDER_SITE_OTHER): Payer: Commercial Managed Care - PPO | Admitting: *Deleted

## 2014-06-27 DIAGNOSIS — I4819 Other persistent atrial fibrillation: Secondary | ICD-10-CM

## 2014-06-27 DIAGNOSIS — I481 Persistent atrial fibrillation: Secondary | ICD-10-CM

## 2014-06-27 NOTE — Patient Instructions (Signed)
EKG reviewed by Dr. Gwenlyn Found - atrial fibrillation Chart reviewed and [name] advised elective cardioversion - unsure if with or without TEE Patient is on week 4 of being on xarelto.  Informed patient that I feel uncomfortable setting up cardioversion with MD approval, given that I was not present for his office visit. He voiced understanding.  Patient aware I will call him on Monday with information.

## 2014-07-03 ENCOUNTER — Telehealth: Payer: Self-pay | Admitting: *Deleted

## 2014-07-03 ENCOUNTER — Encounter: Payer: Self-pay | Admitting: Internal Medicine

## 2014-07-03 ENCOUNTER — Other Ambulatory Visit: Payer: Self-pay | Admitting: *Deleted

## 2014-07-03 DIAGNOSIS — D689 Coagulation defect, unspecified: Secondary | ICD-10-CM

## 2014-07-03 DIAGNOSIS — R5383 Other fatigue: Secondary | ICD-10-CM

## 2014-07-03 DIAGNOSIS — Z0189 Encounter for other specified special examinations: Secondary | ICD-10-CM

## 2014-07-03 DIAGNOSIS — Z01812 Encounter for preprocedural laboratory examination: Secondary | ICD-10-CM

## 2014-07-03 DIAGNOSIS — I4891 Unspecified atrial fibrillation: Secondary | ICD-10-CM

## 2014-07-03 DIAGNOSIS — I4819 Other persistent atrial fibrillation: Secondary | ICD-10-CM

## 2014-07-03 NOTE — Telephone Encounter (Signed)
Patient notified that [name] has ordered cardioversion. Patient is aware to have lab work 3-5 days prior to procedure at [facility].   Staff message sent to [facility] to schedule patient

## 2014-07-03 NOTE — Telephone Encounter (Signed)
Patient will pick up instructions for cardioversion scheduled for 07/07/14 at 2:00 pm with [name].

## 2014-07-03 NOTE — Telephone Encounter (Signed)
LM for patient to call back - per [name], [place] to schedule cardioversion.

## 2014-07-03 NOTE — Telephone Encounter (Signed)
Left message for patient to call and schedule cardioversion ordered by [name].

## 2014-07-04 LAB — BASIC METABOLIC PANEL
BUN: 17 mg/dL (ref 6–23)
CHLORIDE: 106 meq/L (ref 96–112)
CO2: 23 meq/L (ref 19–32)
CREATININE: 0.87 mg/dL (ref 0.50–1.35)
Calcium: 8.7 mg/dL (ref 8.4–10.5)
Glucose, Bld: 101 mg/dL — ABNORMAL HIGH (ref 70–99)
POTASSIUM: 4.6 meq/L (ref 3.5–5.3)
SODIUM: 142 meq/L (ref 135–145)

## 2014-07-04 LAB — CBC
HCT: 45.4 % (ref 39.0–52.0)
Hemoglobin: 15.5 g/dL (ref 13.0–17.0)
MCH: 31.6 pg (ref 26.0–34.0)
MCHC: 34.1 g/dL (ref 30.0–36.0)
MCV: 92.5 fL (ref 78.0–100.0)
MPV: 10.6 fL (ref 8.6–12.4)
PLATELETS: 193 10*3/uL (ref 150–400)
RBC: 4.91 MIL/uL (ref 4.22–5.81)
RDW: 14.1 % (ref 11.5–15.5)
WBC: 10.9 10*3/uL — AB (ref 4.0–10.5)

## 2014-07-04 LAB — APTT: aPTT: 34 seconds (ref 24–37)

## 2014-07-04 LAB — PROTIME-INR
INR: 1.27 (ref <1.50)
Prothrombin Time: 15.9 seconds — ABNORMAL HIGH (ref 11.6–15.2)

## 2014-07-04 LAB — TSH: TSH: 1.251 u[IU]/mL (ref 0.350–4.500)

## 2014-07-06 ENCOUNTER — Ambulatory Visit: Payer: Self-pay | Admitting: Internal Medicine

## 2014-07-06 MED ORDER — SODIUM CHLORIDE 0.9 % IV SOLN
INTRAVENOUS | Status: DC
  Administered 2014-07-07: 500 mL via INTRAVENOUS
  Administered 2014-07-07: 13:00:00 via INTRAVENOUS

## 2014-07-07 ENCOUNTER — Encounter (HOSPITAL_COMMUNITY): Payer: Self-pay

## 2014-07-07 ENCOUNTER — Ambulatory Visit (HOSPITAL_COMMUNITY): Payer: Commercial Managed Care - PPO | Admitting: Anesthesiology

## 2014-07-07 ENCOUNTER — Ambulatory Visit (HOSPITAL_COMMUNITY)
Admission: RE | Admit: 2014-07-07 | Discharge: 2014-07-07 | Disposition: A | Discharge status: Home / Self Care | Payer: Commercial Managed Care - PPO | Source: Ambulatory Visit | Attending: Internal Medicine | Admitting: Internal Medicine

## 2014-07-07 ENCOUNTER — Encounter (HOSPITAL_COMMUNITY)
Admission: RE | Disposition: A | Discharge status: Home / Self Care | Payer: Self-pay | Source: Ambulatory Visit | Attending: Internal Medicine

## 2014-07-07 DIAGNOSIS — I10 Essential (primary) hypertension: Secondary | ICD-10-CM | POA: Insufficient documentation

## 2014-07-07 DIAGNOSIS — I481 Persistent atrial fibrillation: Secondary | ICD-10-CM | POA: Diagnosis not present

## 2014-07-07 DIAGNOSIS — Z79899 Other long term (current) drug therapy: Secondary | ICD-10-CM | POA: Diagnosis not present

## 2014-07-07 DIAGNOSIS — I4891 Unspecified atrial fibrillation: Secondary | ICD-10-CM | POA: Diagnosis not present

## 2014-07-07 DIAGNOSIS — Z87891 Personal history of nicotine dependence: Secondary | ICD-10-CM | POA: Diagnosis not present

## 2014-07-07 DIAGNOSIS — I4819 Other persistent atrial fibrillation: Secondary | ICD-10-CM

## 2014-07-07 DIAGNOSIS — Z0189 Encounter for other specified special examinations: Secondary | ICD-10-CM | POA: Insufficient documentation

## 2014-07-07 HISTORY — PX: CARDIOVERSION: SHX1299

## 2014-07-07 SURGERY — CARDIOVERSION
Anesthesia: Monitor Anesthesia Care

## 2014-07-07 MED ORDER — SODIUM CHLORIDE 0.9 % IJ SOLN: 3.0000 mL | INTRAMUSCULAR | Status: DC | PRN

## 2014-07-07 MED ORDER — PROPOFOL 10 MG/ML IV BOLUS
INTRAVENOUS | Status: DC | PRN
  Administered 2014-07-07: 160 mg via INTRAVENOUS

## 2014-07-07 MED ORDER — LIDOCAINE HCL (CARDIAC) 20 MG/ML IV SOLN
INTRAVENOUS | Status: DC | PRN
  Administered 2014-07-07: 30 mg via INTRAVENOUS

## 2014-07-07 NOTE — Transfer of Care (Signed)
Immediate Anesthesia Transfer of Care Note  Patient: Paul Todd  Procedure(s) Performed: Procedure(s): CARDIOVERSION (N/A)  Patient Location: Endoscopy Unit  Anesthesia Type:MAC  Level of Consciousness: awake  Airway & Oxygen Therapy: Patient Spontanous Breathing and Patient connected to nasal cannula oxygen  Post-op Assessment: Report given to RN and Post -op Vital signs reviewed and stable  Post vital signs: Reviewed and stable  Last Vitals:  Filed Vitals:   07/07/14 1343  BP: 98/75  Pulse: 77  Temp:   Resp:     Complications: No apparent anesthesia complications

## 2014-07-07 NOTE — H&P (Signed)
     INTERVAL PROCEDURE H&P  History and Physical Interval Note:  07/07/2014 12:55 PM  Paul Todd has presented today for their planned procedure. The various methods of treatment have been discussed with the patient and family. After consideration of risks, benefits and other options for treatment, the patient has consented to the procedure.  The patients' outpatient history has been reviewed, patient examined, and no change in status from most recent office note within the past 30 days. I have reviewed the patients' chart and labs and will proceed as planned. Questions were answered to the patient's satisfaction.   Pixie Casino, MD, [facility] Attending Cardiologist [place] 07/07/2014, 12:55 PM

## 2014-07-07 NOTE — Anesthesia Postprocedure Evaluation (Signed)
  Anesthesia Post-op Note  Patient: Paul Todd  Procedure(s) Performed: Procedure(s): CARDIOVERSION (N/A)  Patient Location: Endoscopy  Anesthesia Type:General  Level of Consciousness: awake, alert  and oriented  Airway and Oxygen Therapy: Patient Spontanous Breathing and Patient connected to nasal cannula oxygen  Post-op Pain: None  Post-op Assessment: Post-op Vital signs reviewed, Patient's Cardiovascular Status Stable, Respiratory Function Stable and Patent Airway              Post-op Vital Signs: stable  Last Vitals:  Filed Vitals:   07/07/14 1410  BP: 118/85  Pulse: 71  Temp:   Resp: 21    Complications: No apparent anesthesia complications

## 2014-07-07 NOTE — CV Procedure (Signed)
    CARDIOVERSION NOTE  Procedure: Electrical Cardioversion Indications:  Atrial Fibrillation  Procedure Details:  Consent: Risks of procedure as well as the alternatives and risks of each were explained to the (patient/caregiver).  Consent for procedure obtained.  Time Out: Verified patient identification, verified procedure, site/side was marked, verified correct patient position, special equipment/implants available, medications/allergies/relevent history reviewed, required imaging and test results available.  Performed  Patient placed on cardiac monitor, pulse oximetry, supplemental oxygen as necessary.  Sedation given: Propofol per anesthesia Pacer pads placed anterior and posterior chest.  Cardioverted 3 time(s).  Cardioverted at 150J, 200J and 200J.  Impression: Findings: Post procedure EKG shows: Atrial Fibrillation Complications: None Patient did tolerate procedure well.  Plan: 1. Unsuccessful DCCV after 3 attempts. 2. Will refer to Dr. Rayann Heman for evaluation of antiarrythmic therapy or possible ablation.  Time Spent Directly with the Patient:  30 minutes   Pixie Casino, MD, [facility] Attending Cardiologist [place] 07/07/2014, 1:50 PM

## 2014-07-07 NOTE — Anesthesia Preprocedure Evaluation (Signed)
Anesthesia Evaluation  Patient identified by MRN, date of birth, ID band Patient awake    Reviewed: Allergy & Precautions, NPO status , Patient's Chart, lab work & pertinent test results  Airway Mallampati: II  TM Distance: >3 FB Neck ROM: Full    Dental  (+) Teeth Intact   Pulmonary former smoker,  breath sounds clear to auscultation        Cardiovascular hypertension, Rhythm:Irregular Rate:Normal     Neuro/Psych    GI/Hepatic   Endo/Other    Renal/GU      Musculoskeletal   Abdominal   Peds  Hematology   Anesthesia Other Findings   Reproductive/Obstetrics                             Anesthesia Physical Anesthesia Plan  ASA: III  Anesthesia Plan: General   Post-op Pain Management:    Induction: Intravenous  Airway Management Planned: Mask  Additional Equipment:   Intra-op Plan:   Post-operative Plan:   Informed Consent: I have reviewed the patients History and Physical, chart, labs and discussed the procedure including the risks, benefits and alternatives for the proposed anesthesia with the patient or authorized representative who has indicated his/her understanding and acceptance.     Plan Discussed with: CRNA and Anesthesiologist  Anesthesia Plan Comments:         Anesthesia Quick Evaluation

## 2014-07-07 NOTE — Discharge Instructions (Signed)

## 2014-07-11 ENCOUNTER — Encounter (HOSPITAL_COMMUNITY): Payer: Self-pay | Admitting: Internal Medicine

## 2014-07-13 ENCOUNTER — Encounter: Payer: Self-pay | Admitting: Internal Medicine

## 2014-07-13 ENCOUNTER — Ambulatory Visit (INDEPENDENT_AMBULATORY_CARE_PROVIDER_SITE_OTHER): Payer: Commercial Managed Care - PPO | Admitting: Internal Medicine

## 2014-07-13 VITALS — BP 124/78 | HR 83 | Ht 67.0 in | Wt 229.0 lb

## 2014-07-13 DIAGNOSIS — G4733 Obstructive sleep apnea (adult) (pediatric): Secondary | ICD-10-CM | POA: Diagnosis not present

## 2014-07-13 DIAGNOSIS — I481 Persistent atrial fibrillation: Secondary | ICD-10-CM | POA: Diagnosis not present

## 2014-07-13 DIAGNOSIS — I2510 Atherosclerotic heart disease of native coronary artery without angina pectoris: Secondary | ICD-10-CM

## 2014-07-13 DIAGNOSIS — Z9861 Coronary angioplasty status: Secondary | ICD-10-CM | POA: Diagnosis not present

## 2014-07-13 DIAGNOSIS — I4819 Other persistent atrial fibrillation: Secondary | ICD-10-CM

## 2014-07-13 NOTE — Patient Instructions (Signed)
Medication Instructions: - no changes today  Labwork: - none  Procedures/Testing: - Elberta Leatherwood, Pharm D will be in touch with you in the near future about an admission date to start Tikosyn.  Follow-Up: - pending hospital discharge.  Any Additional Special Instructions Will Be Listed Below (If Applicable). - none

## 2014-07-14 ENCOUNTER — Telehealth: Payer: Self-pay | Admitting: Internal Medicine

## 2014-07-14 MED ORDER — RIVAROXABAN 20 MG PO TABS: 20.0000 mg | ORAL_TABLET | Freq: Every day | ORAL | Status: DC

## 2014-07-14 NOTE — Telephone Encounter (Signed)
Refill submitted to patient's preferred pharmacy. Informed patient. Pt voiced understanding, no other stated concerns at this time.  

## 2014-07-14 NOTE — Telephone Encounter (Signed)
°  1. Which medications need to be refilled? Xarelto 20 mg   2. Which pharmacy is medication to be sent to?[facility] on [address]   3. Do they need a 30 day or 90 day supply? 30  4. Would they like a call back once the medication has been sent to the pharmacy? Yes

## 2014-07-16 NOTE — Progress Notes (Signed)
Electrophysiology Office Note   Date:  07/16/2014   ID:  Paul Todd, DOB Jan 27, 1960, MRN [id number]  PCP:  Imelda Pillow, NP  Cardiologist:  Dr Debara Pickett Primary Electrophysiologist: Thompson Grayer, MD    Chief Complaint  Patient presents with  . Atrial Fibrillation     History of Present Illness: Paul Todd is a 54 y.o. male who presents today for electrophysiology evaluation.   The patient has previously been found to have afib after presenting in [place] with a kidney stone.  He was unaware of afib at the time.  He was referred to Dr Debara Pickett for evaluated.  He had a nuclear stress test which revealed EF 48% with no ischemia.  He subsequently presented with nausea and NSTEMI.  During a vagal event, he was noted to have AV block which improved with atropine.   His transient AV block was felt to be vagal in nature.  Cardiac cath at that time revealed an acutely occluded RCA which was successfully treated with PTCA and diffuse stenting due to RCA stenoses beyond the point of total occlusion. The patient also had high-grade concomitant proximal LAD disease which required staged PCI. Left ventriculography revealed an ejection fraction of ~50%. There was evidence for mild mid and distal inferior hypocontractility as well. The patient developed atrial fibrillation during the procedure which was rate controlled with an IV beta blocker and then a low dose oral agent. He underwent staged PCI to LAD on 02/14/13 with successful PTCA/DES x 1 to proximal LAD. It was felt that the patient would not tolerate atrial fibrillation well and underwent successful TEE/DCCV on 02/15/13.  He was subsequently diagnosed with OSA and started on CPAP.  ETOH has also been discontinued.  He more recently was diagnosed with PTE at [facility].  He was found to have recurrent afib also.  He reports some fatigue but is mostly asymptomatic with afib.  He remains active.  He underwent repeat cardioversion 07/07/14 off of AAD which  was unsuccessful.  Today, he denies symptoms of palpitations, orthopnea, PND, lower extremity edema, claudication, dizziness, presyncope, syncope, bleeding, or neurologic sequela. The patient is tolerating medications without difficulties and is otherwise without complaint today.    Past Medical History  Diagnosis Date  . PAF (paroxysmal atrial fibrillation)     during a kidney stone attack  . Dyslipidemia   . CAD (coronary artery disease)     multivessel per cath 02/12/13, s/p stenting to RCA  . Hypertension   . Nausea & vomiting   . PONV (postoperative nausea and vomiting)   . Myocardial infarction     02/12/2013   . Old myocardial infarction   . Dysrhythmia     hx of atrial fib   . Sleep apnea     cpap recently   . Nephrolithiasis   . Arthritis     all over   Past Surgical History  Procedure Laterality Date  . Tee without cardioversion N/A 02/15/2013    Procedure: TRANSESOPHAGEAL ECHOCARDIOGRAM (TEE);  Surgeon: Thayer Headings, MD;  Location: [place];  Service: Cardiovascular;  Laterality: N/A;  . Cardioversion N/A 02/15/2013    Procedure: CARDIOVERSION;  Surgeon: Thayer Headings, MD;  Location: [facility] ENDOSCOPY;  Service: Cardiovascular;  Laterality: N/A;  . Coronary angioplasty with stent placement  02/12/2013  . Cystoscopy with retrograde pyelogram, ureteroscopy and stent placement Left 09/02/2013    Procedure: CYSTOSCOPY WITH RETROGRADE PYELOGRAM/LEFT URETEROSCOPY/URETERAL STENT, with fulguration;  Surgeon: Festus Aloe, MD;  Location: [facility];  Service: Urology;  Laterality: Left;  . Cystoscopy with ureteroscopy and stent placement Left 09/16/2013    Procedure: CYSTOSCOPY WITH URETEROSCOPY AND STENT PLACEMENT;  Surgeon: Festus Aloe, MD;  Location: [facility];  Service: Urology;  Laterality: Left;  . Holmium laser application Left 4/68/0321    Procedure: HOLMIUM LASER APPLICATION;  Surgeon: Festus Aloe, MD;  Location: [facility];  Service: Urology;  Laterality: Left;  . Left  heart catheterization with coronary angiogram N/A 02/12/2013    Procedure: LEFT HEART CATHETERIZATION WITH CORONARY ANGIOGRAM;  Surgeon: Troy Sine, MD;  Location: [facility] CATH LAB;  Service: Cardiovascular;  Laterality: N/A;  . Percutaneous coronary stent intervention (pci-s)  02/12/2013    Procedure: PERCUTANEOUS CORONARY STENT INTERVENTION (PCI-S);  Surgeon: Troy Sine, MD;  Location: [facility] CATH LAB;  Service: Cardiovascular;;  STEMI  . Percutaneous coronary stent intervention (pci-s) N/A 02/14/2013    Procedure: PERCUTANEOUS CORONARY STENT INTERVENTION (PCI-S);  Surgeon: Burnell Blanks, MD;  Location: [facility] CATH LAB;  Service: Cardiovascular;  Laterality: N/A;  Proximal LAD  . Cardioversion N/A 07/07/2014    Procedure: CARDIOVERSION;  Surgeon: Pixie Casino, MD;  Location: [facility] ENDOSCOPY;  Service: Cardiovascular;  Laterality: N/A;     Current Outpatient Prescriptions  Medication Sig Dispense Refill  . acetaminophen (TYLENOL) 650 MG CR tablet Take 650 mg by mouth every 8 (eight) hours as needed for pain.    Marland Kitchen aspirin EC 81 MG tablet Take 81 mg by mouth daily.    Marland Kitchen atorvastatin (LIPITOR) 80 MG tablet Take 1 tablet (80 mg total) by mouth daily. 90 tablet 3  . DULoxetine (CYMBALTA) 30 MG capsule Take 30 mg by mouth daily.     Marland Kitchen HYDROcodone-acetaminophen (NORCO) 10-325 MG per tablet Take 1 tablet by mouth every 6 (six) hours as needed (pain).     . metoprolol succinate (TOPROL-XL) 50 MG 24 hr tablet Take 50 mg by mouth daily. Take with or immediately following a meal.    . nitroGLYCERIN (NITROSTAT) 0.4 MG SL tablet Place 0.4 mg under the tongue every 5 (five) minutes as needed for chest pain (MAX 3 TABLETS).     . NON FORMULARY at bedtime. CPAP    . pantoprazole (PROTONIX) 40 MG tablet TAKE 1 TABLET (40 MG TOTAL) BY MOUTH DAILY. 30 tablet 7  . rivaroxaban (XARELTO) 20 MG TABS tablet Take 1 tablet (20 mg total) by mouth daily with supper. 30 tablet 5   No current facility-administered medications  for this visit.    Allergies:   Review of patient's allergies indicates no known allergies.   Social History:  The patient  reports that he quit smoking about 17 months ago. His smoking use included Cigarettes. He has a 17.5 pack-year smoking history. He has never used smokeless tobacco. He reports that he drinks alcohol. He reports that he does not use illicit drugs.   Family History:  The patient's  family history includes Diabetes in his father and mother; Heart disease in his mother.    ROS:  Please see the history of present illness.   All other systems are reviewed and negative.    PHYSICAL EXAM: VS:  BP 124/78 mmHg  Pulse 83  Ht 5\' 7"  (1.702 m)  Wt 103.874 kg (229 lb)  BMI 35.86 kg/m2 , BMI Body mass index is 35.86 kg/(m^2). GEN: Well nourished, well developed, in no acute distress HEENT: normal Neck: no JVD, carotid bruits, or masses Cardiac: iRRR; no murmurs, rubs, or gallops,no edema  Respiratory:  clear to auscultation bilaterally, normal work of breathing GI: soft, nontender, nondistended, + BS MS: no deformity or atrophy Skin: warm and dry  Neuro:  Strength and sensation are intact Psych: euthymic mood, full affect  EKG:  EKG is ordered today. The ekg ordered today shows afib, QTc <440   Recent Labs: 09/06/2013: ALT 24 07/03/2014: BUN 17; Creat 0.87; Hemoglobin 15.5; Platelets 193; Potassium 4.6; Sodium 142; TSH 1.251    Lipid Panel     Component Value Date/Time   CHOL 195 01/28/2013 0939   CHOL 203* 11/23/2012 0949   TRIG 206* 01/28/2013 0939   TRIG 229* 11/23/2012 0949   HDL 43 01/28/2013 0939   HDL 37* 11/23/2012 0949   CHOLHDL 5.5 11/23/2012 0949   VLDL 46* 11/23/2012 0949   LDLCALC 111* 01/28/2013 0939   LDLCALC 120* 11/23/2012 0949     Wt Readings from Last 3 Encounters:  07/13/14 103.874 kg (229 lb)  07/07/14 101.606 kg (224 lb)  06/20/14 101.606 kg (224 lb)      Other studies Reviewed: Additional studies/ records that were reviewed  today include: Dr Rod Mae notes, echo 02/2013  Review of the above records today demonstrates: preserved EF, normal LA size   ASSESSMENT AND PLAN:  1.  Persistent atrial fibrillation The patient has symptomatic persistent atrial fibrillation.  He is appropriately anticoagulated. chads2vasc score is at least 2.  Therapeutic strategies for afib including rate and rhythm control were discussed in detail with the patient today.  He would like to try AAD therapy.  Given his CAD, his options are limited.  We discussed pros and cons of both sotalol and tikosyn today.  He would prefer tikosyn.  I will therefore refer to our [place] clinic to arrange initiation.  Given persistent afib, guidelines would support AAD therapy prior to consideration of ablation. Importance of lifestyle modification including ETOh avoidance and CPAP compliance were discussed today.  2. OSA As above  3. CAD No ischemic symptoms No changes today  4. HTN Stable No change required today  Follow-up with Roderic Palau NP in the AF clinic and Dr Debara Pickett post initiation of tikosyn.  I will see when needed going forward.  Current medicines are reviewed at length with the patient today.   The patient does not have concerns regarding his medicines.  The following changes were made today:  none  Labs/ tests ordered today include:  Orders Placed This Encounter  Procedures  . EKG 12-Lead     Signed, Thompson Grayer, MD  07/16/2014 12:36 PM     [address] [place] [address] [phone] (office) [phone] (fax)

## 2014-07-21 ENCOUNTER — Inpatient Hospital Stay (HOSPITAL_COMMUNITY)
Admission: AD | Admit: 2014-07-21 | Discharge: 2014-07-25 | DRG: 310 | Disposition: A | Discharge status: Home / Self Care | Payer: Commercial Managed Care - PPO | Source: Ambulatory Visit | Attending: Internal Medicine | Admitting: Internal Medicine

## 2014-07-21 ENCOUNTER — Ambulatory Visit (INDEPENDENT_AMBULATORY_CARE_PROVIDER_SITE_OTHER): Payer: Commercial Managed Care - PPO | Admitting: Pharmacist

## 2014-07-21 ENCOUNTER — Encounter (HOSPITAL_COMMUNITY): Payer: Self-pay | Admitting: Nurse Practitioner

## 2014-07-21 DIAGNOSIS — E669 Obesity, unspecified: Secondary | ICD-10-CM | POA: Diagnosis present

## 2014-07-21 DIAGNOSIS — G4733 Obstructive sleep apnea (adult) (pediatric): Secondary | ICD-10-CM | POA: Diagnosis present

## 2014-07-21 DIAGNOSIS — Z7982 Long term (current) use of aspirin: Secondary | ICD-10-CM | POA: Diagnosis not present

## 2014-07-21 DIAGNOSIS — E785 Hyperlipidemia, unspecified: Secondary | ICD-10-CM | POA: Diagnosis present

## 2014-07-21 DIAGNOSIS — I480 Paroxysmal atrial fibrillation: Secondary | ICD-10-CM

## 2014-07-21 DIAGNOSIS — I214 Non-ST elevation (NSTEMI) myocardial infarction: Secondary | ICD-10-CM | POA: Diagnosis not present

## 2014-07-21 DIAGNOSIS — I10 Essential (primary) hypertension: Secondary | ICD-10-CM | POA: Diagnosis present

## 2014-07-21 DIAGNOSIS — I493 Ventricular premature depolarization: Secondary | ICD-10-CM | POA: Diagnosis present

## 2014-07-21 DIAGNOSIS — I481 Persistent atrial fibrillation: Principal | ICD-10-CM

## 2014-07-21 DIAGNOSIS — I2510 Atherosclerotic heart disease of native coronary artery without angina pectoris: Secondary | ICD-10-CM | POA: Diagnosis present

## 2014-07-21 DIAGNOSIS — I4819 Other persistent atrial fibrillation: Secondary | ICD-10-CM | POA: Diagnosis present

## 2014-07-21 HISTORY — DX: Low back pain, unspecified: M54.50

## 2014-07-21 HISTORY — DX: Other persistent atrial fibrillation: I48.19

## 2014-07-21 HISTORY — DX: Low back pain: M54.5

## 2014-07-21 HISTORY — DX: Obstructive sleep apnea (adult) (pediatric): G47.33

## 2014-07-21 HISTORY — DX: Dependence on other enabling machines and devices: Z99.89

## 2014-07-21 HISTORY — DX: Other pulmonary embolism without acute cor pulmonale: I26.99

## 2014-07-21 HISTORY — DX: Other chronic pain: G89.29

## 2014-07-21 LAB — BASIC METABOLIC PANEL
BUN: 20 mg/dL (ref 6–23)
CALCIUM: 9 mg/dL (ref 8.4–10.5)
CHLORIDE: 109 meq/L (ref 96–112)
CO2: 27 meq/L (ref 19–32)
Creatinine, Ser: 0.87 mg/dL (ref 0.40–1.50)
GFR: 96.87 mL/min (ref >60.00)
Glucose, Bld: 104 mg/dL — ABNORMAL HIGH (ref 70–99)
POTASSIUM: 4.4 meq/L (ref 3.5–5.1)
Sodium: 142 mEq/L (ref 135–145)

## 2014-07-21 LAB — MAGNESIUM
MAGNESIUM: 1.7 mg/dL (ref 1.5–2.5)
MAGNESIUM: 2.5 mg/dL — AB (ref 1.7–2.4)

## 2014-07-21 MED ORDER — SODIUM CHLORIDE 0.9 % IV SOLN: 250.0000 mL | INTRAVENOUS | Status: DC | PRN

## 2014-07-21 MED ORDER — RIVAROXABAN 20 MG PO TABS
20.0000 mg | ORAL_TABLET | Freq: Every day | ORAL | Status: DC
  Administered 2014-07-21 – 2014-07-24 (×4): 20 mg via ORAL
  Filled 2014-07-21 (×5): qty 1

## 2014-07-21 MED ORDER — MAGNESIUM SULFATE 2 GM/50ML IV SOLN
2.0000 g | Freq: Once | INTRAVENOUS | Status: AC
  Administered 2014-07-21: 2 g via INTRAVENOUS
  Filled 2014-07-21: qty 50

## 2014-07-21 MED ORDER — ASPIRIN EC 81 MG PO TBEC
81.0000 mg | DELAYED_RELEASE_TABLET | Freq: Every day | ORAL | Status: DC
  Administered 2014-07-22 – 2014-07-25 (×4): 81 mg via ORAL
  Filled 2014-07-21 (×4): qty 1

## 2014-07-21 MED ORDER — OFF THE BEAT BOOK
Freq: Once | Status: AC
  Administered 2014-07-21
  Filled 2014-07-21: qty 1

## 2014-07-21 MED ORDER — SODIUM CHLORIDE 0.9 % IJ SOLN: 3.0000 mL | INTRAMUSCULAR | Status: DC | PRN

## 2014-07-21 MED ORDER — DOFETILIDE 500 MCG PO CAPS
500.0000 ug | ORAL_CAPSULE | Freq: Two times a day (BID) | ORAL | Status: DC
  Administered 2014-07-21 – 2014-07-24 (×6): 500 ug via ORAL
  Filled 2014-07-21 (×8): qty 1

## 2014-07-21 MED ORDER — PANTOPRAZOLE SODIUM 40 MG PO TBEC
40.0000 mg | DELAYED_RELEASE_TABLET | Freq: Every day | ORAL | Status: DC
  Administered 2014-07-22 – 2014-07-24 (×3): 40 mg via ORAL
  Filled 2014-07-21 (×5): qty 1

## 2014-07-21 MED ORDER — NITROGLYCERIN 0.4 MG SL SUBL: 0.4000 mg | SUBLINGUAL_TABLET | SUBLINGUAL | Status: DC | PRN

## 2014-07-21 MED ORDER — METOPROLOL SUCCINATE ER 50 MG PO TB24
50.0000 mg | ORAL_TABLET | Freq: Every day | ORAL | Status: DC
  Administered 2014-07-22 – 2014-07-24 (×3): 50 mg via ORAL
  Filled 2014-07-21 (×4): qty 1

## 2014-07-21 MED ORDER — HYDROCODONE-ACETAMINOPHEN 10-325 MG PO TABS
1.0000 | ORAL_TABLET | Freq: Four times a day (QID) | ORAL | Status: DC | PRN
  Administered 2014-07-21 – 2014-07-24 (×5): 1 via ORAL
  Filled 2014-07-21 (×5): qty 1

## 2014-07-21 MED ORDER — DULOXETINE HCL 30 MG PO CPEP
30.0000 mg | ORAL_CAPSULE | Freq: Every day | ORAL | Status: DC
  Administered 2014-07-22 – 2014-07-25 (×4): 30 mg via ORAL
  Filled 2014-07-21 (×4): qty 1

## 2014-07-21 MED ORDER — ATORVASTATIN CALCIUM 80 MG PO TABS
80.0000 mg | ORAL_TABLET | Freq: Every day | ORAL | Status: DC
  Administered 2014-07-22 – 2014-07-25 (×4): 80 mg via ORAL
  Filled 2014-07-21 (×4): qty 1

## 2014-07-21 MED ORDER — SODIUM CHLORIDE 0.9 % IJ SOLN
3.0000 mL | Freq: Two times a day (BID) | INTRAMUSCULAR | Status: DC
  Administered 2014-07-21 – 2014-07-24 (×8): 3 mL via INTRAVENOUS

## 2014-07-21 NOTE — Progress Notes (Signed)
Pt stable during shift, EKG placed in pt chart; family at bedside with call light within reach. Will report off to oncoming RN. Francis Gaines Sharice Harriss RN.

## 2014-07-21 NOTE — Progress Notes (Signed)
Pharmacy Review for Dofetilide (Tikosyn) Initiation  Admit Complaint: 54 y.o. male admitted 07/21/2014 with atrial fibrillation to be initiated on dofetilide.   Assessment:  Patient Exclusion Criteria: If any screening criteria checked as "Yes", then  patient  should NOT receive dofetilide until criteria item is corrected. If "Yes" please indicate correction plan.  YES  NO Patient  Exclusion Criteria Correction Plan  []  [x]  Baseline QTc interval is greater than or equal to 440 msec. IF above YES box checked dofetilide contraindicated unless patient has ICD; then may proceed if QTc 500-550 msec or with known ventricular conduction abnormalities may proceed with QTc 550-600 msec. QTc =   430 per EKG interpretation by Dr. Radford Pax in clinic 7/15   [x]  []  Magnesium level is less than 1.8 mEq/l : Last magnesium:  Lab Results  Component Value Date   MG 1.7 07/21/2014       Mg 2g IV ordered, repeat Mg due at 1800   []  [x]  Potassium level is less than 4 mEq/l : Last potassium:  Lab Results  Component Value Date   K 4.4 07/21/2014         []  [x]  Patient is known or suspected to have a digoxin level greater than 2 ng/ml: No results found for: DIGOXIN    []  [x]  Creatinine clearance less than 20 ml/min (calculated using Cockcroft-Gault, actual body weight and serum creatinine): Estimated Creatinine Clearance: 110.8 mL/min (by C-G formula based on Cr of 0.87).    []  [x]  Patient has received drugs known to prolong the QT intervals within the last 48 hours (phenothiazines, tricyclics or tetracyclic antidepressants, erythromycin, H-1 antihistamines, cisapride, fluoroquinolones, azithromycin). Drugs not listed above may have an, as yet, undetected potential to prolong the QT interval, updated information on QT prolonging agents is available at this website:QT prolonging agents   []  [x]  Patient received a dose of hydrochlorothiazide (Oretic) alone or in any combination including triamterene (Dyazide,  Maxzide) in the last 48 hours.   []  []  Patient received a medication known to increase dofetilide plasma concentrations prior to initial dofetilide dose:  . Trimethoprim (Primsol, Proloprim) in the last 36 hours . Verapamil (Calan, Verelan) in the last 36 hours or a sustained release dose in the last 72 hours . Megestrol (Megace) in the last 5 days  . Cimetidine (Tagamet) in the last 6 hours . Ketoconazole (Nizoral) in the last 24 hours . Itraconazole (Sporanox) in the last 48 hours  . Prochlorperazine (Compazine) in the last 36 hours    []  [x]  Patient is known to have a history of torsades de pointes; congenital or acquired long QT syndromes.   []  [x]  Patient has received a Class 1 antiarrhythmic with less than 2 half-lives since last dose. (Disopyramide, Quinidine, Procainamide, Lidocaine, Mexiletine, Flecainide, Propafenone)   []  [x]  Patient has received amiodarone therapy in the past 3 months or amiodarone level is greater than 0.3 ng/ml.    Patient has been appropriately anticoagulated with Xarelto for the past 30 days.  Ordering provider was confirmed at [email or web address] if they are not listed on the [place] Prescribers list.  Goal of Therapy: Follow renal function, electrolytes, potential drug interactions, and dose adjustment. Provide education and 1 week supply at discharge.  Plan:  [x]   Physician selected initial dose within range recommended for patients level of renal function - will monitor for response.  []   Physician selected initial dose outside of range recommended for patients level of renal function - will discuss if the dose  should be altered at this time.   Select One Calculated CrCl  Dose q12h  [x]  > 60 ml/min 500 mcg  []  40-60 ml/min 250 mcg  []  20-40 ml/min 125 mcg   2. Follow up QTc after the first 5 doses, renal function, electrolytes (K & Mg) daily x 3     days, dose adjustment, success of initiation and facilitate 1 week discharge supply as      clinically indicated.  3. Initiate Tikosyn education video (Call [phone] and ask for video # 116).  4. Place Enrollment Form on the chart for discharge supply of dofetilide.  Thank you for allowing pharmacy to be a part of this patients care team.  Rowe Robert Pharm.D., BCPS, AQ-Cardiology Clinical Pharmacist 07/21/2014 3:12 PM Pager: [phone] Phone: [phone]

## 2014-07-21 NOTE — Progress Notes (Signed)
Pt arrived to the unit as a direct admit from MD office; pt A&O x4; VSS, telemetry applied and verified; NP in to see pt; pt oriented to the unit, IV site established; pt in bed watching TV with call light within reach. Will closely monitor. Francis Gaines Adaliz Dobis RN.

## 2014-07-21 NOTE — Progress Notes (Signed)
Date:  07/21/2014   ID:  Paul Todd, DOB 08-23-1960, MRN [id number]  PCP:  Imelda Pillow, NP  Cardiologist:  Dr Debara Pickett Primary Electrophysiologist: Thompson Grayer, MD   Chief Complaint  Patient presents with  . Atrial Fibrillation    Tikosyn initiation      History of Present Illness: Paul Todd is a 54 y.o. male who presents today for Tikosyn initation.   The patient has previously been found to have afib in 2014 after presenting in [place] with a kidney stone.  He was unaware of afib at the time.  He was referred to Dr Debara Pickett for evaluation.  He presented with nausea and NSTEMI in Feb 2015.   Cardiac cath at that time revealed an acutely occluded RCA which was successfully treated with PTCA and diffuse stenting due to RCA stenoses beyond the point of total occlusion. The patient also had high-grade concomitant proximal LAD disease which required staged PCI. Left ventriculography revealed an ejection fraction of ~50%.  It was felt that the patient would not tolerate atrial fibrillation well and underwent successful TEE/DCCV on 02/15/13. He has also been diagnosed with OSA and uses a CPAP.  He was admitted to [facility] in May 2016 with new PE as well as recurrent afib.  He underwent repeat DCCV on 07/07/14, which was also unsuccessful.  At his visit with Dr. Rayann Heman on 7/7, they discussed options to help maintain NSR.  Plan was made to proceed with AAD, Tikosyn being drug of choice.    Discussed potential side effects with patient, including QTc prolongation.  He is aware to call the office if he misses more than 2 doses in row.  Reviewed medication list.  He is currently not taking any QTc prolongating or contraindicated medications.  He states he has been compliant with his Xarelto for the past 30 days.   EKG reviewed by Dr. Radford Pax.  Afib with vent rate of 89 bpm.  QTc 430 msec.      Past Medical History  Diagnosis Date  . PAF (paroxysmal atrial fibrillation)     during a kidney  stone attack  . Dyslipidemia   . CAD (coronary artery disease)     multivessel per cath 02/12/13, s/p stenting to RCA  . Hypertension   . Nausea & vomiting   . PONV (postoperative nausea and vomiting)   . Myocardial infarction     02/12/2013   . Old myocardial infarction   . Dysrhythmia     hx of atrial fib   . Sleep apnea     cpap recently   . Nephrolithiasis   . Arthritis     all over   Past Surgical History  Procedure Laterality Date  . Tee without cardioversion N/A 02/15/2013    Procedure: TRANSESOPHAGEAL ECHOCARDIOGRAM (TEE);  Surgeon: Thayer Headings, MD;  Location: [place];  Service: Cardiovascular;  Laterality: N/A;  . Cardioversion N/A 02/15/2013    Procedure: CARDIOVERSION;  Surgeon: Thayer Headings, MD;  Location: [facility] ENDOSCOPY;  Service: Cardiovascular;  Laterality: N/A;  . Coronary angioplasty with stent placement  02/12/2013  . Cystoscopy with retrograde pyelogram, ureteroscopy and stent placement Left 09/02/2013    Procedure: CYSTOSCOPY WITH RETROGRADE PYELOGRAM/LEFT URETEROSCOPY/URETERAL STENT, with fulguration;  Surgeon: Festus Aloe, MD;  [facility];  Service: Urology;  Laterality: Left;  . Cystoscopy with ureteroscopy and stent placement Left 09/16/2013    Procedure: CYSTOSCOPY WITH URETEROSCOPY AND STENT PLACEMENT;  Surgeon: Festus Aloe, MD;  Location[facility];  Service: Urology;  Laterality: Left;  . Holmium laser application Left 04/01/7122    Procedure: HOLMIUM LASER APPLICATION;  Surgeon: Festus Aloe, MD;  [facility];  Service: Urology;  Laterality: Left;  . Left heart catheterization with coronary angiogram N/A 02/12/2013    Procedure: LEFT HEART CATHETERIZATION WITH CORONARY ANGIOGRAM;  Surgeon: Troy Sine, MD;  Location: [facility] CATH LAB;  Service: Cardiovascular;  Laterality: N/A;  . Percutaneous coronary stent intervention (pci-s)  02/12/2013    Procedure: PERCUTANEOUS CORONARY STENT INTERVENTION (PCI-S);  Surgeon: Troy Sine, MD;   Location: [facility] CATH LAB;  Service: Cardiovascular;;  STEMI  . Percutaneous coronary stent intervention (pci-s) N/A 02/14/2013    Procedure: PERCUTANEOUS CORONARY STENT INTERVENTION (PCI-S);  Surgeon: Burnell Blanks, MD;  Location: [facility] CATH LAB;  Service: Cardiovascular;  Laterality: N/A;  Proximal LAD  . Cardioversion N/A 07/07/2014    Procedure: CARDIOVERSION;  Surgeon: Pixie Casino, MD;  Location: [facility] ENDOSCOPY;  Service: Cardiovascular;  Laterality: N/A;     Current Outpatient Prescriptions  Medication Sig Dispense Refill  . acetaminophen (TYLENOL) 650 MG CR tablet Take 650 mg by mouth every 8 (eight) hours as needed for pain.    Marland Kitchen aspirin EC 81 MG tablet Take 81 mg by mouth daily.    Marland Kitchen atorvastatin (LIPITOR) 80 MG tablet Take 1 tablet (80 mg total) by mouth daily. 90 tablet 3  . DULoxetine (CYMBALTA) 30 MG capsule Take 30 mg by mouth daily.     Marland Kitchen HYDROcodone-acetaminophen (NORCO) 10-325 MG per tablet Take 1 tablet by mouth every 6 (six) hours as needed (pain).     . metoprolol succinate (TOPROL-XL) 50 MG 24 hr tablet Take 50 mg by mouth daily. Take with or immediately following a meal.    . nitroGLYCERIN (NITROSTAT) 0.4 MG SL tablet Place 0.4 mg under the tongue every 5 (five) minutes as needed for chest pain (MAX 3 TABLETS).     . NON FORMULARY at bedtime. CPAP    . pantoprazole (PROTONIX) 40 MG tablet TAKE 1 TABLET (40 MG TOTAL) BY MOUTH DAILY. 30 tablet 7  . rivaroxaban (XARELTO) 20 MG TABS tablet Take 1 tablet (20 mg total) by mouth daily with supper. 30 tablet 5   No current facility-administered medications for this visit.    Allergies:   Review of patient's allergies indicates no known allergies.   ASSESSMENT AND PLAN:  1.  Persistent atrial fibrillation- Pt's QTc acceptable for Tikosyn.  His K was at goal but Mg slightly low at 1.7.  Discussed options with pt including supplementing Mg at the hospital versus home.  Pt would like to proceed with hospitalization given he has  already planned to be out of work this weekend.  Have called to get bed for pt.  He is aware to report to the hospital.     Cyndee Brightly, [facility]  07/21/2014 12:03 PM     [address] [place] [address] [phone] (office) [phone] (fax)

## 2014-07-21 NOTE — H&P (Signed)
ELECTROPHYSIOLOGY HISTORY AND PHYSICAL     Patient ID: NOVAK STGERMAINE MRN: [id number], DOB/AGE: 55-Oct-1962 55 y.o.  Admit date: 07/21/2014  Primary Physician: Imelda Pillow, NP Primary Cardiologist: Hilty Electrophysiologist: Allred  CC: Tikosyn initiation  HPI:  Paul Todd is a 54 y.o. male with a past medical history significant for persistent atrial fibrillation, CAD, hyperlipidemia, sleep apnea (onCPAP), and hypertension.  He has undergone previous cardioversions with reversion to atrial fibrillation.  His last cardioversion was 07/07/14 off AAD therapy and was not successful.    He is complaining of persistent fatigue and shortness of breath but denies chest pain, recent fevers, chills, nausea or vomiting. He reports compliance with Xarelto  Last echo 02/2013 demonstrated EF 55-60%, no RWMA, LA 35.  Echo at [facility] 02/2014 with normal LV function, trivial MR.   Past Medical History  Diagnosis Date  . Persistent atrial fibrillation        . Dyslipidemia   . CAD (coronary artery disease)     multivessel per cath 02/12/13, s/p stenting to RCA  . Hypertension   . Myocardial infarction     02/12/2013   . Old myocardial infarction   . Sleep apnea     a. on CPAP  . Nephrolithiasis   . Arthritis     all over     Surgical History:  Past Surgical History  Procedure Laterality Date  . Tee without cardioversion N/A 02/15/2013    Procedure: TRANSESOPHAGEAL ECHOCARDIOGRAM (TEE);  Surgeon: Thayer Headings, MD;  Location: [facility];  Service: Cardiovascular;  Laterality: N/A;  . Cardioversion N/A 02/15/2013    Procedure: CARDIOVERSION;  Surgeon: Thayer Headings, MD;  Location: [facility] ENDOSCOPY;  Service: Cardiovascular;  Laterality: N/A;  . Coronary angioplasty with stent placement  02/12/2013  . Cystoscopy with retrograde pyelogram, ureteroscopy and stent placement Left 09/02/2013    Procedure: CYSTOSCOPY WITH RETROGRADE PYELOGRAM/LEFT URETEROSCOPY/URETERAL STENT, with  fulguration;  Surgeon: Festus Aloe, MD;  Location: [facility];  Service: Urology;  Laterality: Left;  . Cystoscopy with ureteroscopy and stent placement Left 09/16/2013    Procedure: CYSTOSCOPY WITH URETEROSCOPY AND STENT PLACEMENT;  Surgeon: Festus Aloe, MD;  Location: [facility];  Service: Urology;  Laterality: Left;  . Holmium laser application Left 2/95/1884    Procedure: HOLMIUM LASER APPLICATION;  Surgeon: Festus Aloe, MD;  Location: [facility];  Service: Urology;  Laterality: Left;  . Left heart catheterization with coronary angiogram N/A 02/12/2013    Procedure: LEFT HEART CATHETERIZATION WITH CORONARY ANGIOGRAM;  Surgeon: Troy Sine, MD;  Location: [facility] CATH LAB;  Service: Cardiovascular;  Laterality: N/A;  . Percutaneous coronary stent intervention (pci-s)  02/12/2013    Procedure: PERCUTANEOUS CORONARY STENT INTERVENTION (PCI-S);  Surgeon: Troy Sine, MD;  Location: [facility] CATH LAB;  Service: Cardiovascular;;  STEMI  . Percutaneous coronary stent intervention (pci-s) N/A 02/14/2013    Procedure: PERCUTANEOUS CORONARY STENT INTERVENTION (PCI-S);  Surgeon: Burnell Blanks, MD;  Location: [facility] CATH LAB;  Service: Cardiovascular;  Laterality: N/A;  Proximal LAD  . Cardioversion N/A 07/07/2014    Procedure: CARDIOVERSION;  Surgeon: Pixie Casino, MD;  Location: [facility] ENDOSCOPY;  Service: Cardiovascular;  Laterality: N/A;     Prescriptions prior to admission  Medication Sig Dispense Refill Last Dose  . acetaminophen (TYLENOL) 650 MG CR tablet Take 650 mg by mouth every 8 (eight) hours as needed for pain.   Taking  . aspirin EC 81 MG tablet Take 81 mg by mouth daily.   Taking  .  atorvastatin (LIPITOR) 80 MG tablet Take 1 tablet (80 mg total) by mouth daily. 90 tablet 3 Taking  . DULoxetine (CYMBALTA) 30 MG capsule Take 30 mg by mouth daily.    Taking  . HYDROcodone-acetaminophen (NORCO) 10-325 MG per tablet Take 1 tablet by mouth every 6 (six) hours as needed (pain).    Taking  . metoprolol  succinate (TOPROL-XL) 50 MG 24 hr tablet Take 50 mg by mouth daily. Take with or immediately following a meal.   Taking  . nitroGLYCERIN (NITROSTAT) 0.4 MG SL tablet Place 0.4 mg under the tongue every 5 (five) minutes as needed for chest pain (MAX 3 TABLETS).    Taking  . NON FORMULARY at bedtime. CPAP   Taking  . pantoprazole (PROTONIX) 40 MG tablet TAKE 1 TABLET (40 MG TOTAL) BY MOUTH DAILY. 30 tablet 7 Taking  . rivaroxaban (XARELTO) 20 MG TABS tablet Take 1 tablet (20 mg total) by mouth daily with supper. 30 tablet 5 Taking    Inpatient Medications:   Allergies: No Known Allergies  History   Social History  . Marital Status: Married    Spouse Name: N/A  . Number of Children: N/A  . Years of Education: N/A   Occupational History  . Not on file.   Social History Main Topics  . Smoking status: Former Smoker -- 0.50 packs/day for 35 years    Types: Cigarettes    Quit date: 02/12/2013  . Smokeless tobacco: Never Used  . Alcohol Use: Yes     Comment: rare   . Drug Use: No  . Sexual Activity: Yes    Birth Control/ Protection: None   Other Topics Concern  . Not on file   Social History Narrative     Family History  Problem Relation Age of Onset  . Heart disease Mother   . Diabetes Mother   . Diabetes Father      Review of Systems: All other systems reviewed and are otherwise negative except as noted above.  Physical Exam: Filed Vitals:   07/21/14 1430  BP: 109/77  Pulse: 96  Temp: 98.3 F (36.8 C)  TempSrc: Oral  Resp: 20  Height: 5\' 7"  (1.702 m)  Weight: 226 lb 4.8 oz (102.649 kg)  SpO2: 98%    GEN- The patient is obese appearing, alert and oriented x 3 today.   HEENT: normocephalic, atraumatic; sclera clear, conjunctiva pink; hearing intact; oropharynx clear; neck supple  Lungs- Clear to ausculation bilaterally, normal work of breathing.  No wheezes, rales, rhonchi Heart- Irregular rate and rhythm  GI- soft, non-tender, non-distended, bowel sounds  present  Extremities- no clubbing, cyanosis, or edema; DP/PT/radial pulses 2+ bilaterally MS- no significant deformity or atrophy Skin- warm and dry, no rash or lesion Psych- euthymic mood, full affect Neuro- strength and sensation are intact  Labs:   Lab Results  Component Value Date   WBC 10.9* 07/03/2014   HGB 15.5 07/03/2014   HCT 45.4 07/03/2014   MCV 92.5 07/03/2014   PLT 193 07/03/2014     Recent Labs Lab 07/21/14 1005  NA 142  K 4.4  CL 109  CO2 27  BUN 20  CREATININE 0.87  CALCIUM 9.0  GLUCOSE 104*    PJA:SNKNLZ fibrillation, rate 83, QTc 423  TELEMETRY: atrial fibrillation with controlled ventricular response  Assessment/Plan: 1.  Persistent atrial fibrillation The patient has persistent atrial fibrillation and has failed cardioversion off AAD therapy.  He presents today for Tikosyn initiation.  Will start tikosyn 547mcg  twice daily after Magnesium repleted today.  Continue Xarelto for CHADS2VASC of at least 2 Keep K >3.9,Mg >1.8 If he remains in afib on Sunday, will need DCCV  2.  Sleep apnea Continue CPAP  3.  CAD No recent ischemic symptoms  4.  HTN Stable No change required today  5.  Obesity Weight loss encouraged     Signed, Chanetta Marshall, NP 07/21/2014 2:47 PM  EP Attending  Patient seen and examined. Agree with the documentation above. I agree with the findings on history, exam, data and plan. If he has not converted to NSR, he need DCCV on Sunday. Will follow QT interval and electrolytes.  Mikle Bosworth.D.

## 2014-07-21 NOTE — Progress Notes (Signed)
Pt uses cpap at bedtime at home. Pt refuses hospital cpap. States he will bring his from home tomorrow. Will continue to monitor.   Elzora Cullins Ronne Binning

## 2014-07-22 LAB — MAGNESIUM: Magnesium: 2.1 mg/dL (ref 1.7–2.4)

## 2014-07-22 LAB — BASIC METABOLIC PANEL
Anion gap: 6 (ref 5–15)
BUN: 16 mg/dL (ref 6–20)
CHLORIDE: 110 mmol/L (ref 101–111)
CO2: 24 mmol/L (ref 22–32)
CREATININE: 0.76 mg/dL (ref 0.61–1.24)
Calcium: 8.6 mg/dL — ABNORMAL LOW (ref 8.9–10.3)
GFR calc Af Amer: >60 mL/min (ref >60)
GFR calc non Af Amer: >60 mL/min (ref >60)
GLUCOSE: 116 mg/dL — AB (ref 65–99)
POTASSIUM: 4.2 mmol/L (ref 3.5–5.1)
Sodium: 140 mmol/L (ref 135–145)

## 2014-07-22 NOTE — Progress Notes (Signed)
Utilization Review Completed.  

## 2014-07-22 NOTE — Progress Notes (Signed)
Patient ID: JARRYD GRATZ, male   DOB: [birth year], 54 y.o.   MRN: [id number]    Patient Name: Paul Todd Date of Encounter: 07/22/2014     Active Problems:   Persistent atrial fibrillation    SUBJECTIVE  No complaints this morning.   CURRENT MEDS . aspirin EC  81 mg Oral Daily  . atorvastatin  80 mg Oral Daily  . dofetilide  500 mcg Oral BID  . DULoxetine  30 mg Oral Daily  . metoprolol succinate  50 mg Oral Daily  . pantoprazole  40 mg Oral Daily  . rivaroxaban  20 mg Oral Q supper  . sodium chloride  3 mL Intravenous Q12H    OBJECTIVE  Filed Vitals:   07/21/14 1430 07/21/14 2122 07/22/14 0514  BP: 109/77 113/78 98/62  Pulse: 96 76 51  Temp: 98.3 F (36.8 C) 97.8 F (36.6 C) 98 F (36.7 C)  TempSrc: Oral Oral Oral  Resp: 20 18 18   Height: 5\' 7"  (1.702 m)    Weight: 226 lb 4.8 oz (102.649 kg)    SpO2: 98% 100% 95%    Intake/Output Summary (Last 24 hours) at 07/22/14 0755 Last data filed at 07/21/14 2122  Gross per 24 hour  Intake    480 ml  Output      0 ml  Net    480 ml   Filed Weights   07/21/14 1430  Weight: 226 lb 4.8 oz (102.649 kg)    PHYSICAL EXAM  General: Pleasant man, NAD. Neuro: Alert and oriented X 3. Moves all extremities spontaneously. Psych: Normal affect. HEENT:  Normal  Neck: Supple without bruits or JVD. Lungs:  Resp regular and unlabored, CTA. Heart: RRR no s3, s4, or murmurs. Abdomen: Soft, non-tender, non-distended, BS + x 4.  Extremities: No clubbing, cyanosis or edema. DP/PT/Radials 2+ and equal bilaterally.  Accessory Clinical Findings  CBC No results for input(s): WBC, NEUTROABS, HGB, HCT, MCV, PLT in the last 72 hours. Basic Metabolic Panel  Recent Labs  07/21/14 1005 07/21/14 1807 07/22/14 0350  NA 142  --  140  K 4.4  --  4.2  CL 109  --  110  CO2 27  --  24  GLUCOSE 104*  --  116*  BUN 20  --  16  CREATININE 0.87  --  0.76  CALCIUM 9.0  --  8.6*  MG 1.7 2.5* 2.1   Liver Function Tests No  results for input(s): AST, ALT, ALKPHOS, BILITOT, PROT, ALBUMIN in the last 72 hours. No results for input(s): LIPASE, AMYLASE in the last 72 hours. Cardiac Enzymes No results for input(s): CKTOTAL, CKMB, CKMBINDEX, TROPONINI in the last 72 hours. BNP Invalid input(s): POCBNP D-Dimer No results for input(s): DDIMER in the last 72 hours. Hemoglobin A1C No results for input(s): HGBA1C in the last 72 hours. Fasting Lipid Panel No results for input(s): CHOL, HDL, LDLCALC, TRIG, CHOLHDL, LDLDIRECT in the last 72 hours. Thyroid Function Tests No results for input(s): TSH, T4TOTAL, T3FREE, THYROIDAB in the last 72 hours.  Invalid input(s): FREET3  TELE  Atrial fib with a RVR  ECG  Atrial fib with a controlled VR. QTC approx 480 on my review  Radiology/Studies  No results found.  ASSESSMENT AND PLAN 1. Atrial fib with a RVR 2. Admit for Tikosyn Plan - continue Tikosyn, follow electrolytes and ECG. Will plan DCCV if he does not convert tomorrow.  Gregg Taylor,M.D.  07/22/2014 7:55 AM

## 2014-07-23 LAB — BASIC METABOLIC PANEL
ANION GAP: 10 (ref 5–15)
BUN: 9 mg/dL (ref 6–20)
CO2: 22 mmol/L (ref 22–32)
Calcium: 8.9 mg/dL (ref 8.9–10.3)
Chloride: 107 mmol/L (ref 101–111)
Creatinine, Ser: 0.82 mg/dL (ref 0.61–1.24)
GFR calc non Af Amer: >60 mL/min (ref >60)
Glucose, Bld: 113 mg/dL — ABNORMAL HIGH (ref 65–99)
POTASSIUM: 4.2 mmol/L (ref 3.5–5.1)
Sodium: 139 mmol/L (ref 135–145)

## 2014-07-23 LAB — MAGNESIUM: Magnesium: 2 mg/dL (ref 1.7–2.4)

## 2014-07-23 MED ORDER — ZOLPIDEM TARTRATE 5 MG PO TABS
5.0000 mg | ORAL_TABLET | Freq: Every evening | ORAL | Status: DC | PRN
  Administered 2014-07-23 – 2014-07-24 (×2): 5 mg via ORAL
  Filled 2014-07-23 (×2): qty 1

## 2014-07-23 NOTE — Progress Notes (Signed)
Patient ID: NAVIN DOGAN, male   DOB: Jul 31, 1960, 54 y.o.   MRN: [id number]    Patient Name: Paul Todd Date of Encounter: 07/23/2014     Active Problems:   Persistent atrial fibrillation    SUBJECTIVE  No chest pain or sob.   CURRENT MEDS . aspirin EC  81 mg Oral Daily  . atorvastatin  80 mg Oral Daily  . dofetilide  500 mcg Oral BID  . DULoxetine  30 mg Oral Daily  . metoprolol succinate  50 mg Oral Daily  . pantoprazole  40 mg Oral Daily  . rivaroxaban  20 mg Oral Q supper  . sodium chloride  3 mL Intravenous Q12H    OBJECTIVE  Filed Vitals:   07/22/14 0514 07/22/14 1336 07/22/14 1942 07/23/14 0432  BP: 98/62 115/60 123/82 120/81  Pulse: 51 94 95 76  Temp: 98 F (36.7 C) 97.7 F (36.5 C) 97.5 F (36.4 C) 98.1 F (36.7 C)  TempSrc: Oral Oral Oral Oral  Resp: 18 16 18 18   Height:      Weight:      SpO2: 95% 99% 98% 99%    Intake/Output Summary (Last 24 hours) at 07/23/14 0748 Last data filed at 07/22/14 1700  Gross per 24 hour  Intake    480 ml  Output      0 ml  Net    480 ml   Filed Weights   07/21/14 1430  Weight: 226 lb 4.8 oz (102.649 kg)    PHYSICAL EXAM  General: Pleasant, NAD. Neuro: Alert and oriented X 3. Moves all extremities spontaneously. Psych: Normal affect. HEENT:  Normal  Neck: Supple without bruits or JVD. Lungs:  Resp regular and unlabored, CTA. Heart: RRR no s3, s4, or murmurs. Abdomen: Soft, non-tender, non-distended, BS + x 4.  Extremities: No clubbing, cyanosis or edema. DP/PT/Radials 2+ and equal bilaterally.  Accessory Clinical Findings  CBC No results for input(s): WBC, NEUTROABS, HGB, HCT, MCV, PLT in the last 72 hours. Basic Metabolic Panel  Recent Labs  07/22/14 0350 07/23/14 0301  NA 140 139  K 4.2 4.2  CL 110 107  CO2 24 22  GLUCOSE 116* 113*  BUN 16 9  CREATININE 0.76 0.82  CALCIUM 8.6* 8.9  MG 2.1 2.0   Liver Function Tests No results for input(s): AST, ALT, ALKPHOS, BILITOT, PROT,  ALBUMIN in the last 72 hours. No results for input(s): LIPASE, AMYLASE in the last 72 hours. Cardiac Enzymes No results for input(s): CKTOTAL, CKMB, CKMBINDEX, TROPONINI in the last 72 hours. BNP Invalid input(s): POCBNP D-Dimer No results for input(s): DDIMER in the last 72 hours. Hemoglobin A1C No results for input(s): HGBA1C in the last 72 hours. Fasting Lipid Panel No results for input(s): CHOL, HDL, LDLCALC, TRIG, CHOLHDL, LDLDIRECT in the last 72 hours. Thyroid Function Tests No results for input(s): TSH, T4TOTAL, T3FREE, THYROIDAB in the last 72 hours.  Invalid input(s): FREET3  TELE  Atrial fib to NSR at 5 a.m this morning  ECG Atrial fib with a controlled VR, QTC 470  Radiology/Studies  No results found.  ASSESSMENT AND PLAN  1. Atrial fib, persistent 2. S/p initiation of Tikosyn, with the patient returning to NSR this morning, QTC approx 470 last night Rec: observe today, follow QT and plan to discharge home tomorrow.  Sade Hollon,M.D.  07/23/2014 7:48 AM

## 2014-07-23 NOTE — Progress Notes (Signed)
Patient going in and out of ventricular bigeminy. VSS. Patient asymptomatic. MD notified. Will continue to monitor.  Domingo Dimes RN

## 2014-07-24 DIAGNOSIS — I214 Non-ST elevation (NSTEMI) myocardial infarction: Secondary | ICD-10-CM

## 2014-07-24 DIAGNOSIS — I493 Ventricular premature depolarization: Secondary | ICD-10-CM

## 2014-07-24 LAB — BASIC METABOLIC PANEL
Anion gap: 6 (ref 5–15)
BUN: 15 mg/dL (ref 6–20)
CO2: 23 mmol/L (ref 22–32)
Calcium: 8.8 mg/dL — ABNORMAL LOW (ref 8.9–10.3)
Chloride: 111 mmol/L (ref 101–111)
Creatinine, Ser: 0.92 mg/dL (ref 0.61–1.24)
GFR calc Af Amer: >60 mL/min (ref >60)
Glucose, Bld: 105 mg/dL — ABNORMAL HIGH (ref 65–99)
Potassium: 4.1 mmol/L (ref 3.5–5.1)
SODIUM: 140 mmol/L (ref 135–145)

## 2014-07-24 LAB — MAGNESIUM: MAGNESIUM: 1.8 mg/dL (ref 1.7–2.4)

## 2014-07-24 MED ORDER — MAGNESIUM SULFATE 2 GM/50ML IV SOLN
2.0000 g | Freq: Once | INTRAVENOUS | Status: AC
  Administered 2014-07-24: 2 g via INTRAVENOUS
  Filled 2014-07-24: qty 50

## 2014-07-24 MED ORDER — DOFETILIDE 250 MCG PO CAPS
250.0000 ug | ORAL_CAPSULE | Freq: Two times a day (BID) | ORAL | Status: DC
  Administered 2014-07-24 – 2014-07-25 (×2): 250 ug via ORAL
  Filled 2014-07-24 (×4): qty 1

## 2014-07-24 NOTE — Progress Notes (Signed)
Mg 1.8. NP notified. Advised to give tikosyn as scheduled and ordered magnesium sulfate IV. Will continue to monitor.   Domingo Dimes RN

## 2014-07-24 NOTE — Care Management Note (Addendum)
Case Management Note  Patient Details  Name: Paul Todd MRN: [id number]  Date of Birth: 1960/09/11  Subjective/Objective:      Pt admitted with persistent A Fib , loading Tikoysn            Action/Plan:  Pt will discharge home on Tikoysn.  CM will assist with initation of Tikoysn post discharge.   Expected Discharge Date:                  Expected Discharge Plan:  Home/Self Care  In-House Referral:     Discharge planning Services  CM Consult, Medication Assistance  Post Acute Care Choice:    Choice offered to:     DME Arranged:    DME Agency:     HH Arranged:    [place] Agency:     Status of Service:  Complete, will sign off  Medicare Important Message Given:   No Date Medicare IM Given:    Medicare IM give by:    Date Additional Medicare IM Given:    Additional Medicare Important Message give by:     Disposition Plan:  Home with self care  If discussed at Long Length of Stay Meetings, dates discussed:    Additional Comments: Pt discharged home today 07/25/14  CM submitted benefit check:  Called [facility] at [phone]. Talked to [facility]. Both TIKOSYN and its generic DOFETILIDE are covered, but both require PRIOR AUTHORIZATION. Prior Authorization phoneline is [phone]. The retail pharmacy co-payment for TIKOSYN, both 250 mcg and 500 mcg dosages will be $47.66. The retail pharmacy co-payment for the generic DOFETILIDE, both 250 mcg and 500 mcg dosages will be $5.00. Both medications available at several retail pharmacies.    [facility].   CM asked MD if pt could take generic form due to cost difference; MD declined, and stated the patient would go home on the brand name of the medication.  CM informed pt of the $47.66 monthly copay for Tikosyn.  Pt [facility] [address], CM was informed by pharmacy that 500 mcg for 30 days could be filled at this time, the 250 mcg could be ordered and delivered next day.  CM contacted [facility] on  [address], pharmacy can fill both dosages today.  CM communicated this to pt.and stressed the importance of taking prescription to pharmacy after discharge. Maryclare Labrador, RN 07/24/2014, 11:57 AM

## 2014-07-24 NOTE — Progress Notes (Signed)
Patient Name: Paul Todd      SUBJECTIVE:without problems of sob or cp  No dizziness  Past Medical History  Diagnosis Date  . Persistent atrial fibrillation        . Dyslipidemia   . CAD (coronary artery disease)     multivessel per cath 02/12/13, s/p stenting to RCA  . Hypertension   . Nephrolithiasis   . PE (pulmonary thromboembolism) 02/2014  . Myocardial infarction 02/12/2013   . OSA on CPAP   . Arthritis     all over  . Chronic lower back pain     Scheduled Meds:  Scheduled Meds: . aspirin EC  81 mg Oral Daily  . atorvastatin  80 mg Oral Daily  . dofetilide  500 mcg Oral BID  . DULoxetine  30 mg Oral Daily  . metoprolol succinate  50 mg Oral Daily  . pantoprazole  40 mg Oral Daily  . rivaroxaban  20 mg Oral Q supper  . sodium chloride  3 mL Intravenous Q12H   Continuous Infusions:  sodium chloride, HYDROcodone-acetaminophen, nitroGLYCERIN, sodium chloride, zolpidem    PHYSICAL EXAM Filed Vitals:   07/23/14 0432 07/23/14 1035 07/23/14 1440 07/23/14 2002  BP: 120/81 125/82 110/74 107/62  Pulse: 76 75 70 66  Temp: 98.1 F (36.7 C)  98.1 F (36.7 C) 98 F (36.7 C)  TempSrc: Oral  Oral Oral  Resp: 18  18 18   Height:      Weight:      SpO2: 99%  99% 98%    Well developed and nourished in no acute distress HENT normal Neck supple with JVP-flat Clear Regular rate and rhythm, no murmurs or gallops Abd-soft with active BS No Clubbing cyanosis edema Skin-warm and dry A & Oriented  Grossly normal sensory and motor function   TELEMETRY: Reviewed telemetry pt in NSR with some freq PVCs With appearance of RVOT origin   :    Intake/Output Summary (Last 24 hours) at 07/24/14 0935 Last data filed at 07/24/14 0730  Gross per 24 hour  Intake    960 ml  Output      0 ml  Net    960 ml    LABS: Basic Metabolic Panel:  Recent Labs Lab 07/21/14 1005  07/22/14 0350 07/23/14 0301 07/24/14 0411  NA 142  --  140 139 140  K 4.4  --   4.2 4.2 4.1  CL 109  --  110 107 111  CO2 27  --  24 22 23   GLUCOSE 104*  --  116* 113* 105*  BUN 20  --  16 9 15   CREATININE 0.87  --  0.76 0.82 0.92  CALCIUM 9.0  --  8.6* 8.9 8.8*  MG 1.7  < > 2.1 2.0 1.8  < > = values in this interval not displayed. Cardiac Enzymes:     ASSESSMENT AND PLAN:  Active Problems:   NSTEMI (non-ST elevated myocardial infarction)   Persistent atrial fibrillation   PVC (premature ventricular contraction)  The pt is maintaining NSR  Will discharge in home  ]PVCs were noted prior to initiation of dofetilide; the morphology suggests a benign and unrelated mechanism although triggered ectopy is somewhat concerning theoretically\  There has been a progressive increase in PVC burden over the loading but this is cooccuring with the resrotration of sinus and associated bradycardia which might also be contributing in a loss of overdrive pacing suppression mechaninsm  Hence we will decrease  the dofetilide and look for change in PVC burden, inferring if there is a significant change than the effect is drug related and if not discharge him in am  Prev EF was modestly impaired and so would arrange a 24 hr holter and repeat echo  Mg was low sort of so will discharge on Mag oxied 400 one daily    Signed, Virl Axe MD  07/24/2014

## 2014-07-25 ENCOUNTER — Encounter (HOSPITAL_COMMUNITY): Payer: Self-pay | Admitting: Nurse Practitioner

## 2014-07-25 DIAGNOSIS — I480 Paroxysmal atrial fibrillation: Secondary | ICD-10-CM

## 2014-07-25 LAB — BASIC METABOLIC PANEL
ANION GAP: 8 (ref 5–15)
BUN: 11 mg/dL (ref 6–20)
CO2: 24 mmol/L (ref 22–32)
Calcium: 8.6 mg/dL — ABNORMAL LOW (ref 8.9–10.3)
Chloride: 106 mmol/L (ref 101–111)
Creatinine, Ser: 0.84 mg/dL (ref 0.61–1.24)
GFR calc Af Amer: >60 mL/min (ref >60)
GFR calc non Af Amer: >60 mL/min (ref >60)
Glucose, Bld: 90 mg/dL (ref 65–99)
Potassium: 4.2 mmol/L (ref 3.5–5.1)
Sodium: 138 mmol/L (ref 135–145)

## 2014-07-25 LAB — CBC
HCT: 42.8 % (ref 39.0–52.0)
HEMOGLOBIN: 13.9 g/dL (ref 13.0–17.0)
MCH: 30.6 pg (ref 26.0–34.0)
MCHC: 32.5 g/dL (ref 30.0–36.0)
MCV: 94.3 fL (ref 78.0–100.0)
Platelets: 169 10*3/uL (ref 150–400)
RBC: 4.54 MIL/uL (ref 4.22–5.81)
RDW: 13.9 % (ref 11.5–15.5)
WBC: 10.6 10*3/uL — AB (ref 4.0–10.5)

## 2014-07-25 LAB — MAGNESIUM: MAGNESIUM: 1.9 mg/dL (ref 1.7–2.4)

## 2014-07-25 MED ORDER — MAGNESIUM OXIDE 400 (241.3 MG) MG PO TABS: 400.0000 mg | ORAL_TABLET | Freq: Every day | ORAL | Status: AC

## 2014-07-25 MED ORDER — DOFETILIDE 250 MCG PO CAPS
250.0000 ug | ORAL_CAPSULE | Freq: Two times a day (BID) | ORAL | Status: DC
Start: 2014-07-25 — End: 2014-07-28

## 2014-07-25 MED ORDER — MAGNESIUM OXIDE 400 (241.3 MG) MG PO TABS
400.0000 mg | ORAL_TABLET | Freq: Every day | ORAL | Status: DC
  Administered 2014-07-25: 400 mg via ORAL
  Filled 2014-07-25: qty 1

## 2014-07-25 NOTE — Progress Notes (Signed)
Patient heart rate upper 50's. BP 122/76 manually taken. Pt states that he is light headed this morning and this is new for him. Chanetta Marshall, RN notified. Beta blocker held.

## 2014-07-25 NOTE — Discharge Summary (Signed)
ELECTROPHYSIOLOGY PROCEDURE DISCHARGE SUMMARY    Patient ID: Paul Todd,  MRN: [id number], DOB/AGE: 1960/05/13 54 y.o.  Admit date: 07/21/2014 Discharge date: 07/25/2014  Primary Care Physician: Imelda Pillow, NP Primary Cardiologist: [facility] Electrophysiologist: [facility]  Primary Discharge Diagnosis:  1.  Persistent atrial fibrillation status post Tikosyn loading this admission  Secondary Discharge Diagnosis:  1.  CAD 2.  Hypertension 3.  OSA on CPAP 4.  Hyperlipidemia 5.  Obesity  No Known Allergies   Procedures This Admission:  1.  Tikosyn loading  Brief HPI: Paul Todd is a 54 y.o. male with a past medical history as noted above.  They were referred to EP in the outpatient setting for treatment options of atrial fibrillation.  Risks, benefits, and alternatives to Tikosyn were reviewed with the patient who wished to proceed.    Hospital Course:  The patient was admitted and Tikosyn was initiated.  Renal function and electrolytes were followed during the hospitalization.  Their QTc remained stable.  They were monitored until discharge on telemetry which demonstrated sinus rhythm with PVC's.  Because of PVC burden, Tikosyn dose was decreased to 224mcg with decrease in PVC burden.  On the day of discharge, they were examined by Dr Lovena Le who considered them stable for discharge to home.  Follow-up has been arranged with the AF clinic in 1 week and 4 weeks.  Physical Exam: Filed Vitals:   07/24/14 1032 07/24/14 1331 07/24/14 2041 07/25/14 0420  BP: 110/64 125/76 115/73 121/82  Pulse: 74 59 52 52  Temp:  98 F (36.7 C)  97.6 F (36.4 C)  TempSrc:  Oral Oral Oral  Resp:  18 19 18   Height:      Weight:      SpO2:  100% 99% 99%    GEN- The patient is well appearing, alert and oriented x 3 today.   HEENT: normocephalic, atraumatic; sclera clear, conjunctiva pink; hearing intact; oropharynx clear; neck supple  Lungs- Clear to ausculation bilaterally,  normal work of breathing.  No wheezes, rales, rhonchi Heart- Regular rate and rhythm GI- soft, non-tender, non-distended, bowel sounds present  Extremities- no clubbing, cyanosis, or edema  MS- no significant deformity or atrophy Skin- warm and dry, no rash or lesion Psych- euthymic mood, full affect Neuro- strength and sensation are intact   Labs:   Lab Results  Component Value Date   WBC 10.6* 07/25/2014   HGB 13.9 07/25/2014   HCT 42.8 07/25/2014   MCV 94.3 07/25/2014   PLT 169 07/25/2014     Recent Labs Lab 07/25/14 0339  NA 138  K 4.2  CL 106  CO2 24  BUN 11  CREATININE 0.84  CALCIUM 8.6*  GLUCOSE 90     Discharge Medications:    Medication List    TAKE these medications        acetaminophen 650 MG CR tablet  Commonly known as:  TYLENOL  Take 650 mg by mouth every 8 (eight) hours as needed for pain.     aspirin EC 81 MG tablet  Take 81 mg by mouth daily.     atorvastatin 80 MG tablet  Commonly known as:  LIPITOR  Take 1 tablet (80 mg total) by mouth daily.     dofetilide 250 MCG capsule  Commonly known as:  TIKOSYN  Take 1 capsule (250 mcg total) by mouth 2 (two) times daily.     DULoxetine 30 MG capsule  Commonly known as:  CYMBALTA  Take  30 mg by mouth at bedtime.     HYDROcodone-acetaminophen 10-325 MG per tablet  Commonly known as:  NORCO  Take 1 tablet by mouth 2 (two) times daily.     magnesium oxide 400 (241.3 MG) MG tablet  Commonly known as:  MAG-OX  Take 1 tablet (400 mg total) by mouth daily.     metoprolol succinate 50 MG 24 hr tablet  Commonly known as:  TOPROL-XL  Take 50 mg by mouth daily. Take with or immediately following a meal.     multivitamin with minerals Tabs tablet  Take 1 tablet by mouth every evening.     nitroGLYCERIN 0.4 MG SL tablet  Commonly known as:  NITROSTAT  Place 0.4 mg under the tongue every 5 (five) minutes as needed for chest pain (MAX 3 TABLETS).     NON FORMULARY  at bedtime. CPAP      pantoprazole 40 MG tablet  Commonly known as:  PROTONIX  TAKE 1 TABLET (40 MG TOTAL) BY MOUTH DAILY.     rivaroxaban 20 MG Tabs tablet  Commonly known as:  XARELTO  Take 1 tablet (20 mg total) by mouth daily with supper.        Disposition:  Discharge Instructions    Diet - low sodium heart healthy    Complete by:  As directed      Increase activity slowly    Complete by:  As directed           Follow-up Information    Follow up with [place] On 07/31/2014.   Why:  at [facility] for EKG and labs   Contact information:   [place] [address] [phone]      Follow up with [place] [facility] On 08/21/2014.   Why:  at 9:30AM   Contact information:   [place] [address] [phone]      Follow up with Pixie Casino, MD.   Specialty:  Cardiology   Why:  as scheduled   Contact information:   [place] [address] [phone]       Duration of Discharge Encounter: Greater than 30 minutes including physician time.  Signed, Chanetta Marshall, NP 07/25/2014 7:59 AM

## 2014-07-25 NOTE — Clinical Documentation Improvement (Signed)
  Documentation of "NSTEMI" in progress notes but not in discharge summary, please state if condition was ruled in or ruled out for this admission.  Thank you   Medicare rules require specification as to whether an inpatient diagnosis was present at the time of admission.    Please clarify if the following diagnosis NSTEMI  was:     Marland Kitchen Present at the time of admission . NOT present at the time of inpatient admission and it developed during the inpatient stay . Unable to clinically determine whether the condition was present on admission. . Documentation insufficient to determine if condition was present at the time of inpatient admission  Thank You, Ree Kida ,RN Clinical Documentation Specialist:  [facility]

## 2014-07-25 NOTE — Progress Notes (Signed)
UR Completed. Ammaar Encina, RN, BSN.  [phone] 

## 2014-07-28 ENCOUNTER — Other Ambulatory Visit: Payer: Self-pay | Admitting: Pharmacist

## 2014-07-28 MED ORDER — DOFETILIDE 250 MCG PO CAPS: 250.0000 ug | ORAL_CAPSULE | Freq: Two times a day (BID) | ORAL | Status: DC

## 2014-07-31 ENCOUNTER — Encounter (HOSPITAL_COMMUNITY): Payer: Self-pay | Admitting: Nurse Practitioner

## 2014-07-31 ENCOUNTER — Ambulatory Visit (HOSPITAL_COMMUNITY)
Admit: 2014-07-31 | Discharge: 2014-07-31 | Disposition: A | Discharge status: Home / Self Care | Payer: Commercial Managed Care - PPO | Source: Ambulatory Visit | Attending: Nurse Practitioner | Admitting: Nurse Practitioner

## 2014-07-31 ENCOUNTER — Telehealth: Payer: Self-pay | Admitting: Pharmacist

## 2014-07-31 ENCOUNTER — Other Ambulatory Visit (HOSPITAL_COMMUNITY): Payer: Self-pay | Admitting: *Deleted

## 2014-07-31 VITALS — BP 126/74 | HR 49 | Ht 62.0 in | Wt 232.2 lb

## 2014-07-31 DIAGNOSIS — I480 Paroxysmal atrial fibrillation: Secondary | ICD-10-CM | POA: Diagnosis not present

## 2014-07-31 DIAGNOSIS — I481 Persistent atrial fibrillation: Secondary | ICD-10-CM | POA: Diagnosis present

## 2014-07-31 DIAGNOSIS — I2510 Atherosclerotic heart disease of native coronary artery without angina pectoris: Secondary | ICD-10-CM | POA: Insufficient documentation

## 2014-07-31 DIAGNOSIS — I4819 Other persistent atrial fibrillation: Secondary | ICD-10-CM

## 2014-07-31 LAB — BASIC METABOLIC PANEL
Anion gap: 5 (ref 5–15)
BUN: 16 mg/dL (ref 6–20)
CALCIUM: 8.8 mg/dL — AB (ref 8.9–10.3)
CO2: 26 mmol/L (ref 22–32)
CREATININE: 0.74 mg/dL (ref 0.61–1.24)
Chloride: 109 mmol/L (ref 101–111)
GFR calc Af Amer: >60 mL/min (ref >60)
Glucose, Bld: 122 mg/dL — ABNORMAL HIGH (ref 65–99)
Potassium: 3.9 mmol/L (ref 3.5–5.1)
SODIUM: 140 mmol/L (ref 135–145)

## 2014-07-31 LAB — MAGNESIUM: Magnesium: 2 mg/dL (ref 1.7–2.4)

## 2014-07-31 MED ORDER — POTASSIUM CHLORIDE ER 10 MEQ PO TBCR: 10.0000 meq | EXTENDED_RELEASE_TABLET | Freq: Every day | ORAL | Status: DC

## 2014-07-31 NOTE — Progress Notes (Signed)
Patient ID: Paul Todd, male   DOB: 1960-05-22, 54 y.o.   MRN: [id number]     Primary Care Physician: Imelda Pillow, NP Referring Physician: Dr. Foye Clock is a 54 y.o. male with a h/o persistent afib that was recently hospitalized for tikosyn load. Renal function and electrolytes were followed during the hospitalization. Their QTc remained stable. They were monitored until discharge on telemetry which demonstrated sinus rhythm with PVC's. Because of PVC burden, Tikosyn dose was decreased to 25mcg with decrease in PVC burden. On the day of discharge, they were examined by Dr Lovena Le who considered them stable for discharge to home.   Has h/o NSTEMI. During a vagal event, he was noted to have AV block which improved with atropine. His transient AV block was felt to be vagal in nature. Cardiac cath at that time revealed an acutely occluded RCA which was successfully treated with PTCA and diffuse stenting due to RCA stenoses beyond the point of total occlusion. The patient also had high-grade concomitant proximal LAD disease which required staged PCI. Left ventriculography revealed an ejection fraction of ~50%. There was evidence for mild mid and distal inferior hypocontractility as well. The patient developed atrial fibrillation during the procedure which was rate controlled with an IV beta blocker and then a low dose oral agent. He underwent staged PCI to LAD on 02/14/13 with successful PTCA/DES x 1 to proximal LAD. It was felt that the patient would not tolerate atrial fibrillation well and underwent successful TEE/DCCV on 02/15/13.  He was subsequently diagnosed with OSA and started on CPAP. ETOH has also been discontinued.  He more recently was diagnosed with PTE at [facility]. He was found to have recurrent afib also. He reports some fatigue but is mostly   Pt is here for f/u in the afib clinic today. He is not aware of any afib. Is having some difficulty getting his  tikosyn. Will call [employer] [address] to straightened out. He has enough meds thru tomorrow. Ekg shows SR with bradycardia 49 bpm, asymptomatic. Qtc 408 ms.  Today, he denies symptoms of palpitations, chest pain, shortness of breath, orthopnea, PND, lower extremity edema, dizziness, presyncope, syncope, or neurologic sequela. The patient is tolerating medications without difficulties and is otherwise without complaint today.   Past Medical History  Diagnosis Date  . Persistent atrial fibrillation        . Dyslipidemia   . CAD (coronary artery disease)     multivessel per cath 02/12/13, s/p stenting to RCA  . Hypertension   . Nephrolithiasis   . PE (pulmonary thromboembolism) 02/2014  . Myocardial infarction 02/12/2013   . OSA on CPAP   . Arthritis        . Chronic lower back pain    Past Surgical History  Procedure Laterality Date  . Tee without cardioversion N/A 02/15/2013    Procedure: TRANSESOPHAGEAL ECHOCARDIOGRAM (TEE);  Surgeon: Thayer Headings, MD;  Location: [facility];  Service: Cardiovascular;  Laterality: N/A;  . Cardioversion N/A 02/15/2013    Procedure: CARDIOVERSION;  Surgeon: Thayer Headings, MD;  Location: [facility] ENDOSCOPY;  Service: Cardiovascular;  Laterality: N/A;  . Coronary angioplasty with stent placement  02/12/2013  . Cystoscopy with retrograde pyelogram, ureteroscopy and stent placement Left 09/02/2013    Procedure: CYSTOSCOPY WITH RETROGRADE PYELOGRAM/LEFT URETEROSCOPY/URETERAL STENT, with fulguration;  Surgeon: Festus Aloe, MD;  Location: [facility];  Service: Urology;  Laterality: Left;  . Cystoscopy with ureteroscopy and stent placement Left 09/16/2013  Procedure: CYSTOSCOPY WITH URETEROSCOPY AND STENT PLACEMENT;  Surgeon: Festus Aloe, MD;  Location: [facility];  Service: Urology;  Laterality: Left;  . Holmium laser application Left 06/03/4130    Procedure: HOLMIUM LASER APPLICATION;  Surgeon: Festus Aloe, MD;  Location: [facility];  Service: Urology;  Laterality:  Left;  . Left heart catheterization with coronary angiogram N/A 02/12/2013    Procedure: LEFT HEART CATHETERIZATION WITH CORONARY ANGIOGRAM;  Surgeon: Troy Sine, MD;  Location: [facility] CATH LAB;  Service: Cardiovascular;  Laterality: N/A;  . Percutaneous coronary stent intervention (pci-s)  02/12/2013    Procedure: PERCUTANEOUS CORONARY STENT INTERVENTION (PCI-S);  Surgeon: Troy Sine, MD;  Location: [facility] CATH LAB;  Service: Cardiovascular;;  STEMI  . Percutaneous coronary stent intervention (pci-s) N/A 02/14/2013    Procedure: PERCUTANEOUS CORONARY STENT INTERVENTION (PCI-S);  Surgeon: Burnell Blanks, MD;  Location: [facility] CATH LAB;  Service: Cardiovascular;  Laterality: N/A;  Proximal LAD  . Cardioversion N/A 07/07/2014    Procedure: CARDIOVERSION;  Surgeon: Pixie Casino, MD;  Location: [facility] ENDOSCOPY;  Service: Cardiovascular;  Laterality: N/A;    Current Outpatient Prescriptions  Medication Sig Dispense Refill  . acetaminophen (TYLENOL) 650 MG CR tablet Take 650 mg by mouth every 8 (eight) hours as needed for pain.    Marland Kitchen aspirin EC 81 MG tablet Take 81 mg by mouth daily.    Marland Kitchen atorvastatin (LIPITOR) 80 MG tablet Take 1 tablet (80 mg total) by mouth daily. (Patient taking differently: Take 80 mg by mouth daily at 6 PM. ) 90 tablet 3  . dofetilide (TIKOSYN) 250 MCG capsule Take 1 capsule (250 mcg total) by mouth 2 (two) times daily. 180 capsule 3  . DULoxetine (CYMBALTA) 30 MG capsule Take 30 mg by mouth at bedtime.     Marland Kitchen HYDROcodone-acetaminophen (NORCO) 10-325 MG per tablet Take 1 tablet by mouth 2 (two) times daily.     . magnesium oxide (MAG-OX) 400 (241.3 MG) MG tablet Take 1 tablet (400 mg total) by mouth daily. 30 tablet 11  . metoprolol succinate (TOPROL-XL) 50 MG 24 hr tablet Take 50 mg by mouth daily. Take with or immediately following a meal.    . Multiple Vitamin (MULTIVITAMIN WITH MINERALS) TABS tablet Take 1 tablet by mouth every evening.     . nitroGLYCERIN (NITROSTAT) 0.4 MG SL  tablet Place 0.4 mg under the tongue every 5 (five) minutes as needed for chest pain (MAX 3 TABLETS).     . NON FORMULARY at bedtime. CPAP    . pantoprazole (PROTONIX) 40 MG tablet TAKE 1 TABLET (40 MG TOTAL) BY MOUTH DAILY. 30 tablet 7  . rivaroxaban (XARELTO) 20 MG TABS tablet Take 1 tablet (20 mg total) by mouth daily with supper. 30 tablet 5   No current facility-administered medications for this encounter.    No Known Allergies  History   Social History  . Marital Status: Married    Spouse Name: N/A  . Number of Children: N/A  . Years of Education: N/A   Occupational History  . Not on file.   Social History Main Topics  . Smoking status: Former Smoker -- 0.50 packs/day for 35 years    Types: Cigarettes    Quit date: 02/12/2013  . Smokeless tobacco: Never Used  . Alcohol Use: Yes     Comment: 07/21/2014 "never drank much; might have a couple drinks/yr since MI 02/2013"  . Drug Use: No  . Sexual Activity: Yes    Birth Control/ Protection: None  Other Topics Concern  . Not on file   Social History Narrative    Family History  Problem Relation Age of Onset  . Heart disease Mother   . Diabetes Mother   . Diabetes Father     ROS- All systems are reviewed and negative except as per the HPI above  Physical Exam: Filed Vitals:   07/31/14 0857  BP: 126/74  Pulse: 49  Height: 5\' 2"  (1.575 m)  Weight: 232 lb 3.2 oz (105.325 kg)    GEN- The patient is well appearing, alert and oriented x 3 today.   Head- normocephalic, atraumatic Eyes-  Sclera clear, conjunctiva pink Ears- hearing intact Oropharynx- clear Neck- supple, no JVP Lymph- no cervical lymphadenopathy Lungs- Clear to ausculation bilaterally, normal work of breathing Heart- Regular rate and rhythm, no murmurs, rubs or gallops, PMI not laterally displaced GI- soft, NT, ND, + BS Extremities- no clubbing, cyanosis, or edema MS- no significant deformity or atrophy Skin- no rash or lesion Psych- euthymic  mood, full affect Neuro- strength and sensation are intact  EKG- Sbrady at 49 bpm, PR int 202 ms, QRS 96 bpm, QTc 408 ms. Epic records reviewed.   Assessment and Plan:  1. Persistent afib, S/P tikosyn load, maintaining SR. Continue tikosyn 250 mg bid Bmet/mag today Elberta Leatherwood PharmD, says that preauth was needed and completed and tikosyn should now be at drug store for pt pick up.  2. Chadsvasc score of 2 Continue xarelto  3. CAD Stable   F/u in afib clinic in 3 weeks

## 2014-07-31 NOTE — Telephone Encounter (Signed)
PA for Tikosyn completed.  Authorization # [id number]

## 2014-08-01 ENCOUNTER — Other Ambulatory Visit: Payer: Self-pay | Admitting: *Deleted

## 2014-08-02 ENCOUNTER — Telehealth: Payer: Self-pay | Admitting: Pharmacist Clinician (PhC)/ Clinical Pharmacy Specialist

## 2014-08-02 NOTE — Telephone Encounter (Signed)
Spoke with patient about generic Tikosyn.  He is to call after getting generic product and we will arrange for him to have an EKG within 2-3 weeks of making change. Pt voiced understanding.

## 2014-08-07 ENCOUNTER — Ambulatory Visit (HOSPITAL_COMMUNITY)
Admission: RE | Admit: 2014-08-07 | Discharge: 2014-08-07 | Disposition: A | Discharge status: Home / Self Care | Payer: Commercial Managed Care - PPO | Source: Ambulatory Visit | Attending: Nurse Practitioner | Admitting: Nurse Practitioner

## 2014-08-07 DIAGNOSIS — I480 Paroxysmal atrial fibrillation: Secondary | ICD-10-CM

## 2014-08-07 DIAGNOSIS — I4891 Unspecified atrial fibrillation: Secondary | ICD-10-CM | POA: Insufficient documentation

## 2014-08-07 LAB — BASIC METABOLIC PANEL
ANION GAP: 6 (ref 5–15)
BUN: 17 mg/dL (ref 6–20)
CO2: 25 mmol/L (ref 22–32)
Calcium: 9.1 mg/dL (ref 8.9–10.3)
Chloride: 109 mmol/L (ref 101–111)
Creatinine, Ser: 0.82 mg/dL (ref 0.61–1.24)
GFR calc Af Amer: >60 mL/min (ref >60)
GFR calc non Af Amer: >60 mL/min (ref >60)
GLUCOSE: 165 mg/dL — AB (ref 65–99)
POTASSIUM: 4.4 mmol/L (ref 3.5–5.1)
Sodium: 140 mmol/L (ref 135–145)

## 2014-08-10 ENCOUNTER — Other Ambulatory Visit: Payer: Self-pay | Admitting: Internal Medicine

## 2014-08-10 NOTE — Telephone Encounter (Signed)
Rx(s) sent to pharmacy electronically.  

## 2014-08-20 ENCOUNTER — Other Ambulatory Visit (HOSPITAL_COMMUNITY): Payer: Self-pay | Admitting: Nurse Practitioner

## 2014-08-21 ENCOUNTER — Ambulatory Visit (HOSPITAL_COMMUNITY)
Admit: 2014-08-21 | Discharge: 2014-08-21 | Disposition: A | Discharge status: Home / Self Care | Payer: Commercial Managed Care - PPO | Source: Ambulatory Visit | Attending: Nurse Practitioner | Admitting: Nurse Practitioner

## 2014-08-21 ENCOUNTER — Telehealth: Payer: Self-pay

## 2014-08-21 VITALS — BP 136/72 | HR 59 | Ht 67.0 in | Wt 234.6 lb

## 2014-08-21 DIAGNOSIS — I481 Persistent atrial fibrillation: Secondary | ICD-10-CM | POA: Insufficient documentation

## 2014-08-21 DIAGNOSIS — I4819 Other persistent atrial fibrillation: Secondary | ICD-10-CM

## 2014-08-21 DIAGNOSIS — I2510 Atherosclerotic heart disease of native coronary artery without angina pectoris: Secondary | ICD-10-CM | POA: Insufficient documentation

## 2014-08-21 LAB — BASIC METABOLIC PANEL
Anion gap: 7 (ref 5–15)
BUN: 17 mg/dL (ref 6–20)
CHLORIDE: 108 mmol/L (ref 101–111)
CO2: 24 mmol/L (ref 22–32)
Calcium: 8.7 mg/dL — ABNORMAL LOW (ref 8.9–10.3)
Creatinine, Ser: 0.73 mg/dL (ref 0.61–1.24)
GFR calc Af Amer: >60 mL/min (ref >60)
GFR calc non Af Amer: >60 mL/min (ref >60)
Glucose, Bld: 152 mg/dL — ABNORMAL HIGH (ref 65–99)
Potassium: 4 mmol/L (ref 3.5–5.1)
SODIUM: 139 mmol/L (ref 135–145)

## 2014-08-21 LAB — MAGNESIUM: MAGNESIUM: 1.9 mg/dL (ref 1.7–2.4)

## 2014-08-21 NOTE — Patient Instructions (Signed)
Parking code for November 9000

## 2014-08-21 NOTE — Progress Notes (Signed)
Patient ID: Paul Todd, male   DOB: [birth year], 54 y.o.   MRN: [id number]     Primary Care Physician: Imelda Pillow, NP Referring Physician: Dr. Foye Clock is a 54 y.o. male with a h/o persistent afib that was recently hospitalized for tikosyn load. Renal function and electrolytes were followed during the hospitalization.  QTc remained stable. He was monitored until discharge on telemetry which demonstrated sinus rhythm with PVC's. Because of PVC burden, Tikosyn dose was decreased to 237mcg with decrease in PVC burden. On the day of discharge, he was examined by Dr Lovena Le who considered him stable for discharge to home.He was subsequently diagnosed with OSA and started on CPAP. ETOH has also been discontinued.  Pt is here for f/u in the afib clinic today. He is not aware of any afib other than one episode around bedtime last week where it was irregular x 10-15 mins. Ekg shows SR .   Today, he denies symptoms of palpitations, chest pain, shortness of breath, orthopnea, PND, lower extremity edema, dizziness, presyncope, syncope, or neurologic sequela. The patient is tolerating medications without difficulties and is otherwise without complaint today.   Past Medical History  Diagnosis Date  . Persistent atrial fibrillation        . Dyslipidemia   . CAD (coronary artery disease)     multivessel per cath 02/12/13, s/p stenting to RCA  . Hypertension   . Nephrolithiasis   . PE (pulmonary thromboembolism) 02/2014  . Myocardial infarction 02/12/2013   . OSA on CPAP   . Arthritis        . Chronic lower back pain    Past Surgical History  Procedure Laterality Date  . Tee without cardioversion N/A 02/15/2013    Procedure: TRANSESOPHAGEAL ECHOCARDIOGRAM (TEE);  Surgeon: Thayer Headings, MD;  Location: [place];  Service: Cardiovascular;  Laterality: N/A;  . Cardioversion N/A 02/15/2013    Procedure: CARDIOVERSION;  Surgeon: Thayer Headings, MD;  Location: [facility]  ENDOSCOPY;  Service: Cardiovascular;  Laterality: N/A;  . Coronary angioplasty with stent placement  02/12/2013  . Cystoscopy with retrograde pyelogram, ureteroscopy and stent placement Left 09/02/2013    Procedure: CYSTOSCOPY WITH RETROGRADE PYELOGRAM/LEFT URETEROSCOPY/URETERAL STENT, with fulguration;  Surgeon: Festus Aloe, MD;  Location: [facility];  Service: Urology;  Laterality: Left;  . Cystoscopy with ureteroscopy and stent placement Left 09/16/2013    Procedure: CYSTOSCOPY WITH URETEROSCOPY AND STENT PLACEMENT;  Surgeon: Festus Aloe, MD;  Location: [facility];  Service: Urology;  Laterality: Left;  . Holmium laser application Left 2/70/6237    Procedure: HOLMIUM LASER APPLICATION;  Surgeon: Festus Aloe, MD;  Location: [facility];  Service: Urology;  Laterality: Left;  . Left heart catheterization with coronary angiogram N/A 02/12/2013    Procedure: LEFT HEART CATHETERIZATION WITH CORONARY ANGIOGRAM;  Surgeon: Troy Sine, MD;  Location: [facility] CATH LAB;  Service: Cardiovascular;  Laterality: N/A;  . Percutaneous coronary stent intervention (pci-s)  02/12/2013    Procedure: PERCUTANEOUS CORONARY STENT INTERVENTION (PCI-S);  Surgeon: Troy Sine, MD;  Location: [facility] CATH LAB;  Service: Cardiovascular;;  STEMI  . Percutaneous coronary stent intervention (pci-s) N/A 02/14/2013    Procedure: PERCUTANEOUS CORONARY STENT INTERVENTION (PCI-S);  Surgeon: Burnell Blanks, MD;  Location: [facility] CATH LAB;  Service: Cardiovascular;  Laterality: N/A;  Proximal LAD  . Cardioversion N/A 07/07/2014    Procedure: CARDIOVERSION;  Surgeon: Pixie Casino, MD;  Location: [place];  Service: Cardiovascular;  Laterality: N/A;  Current Outpatient Prescriptions  Medication Sig Dispense Refill  . aspirin EC 81 MG tablet Take 81 mg by mouth daily.    Marland Kitchen atorvastatin (LIPITOR) 80 MG tablet Take 1 tablet (80 mg total) by mouth daily. (Patient taking differently: Take 80 mg by mouth daily at 6 PM. ) 90 tablet 3  .  dofetilide (TIKOSYN) 250 MCG capsule Take 1 capsule (250 mcg total) by mouth 2 (two) times daily. 180 capsule 3  . DULoxetine (CYMBALTA) 30 MG capsule Take 30 mg by mouth at bedtime.     Marland Kitchen HYDROcodone-acetaminophen (NORCO) 10-325 MG per tablet Take 1 tablet by mouth 2 (two) times daily.     . magnesium oxide (MAG-OX) 400 (241.3 MG) MG tablet Take 1 tablet (400 mg total) by mouth daily. 30 tablet 11  . metoprolol succinate (TOPROL-XL) 50 MG 24 hr tablet TAKE 1 TABLET (50 MG TOTAL) BY MOUTH ONCE DAILY. 30 tablet 10  . Multiple Vitamin (MULTIVITAMIN WITH MINERALS) TABS tablet Take 1 tablet by mouth every evening.     . NON FORMULARY at bedtime. CPAP    . pantoprazole (PROTONIX) 40 MG tablet TAKE 1 TABLET (40 MG TOTAL) BY MOUTH DAILY. 30 tablet 7  . potassium chloride (K-DUR) 10 MEQ tablet TAKE 1 TABLET(10 MEQ) BY MOUTH DAILY 30 tablet 6  . rivaroxaban (XARELTO) 20 MG TABS tablet Take 1 tablet (20 mg total) by mouth daily with supper. 30 tablet 5  . acetaminophen (TYLENOL) 650 MG CR tablet Take 650 mg by mouth every 8 (eight) hours as needed for pain.    . nitroGLYCERIN (NITROSTAT) 0.4 MG SL tablet Place 0.4 mg under the tongue every 5 (five) minutes as needed for chest pain (MAX 3 TABLETS).      No current facility-administered medications for this encounter.    No Known Allergies  Social History   Social History  . Marital Status: Married    Spouse Name: N/A  . Number of Children: N/A  . Years of Education: N/A   Occupational History  . Not on file.   Social History Main Topics  . Smoking status: Former Smoker -- 0.50 packs/day for 35 years    Types: Cigarettes    Quit date: 02/12/2013  . Smokeless tobacco: Never Used  . Alcohol Use: Yes     Comment: 07/21/2014 "never drank much; might have a couple drinks/yr since MI 02/2013"  . Drug Use: No  . Sexual Activity: Yes    Birth Control/ Protection: None   Other Topics Concern  . Not on file   Social History Narrative    Family  History  Problem Relation Age of Onset  . Heart disease Mother   . Diabetes Mother   . Diabetes Father     ROS- All systems are reviewed and negative except as per the HPI above  Physical Exam: Filed Vitals:   08/21/14 0932  BP: 136/72  Pulse: 59  Height: 5\' 7"  (1.702 m)  Weight: 234 lb 9.6 oz (106.414 kg)    GEN- The patient is well appearing, alert and oriented x 3 today.   Head- normocephalic, atraumatic Eyes-  Sclera clear, conjunctiva pink Ears- hearing intact Oropharynx- clear Neck- supple, no JVP Lymph- no cervical lymphadenopathy Lungs- Clear to ausculation bilaterally, normal work of breathing Heart- Regular rate and rhythm, no murmurs, rubs or gallops, PMI not laterally displaced GI- soft, NT, ND, + BS Extremities- no clubbing, cyanosis, or edema MS- no significant deformity or atrophy Skin- no rash or lesion  Psych- euthymic mood, full affect Neuro- strength and sensation are intact  EKG- Sbrady at 59 bpm, PR int 194 ms, QRS 84 bpm, QTc 403 ms. Epic records reviewed.   Assessment and Plan:  1. Persistent afib, S/P tikosyn load, maintaining SR. Continue tikosyn 250 mg bid, QTc stable. Bmet/mag today  2. Chadsvasc score of 2 Continue xarelto  3. CAD Stable   F/u in afib clinic in 3 months , sooner if needed  Paul Todd, [facility] [address] [place], [place] [address] [phone]

## 2014-08-21 NOTE — Telephone Encounter (Signed)
Approval from [employer] for Tikosyn 238mcg. Good for 1 year-07/31/2015.

## 2014-10-09 ENCOUNTER — Other Ambulatory Visit: Payer: Self-pay | Admitting: *Deleted

## 2014-10-09 MED ORDER — DOFETILIDE 250 MCG PO CAPS: 250.0000 ug | ORAL_CAPSULE | Freq: Two times a day (BID) | ORAL | Status: DC

## 2014-10-10 ENCOUNTER — Other Ambulatory Visit: Payer: Self-pay | Admitting: *Deleted

## 2014-10-14 ENCOUNTER — Other Ambulatory Visit: Payer: Self-pay | Admitting: Internal Medicine

## 2014-10-16 ENCOUNTER — Other Ambulatory Visit: Payer: Self-pay | Admitting: *Deleted

## 2014-10-16 NOTE — Telephone Encounter (Signed)
REFILL 

## 2014-10-16 NOTE — Telephone Encounter (Signed)
°  1. Which medications need to be refilled? Tikosyn  2. Which pharmacy is medication to be sent to? [facility] on [address]   3. Do they need a 30 day or 90 day supply? 90  4. Would they like a call back once the medication has been sent to the pharmacy? Yes

## 2014-10-17 ENCOUNTER — Telehealth: Payer: Self-pay | Admitting: Internal Medicine

## 2014-10-17 NOTE — Telephone Encounter (Signed)
PA number for [employer] [phone] PA request notified from pharmacy lists member ID: [id number]

## 2014-10-17 NOTE — Telephone Encounter (Signed)
Pt's wife called back in stating that the pt will need prior approval from the doctor in order to have his Tikosyn refilled because he has switched insurances. Please f/u with him  Thanks

## 2014-10-17 NOTE — Telephone Encounter (Signed)
Patient needs PA for Tikosyn Member ID: [id number] Group #: [id number] [place]  Patient changed jobs/insurance in October.   Routed to Molinda Bailiff., RN Dr. Rayann Heman started patient on Tikosyn 07/2014

## 2014-10-19 NOTE — Telephone Encounter (Signed)
Have called insurance co to get PA form to work on prior approval for medication.   Paul Todd - are there samples of Tikosyn available?

## 2014-10-19 NOTE — Telephone Encounter (Signed)
Called PA @ [phone] and got Tikosyn approved from 10/19/14 to 10/19/15  Called pharmacy to notify them of approval - it has not shown up in their system yet but has not shown up in their system. The pharmacist will try to run the medication thru in about 30 minutes and will call patient when ready. They are aware patient needs medication today.   Patient's wife notified that med was approved.   PA ID: [id number]

## 2014-10-19 NOTE — Telephone Encounter (Signed)
Pt says pharmacist said they were still waiting for the okay from the insurance company. Pt will be out of his medicine on tomorrow.

## 2014-10-19 NOTE — Telephone Encounter (Signed)
Can this encounter be closed?

## 2014-10-27 ENCOUNTER — Other Ambulatory Visit: Payer: Self-pay

## 2014-10-27 MED ORDER — PANTOPRAZOLE SODIUM 40 MG PO TBEC: DELAYED_RELEASE_TABLET | ORAL | Status: DC

## 2014-11-03 ENCOUNTER — Other Ambulatory Visit: Payer: Self-pay

## 2014-11-03 ENCOUNTER — Telehealth: Payer: Self-pay | Admitting: Internal Medicine

## 2014-11-03 MED ORDER — DOFETILIDE 250 MCG PO CAPS: 250.0000 ug | ORAL_CAPSULE | Freq: Two times a day (BID) | ORAL | Status: DC

## 2014-11-03 NOTE — Telephone Encounter (Signed)
[employer] is requiring a 90 day supply of Tikosyn 289mcg. Prior auth already done in July. New rx sent.

## 2014-11-03 NOTE — Telephone Encounter (Signed)
Paul Todd is needing an authorization for his Dofetilide 250 mcg capsules twice a day . Please call [phone]  Thanks

## 2014-11-20 ENCOUNTER — Inpatient Hospital Stay (HOSPITAL_COMMUNITY): Admission: RE | Admit: 2014-11-20 | Payer: Self-pay | Source: Ambulatory Visit | Admitting: Nurse Practitioner

## 2014-11-28 ENCOUNTER — Ambulatory Visit (HOSPITAL_COMMUNITY)
Admission: RE | Admit: 2014-11-28 | Discharge: 2014-11-28 | Disposition: A | Discharge status: Home / Self Care | Payer: BLUE CROSS/BLUE SHIELD | Source: Ambulatory Visit | Attending: Nurse Practitioner | Admitting: Nurse Practitioner

## 2014-11-28 VITALS — BP 130/82 | HR 64 | Ht 67.0 in | Wt 247.6 lb

## 2014-11-28 DIAGNOSIS — I480 Paroxysmal atrial fibrillation: Secondary | ICD-10-CM | POA: Insufficient documentation

## 2014-11-28 DIAGNOSIS — I481 Persistent atrial fibrillation: Secondary | ICD-10-CM | POA: Diagnosis present

## 2014-11-28 DIAGNOSIS — I4819 Other persistent atrial fibrillation: Secondary | ICD-10-CM

## 2014-11-28 DIAGNOSIS — I2510 Atherosclerotic heart disease of native coronary artery without angina pectoris: Secondary | ICD-10-CM | POA: Diagnosis not present

## 2014-11-28 LAB — BASIC METABOLIC PANEL
Anion gap: 8 (ref 5–15)
BUN: 15 mg/dL (ref 6–20)
CHLORIDE: 107 mmol/L (ref 101–111)
CO2: 24 mmol/L (ref 22–32)
CREATININE: 0.77 mg/dL (ref 0.61–1.24)
Calcium: 9.3 mg/dL (ref 8.9–10.3)
GFR calc Af Amer: >60 mL/min (ref >60)
GFR calc non Af Amer: >60 mL/min (ref >60)
Glucose, Bld: 137 mg/dL — ABNORMAL HIGH (ref 65–99)
Potassium: 4.4 mmol/L (ref 3.5–5.1)
Sodium: 139 mmol/L (ref 135–145)

## 2014-11-28 LAB — TSH: TSH: 0.577 u[IU]/mL (ref 0.350–4.500)

## 2014-11-28 LAB — MAGNESIUM: Magnesium: 2 mg/dL (ref 1.7–2.4)

## 2014-11-28 MED ORDER — NITROGLYCERIN 0.4 MG SL SUBL: 0.4000 mg | SUBLINGUAL_TABLET | SUBLINGUAL | Status: DC | PRN

## 2014-11-28 NOTE — Progress Notes (Signed)
Patient ID: Paul Todd, male   DOB: Jan 27, 1960, 54 y.o.   MRN: [id number]     Primary Care Physician: Imelda Pillow, NP Referring Physician: Dr. Rayann Heman Cardiologist: [name] is a 54 y.o. male with a h/o afib maintaining SR on tikosysn. Has not noticed any breakthrough afib. Being compliant with Tikosyn and DAOC. Is complaining of gaining weight but has chronic back pain, exercise aggravates the pain, and is wanting to have back surgery before the end of the year.   Today, he denies symptoms of palpitations, chest pain, shortness of breath, orthopnea, PND, lower extremity edema, dizziness, presyncope, syncope, or neurologic sequela. Chronic back pain.The patient is tolerating medications without difficulties and is otherwise without complaint today.   Past Medical History  Diagnosis Date  . Persistent atrial fibrillation        . Dyslipidemia   . CAD (coronary artery disease)     multivessel per cath 02/12/13, s/p stenting to RCA  . Hypertension   . Nephrolithiasis   . PE (pulmonary thromboembolism) 02/2014  . Myocardial infarction ([place]) 02/12/2013   . OSA on CPAP   . Arthritis        . Chronic lower back pain    Past Surgical History  Procedure Laterality Date  . Tee without cardioversion N/A 02/15/2013    Procedure: TRANSESOPHAGEAL ECHOCARDIOGRAM (TEE);  Surgeon: Thayer Headings, MD;  Location: [place];  Service: Cardiovascular;  Laterality: N/A;  . Cardioversion N/A 02/15/2013    Procedure: CARDIOVERSION;  Surgeon: Thayer Headings, MD;  Location: [facility] ENDOSCOPY;  Service: Cardiovascular;  Laterality: N/A;  . Coronary angioplasty with stent placement  02/12/2013  . Cystoscopy with retrograde pyelogram, ureteroscopy and stent placement Left 09/02/2013    Procedure: CYSTOSCOPY WITH RETROGRADE PYELOGRAM/LEFT URETEROSCOPY/URETERAL STENT, with fulguration;  Surgeon: Festus Aloe, MD;  Location: [facility];  Service: Urology;  Laterality: Left;  . Cystoscopy  with ureteroscopy and stent placement Left 09/16/2013    Procedure: CYSTOSCOPY WITH URETEROSCOPY AND STENT PLACEMENT;  Surgeon: Festus Aloe, MD;  Location: [facility];  Service: Urology;  Laterality: Left;  . Holmium laser application Left [id number]    Procedure: HOLMIUM LASER APPLICATION;  Surgeon: Festus Aloe, MD;  Location: [facility];  Service: Urology;  Laterality: Left;  . Left heart catheterization with coronary angiogram N/A 02/12/2013    Procedure: LEFT HEART CATHETERIZATION WITH CORONARY ANGIOGRAM;  Surgeon: Troy Sine, MD;  [facility] C[facility] LAB;  Service: Cardiovascular;  Laterality: N/A;  . Percutaneous coronary stent intervention (pci-s)  02/12/2013    Procedure: PERCUTANEOUS CORONARY STENT INTERVENTION (PCI-S);  Surgeon: Troy Sine, MD;  Location: [facility] CATH LAB;  Service: Cardiovascular;;  STEMI  . Percutaneous coronary stent intervention (pci-s) N/A 02/14/2013    Procedure: PERCUTANEOUS CORONARY STENT INTERVENTION (PCI-S);  Surgeon: Burnell Blanks, MD;  Location: [facility] CATH LAB;  Service: Cardiovascular;  Laterality: N/A;  Proximal LAD  . Cardioversion N/A 07/07/2014    Procedure: CARDIOVERSION;  Surgeon: Pixie Casino, MD;  Location: [facility] ENDOSCOPY;  Service: Cardiovascular;  Laterality: N/A;    Current Outpatient Prescriptions  Medication Sig Dispense Refill  . acetaminophen (TYLENOL) 650 MG CR tablet Take 650 mg by mouth every 8 (eight) hours as needed for pain.    Marland Kitchen aspirin EC 81 MG tablet Take 81 mg by mouth daily.    Marland Kitchen atorvastatin (LIPITOR) 80 MG tablet Take 1 tablet (80 mg total) by mouth daily. (Patient taking differently: Take 80 mg by mouth  daily at 6 PM. ) 90 tablet 3  . dofetilide (TIKOSYN) 250 MCG capsule Take 1 capsule (250 mcg total) by mouth 2 (two) times daily. 180 capsule 0  . DULoxetine (CYMBALTA) 30 MG capsule Take 30 mg by mouth at bedtime.     Marland Kitchen HYDROcodone-acetaminophen (NORCO) 10-325 MG per tablet Take 1 tablet by mouth 2 (two) times daily.     .  magnesium oxide (MAG-OX) 400 (241.3 MG) MG tablet Take 1 tablet (400 mg total) by mouth daily. 30 tablet 11  . metoprolol succinate (TOPROL-XL) 50 MG 24 hr tablet TAKE 1 TABLET (50 MG TOTAL) BY MOUTH ONCE DAILY. 30 tablet 10  . Multiple Vitamin (MULTIVITAMIN WITH MINERALS) TABS tablet Take 1 tablet by mouth every evening.     . nitroGLYCERIN (NITROSTAT) 0.4 MG SL tablet Place 1 tablet (0.4 mg total) under the tongue every 5 (five) minutes as needed for chest pain (MAX 3 TABLETS). 30 tablet 12  . NON FORMULARY at bedtime. CPAP    . pantoprazole (PROTONIX) 40 MG tablet TAKE 1 TABLET (40 MG TOTAL) BY MOUTH DAILY. 30 tablet 7  . potassium chloride (K-DUR) 10 MEQ tablet TAKE 1 TABLET(10 MEQ) BY MOUTH DAILY 30 tablet 6  . rivaroxaban (XARELTO) 20 MG TABS tablet Take 1 tablet (20 mg total) by mouth daily with supper. 30 tablet 5   No current facility-administered medications for this encounter.    No Known Allergies  Social History   Social History  . Marital Status: Married    Spouse Name: N/A  . Number of Children: N/A  . Years of Education: N/A   Occupational History  . Not on file.   Social History Main Topics  . Smoking status: Former Smoker -- 0.50 packs/day for 35 years    Types: Cigarettes    Quit date: 02/12/2013  . Smokeless tobacco: Never Used  . Alcohol Use: Yes     Comment: 07/21/2014 "never drank much; might have a couple drinks/yr since MI 02/2013"  . Drug Use: No  . Sexual Activity: Yes    Birth Control/ Protection: None   Other Topics Concern  . Not on file   Social History Narrative    Family History  Problem Relation Age of Onset  . Heart disease Mother   . Diabetes Mother   . Diabetes Father     ROS- All systems are reviewed and negative except as per the HPI above  Physical Exam: Filed Vitals:   11/28/14 0903  BP: 130/82  Pulse: 64  Height: 5\' 7"  (1.702 m)  Weight: 247 lb 9.6 oz (112.311 kg)    GEN- The patient is well appearing, alert and  oriented x 3 today.   Head- normocephalic, atraumatic Eyes-  Sclera clear, conjunctiva pink Ears- hearing intact Oropharynx- clear Neck- supple, no JVP Lymph- no cervical lymphadenopathy Lungs- Clear to ausculation bilaterally, normal work of breathing Heart- Regular rate and rhythm, no murmurs, rubs or gallops, PMI not laterally displaced GI- soft, NT, ND, + BS Extremities- no clubbing, cyanosis, or edema MS- no significant deformity or atrophy Skin- no rash or lesion Psych- euthymic mood, full affect Neuro- strength and sensation are intact  EKG- NSR 64 bpm, print 192 ms, qrs int 84 ms, qtc 400 ms. Epic records reviewed  Assessment and Plan: 1. Afib In SR on [employer], mag today plus TSH for weight gain, but most likely due to inactivity from back issues Continue xarelto  2. CAD Stable Continue asa, statin, BB, NTG  refilled at pt's request, he is not using, it has expired  F/u in 3 months in afib clinic  [place]ullen. Carroll, [facility] [address] [place], [place] [address] [phone]

## 2014-12-19 ENCOUNTER — Other Ambulatory Visit: Payer: Self-pay | Admitting: Internal Medicine

## 2014-12-19 NOTE — Telephone Encounter (Signed)
REFILL 

## 2014-12-26 ENCOUNTER — Other Ambulatory Visit: Payer: Self-pay

## 2014-12-26 MED ORDER — METOPROLOL SUCCINATE ER 50 MG PO TB24: 50.0000 mg | ORAL_TABLET | Freq: Every day | ORAL | Status: DC

## 2014-12-26 NOTE — Telephone Encounter (Signed)
Rx(s) sent to pharmacy electronically.  

## 2015-01-07 HISTORY — PX: BACK SURGERY: SHX140

## 2015-02-01 ENCOUNTER — Other Ambulatory Visit: Payer: Self-pay | Admitting: Internal Medicine

## 2015-02-15 ENCOUNTER — Other Ambulatory Visit: Payer: Self-pay | Admitting: Neurosurgery

## 2015-02-15 DIAGNOSIS — M4310 Spondylolisthesis, site unspecified: Secondary | ICD-10-CM

## 2015-02-18 ENCOUNTER — Ambulatory Visit
Admission: RE | Admit: 2015-02-18 | Discharge: 2015-02-18 | Disposition: A | Discharge status: Home / Self Care | Payer: BLUE CROSS/BLUE SHIELD | Source: Ambulatory Visit | Attending: Neurosurgery | Admitting: Neurosurgery

## 2015-02-18 DIAGNOSIS — M4310 Spondylolisthesis, site unspecified: Secondary | ICD-10-CM

## 2015-02-27 ENCOUNTER — Inpatient Hospital Stay (HOSPITAL_COMMUNITY): Admission: RE | Admit: 2015-02-27 | Payer: Self-pay | Source: Ambulatory Visit | Admitting: Nurse Practitioner

## 2015-03-03 ENCOUNTER — Other Ambulatory Visit: Payer: Self-pay | Admitting: Internal Medicine

## 2015-03-05 NOTE — Telephone Encounter (Signed)
Rx(s) sent to pharmacy electronically.  

## 2015-03-06 ENCOUNTER — Ambulatory Visit (HOSPITAL_COMMUNITY)
Admission: RE | Admit: 2015-03-06 | Discharge: 2015-03-06 | Disposition: A | Discharge status: Home / Self Care | Payer: BLUE CROSS/BLUE SHIELD | Source: Ambulatory Visit | Attending: Nurse Practitioner | Admitting: Nurse Practitioner

## 2015-03-06 ENCOUNTER — Encounter (HOSPITAL_COMMUNITY): Payer: Self-pay | Admitting: Nurse Practitioner

## 2015-03-06 VITALS — BP 136/82 | HR 68 | Ht 67.0 in | Wt 252.8 lb

## 2015-03-06 DIAGNOSIS — I10 Essential (primary) hypertension: Secondary | ICD-10-CM | POA: Insufficient documentation

## 2015-03-06 DIAGNOSIS — Z79899 Other long term (current) drug therapy: Secondary | ICD-10-CM | POA: Insufficient documentation

## 2015-03-06 DIAGNOSIS — I480 Paroxysmal atrial fibrillation: Secondary | ICD-10-CM | POA: Diagnosis not present

## 2015-03-06 DIAGNOSIS — Z7902 Long term (current) use of antithrombotics/antiplatelets: Secondary | ICD-10-CM | POA: Insufficient documentation

## 2015-03-06 DIAGNOSIS — Z8249 Family history of ischemic heart disease and other diseases of the circulatory system: Secondary | ICD-10-CM | POA: Insufficient documentation

## 2015-03-06 DIAGNOSIS — I252 Old myocardial infarction: Secondary | ICD-10-CM | POA: Insufficient documentation

## 2015-03-06 DIAGNOSIS — E785 Hyperlipidemia, unspecified: Secondary | ICD-10-CM | POA: Insufficient documentation

## 2015-03-06 DIAGNOSIS — Z955 Presence of coronary angioplasty implant and graft: Secondary | ICD-10-CM | POA: Diagnosis not present

## 2015-03-06 DIAGNOSIS — G4733 Obstructive sleep apnea (adult) (pediatric): Secondary | ICD-10-CM | POA: Insufficient documentation

## 2015-03-06 DIAGNOSIS — Z7982 Long term (current) use of aspirin: Secondary | ICD-10-CM | POA: Diagnosis not present

## 2015-03-06 DIAGNOSIS — I2510 Atherosclerotic heart disease of native coronary artery without angina pectoris: Secondary | ICD-10-CM | POA: Insufficient documentation

## 2015-03-06 DIAGNOSIS — Z87891 Personal history of nicotine dependence: Secondary | ICD-10-CM | POA: Diagnosis not present

## 2015-03-06 DIAGNOSIS — Z833 Family history of diabetes mellitus: Secondary | ICD-10-CM | POA: Diagnosis not present

## 2015-03-06 DIAGNOSIS — Z86711 Personal history of pulmonary embolism: Secondary | ICD-10-CM | POA: Diagnosis not present

## 2015-03-06 DIAGNOSIS — I4891 Unspecified atrial fibrillation: Secondary | ICD-10-CM | POA: Insufficient documentation

## 2015-03-06 LAB — BASIC METABOLIC PANEL
Anion gap: 7 (ref 5–15)
BUN: 18 mg/dL (ref 6–20)
CO2: 29 mmol/L (ref 22–32)
Calcium: 9.4 mg/dL (ref 8.9–10.3)
Chloride: 105 mmol/L (ref 101–111)
Creatinine, Ser: 0.82 mg/dL (ref 0.61–1.24)
GFR calc non Af Amer: >60 mL/min (ref >60)
Glucose, Bld: 118 mg/dL — ABNORMAL HIGH (ref 65–99)
Potassium: 4.4 mmol/L (ref 3.5–5.1)
SODIUM: 141 mmol/L (ref 135–145)

## 2015-03-06 LAB — MAGNESIUM: MAGNESIUM: 1.9 mg/dL (ref 1.7–2.4)

## 2015-03-06 NOTE — Progress Notes (Signed)
Patient ID: Paul Todd, male   DOB: 02/15/1960, 55 y.o.   MRN: [id number]     Primary Care Physician: Imelda Pillow, NP Referring Physician: Dr. Foye Clock is a 55 y.o. male with a h/o afib that is here for f/u in the afib clinic. He continues on [place] and is staying in [place]. He denies bleeding issues. Compliant with xarelto. He is anticipating back surgery in the new future.  Today, he denies symptoms of palpitations, chest pain, shortness of breath, orthopnea, PND, lower extremity edema, dizziness, presyncope, syncope, or neurologic sequela. The patient is tolerating medications without difficulties and is otherwise without complaint today.   Past Medical History  Diagnosis Date  . Persistent atrial fibrillation ([place])        . Dyslipidemia   . CAD (coronary artery disease)     multivessel per cath 02/12/13, s/p stenting to RCA  . Hypertension   . Nephrolithiasis   . PE (pulmonary thromboembolism) ([place]) 02/2014  . Myocardial infarction ([place]) 02/12/2013   . OSA on CPAP   . Arthritis        . Chronic lower back pain    Past Surgical History  Procedure Laterality Date  . Tee without cardioversion N/A 02/15/2013    Procedure: TRANSESOPHAGEAL ECHOCARDIOGRAM (TEE);  Surgeon: Thayer Headings, MD;  Location: [place];  Service: Cardiovascular;  Laterality: N/A;  . Cardioversion N/A 02/15/2013    Procedure: CARDIOVERSION;  Surgeon: Thayer Headings, MD;  Location: [facility] ENDOSCOPY;  Service: Cardiovascular;  Laterality: N/A;  . Coronary angioplasty with stent placement  02/12/2013  . Cystoscopy with retrograde pyelogram, ureteroscopy and stent placement Left 09/02/2013    Procedure: CYSTOSCOPY WITH RETROGRADE PYELOGRAM/LEFT URETEROSCOPY/URETERAL STENT, with fulguration;  Surgeon: Festus Aloe, MD;  [facility];  Service: Urology;  Laterality: Left;  . Cystoscopy with ureteroscopy and stent placement Left 09/16/2013    Procedure: CYSTOSCOPY WITH URETEROSCOPY AND  STENT PLACEMENT;  Surgeon: Festus Aloe, MD;  Location: [facility];  Service: Urology;  Laterality: Left;  . Holmium laser application Left [id number]    Procedure: HOLMIUM LASER APPLICATION;  Surgeon: Festus Aloe, MD;  Location: [facility];  Service: Urology;  Laterality: Left;  . Left heart catheterization with coronary angiogram N/A 02/12/2013    Procedure: LEFT HEART CATHETERIZATION WITH CORONARY ANGIOGRAM;  Surgeon: Troy Sine, MD;  Location: [facility] CATH LAB;  Service: Cardiovascular;  Laterality: N/A;  . Percutaneous coronary stent intervention (pci-s)  02/12/2013    Procedure: PERCUTANEOUS CORONARY STENT INTERVENTION (PCI-S);  Surgeon: Troy Sine, MD;  Location: [facility] CATH LAB;  Service: Cardiovascular;;  STEMI  . Percutaneous coronary stent intervention (pci-s) N/A 02/14/2013    Procedure: PERCUTANEOUS CORONARY STENT INTERVENTION (PCI-S);  Surgeon: Burnell Blanks, MD;  Location: [facility] CATH LAB;  Service: Cardiovascular;  Laterality: N/A;  Proximal LAD  . Cardioversion N/A 07/07/2014    Procedure: CARDIOVERSION;  Surgeon: Pixie Casino, MD;  Location: [facility] ENDOSCOPY;  Service: Cardiovascular;  Laterality: N/A;    Current Outpatient Prescriptions  Medication Sig Dispense Refill  . acetaminophen (TYLENOL) 650 MG CR tablet Take 650 mg by mouth every 8 (eight) hours as needed for pain.    Marland Kitchen aspirin EC 81 MG tablet Take 81 mg by mouth daily.    Marland Kitchen atorvastatin (LIPITOR) 80 MG tablet Take 1 tablet (80 mg total) by mouth daily. PLEASE CONTACT OFFICE FOR ADDITIONAL REFILLS 90 tablet 0  . dofetilide (TIKOSYN) 250 MCG capsule Take 1 capsule (250 mcg  total) by mouth 2 (two) times daily. 180 capsule 0  . DULoxetine (CYMBALTA) 30 MG capsule Take 30 mg by mouth at bedtime.     Marland Kitchen HYDROcodone-acetaminophen (NORCO) 10-325 MG per tablet Take 1 tablet by mouth 2 (two) times daily.     . magnesium oxide (MAG-OX) 400 (241.3 MG) MG tablet Take 1 tablet (400 mg total) by mouth daily. 30 tablet 11  . metoprolol  succinate (TOPROL-XL) 50 MG 24 hr tablet Take 1 tablet (50 mg total) by mouth daily. Take with or immediately following a meal. 90 tablet 1  . Multiple Vitamin (MULTIVITAMIN WITH MINERALS) TABS tablet Take 1 tablet by mouth every evening.     . nitroGLYCERIN (NITROSTAT) 0.4 MG SL tablet Place 1 tablet (0.4 mg total) under the tongue every 5 (five) minutes as needed for chest pain (MAX 3 TABLETS). 30 tablet 12  . NON FORMULARY at bedtime. CPAP    . pantoprazole (PROTONIX) 40 MG tablet Take 1 tablet (40 mg total) by mouth daily. KEEP OV. 30 tablet 2  . potassium chloride (K-DUR) 10 MEQ tablet TAKE 1 TABLET(10 MEQ) BY MOUTH DAILY 30 tablet 6  . XARELTO 20 MG TABS tablet TAKE 1 TABLET (20 MG TOTAL) BY MOUTH DAILY WITH SUPPER. 30 tablet 5   No current facility-administered medications for this encounter.    No Known Allergies  Social History   Social History  . Marital Status: Married    Spouse Name: N/A  . Number of Children: N/A  . Years of Education: N/A   Occupational History  . Not on file.   Social History Main Topics  . Smoking status: Former Smoker -- 0.50 packs/day for 35 years    Types: Cigarettes    Quit date: 02/12/2013  . Smokeless tobacco: Never Used  . Alcohol Use: Yes     Comment: 07/21/2014 "never drank much; might have a couple drinks/yr since MI 02/2013"  . Drug Use: No  . Sexual Activity: Yes    Birth Control/ Protection: None   Other Topics Concern  . Not on file   Social History Narrative    Family History  Problem Relation Age of Onset  . Heart disease Mother   . Diabetes Mother   . Diabetes Father     ROS- All systems are reviewed and negative except as per the HPI above  Physical Exam: Filed Vitals:   03/06/15 0922  BP: 136/82  Pulse: 68  Height: 5\' 7"  (1.702 m)  Weight: 252 lb 12.8 oz (114.669 kg)    GEN- The patient is well appearing, alert and oriented x 3 today.   Head- normocephalic, atraumatic Eyes-  Sclera clear, conjunctiva  pink Ears- hearing intact Oropharynx- clear Neck- supple, no JVP Lymph- no cervical lymphadenopathy Lungs- Clear to ausculation bilaterally, normal work of breathing Heart- Regular rate and rhythm, no murmurs, rubs or gallops, PMI not laterally displaced GI- soft, NT, ND, + BS Extremities- no clubbing, cyanosis, or edema MS- no significant deformity or atrophy Skin- no rash or lesion Psych- euthymic mood, full affect Neuro- strength and sensation are intact  EKG- NSR, normal EKG, pr int 182 md, qrs int 88 ms, qtc 410 ms Epic records reviewed  Assessment and Plan: 1. afib Staying in SR with tikosyn 250 mg bid Continue xarelto bmet/mag  2. HTN Stable  3. CAD Stable Continue baby asa Continue atorastatin   F/u in 3 months  Butch Penny C. Generoso Cropper, [facility] [address]  [place], [place] [address] [phone]   Stable

## 2015-03-09 ENCOUNTER — Ambulatory Visit (HOSPITAL_COMMUNITY)
Admission: RE | Admit: 2015-03-09 | Discharge: 2015-03-09 | Disposition: A | Discharge status: Home / Self Care | Payer: BLUE CROSS/BLUE SHIELD | Source: Ambulatory Visit | Attending: Internal Medicine | Admitting: Internal Medicine

## 2015-03-09 ENCOUNTER — Encounter: Payer: Self-pay | Admitting: Internal Medicine

## 2015-03-09 ENCOUNTER — Ambulatory Visit (INDEPENDENT_AMBULATORY_CARE_PROVIDER_SITE_OTHER): Payer: BLUE CROSS/BLUE SHIELD | Admitting: Internal Medicine

## 2015-03-09 VITALS — BP 134/82 | HR 62 | Ht 67.0 in | Wt 251.8 lb

## 2015-03-09 DIAGNOSIS — I340 Nonrheumatic mitral (valve) insufficiency: Secondary | ICD-10-CM | POA: Diagnosis not present

## 2015-03-09 DIAGNOSIS — I255 Ischemic cardiomyopathy: Secondary | ICD-10-CM | POA: Diagnosis not present

## 2015-03-09 DIAGNOSIS — E669 Obesity, unspecified: Secondary | ICD-10-CM | POA: Insufficient documentation

## 2015-03-09 DIAGNOSIS — Z01810 Encounter for preprocedural cardiovascular examination: Secondary | ICD-10-CM | POA: Insufficient documentation

## 2015-03-09 DIAGNOSIS — E785 Hyperlipidemia, unspecified: Secondary | ICD-10-CM | POA: Diagnosis not present

## 2015-03-09 DIAGNOSIS — M5489 Other dorsalgia: Secondary | ICD-10-CM

## 2015-03-09 DIAGNOSIS — I071 Rheumatic tricuspid insufficiency: Secondary | ICD-10-CM | POA: Diagnosis not present

## 2015-03-09 DIAGNOSIS — Z6839 Body mass index (BMI) 39.0-39.9, adult: Secondary | ICD-10-CM | POA: Insufficient documentation

## 2015-03-09 DIAGNOSIS — Z9861 Coronary angioplasty status: Secondary | ICD-10-CM

## 2015-03-09 DIAGNOSIS — Z87891 Personal history of nicotine dependence: Secondary | ICD-10-CM | POA: Insufficient documentation

## 2015-03-09 DIAGNOSIS — I480 Paroxysmal atrial fibrillation: Secondary | ICD-10-CM | POA: Diagnosis not present

## 2015-03-09 DIAGNOSIS — I119 Hypertensive heart disease without heart failure: Secondary | ICD-10-CM | POA: Diagnosis not present

## 2015-03-09 DIAGNOSIS — I2510 Atherosclerotic heart disease of native coronary artery without angina pectoris: Secondary | ICD-10-CM

## 2015-03-09 NOTE — Patient Instructions (Signed)
Your physician has requested that you have an echocardiogram TODAY - PV4 with [place]. Echocardiography is a painless test that uses sound waves to create images of your heart. It provides your doctor with information about the size and shape of your heart and how well your heart's chambers and valves are working. This procedure takes approximately one hour. There are no restrictions for this procedure.  Your physician recommends that you return for lab work FASTING to check cholesterol   Your physician wants you to follow-up in: 6 months with [name]. You will receive a reminder letter in the mail two months in advance. If you don't receive a letter, please call our office to schedule the follow-up appointment.

## 2015-03-11 ENCOUNTER — Other Ambulatory Visit: Payer: Self-pay | Admitting: Internal Medicine

## 2015-03-11 NOTE — Progress Notes (Signed)
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 He apparently travels significantly and has a pretty poor diet. He reports drinking about 6 alcoholic beverages per week and smokes about one half pack per day for the past 30 years. Recently, while up in [place], he was suffering from a kidney stone and was noted to have atrial fibrillation. He was unaware of this, and was given medication to slow his heart rate. Apparently he converted back into sinus and was instructed to be on aspirin.  He has recently been describing some chest pain which is substernal and radiates to the jaw and down into his right arm. The symptoms last for about 5-10 minutes and go away at rest, if he has been exerting himself. He also has a history of heart disease both in his mother and a father who had high blood pressure.  In addition he filled out an S4 sleepiness scale today which was scored as 12 suggesting a higher than likely possibility of sleep apnea.  He does report difficulty staying asleep, and feeling drowsy throughout the day with the need to take frequent naps.   Paul Todd returns today for followup of his nuclear stress test which was performed on 02/02/2013. This showed essentially no reversible ischemia, however the EF was reduced at 48%. It is unclear to me whether this is a gating abnormality or perhaps due to long-standing hypertension, undiagnosed sleep apnea, or multivessel coronary disease. He did have the episode of paroxysmal atrial fibrillation and continues to have chest pain which is very concerning for angina. He also had a recent lipid profile demonstrating an LDL of 111 and LDL particle number of 1774.  Triglycerides were elevated at 206.  I recommended starting Lipitor 40 mg daily.  Unfortunately, while undergoing work-up,  he presented on 02/12/13 with a NSTEMI. He is followed by Dr Debara Pickett and had recently been seen in the office for chest pain. He underwent a Myoview on 02/02/13 which returned abnormal with an EF of 48%, but there was no ischemia. He was then scheduled for an elective cardiac cath for the next week. However, on 02/12/13 he woke up with SSCP "tightness" associated with diaphoresis, SOB, and radiation to his jaw and right arm and proceeded to the ED. While in the ED the patient developed nausea and was noted to be bradycardic with HR in the 30s and SBP dropped to the 50s. ECG showed complete heart block but with a narrow escape. He was given atropine 0.5 mg with rise in HR to the 70s, NSR. Patient's second troponin returned abnormal at 0.17 and continued to climb >20. His episode of complete heart block was suspected to be vagally mediated from nausea; however, it was thought that RCA ischemia needed to be ruled out completely. It was decided that he proceed with a cardiac cath.   Cardiac cath on 02/12/13 revealed an acutely occluded RCA which was successfully treated with PTCA and diffuse stenting due to RCA stenoses beyond the point of total occlusion. The patient also had high-grade concomitant proximal LAD disease which required staged PCI. Left ventriculography revealed an ejection fraction of ~50%. There was evidence for mild mid and distal inferior hypocontractility as well. The patient developed atrial fibrillation during the procedure which was rate controlled with an IV beta blocker and then a low dose oral  agent. He underwent staged PCI to LAD on 02/14/13 with successful PTCA/DES x 1 to proximal LAD and he was placed on ASA/Brilinta.  It was felt that the patient would not tolerate atrial fibrillation well and underwent successful TEE/DCCV on 02/15/13. He is on triple therapy with coumadin. The patient had some issues with hematuria (likely from foley cath related injury) prompting more serious discission about his  anticoagulation status. Dr. Burt Knack reviewed his options considering his recent MI and multivessel stenting as well as indication for anticoagulation with atrial fib and felt it best that he continue ASA 81 mg, switch brilinta to plavix and continue heparin/warfarin overlap, which would be limited to 3-6 months depending on his clinical course. Of note patient also had some shortness of breath on Brilinta, which was resolved when he was switched to Plavix.  Today he reports doing fairly well other than low back pain. He did have a positive sleep study and still reports difficulty sleeping at night. Although he has not been set with a CPAP machine. We need to try to arrange that. The difficulty may have been affected he lost his insurance. He reports to me that he did however secure a job and will be starting in the next few weeks. He's actually did be working 10 hour days and is concerned about his ability to stay on a seat for that long. He's having problems with his back as mentioned and has seen Dr. Maryjean Ka who is contemplating injections. I had initially cleared him for this however realize that he just had recent stenting and that we would be unable to stop his antiplatelet therapy for at least one year, which is up in February 2016.  I saw Paul Todd back today. He is doing much better. He is now using CPAP and reports some improvement in his sleep. He continues to have problems with back pain which is his main concern. Is now been over a year since he underwent drug-eluting stent placement and I feel comfortable adjusting his antiplatelet medicines. We had previously kept him on Plavix and Zarrella toe due to the high bleeding risk of being on triple therapy. He had a small amount of PAF which was post procedure and has a low CHADSVASC score of 0-1 (possible hypertension). Given his young age and the fact that his atrial fibrillation was mostly procedurally related and related to a kidney stone attack,  and the fact that he is now on CPAP which likely increased his risk of atrial fibrillation, I suspect his burden of age of fibrillation is very low. He is in sinus rhythm today. I would like to discontinue Xarelto and switch him over to aspirin and Plavix. He could of course stop this as needed for back injections which he will would likely pursue within the next couple of weeks.  Paul Todd returns today. Unfortunately, he only been off of Xarelto for a few weeks to allow back injections, but then developed acute chest pain and was found to have acute pulmonary embolus at [facility] by VQ scan. Placed back on Xarelto 15 mg twice a day and is about to complete the 21 day course at that dose at which time he'll switch to the 20 mg daily dose. He reports she's been fatigue but that his chest pains gotten better. His EKG does show that he is back in atrial fibrillation today. At his last office visit he was in sinus rhythm. It may be that is in A. fib and that's  why he feels bad.  Paul Todd returns today for follow-up. His ongoing complaint is continued low back pain. Unfortunately because of recurrent A. fib and pulmonary embolus he has not been able to come off of anticoagulation for having this addressed. At this point he is likely able to go ahead with any workup that he needs regarding his back. He is recently been started on take a 7 and had an uneventful loading in the hospital. He's been maintaining sinus rhythm. He is tolerating Xarelto without bleeding problems. He has had some shortness of breath recently and based on his last TEE prior to cardioversion in 2015 EF was 40-45%. This was in the setting of atrial fibrillation and has not been reassessed. I ordered an echocardiogram which was completed today at the hospital and those results indicated that his EF has improved to 55-60%. This would put him at low risk for surgery.  PMHx:  Past Medical History  Diagnosis Date  . Persistent  atrial fibrillation ([place])        . Dyslipidemia   . CAD (coronary artery disease)     multivessel per cath 02/12/13, s/p stenting to RCA  . Hypertension   . Nephrolithiasis   . PE (pulmonary thromboembolism) ([place]) 02/2014  . Myocardial infarction ([place]) 02/12/2013   . OSA on CPAP   . Arthritis        . Chronic lower back pain     Past Surgical History  Procedure Laterality Date  . Tee without cardioversion N/A 02/15/2013    Procedure: TRANSESOPHAGEAL ECHOCARDIOGRAM (TEE);  Surgeon: Thayer Headings, MD;  Location: [place];  Service: Cardiovascular;  Laterality: N/A;  . Cardioversion N/A 02/15/2013    Procedure: CARDIOVERSION;  Surgeon: Thayer Headings, MD;  Location: [facility] ENDOSCOPY;  Service: Cardiovascular;  Laterality: N/A;  . Coronary angioplasty with stent placement  02/12/2013  . Cystoscopy with retrograde pyelogram, ureteroscopy and stent placement Left 09/02/2013    Procedure: CYSTOSCOPY WITH RETROGRADE PYELOGRAM/LEFT URETEROSCOPY/URETERAL STENT, with fulguration;  Surgeon: Festus Aloe, MD;  Location: [facility];  Service: Urology;  Laterality: Left;  . Cystoscopy with ureteroscopy and stent placement Left 09/16/2013    Procedure: CYSTOSCOPY WITH URETEROSCOPY AND STENT PLACEMENT;  Surgeon: Festus Aloe, MD;  Location: [facility];  Service: Urology;  Laterality: Left;  . Holmium laser application Left [id number]    Procedure: HOLMIUM LASER APPLICATION;  Surgeon: Festus Aloe, MD;  [facility];  Service: Urology;  Laterality: Left;  . Left heart catheterization with coronary angiogram N/A 02/12/2013    Procedure: LEFT HEART CATHETERIZATION WITH CORONARY ANGIOGRAM;  Surgeon: Troy Sine, MD;  Location: [facility] CATH LAB;  Service: Cardiovascular;  Laterality: N/A;  . Percutaneous coronary stent intervention (pci-s)  02/12/2013    Procedure: PERCUTANEOUS CORONARY STENT INTERVENTION (PCI-S);  Surgeon: Troy Sine, MD;  Location[facility] LAB;  Service: Cardiovascular;;  STEMI  .  Percutaneous coronary stent intervention (pci-s) N/A 02/14/2013    Procedure: PERCUTANEOUS CORONARY STENT INTERVENTION (PCI-S);  Surgeon: Burnell Blanks, MD;  Location: [facility] CATH LAB;  Service: Cardiovascular;  Laterality: N/A;  Proximal LAD  . Cardioversion N/A 07/07/2014    Procedure: CARDIOVERSION;  Surgeon: Pixie Casino, MD;  Location: [facility] ENDOSCOPY;  Service: Cardiovascular;  Laterality: N/A;    FAMHx:  Family History  Problem Relation Age of Onset  . Heart disease Mother   . Diabetes Mother   . Diabetes Father     SOCHx:   reports that he quit smoking about 2  years ago. His smoking use included Cigarettes. He has a 17.5 pack-year smoking history. He has never used smokeless tobacco. He reports that he drinks alcohol. He reports that he does not use illicit drugs.  ALLERGIES:  No Known Allergies  ROS: A comprehensive review of systems was negative except for: Constitutional: positive for fatigue and difficulty sleeping, snoring Musculoskeletal: positive for back pain  HOME MEDS: Current Outpatient Prescriptions  Medication Sig Dispense Refill  . acetaminophen (TYLENOL) 650 MG CR tablet Take 650 mg by mouth every 8 (eight) hours as needed for pain.    Marland Kitchen aspirin EC 81 MG tablet Take 81 mg by mouth daily.    Marland Kitchen atorvastatin (LIPITOR) 80 MG tablet Take 1 tablet (80 mg total) by mouth daily. PLEASE CONTACT OFFICE FOR ADDITIONAL REFILLS 90 tablet 0  . Docusate Calcium (STOOL SOFTENER PO) Take 2 capsules by mouth at bedtime.    . dofetilide (TIKOSYN) 250 MCG capsule Take 1 capsule (250 mcg total) by mouth 2 (two) times daily. 180 capsule 0  . DULoxetine (CYMBALTA) 30 MG capsule Take 30 mg by mouth at bedtime.     . magnesium oxide (MAG-OX) 400 (241.3 MG) MG tablet Take 1 tablet (400 mg total) by mouth daily. 30 tablet 11  . metoprolol succinate (TOPROL-XL) 50 MG 24 hr tablet Take 1 tablet (50 mg total) by mouth daily. Take with or immediately following a meal. 90 tablet 1  .  Multiple Vitamin (MULTIVITAMIN WITH MINERALS) TABS tablet Take 1 tablet by mouth every evening.     . nitroGLYCERIN (NITROSTAT) 0.4 MG SL tablet Place 1 tablet (0.4 mg total) under the tongue every 5 (five) minutes as needed for chest pain (MAX 3 TABLETS). 30 tablet 12  . NON FORMULARY at bedtime. CPAP    . oxyCODONE-acetaminophen (PERCOCET) 7.5-325 MG tablet Take 1 tablet by mouth every 4 (four) hours as needed for severe pain.    . pantoprazole (PROTONIX) 40 MG tablet Take 1 tablet (40 mg total) by mouth daily. KEEP OV. 30 tablet 2  . potassium chloride (K-DUR) 10 MEQ tablet TAKE 1 TABLET(10 MEQ) BY MOUTH DAILY 30 tablet 6  . XARELTO 20 MG TABS tablet TAKE 1 TABLET (20 MG TOTAL) BY MOUTH DAILY WITH SUPPER. 30 tablet 5   No current facility-administered medications for this visit.    LABS/IMAGING: No results found for this or any previous visit (from the past 48 hour(s)). No results found.  VITALS: BP 134/82 mmHg  Pulse 62  Ht 5\' 7"  (1.702 m)  Wt 251 lb 12.8 oz (114.216 kg)  BMI 39.43 kg/m2  EXAM: General appearance: alert and no distress Neck: no carotid bruit, no JVD and thyroid not enlarged, symmetric, no tenderness/mass/nodules Lungs: clear to auscultation bilaterally Heart: regular rate and rhythm, S1, S2 normal, no murmur, click, rub or gallop Abdomen: soft, non-tender; bowel sounds normal; no masses,  no organomegaly Extremities: extremities normal, atraumatic, no cyanosis or edema Pulses: 2+ and symmetric Skin: Skin color, texture, turgor normal. No rashes or lesions Neurologic: Grossly normal Psych: Normal  EKG: Deferred  ASSESSMENT:  1. Tobacco abuse 2. CAD s/p PCi to the entire RCA (50+ mm) with DES in 02/2013 and mid-LAD 02/2013 3. Paroxysmal atrial fibrillation - maintaining sinus on Tikosyn 4. OSA on CPAP 5. Low back pain - low risk for surgery 6. Pulmonary embolus of unknown etiology (02/2014) on Xarelto  PLAN: 1.   Paul Todd continues to have  significant back pain which is limiting his ability to  work or stand even for short periods of time. He is seeing Dr. Hal Neer who is contemplating back surgery. Paul Todd is now more than one year since his pulmonary embolus and seems to be stabilized on Tikosyn with regards to maintaining sinus rhythm for his A. fib. His CHADSVASC score is 1, however given his concomitant pulmonary embolism, he will need to remain on lifelong Xarelto. He will also likely need bridging prior to back surgery. I will coordinate this with Erasmo Downer, our anticoagulation pharmacist. Follow-up with me in 6 months.  Pixie Casino, MD, [facility] Attending Cardiologist [place] 03/11/2015, 1:01 PM

## 2015-03-12 NOTE — Telephone Encounter (Signed)
Rx(s) sent to pharmacy electronically.  

## 2015-03-17 ENCOUNTER — Other Ambulatory Visit (HOSPITAL_COMMUNITY): Payer: Self-pay | Admitting: Nurse Practitioner

## 2015-04-08 ENCOUNTER — Other Ambulatory Visit: Payer: Self-pay | Admitting: Internal Medicine

## 2015-06-04 ENCOUNTER — Other Ambulatory Visit: Payer: Self-pay | Admitting: Internal Medicine

## 2015-06-05 NOTE — Telephone Encounter (Signed)
Rx(s) sent to pharmacy electronically.  

## 2015-06-06 ENCOUNTER — Telehealth: Payer: Self-pay | Admitting: Internal Medicine

## 2015-06-06 NOTE — Telephone Encounter (Signed)
New Message  RN case manager from [place] calling to speak w/ rN. Please call back and discuss.

## 2015-06-07 NOTE — Telephone Encounter (Signed)
Left msg for RN to return call. 

## 2015-06-08 NOTE — Telephone Encounter (Signed)
Per voicemail, miriam will be out of the office until 06-14-15.

## 2015-06-26 ENCOUNTER — Other Ambulatory Visit: Payer: Self-pay | Admitting: Internal Medicine

## 2015-06-26 ENCOUNTER — Other Ambulatory Visit: Payer: Self-pay | Admitting: *Deleted

## 2015-06-26 MED ORDER — METOPROLOL SUCCINATE ER 50 MG PO TB24
50.0000 mg | ORAL_TABLET | Freq: Every day | ORAL | Status: DC
Start: 2015-06-26 — End: 2016-06-24

## 2015-06-27 ENCOUNTER — Other Ambulatory Visit: Payer: Self-pay | Admitting: Internal Medicine

## 2015-07-11 ENCOUNTER — Ambulatory Visit (HOSPITAL_COMMUNITY)
Admission: RE | Admit: 2015-07-11 | Discharge: 2015-07-11 | Disposition: A | Discharge status: Home / Self Care | Payer: BLUE CROSS/BLUE SHIELD | Source: Ambulatory Visit | Attending: Nurse Practitioner | Admitting: Nurse Practitioner

## 2015-07-11 ENCOUNTER — Encounter (HOSPITAL_COMMUNITY): Payer: Self-pay | Admitting: Nurse Practitioner

## 2015-07-11 VITALS — BP 138/76 | HR 68 | Ht 67.0 in | Wt 254.8 lb

## 2015-07-11 DIAGNOSIS — I252 Old myocardial infarction: Secondary | ICD-10-CM | POA: Diagnosis not present

## 2015-07-11 DIAGNOSIS — I10 Essential (primary) hypertension: Secondary | ICD-10-CM | POA: Insufficient documentation

## 2015-07-11 DIAGNOSIS — M1990 Unspecified osteoarthritis, unspecified site: Secondary | ICD-10-CM | POA: Diagnosis not present

## 2015-07-11 DIAGNOSIS — Z7982 Long term (current) use of aspirin: Secondary | ICD-10-CM | POA: Diagnosis not present

## 2015-07-11 DIAGNOSIS — I481 Persistent atrial fibrillation: Secondary | ICD-10-CM | POA: Diagnosis not present

## 2015-07-11 DIAGNOSIS — E785 Hyperlipidemia, unspecified: Secondary | ICD-10-CM | POA: Insufficient documentation

## 2015-07-11 DIAGNOSIS — Z87891 Personal history of nicotine dependence: Secondary | ICD-10-CM | POA: Diagnosis not present

## 2015-07-11 DIAGNOSIS — G4733 Obstructive sleep apnea (adult) (pediatric): Secondary | ICD-10-CM | POA: Insufficient documentation

## 2015-07-11 DIAGNOSIS — I480 Paroxysmal atrial fibrillation: Secondary | ICD-10-CM | POA: Diagnosis not present

## 2015-07-11 DIAGNOSIS — I498 Other specified cardiac arrhythmias: Secondary | ICD-10-CM | POA: Diagnosis not present

## 2015-07-11 DIAGNOSIS — I2510 Atherosclerotic heart disease of native coronary artery without angina pectoris: Secondary | ICD-10-CM | POA: Insufficient documentation

## 2015-07-11 DIAGNOSIS — Z79899 Other long term (current) drug therapy: Secondary | ICD-10-CM | POA: Diagnosis not present

## 2015-07-11 DIAGNOSIS — Z86711 Personal history of pulmonary embolism: Secondary | ICD-10-CM | POA: Diagnosis not present

## 2015-07-11 DIAGNOSIS — Z7901 Long term (current) use of anticoagulants: Secondary | ICD-10-CM | POA: Insufficient documentation

## 2015-07-11 DIAGNOSIS — I4891 Unspecified atrial fibrillation: Secondary | ICD-10-CM | POA: Diagnosis present

## 2015-07-11 LAB — BASIC METABOLIC PANEL
Anion gap: 7 (ref 5–15)
BUN: 17 mg/dL (ref 6–20)
CHLORIDE: 108 mmol/L (ref 101–111)
CO2: 23 mmol/L (ref 22–32)
Calcium: 8.8 mg/dL — ABNORMAL LOW (ref 8.9–10.3)
Creatinine, Ser: 0.78 mg/dL (ref 0.61–1.24)
GFR calc Af Amer: >60 mL/min (ref >60)
GFR calc non Af Amer: >60 mL/min (ref >60)
Glucose, Bld: 136 mg/dL — ABNORMAL HIGH (ref 65–99)
POTASSIUM: 3.9 mmol/L (ref 3.5–5.1)
SODIUM: 138 mmol/L (ref 135–145)

## 2015-07-11 LAB — MAGNESIUM: MAGNESIUM: 1.9 mg/dL (ref 1.7–2.4)

## 2015-07-11 NOTE — Progress Notes (Signed)
Patient ID: Paul Todd, male   DOB: March 30, 1960, 55 y.o.   MRN: [id number]     Primary Care Physician: Paul Pillow, NP Referring Physician: Dr. Foye Clock is a 55 y.o. male with a h/o afib that is here for f/u in the afib clinic. He continues on [place] and is staying in [place]. He denies bleeding issues. Compliant with xarelto. He is anticipating back surgery in the new future.  He does describe couple of times a week that he will break out in a sweat, usually when he is just sitting around in the evening watching TV. He thinks this correlates with a HR around 100, BP is usually OK, but he has not really paid much attention and the episodes are not sustained.. He has noticed HR's in the upper 40's at times, but is not symptomatic with these readings. He does f/u with [name] in the next month and he will try to keep better records of the spells and BP/HR at the time for review. He never feels irregular rhythm. He may need a monitor to help sort out symptoms if they continue. He does remain on Tiksoyn.  Today, he denies symptoms of palpitations, chest pain, shortness of breath, orthopnea, PND, lower extremity edema, dizziness, presyncope, syncope, or neurologic sequela. The patient is tolerating medications without difficulties and is otherwise without complaint today.   Past Medical History  Diagnosis Date  . Persistent atrial fibrillation ([place])        . Dyslipidemia   . CAD (coronary artery disease)     multivessel per cath 02/12/13, s/p stenting to RCA  . Hypertension   . Nephrolithiasis   . PE (pulmonary thromboembolism) ([place]) 02/2014  . Myocardial infarction ([place]) 02/12/2013   . OSA on CPAP   . Arthritis        . Chronic lower back pain    Past Surgical History  Procedure Laterality Date  . Tee without cardioversion N/A 02/15/2013    Procedure: TRANSESOPHAGEAL ECHOCARDIOGRAM (TEE);  Surgeon: Thayer Headings, MD;  Location: [place];  Service:  Cardiovascular;  Laterality: N/A;  . Cardioversion N/A 02/15/2013    Procedure: CARDIOVERSION;  Surgeon: Thayer Headings, MD;  Location: [facility] ENDOSCOPY;  Service: Cardiovascular;  Laterality: N/A;  . Coronary angioplasty with stent placement  02/12/2013  . Cystoscopy with retrograde pyelogram, ureteroscopy and stent placement Left 09/02/2013    Procedure: CYSTOSCOPY WITH RETROGRADE PYELOGRAM/LEFT URETEROSCOPY/URETERAL STENT, with fulguration;  Surgeon: Festus Aloe, MD;  [facility];  Service: Urology;  Laterality: Left;  . Cystoscopy with ureteroscopy and stent placement Left 09/16/2013    Procedure: CYSTOSCOPY WITH URETEROSCOPY AND STENT PLACEMENT;  Surgeon: Festus Aloe, MD;  [facility];  Service: Urology;  Laterality: Left;  . Holmium laser application Left [id number]    Procedure: HOLMIUM LASER APPLICATION;  Surgeon: Festus Aloe, MD;  [facility];  Service: Urology;  Laterality: Left;  . Left heart catheterization with coronary angiogram N/A 02/12/2013    Procedure: LEFT HEART CATHETERIZATION WITH CORONARY ANGIOGRAM;  Surgeon: Troy Sine, MD;  Location: [facility] CATH LAB;  Service: Cardiovascular;  Laterality: N/A;  . Percutaneous coronary stent intervention (pci-s)  02/12/2013    Procedure: PERCUTANEOUS CORONARY STENT INTERVENTION (PCI-S);  Surgeon: Troy Sine, MD;  Location: [facility] CATH LAB;  Service: Cardiovascular;;  STEMI  . Percutaneous coronary stent intervention (pci-s) N/A 02/14/2013    Procedure: PERCUTANEOUS CORONARY STENT INTERVENTION (PCI-S);  Surgeon: Burnell Blanks, MD;  Location:  [place] CATH LAB;  Service: Cardiovascular;  Laterality: N/A;  Proximal LAD  . Cardioversion N/A 07/07/2014    Procedure: CARDIOVERSION;  Surgeon: Pixie Casino, MD;  Location: [facility] ENDOSCOPY;  Service: Cardiovascular;  Laterality: N/A;    Current Outpatient Prescriptions  Medication Sig Dispense Refill  . acetaminophen (TYLENOL) 650 MG CR tablet Take 650 mg by mouth every 8 (eight)  hours as needed for pain.    Marland Kitchen aspirin EC 81 MG tablet Take 81 mg by mouth daily.    Marland Kitchen atorvastatin (LIPITOR) 80 MG tablet Take 1 tablet (80 mg total) by mouth daily at 6 PM. 90 tablet 3  . Docusate Calcium (STOOL SOFTENER PO) Take 2 capsules by mouth at bedtime.    . dofetilide (TIKOSYN) 250 MCG capsule Take 1 capsule (250 mcg total) by mouth 2 (two) times daily. 180 capsule 0  . DULoxetine (CYMBALTA) 30 MG capsule Take 30 mg by mouth at bedtime.     . magnesium oxide (MAG-OX) 400 (241.3 MG) MG tablet Take 1 tablet (400 mg total) by mouth daily. 30 tablet 11  . metoprolol succinate (TOPROL-XL) 50 MG 24 hr tablet Take 1 tablet (50 mg total) by mouth daily. Take with or immediately following a meal. 90 tablet 3  . Multiple Vitamin (MULTIVITAMIN WITH MINERALS) TABS tablet Take 1 tablet by mouth every evening.     . nitroGLYCERIN (NITROSTAT) 0.4 MG SL tablet Place 1 tablet (0.4 mg total) under the tongue every 5 (five) minutes as needed for chest pain (MAX 3 TABLETS). 30 tablet 12  . NON FORMULARY at bedtime. CPAP    . oxyCODONE-acetaminophen (PERCOCET) 7.5-325 MG tablet Take 1 tablet by mouth every 4 (four) hours as needed for severe pain.    . pantoprazole (PROTONIX) 40 MG tablet Take 1 tablet (40 mg total) by mouth daily. 30 tablet 11  . potassium chloride (K-DUR) 10 MEQ tablet TAKE 1 TABLET(10 MEQ) BY MOUTH DAILY 30 tablet 6  . XARELTO 20 MG TABS tablet TAKE 1 TABLET (20 MG TOTAL) BY MOUTH DAILY WITH SUPPER. 30 tablet 5   No current facility-administered medications for this encounter.    No Known Allergies  Social History   Social History  . Marital Status: Married    Spouse Name: N/A  . Number of Children: N/A  . Years of Education: N/A   Occupational History  . Not on file.   Social History Main Topics  . Smoking status: Former Smoker -- 0.50 packs/day for 35 years    Types: Cigarettes    Quit date: 02/12/2013  . Smokeless tobacco: Never Used  . Alcohol Use: Yes     Comment:  07/21/2014 "never drank much; might have a couple drinks/yr since MI 02/2013"  . Drug Use: No  . Sexual Activity: Yes    Birth Control/ Protection: None   Other Topics Concern  . Not on file   Social History Narrative    Family History  Problem Relation Age of Onset  . Heart disease Mother   . Diabetes Mother   . Diabetes Father     ROS- All systems are reviewed and negative except as per the HPI above  Physical Exam: Filed Vitals:   07/11/15 0941  BP: 138/76  Pulse: 68  Height: 5\' 7"  (1.702 m)  Weight: 254 lb 12.8 oz (115.577 kg)    GEN- The patient is well appearing, alert and oriented x 3 today.   Head- normocephalic, atraumatic Eyes-  Sclera clear, conjunctiva pink Ears-  hearing intact Oropharynx- clear Neck- supple, no JVP Lymph- no cervical lymphadenopathy Lungs- Clear to ausculation bilaterally, normal work of breathing Heart- Regular rate and rhythm, no murmurs, rubs or gallops, PMI not laterally displaced GI- soft, NT, ND, + BS Extremities- no clubbing, cyanosis, or edema MS- no significant deformity or atrophy Skin- no rash or lesion Psych- euthymic mood, full affect Neuro- strength and sensation are intact  EKG- NSR, normal EKG,68 bpm,  pr int 196 ms, qrs int 82 ms, qtc 408 ms Epic records reviewed  Assessment and Plan: 1. afib Staying in SR with tikosyn 250 mg bid Has spells of sweating , sometimes notices HR is around 100, will keep better record of these spells and discuss with [name]. If PAF is suspected, holter monitor may be helpful to confirm and determine af burden. Continue xarelto bmet/mag  2. HTN Stable  3. CAD Stable Continue baby asa Continue atorastatin   F/u in 6 months for Tikosyn surveillance  Butch Penny C. [facility] [address] [place], [place] [address] [phone]   Stable

## 2015-07-16 NOTE — Progress Notes (Signed)
Spoke to pt, he will callback to make appt

## 2015-07-19 ENCOUNTER — Other Ambulatory Visit: Payer: Self-pay

## 2015-07-19 MED ORDER — RIVAROXABAN 20 MG PO TABS: ORAL_TABLET | ORAL | Status: DC

## 2015-07-20 ENCOUNTER — Telehealth: Payer: Self-pay

## 2015-07-20 NOTE — Telephone Encounter (Signed)
Prior auth for Dofetilide faxed to [place].

## 2015-07-27 ENCOUNTER — Encounter (HOSPITAL_COMMUNITY): Payer: Self-pay | Admitting: *Deleted

## 2015-08-04 ENCOUNTER — Other Ambulatory Visit: Payer: Self-pay | Admitting: Internal Medicine

## 2015-08-06 NOTE — Telephone Encounter (Signed)
Rx request sent to pharmacy.  

## 2015-08-22 ENCOUNTER — Telehealth: Payer: Self-pay | Admitting: Internal Medicine

## 2015-08-22 NOTE — Telephone Encounter (Signed)
[place] [facility] requests clearance for:   1. Type of surgery: bilateral L4/5 laminectomy, partial L3 laminectomy, partial L5/S1 laminectomy 2. Date of surgery: planned for 09/12/2015 3. Surgeon: Dr. Lavell Anchors  4. Medications that need to be held & how long: aspirin 81mg  & xarelto  5. Fax and/or Phone: (p) [phone]  ([phone] -- attention: Anderson Malta

## 2015-08-23 ENCOUNTER — Ambulatory Visit (INDEPENDENT_AMBULATORY_CARE_PROVIDER_SITE_OTHER): Payer: BLUE CROSS/BLUE SHIELD | Admitting: Internal Medicine

## 2015-08-23 ENCOUNTER — Encounter: Payer: Self-pay | Admitting: Internal Medicine

## 2015-08-23 VITALS — BP 107/59 | HR 56 | Ht 61.0 in | Wt 255.6 lb

## 2015-08-23 DIAGNOSIS — G4733 Obstructive sleep apnea (adult) (pediatric): Secondary | ICD-10-CM

## 2015-08-23 DIAGNOSIS — I480 Paroxysmal atrial fibrillation: Secondary | ICD-10-CM

## 2015-08-23 DIAGNOSIS — M5489 Other dorsalgia: Secondary | ICD-10-CM | POA: Diagnosis not present

## 2015-08-23 DIAGNOSIS — Z01810 Encounter for preprocedural cardiovascular examination: Secondary | ICD-10-CM | POA: Diagnosis not present

## 2015-08-23 NOTE — Telephone Encounter (Signed)
Would hold aspirin 7 days prior to surgery and Xarelto 2 days prior - sound reasonable Erasmo Downer? He had PE on 02/2014 and prior stents > 1 year ago. Also a-fib, but low CHADSVASC score of 1.  Dr. Lemmie Evens

## 2015-08-23 NOTE — Telephone Encounter (Signed)
Clearance routed via EPIC Patient aware of when to hold medication(s)

## 2015-08-23 NOTE — Telephone Encounter (Signed)
As per our discussion today. That is correct. Hold Xarelto 2 days.

## 2015-08-23 NOTE — Patient Instructions (Signed)
HOLD aspirin 7 days prior to surgery  HOLD xarelto 2 days prior to surgery   Your physician wants you to follow-up in: 6 months with [name]. You will receive a reminder letter in the mail two months in advance. If you don't receive a letter, please call our office to schedule the follow-up appointment.

## 2015-08-23 NOTE — Progress Notes (Signed)
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 He apparently travels significantly and has a pretty poor diet. He reports drinking about 6 alcoholic beverages per week and smokes about one half pack per day for the past 30 years. Recently, while up in [place], he was suffering from a kidney stone and was noted to have atrial fibrillation. He was unaware of this, and was given medication to slow his heart rate. Apparently he converted back into sinus and was instructed to be on aspirin.  He has recently been describing some chest pain which is substernal and radiates to the jaw and down into his right arm. The symptoms last for about 5-10 minutes and go away at rest, if he has been exerting himself. He also has a history of heart disease both in his mother and a father who had high blood pressure.  In addition he filled out an S4 sleepiness scale today which was scored as 12 suggesting a higher than likely possibility of sleep apnea.  He does report difficulty staying asleep, and feeling drowsy throughout the day with the need to take frequent naps.   Paul Todd returns today for followup of his nuclear stress test which was performed on 02/02/2013. This showed essentially no reversible ischemia, however the EF was reduced at 48%. It is unclear to me whether this is a gating abnormality or perhaps due to long-standing hypertension, undiagnosed sleep apnea, or multivessel coronary disease. He did have the episode of paroxysmal atrial fibrillation and continues to have chest pain which is very concerning for angina. He also had a recent lipid profile demonstrating an LDL of 111 and LDL particle number of 1774.  Triglycerides were elevated at 206.  I recommended starting Lipitor 40 mg daily.  Unfortunately, while undergoing  work-up, he presented on 02/12/13 with a NSTEMI. He is followed by Dr Debara Pickett and had recently been seen in the office for chest pain. He underwent a Myoview on 02/02/13 which returned abnormal with an EF of 48%, but there was no ischemia. He was then scheduled for an elective cardiac cath for the next week. However, on 02/12/13 he woke up with SSCP "tightness" associated with diaphoresis, SOB, and radiation to his jaw and right arm and proceeded to the ED. While in the ED the patient developed nausea and was noted to be bradycardic with HR in the 30s and SBP dropped to the 50s. ECG showed complete heart block but with a narrow escape. He was given atropine 0.5 mg with rise in HR to the 70s, NSR. Patient's second troponin returned abnormal at 0.17 and continued to climb >20. His episode of complete heart block was suspected to be vagally mediated from nausea; however, it was thought that RCA ischemia needed to be ruled out completely. It was decided that he proceed with a cardiac cath.   Cardiac cath on 02/12/13 revealed an acutely occluded RCA which was successfully treated with PTCA and diffuse stenting due to RCA stenoses beyond the point of total occlusion. The patient also had high-grade concomitant proximal LAD disease which required staged PCI. Left ventriculography revealed an ejection fraction of ~50%. There was evidence for mild mid and distal inferior hypocontractility as well. The patient developed atrial fibrillation during the procedure which was rate controlled with an IV beta blocker and then a low dose oral  agent. He underwent staged PCI to LAD on 02/14/13 with successful PTCA/DES x 1 to proximal LAD and he was placed on ASA/Brilinta.  It was felt that the patient would not tolerate atrial fibrillation well and underwent successful TEE/DCCV on 02/15/13. He is on triple therapy with coumadin. The patient had some issues with hematuria (likely from foley cath related injury) prompting more serious discission  about his anticoagulation status. Dr. Burt Knack reviewed his options considering his recent MI and multivessel stenting as well as indication for anticoagulation with atrial fib and felt it best that he continue ASA 81 mg, switch brilinta to plavix and continue heparin/warfarin overlap, which would be limited to 3-6 months depending on his clinical course. Of note patient also had some shortness of breath on Brilinta, which was resolved when he was switched to Plavix.  Today he reports doing fairly well other than low back pain. He did have a positive sleep study and still reports difficulty sleeping at night. Although he has not been set with a CPAP machine. We need to try to arrange that. The difficulty may have been affected he lost his insurance. He reports to me that he did however secure a job and will be starting in the next few weeks. He's actually did be working 10 hour days and is concerned about his ability to stay on a seat for that long. He's having problems with his back as mentioned and has seen Dr. Maryjean Ka who is contemplating injections. I had initially cleared him for this however realize that he just had recent stenting and that we would be unable to stop his antiplatelet therapy for at least one year, which is up in February 2016.  I saw Paul Todd back today. He is doing much better. He is now using CPAP and reports some improvement in his sleep. He continues to have problems with back pain which is his main concern. Is now been over a year since he underwent drug-eluting stent placement and I feel comfortable adjusting his antiplatelet medicines. We had previously kept him on Plavix and Zarrella toe due to the high bleeding risk of being on triple therapy. He had a small amount of PAF which was post procedure and has a low CHADSVASC score of 0-1 (possible hypertension). Given his young age and the fact that his atrial fibrillation was mostly procedurally related and related to a kidney  stone attack, and the fact that he is now on CPAP which likely increased his risk of atrial fibrillation, I suspect his burden of age of fibrillation is very low. He is in sinus rhythm today. I would like to discontinue Xarelto and switch him over to aspirin and Plavix. He could of course stop this as needed for back injections which he will would likely pursue within the next couple of weeks.  Paul Todd returns today. Unfortunately, he only been off of Xarelto for a few weeks to allow back injections, but then developed acute chest pain and was found to have acute pulmonary embolus at [facility] by VQ scan. Placed back on Xarelto 15 mg twice a day and is about to complete the 21 day course at that dose at which time he'll switch to the 20 mg daily dose. He reports she's been fatigue but that his chest pains gotten better. His EKG does show that he is back in atrial fibrillation today. At his last office visit he was in sinus rhythm. It may be that is in A. fib and that's  why he feels bad.  Paul Todd returns today for follow-up. His ongoing complaint is continued low back pain. Unfortunately because of recurrent A. fib and pulmonary embolus he has not been able to come off of anticoagulation for having this addressed. At this point he is likely able to go ahead with any workup that he needs regarding his back. He is recently been started on take a 7 and had an uneventful loading in the hospital. He's been maintaining sinus rhythm. He is tolerating Xarelto without bleeding problems. He has had some shortness of breath recently and based on his last TEE prior to cardioversion in 2015 EF was 40-45%. This was in the setting of atrial fibrillation and has not been reassessed. I ordered an echocardiogram which was completed today at the hospital and those results indicated that his EF has improved to 55-60%. This would put him at low risk for surgery.  08/23/2015  Paul Todd returns today for  follow-up. He continues to be struggling with back pain is but is planning to have surgery on September 6. He's here today for preoperative risk assessment. After what he's been through the past year at this point I feel that he is at acceptable risk for surgery. He has completed more than 1 year of anticoagulation for his pulmonary embolus and could be taken off of Xarelto for short period of time. He's also had adequate time for stent healing and aspirin could be discontinued for surgery is well. He reports good control of his A. fib although has some very short episodes of breakthrough lasting more than 10-15 seconds but perhaps he's had only 2 or 3 of those events since starting on the T consent. He's also concerned about continued weight gain. Weight now is 255 with a BMI 48. He thinks this could be related to medicines however it may be more likely related to inactivity and he also is reporting fatigue and sluggishness, symptoms prior to when he started on CPAP, but I suspect this is also related to narcotics and/or his Cymbalta.  PMHx:  Past Medical History:  Diagnosis Date  . Arthritis       . CAD (coronary artery disease)    multivessel per cath 02/12/13, s/p stenting to RCA  . Chronic lower back pain   . Dyslipidemia   . Hypertension   . Myocardial infarction ([place]) 02/12/2013   . Nephrolithiasis   . OSA on CPAP   . PE (pulmonary thromboembolism) ([place]) 02/2014  . Persistent atrial fibrillation [facility])         Past Surgical History:  Procedure Laterality Date  . CARDIOVERSION N/A 02/15/2013   Procedure: CARDIOVERSION;  Surgeon: Thayer Headings, MD;  Location: [place];  Service: Cardiovascular;  Laterality: N/A;  . CARDIOVERSION N/A 07/07/2014   Procedure: CARDIOVERSION;  Surgeon: Pixie Casino, MD;  Location: [place];  Service: Cardiovascular;  Laterality: N/A;  . CORONARY ANGIOPLASTY WITH STENT PLACEMENT  02/12/2013  . CYSTOSCOPY WITH RETROGRADE PYELOGRAM, URETEROSCOPY AND STENT  PLACEMENT Left 09/02/2013   Procedure: CYSTOSCOPY WITH RETROGRADE PYELOGRAM/LEFT URETEROSCOPY/URETERAL STENT, with fulguration;  Surgeon: Festus Aloe, MD;  Location: [facility];  Service: Urology;  Laterality: Left;  . CYSTOSCOPY WITH URETEROSCOPY AND STENT PLACEMENT Left 09/16/2013   Procedure: CYSTOSCOPY WITH URETEROSCOPY AND STENT PLACEMENT;  Surgeon: Festus Aloe, MD;  Location: [facility];  Service: Urology;  Laterality: Left;  . HOLMIUM LASER APPLICATION Left [id number]   Procedure: HOLMIUM LASER APPLICATION;  Surgeon: Festus Aloe, MD;  Location: [facility];  Service: Urology;  Laterality: Left;  . LEFT HEART CATHETERIZATION WITH CORONARY ANGIOGRAM N/A 02/12/2013   Procedure: LEFT HEART CATHETERIZATION WITH CORONARY ANGIOGRAM;  Surgeon: Troy Sine, MD;  Location: [facility] CATH LAB;  Service: Cardiovascular;  Laterality: N/A;  . PERCUTANEOUS CORONARY STENT INTERVENTION (PCI-S)  02/12/2013   Procedure: PERCUTANEOUS CORONARY STENT INTERVENTION (PCI-S);  Surgeon: Troy Sine, MD;  Location: [facility] CATH LAB;  Service: Cardiovascular;;  STEMI  . PERCUTANEOUS CORONARY STENT INTERVENTION (PCI-S) N/A 02/14/2013   Procedure: PERCUTANEOUS CORONARY STENT INTERVENTION (PCI-S);  Surgeon: Burnell Blanks, MD;  Location: [facility] CATH LAB;  Service: Cardiovascular;  Laterality: N/A;  Proximal LAD  . TEE WITHOUT CARDIOVERSION N/A 02/15/2013   Procedure: TRANSESOPHAGEAL ECHOCARDIOGRAM (TEE);  Surgeon: Thayer Headings, MD;  Location: [facility] ENDOSCOPY;  Service: Cardiovascular;  Laterality: N/A;    FAMHx:  Family History  Problem Relation Age of Onset  . Heart disease Mother   . Diabetes Mother   . Diabetes Father     SOCHx:   reports that he quit smoking about 2 years ago. His smoking use included Cigarettes. He has a 17.50 pack-year smoking history. He has never used smokeless tobacco. He reports that he drinks alcohol. He reports that he does not use drugs.  ALLERGIES:  No Known Allergies  ROS: Pertinent items  noted in HPI and remainder of comprehensive ROS otherwise negative.  HOME MEDS: Current Outpatient Prescriptions  Medication Sig Dispense Refill  . acetaminophen (TYLENOL) 650 MG CR tablet Take 650 mg by mouth every 8 (eight) hours as needed for pain.    Marland Kitchen aspirin EC 81 MG tablet Take 81 mg by mouth daily.    Marland Kitchen atorvastatin (LIPITOR) 80 MG tablet Take 1 tablet (80 mg total) by mouth daily at 6 PM. 90 tablet 3  . cephALEXin (KEFLEX) 500 MG capsule     . Docusate Calcium (STOOL SOFTENER PO) Take 2 capsules by mouth at bedtime.    . dofetilide (TIKOSYN) 250 MCG capsule Take 1 capsule (250 mcg total) by mouth 2 (two) times daily. 60 capsule 6  . DULoxetine (CYMBALTA) 30 MG capsule Take 30 mg by mouth at bedtime.     . magnesium oxide (MAG-OX) 400 (241.3 MG) MG tablet Take 1 tablet (400 mg total) by mouth daily. 30 tablet 11  . metoprolol succinate (TOPROL-XL) 50 MG 24 hr tablet Take 1 tablet (50 mg total) by mouth daily. Take with or immediately following a meal. 90 tablet 3  . Multiple Vitamin (MULTIVITAMIN WITH MINERALS) TABS tablet Take 1 tablet by mouth every evening.     . nitroGLYCERIN (NITROSTAT) 0.4 MG SL tablet Place 1 tablet (0.4 mg total) under the tongue every 5 (five) minutes as needed for chest pain (MAX 3 TABLETS). 30 tablet 12  . NON FORMULARY at bedtime. CPAP    . oxyCODONE-acetaminophen (PERCOCET) 7.5-325 MG tablet Take 1 tablet by mouth every 4 (four) hours as needed for severe pain.    . pantoprazole (PROTONIX) 40 MG tablet Take 1 tablet (40 mg total) by mouth daily. 30 tablet 11  . potassium chloride (K-DUR) 10 MEQ tablet TAKE 1 TABLET(10 MEQ) BY MOUTH DAILY 30 tablet 6  . rivaroxaban (XARELTO) 20 MG TABS tablet TAKE 1 TABLET (20 MG TOTAL) BY MOUTH DAILY WITH SUPPER. 30 tablet 5  . tiZANidine (ZANAFLEX) 4 MG tablet      No current facility-administered medications for this visit.     LABS/IMAGING: No results found for this or any previous visit (from  the past 48  hour(s)). No results found.  VITALS: BP (!) 107/59   Pulse (!) 56   Ht 5\' 1"  (1.549 m)   Wt 255 lb 9.6 oz (115.9 kg)   BMI 48.30 kg/m   EXAM: General appearance: alert and no distress Neck: no carotid bruit, no JVD and thyroid not enlarged, symmetric, no tenderness/mass/nodules Lungs: clear to auscultation bilaterally Heart: regular rate and rhythm, S1, S2 normal, no murmur, click, rub or gallop Abdomen: soft, non-tender; bowel sounds normal; no masses,  no organomegaly Extremities: extremities normal, atraumatic, no cyanosis or edema Pulses: 2+ and symmetric Skin: Skin color, texture, turgor normal. No rashes or lesions Neurologic: Grossly normal Psych: Normal  EKG: Sinus bradycardia 56, QTC 407 ms  ASSESSMENT:  1. Low to intermediate risk for surgery 2. CAD s/p PCi to the entire RCA (50+ mm) with DES in 02/2013 and mid-LAD 02/2013 3. Paroxysmal atrial fibrillation - maintaining sinus on Tikosyn 4. OSA on CPAP 5. Low back pain - low risk for surgery 6. Pulmonary embolus of unknown etiology (02/2014) on Xarelto  PLAN: 1.   Mr. Miro is at acceptable risk for back surgery. He is advised to hold Xarelto 2 days prior to surgery and aspirin for 7 days prior to surgery. He should restart those as soon as practical from a bleeding standpoint after surgery. He is maintaining sinus on take his in and I would advise keeping him on take [place] and perioperatively. This should reduce the risk of periprocedural atrial fibrillation. Cardiology will be available as needed around the time of surgery, please contact us if you need [place]. Follow-up with me otherwise in 6 months.   Pixie Casino, MD, [facility] Attending Cardiologist [place] 08/23/2015, 12:56 PM

## 2015-09-29 ENCOUNTER — Other Ambulatory Visit (HOSPITAL_COMMUNITY): Payer: Self-pay | Admitting: Nurse Practitioner

## 2015-11-07 ENCOUNTER — Ambulatory Visit: Payer: BLUE CROSS/BLUE SHIELD | Attending: Neurosurgery | Admitting: Physical Therapy

## 2015-11-07 ENCOUNTER — Encounter: Payer: Self-pay | Admitting: Physical Therapy

## 2015-11-07 DIAGNOSIS — R262 Difficulty in walking, not elsewhere classified: Secondary | ICD-10-CM | POA: Diagnosis present

## 2015-11-07 DIAGNOSIS — M545 Low back pain: Secondary | ICD-10-CM

## 2015-11-07 DIAGNOSIS — R208 Other disturbances of skin sensation: Secondary | ICD-10-CM | POA: Diagnosis present

## 2015-11-07 NOTE — Therapy (Signed)
[place], [place], [address] Phone: [phone]   Fax:  [phone]  Physical Therapy Evaluation  Patient Details  Name: Paul Todd MRN: [id number] Date of Birth: [birth year] Referring Provider: Bonnee Quin PA-C (Surgeon Lavell Anchors, MD)  Encounter Date: 11/07/2015      PT End of Session - 11/07/15 2014    Visit Number 1   Number of Visits 13   Date for PT Re-Evaluation 12/21/15   Authorization Type BCBS    PT Start Time 1630   PT Stop Time 1722   PT Time Calculation (min) 52 min   Activity Tolerance Patient tolerated treatment well   Behavior During Therapy [facility] for tasks assessed/performed      Past Medical History:  Diagnosis Date  . Arthritis       . CAD (coronary artery disease)    multivessel per cath 02/12/13, s/p stenting to RCA  . Chronic lower back pain   . Dyslipidemia   . Hypertension   . Myocardial infarction 02/12/2013   . Nephrolithiasis   . OSA on CPAP   . PE (pulmonary thromboembolism) ([place]) 02/2014  . Persistent atrial fibrillation [facility])         Past Surgical History:  Procedure Laterality Date  . CARDIOVERSION N/A 02/15/2013   Procedure: CARDIOVERSION;  Surgeon: Thayer Headings, MD;  Location: [place];  Service: Cardiovascular;  Laterality: N/A;  . CARDIOVERSION N/A 07/07/2014   Procedure: CARDIOVERSION;  Surgeon: Pixie Casino, MD;  Location: [place];  Service: Cardiovascular;  Laterality: N/A;  . CORONARY ANGIOPLASTY WITH STENT PLACEMENT  02/12/2013  . CYSTOSCOPY WITH RETROGRADE PYELOGRAM, URETEROSCOPY AND STENT PLACEMENT Left 09/02/2013   Procedure: CYSTOSCOPY WITH RETROGRADE PYELOGRAM/LEFT URETEROSCOPY/URETERAL STENT, with fulguration;  Surgeon: Festus Aloe, MD;  [facility];  Service: Urology;  Laterality: Left;  . CYSTOSCOPY WITH URETEROSCOPY AND STENT PLACEMENT Left 09/16/2013   Procedure: CYSTOSCOPY WITH URETEROSCOPY AND STENT PLACEMENT;   Surgeon: Festus Aloe, MD;  [facility];  Service: Urology;  Laterality: Left;  . HOLMIUM LASER APPLICATION Left [id number]   Procedure: HOLMIUM LASER APPLICATION;  Surgeon: Festus Aloe, MD;  [facility];  Service: Urology;  Laterality: Left;  . LEFT HEART CATHETERIZATION WITH CORONARY ANGIOGRAM N/A 02/12/2013   Procedure: LEFT HEART CATHETERIZATION WITH CORONARY ANGIOGRAM;  Surgeon: Troy Sine, MD;  Location: [facility] CATH LAB;  Service: Cardiovascular;  Laterality: N/A;  . PERCUTANEOUS CORONARY STENT INTERVENTION (PCI-S)  02/12/2013   Procedure: PERCUTANEOUS CORONARY STENT INTERVENTION (PCI-S);  Surgeon: Troy Sine, MD;  Location: [facility] CATH LAB;  Service: Cardiovascular;;  STEMI  . PERCUTANEOUS CORONARY STENT INTERVENTION (PCI-S) N/A 02/14/2013   Procedure: PERCUTANEOUS CORONARY STENT INTERVENTION (PCI-S);  Surgeon: Burnell Blanks, MD;  Location: [facility] CATH LAB;  Service: Cardiovascular;  Laterality: N/A;  Proximal LAD  . TEE WITHOUT CARDIOVERSION N/A 02/15/2013   Procedure: TRANSESOPHAGEAL ECHOCARDIOGRAM (TEE);  Surgeon: Thayer Headings, MD;  Location: [place];  Service: Cardiovascular;  Laterality: N/A;    There were no vitals filed for this visit.       Subjective Assessment - 11/07/15 1633    Subjective Pt reports back feels tight, some good days some bad. Location of surgery is tender to palpation and when sitting against a chair. Bilateral hip pain, both feet feel sensitive "like I am walking on pillows"  burning and tingling at toes. Popping noted in low back 80% of the time when he lays down, most pronounced when leg  is in pain.   How long can you sit comfortably? 45 min-1 hr   How long can you walk comfortably? 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 Yes   Pain Score 3   up to 6-7/10 in last few days    Pain Location Back   Pain Orientation Lower   Pain Descriptors / Indicators Stabbing;Aching;Spasm   Pain Type Surgical pain   Pain Radiating Towards bilateral LE   Aggravating Factors  overdoing it, sitting against back rest   Pain Relieving Factors elevate feet, rest            [facility] PT Assessment - 11/07/15 0001      Assessment   Medical Diagnosis lumbar stenosis with neural impingement L3/4 4/5 5/S1; lumbar spondylosis with eipdural lipomatosis  s/p surgical intervention    Referring Provider Bonnee Quin PA-C  Surgeon Lavell Anchors, MD   Next MD Visit Nov 28   Prior Therapy no     Precautions   Precaution Comments limit lifting 20-30lb, avoid twisting     Restrictions   Weight Bearing Restrictions No     Balance Screen   Has the patient fallen in the past 6 months Yes  fell the day he came home from surgery on R side     [place] residence   Living Arrangements Spouse/significant other;Children   Additional Comments no stairs     Prior Function   Level of Independence Independent     Cognition   Overall Cognitive Status Within Functional Limits for tasks assessed     Sensation   Additional Comments S1 sensory decreased on L. unable to correctly distinguish 1 v 2 points     Posture/Postural Control   Posture Comments anterior pelvic tilt, lacking full knee extension      Palpation   Palpation comment TTP bilateral QL and at surgical site                   [facility] Adult PT Treatment/Exercise - 11/07/15 0001      Exercises   Exercises Lumbar     Lumbar Exercises: Supine   AB Set Limitations hooklying, heavy cuing required   Bent Knee Raise Limitations hooklying marches                 PT Education - 11/07/15 2014    Education provided Yes   Education Details anatomy of condition, POC, HEP, exercise form/rationale, posture flexion v extension, balance challenges   Person(s) Educated Patient    Methods Explanation;Demonstration;Tactile cues;Verbal cues;Handout   Comprehension Verbalized understanding;Returned demonstration;Verbal cues required;Tactile cues required;Need further instruction          PT Short Term Goals - 11/07/15 2025      PT SHORT TERM GOAL #1   Title pt will demonstrate ability to maintain core contraction in supine, seated and standing by 11/17   Baseline began educating at eval   Time 3   Period Weeks   Status New     PT SHORT TERM GOAL #2   Title Pt will verbalize ability to utilize abdominal contractions and flexion bias exercises to decrease pain with daily activities   Baseline began educating at eval   Time 3   Period Weeks   Status New  PT Long Term Goals - 11/07/15 2027      PT LONG TERM GOAL #1   Title Pt will be able to pull a suitcase with minimal discomfort in order to travel for work by 12/15   Baseline unable at eval   Time 6   Period Weeks   Status New     PT LONG TERM GOAL #2   Title Pt will be able to sit in a chair with back support without limitaiton by low back tenderness   Baseline unable at eval, 5 min or less   Time 6   Period Weeks   Status New     PT LONG TERM GOAL #3   Title Pt will demonstrate proper lifting and handling of objects floor<>chest height to complete necessary work activities   Baseline unable at eval   Time 6   Period Weeks   Status New     PT LONG TERM GOAL #4   Title Pt will verbalize at least 50% improvement in sensation of leg weakness when walking   Baseline feels weak after just a few minutes of walking at eval   Time 6   Period Weeks   Status New               Plan - 11/07/15 2015    Clinical Impression Statement Pt presents to PT with complaints of back pain with bilateral lower extremity radicular symtpoms. Pt underwent surgical intervention on 09/12/15: bilateral complete L4, L5 partial L3, S1 decompressive laminectomies with bilateral partial L3/4 4/5 medial  facetectomies and bilateral L3, 4, 5 foraminotomies. Pt has poor resting abdominal control resulting in increased lumbar lordosis and anterior pelvic tilt. 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 Pt will benefit from skilled PT in order to improve abdominal support to lumbar spine as well as challenge balance to decrease fall risk assosicated with decreased sensory input to lower extremities.    Rehab Potential Good   PT Frequency 2x / week   PT Duration 6 weeks   PT Treatment/Interventions ADLs/Self Care Home Management;Cryotherapy;Electrical Stimulation;Iontophoresis 4mg /ml Dexamethasone;Functional mobility training;Stair training;Gait training;Ultrasound;Traction;Moist Heat;Therapeutic activities;Therapeutic exercise;Balance training;Neuromuscular re-education;Patient/family education;Passive range of motion;Manual techniques;Dry needling;Taping   PT Next Visit Plan lumbo pelvic stability, flexion bias   PT Home Exercise Plan abdominal sets, hooklying marches   Consulted and Agree with Plan of Care Patient      Patient will benefit from skilled therapeutic intervention in order to improve the following deficits and impairments:  Pain, Increased muscle spasms, Decreased activity tolerance, Decreased balance, Improper body mechanics, Postural dysfunction, Impaired sensation, Decreased strength  Visit Diagnosis: Acute bilateral low back pain, with sciatica presence unspecified - Plan: PT plan of care cert/re-cert  Difficulty in walking, not elsewhere classified - Plan: PT plan of care cert/re-cert  Other disturbances of skin sensation - Plan: PT plan of care cert/re-cert     Problem List Patient Active Problem List   Diagnosis Date Noted  . Pre-operative cardiovascular examination 08/23/2015  . PVC (premature ventricular contraction) 07/24/2014  . Encounter for cardioversion procedure   . Persistent atrial fibrillation  ([place])   . Pulmonary embolus ([place]) 06/20/2014  . Back pain 02/21/2014  . Chest pain 09/05/2013  . Nephrolithiasis 09/05/2013  . Hematuria, gross 09/05/2013  . Nausea and vomiting 09/05/2013  . Nausea & vomiting   . Old myocardial infarction   . Emesis 09/02/2013  . OSA (obstructive sleep apnea) 07/22/2013  . Obesity (  BMI 35.0-39.9 without comorbidity) 03/07/2013  . Unstable angina - Post Infarct Angina with existing LAD disease 02/14/2013  . CAD S/P percutaneous coronary angioplasty - DES to Mid LAD; 2x 10mm x 38 mm Xience DES to RCA 02/14/2013  . Dyslipidemia 02/12/2013  . NSTEMI (non-ST elevated myocardial infarction) ([place]) 01/30/2013  . Tobacco abuse 01/30/2013  . Fatigue 01/30/2013  . PAF (paroxysmal atrial fibrillation) ([place]) 01/30/2013    Teresina Bugaj C. Lorren Rossetti PT, DPT 11/07/15 8:35 PM   [facility] [address] [place], [place], [address] Phone: [phone]   Fax:  [phone]  Name: Paul Todd MRN: [id number] Date of Birth: 02-Mar-1960

## 2015-11-14 ENCOUNTER — Ambulatory Visit: Payer: BLUE CROSS/BLUE SHIELD | Admitting: Physical Therapy

## 2015-11-14 DIAGNOSIS — M545 Low back pain: Secondary | ICD-10-CM | POA: Diagnosis not present

## 2015-11-14 DIAGNOSIS — R208 Other disturbances of skin sensation: Secondary | ICD-10-CM

## 2015-11-14 DIAGNOSIS — R262 Difficulty in walking, not elsewhere classified: Secondary | ICD-10-CM

## 2015-11-14 NOTE — Therapy (Signed)
[place], [place], [address] Phone: [phone]   Fax:  [phone]  Physical Therapy Treatment  Patient Details  Name: Paul Todd MRN: [id number] Date of Birth: 1960-09-02 Referring Provider: Bonnee Quin PA-C (Surgeon Lavell Anchors, MD)  Encounter Date: 11/14/2015      PT End of Session - 11/14/15 1015    Visit Number 2   Number of Visits 13   Date for PT Re-Evaluation 12/21/15   Authorization Type BCBS    PT Start Time 1016   PT Stop Time 1059   PT Time Calculation (min) 43 min   Activity Tolerance Patient tolerated treatment well   Behavior During Therapy [facility] for tasks assessed/performed      Past Medical History:  Diagnosis Date  . Arthritis       . CAD (coronary artery disease)    multivessel per cath 02/12/13, s/p stenting to RCA  . Chronic lower back pain   . Dyslipidemia   . Hypertension   . Myocardial infarction 02/12/2013   . Nephrolithiasis   . OSA on CPAP   . PE (pulmonary thromboembolism) ([place]) 02/2014  . Persistent atrial fibrillation [facility])         Past Surgical History:  Procedure Laterality Date  . CARDIOVERSION N/A 02/15/2013   Procedure: CARDIOVERSION;  Surgeon: Thayer Headings, MD;  Location: [facility];  Service: Cardiovascular;  Laterality: N/A;  . CARDIOVERSION N/A 07/07/2014   Procedure: CARDIOVERSION;  Surgeon: Pixie Casino, MD;  Location: [place];  Service: Cardiovascular;  Laterality: N/A;  . CORONARY ANGIOPLASTY WITH STENT PLACEMENT  02/12/2013  . CYSTOSCOPY WITH RETROGRADE PYELOGRAM, URETEROSCOPY AND STENT PLACEMENT Left 09/02/2013   Procedure: CYSTOSCOPY WITH RETROGRADE PYELOGRAM/LEFT URETEROSCOPY/URETERAL STENT, with fulguration;  Surgeon: Festus Aloe, MD;  [facility];  Service: Urology;  Laterality: Left;  . CYSTOSCOPY WITH URETEROSCOPY AND STENT PLACEMENT Left 09/16/2013   Procedure: CYSTOSCOPY WITH URETEROSCOPY AND STENT PLACEMENT;   Surgeon: Festus Aloe, MD;  [facility];  Service: Urology;  Laterality: Left;  . HOLMIUM LASER APPLICATION Left [id number]   Procedure: HOLMIUM LASER APPLICATION;  Surgeon: Festus Aloe, MD;  [facility];  Service: Urology;  Laterality: Left;  . LEFT HEART CATHETERIZATION WITH CORONARY ANGIOGRAM N/A 02/12/2013   Procedure: LEFT HEART CATHETERIZATION WITH CORONARY ANGIOGRAM;  Surgeon: Troy Sine, MD;  Location: [facility] CATH LAB;  Service: Cardiovascular;  Laterality: N/A;  . PERCUTANEOUS CORONARY STENT INTERVENTION (PCI-S)  02/12/2013   Procedure: PERCUTANEOUS CORONARY STENT INTERVENTION (PCI-S);  Surgeon: Troy Sine, MD;  Location: [facility] CATH LAB;  Service: Cardiovascular;;  STEMI  . PERCUTANEOUS CORONARY STENT INTERVENTION (PCI-S) N/A 02/14/2013   Procedure: PERCUTANEOUS CORONARY STENT INTERVENTION (PCI-S);  Surgeon: Burnell Blanks, MD;  Location: [facility] CATH LAB;  Service: Cardiovascular;  Laterality: N/A;  Proximal LAD  . TEE WITHOUT CARDIOVERSION N/A 02/15/2013   Procedure: TRANSESOPHAGEAL ECHOCARDIOGRAM (TEE);  Surgeon: Thayer Headings, MD;  Location: [place];  Service: Cardiovascular;  Laterality: N/A;    There were no vitals filed for this visit.      Subjective Assessment - 11/14/15 1016    Subjective Pain is not bad today. tingling into both legs.    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 Yes   Pain Score 2    Pain Location Back   Pain Orientation Lower  [facility] Adult PT Treatment/Exercise - 11/14/15 0001      Lumbar Exercises: Stretches   Passive Hamstring Stretch Limitations seated EOB   Quad Stretch Limitations thomas test position     Lumbar Exercises: Aerobic   Stationary Bike L4 7 min     Lumbar Exercises: Standing   Other Standing Lumbar Exercises GHJ flx from post resistance   Other Standing Lumbar Exercises static  diagonals and lat pulls anchored resistance for abdominal activation     Lumbar Exercises: Supine   Bent Knee Raise Limitations march with UE resistance   Straight Leg Raises Limitations straight leg lower/raise from 90/90   Other Supine Lumbar Exercises diagonal press UE/LE green tband                PT Education - 11/14/15 1021    Education provided Yes   Education Details exercise form/rationale, HEP, water walking   Person(s) Educated Patient   Methods Explanation;Demonstration;Tactile cues;Verbal cues   Comprehension Verbalized understanding;Returned demonstration;Verbal cues required;Tactile cues required;Need further instruction          PT Short Term Goals - 11/07/15 2025      PT SHORT TERM GOAL #1   Title pt will demonstrate ability to maintain core contraction in supine, seated and standing by 11/17   Baseline began educating at eval   Time 3   Period Weeks   Status New     PT SHORT TERM GOAL #2   Title Pt will verbalize ability to utilize abdominal contractions and flexion bias exercises to decrease pain with daily activities   Baseline began educating at eval   Time 3   Period Weeks   Status New           PT Long Term Goals - 11/07/15 2027      PT LONG TERM GOAL #1   Title Pt will be able to pull a suitcase with minimal discomfort in order to travel for work by 12/15   Baseline unable at eval   Time 6   Period Weeks   Status New     PT LONG TERM GOAL #2   Title Pt will be able to sit in a chair with back support without limitaiton by low back tenderness   Baseline unable at eval, 5 min or less   Time 6   Period Weeks   Status New     PT LONG TERM GOAL #3   Title Pt will demonstrate proper lifting and handling of objects floor<>chest height to complete necessary work activities   Baseline unable at eval   Time 6   Period Weeks   Status New     PT LONG TERM GOAL #4   Title Pt will verbalize at least 50% improvement in sensation of leg  weakness when walking   Baseline feels weak after just a few minutes of walking at eval   Time 6   Period Weeks   Status New               Plan - 11/14/15 1231    Clinical Impression Statement Pt tends to be upper extremity dominant and is very strong through chest/shoulders due to work activities, this results in difficulty in activating core with upper extremity movements due to compensation patterns. Did well with corrdinated movements of lower extremities with abdominal control. Denied increase in pain with exercises.    PT Next Visit Plan lumbo pelvic stability, flexion bias, balance   PT Home Exercise Plan abdominal  sets, hooklying marches, thomas stretch for hip flexors, seated HSS.    Consulted and Agree with Plan of Care Patient      Patient will benefit from skilled therapeutic intervention in order to improve the following deficits and impairments:     Visit Diagnosis: Acute bilateral low back pain, with sciatica presence unspecified  Difficulty in walking, not elsewhere classified  Other disturbances of skin sensation     Problem List Patient Active Problem List   Diagnosis Date Noted  . Pre-operative cardiovascular examination 08/23/2015  . PVC (premature ventricular contraction) 07/24/2014  . Encounter for cardioversion procedure   . Persistent atrial fibrillation ([place])   . Pulmonary embolus ([place]) 06/20/2014  . Back pain 02/21/2014  . Chest pain 09/05/2013  . Nephrolithiasis 09/05/2013  . Hematuria, gross 09/05/2013  . Nausea and vomiting 09/05/2013  . Nausea & vomiting   . Old myocardial infarction   . Emesis 09/02/2013  . OSA (obstructive sleep apnea) 07/22/2013  . Obesity (BMI 35.0-39.9 without comorbidity) 03/07/2013  . Unstable angina - Post Infarct Angina with existing LAD disease 02/14/2013  . CAD S/P percutaneous coronary angioplasty - DES to Mid LAD; 2x 38mm x 38 mm Xience DES to RCA 02/14/2013  . Dyslipidemia 02/12/2013  . NSTEMI (non-ST  elevated myocardial infarction) ([place]) 01/30/2013  . Tobacco abuse 01/30/2013  . Fatigue 01/30/2013  . PAF (paroxysmal atrial fibrillation) ([place]) 01/30/2013   Paul Todd PT, DPT 11/14/15 12:35 PM   [facility] [address] [place], [place], [address] Phone: [phone]   Fax:  [phone]  Name: Paul Todd MRN: [id number] Date of Birth: 1960-05-01

## 2015-11-16 ENCOUNTER — Encounter: Payer: Self-pay | Admitting: Physical Therapy

## 2015-11-16 ENCOUNTER — Ambulatory Visit: Payer: BLUE CROSS/BLUE SHIELD | Admitting: Physical Therapy

## 2015-11-16 DIAGNOSIS — M545 Low back pain: Secondary | ICD-10-CM

## 2015-11-16 DIAGNOSIS — R262 Difficulty in walking, not elsewhere classified: Secondary | ICD-10-CM

## 2015-11-16 DIAGNOSIS — R208 Other disturbances of skin sensation: Secondary | ICD-10-CM

## 2015-11-16 NOTE — Therapy (Signed)
[place], [place], [address] Phone: [phone]   Fax:  [phone]  Physical Therapy Treatment  Patient Details  Name: Paul Todd MRN: [id number] Date of Birth: Jun 25, 1960 Referring Provider: Bonnee Quin PA-C (Surgeon Lavell Anchors, MD)  Encounter Date: 11/16/2015      PT End of Session - 11/16/15 1026    Visit Number 3   Number of Visits 13   Date for PT Re-Evaluation 12/21/15   Authorization Type BCBS    PT Start Time 1019   PT Stop Time 1101   PT Time Calculation (min) 42 min   Activity Tolerance Patient tolerated treatment well   Behavior During Therapy [facility] for tasks assessed/performed      Past Medical History:  Diagnosis Date  . Arthritis       . CAD (coronary artery disease)    multivessel per cath 02/12/13, s/p stenting to RCA  . Chronic lower back pain   . Dyslipidemia   . Hypertension   . Myocardial infarction 02/12/2013   . Nephrolithiasis   . OSA on CPAP   . PE (pulmonary thromboembolism) ([place]) 02/2014  . Persistent atrial fibrillation [facility])         Past Surgical History:  Procedure Laterality Date  . CARDIOVERSION N/A 02/15/2013   Procedure: CARDIOVERSION;  Surgeon: Thayer Headings, MD;  Location: [place];  Service: Cardiovascular;  Laterality: N/A;  . CARDIOVERSION N/A 07/07/2014   Procedure: CARDIOVERSION;  Surgeon: Pixie Casino, MD;  Location: [place];  Service: Cardiovascular;  Laterality: N/A;  . CORONARY ANGIOPLASTY WITH STENT PLACEMENT  02/12/2013  . CYSTOSCOPY WITH RETROGRADE PYELOGRAM, URETEROSCOPY AND STENT PLACEMENT Left 09/02/2013   Procedure: CYSTOSCOPY WITH RETROGRADE PYELOGRAM/LEFT URETEROSCOPY/URETERAL STENT, with fulguration;  Surgeon: Festus Aloe, MD;  [facility];  Service: Urology;  Laterality: Left;  . CYSTOSCOPY WITH URETEROSCOPY AND STENT PLACEMENT Left 09/16/2013   Procedure: CYSTOSCOPY WITH URETEROSCOPY AND STENT PLACEMENT;   Surgeon: Festus Aloe, MD;  [facility];  Service: Urology;  Laterality: Left;  . HOLMIUM LASER APPLICATION Left [id number]   Procedure: HOLMIUM LASER APPLICATION;  Surgeon: Festus Aloe, MD;  [facility];  Service: Urology;  Laterality: Left;  . LEFT HEART CATHETERIZATION WITH CORONARY ANGIOGRAM N/A 02/12/2013   Procedure: LEFT HEART CATHETERIZATION WITH CORONARY ANGIOGRAM;  Surgeon: Troy Sine, MD;  Location: [facility] CATH LAB;  Service: Cardiovascular;  Laterality: N/A;  . PERCUTANEOUS CORONARY STENT INTERVENTION (PCI-S)  02/12/2013   Procedure: PERCUTANEOUS CORONARY STENT INTERVENTION (PCI-S);  Surgeon: Troy Sine, MD;  Location: [facility] CATH LAB;  Service: Cardiovascular;;  STEMI  . PERCUTANEOUS CORONARY STENT INTERVENTION (PCI-S) N/A 02/14/2013   Procedure: PERCUTANEOUS CORONARY STENT INTERVENTION (PCI-S);  Surgeon: Burnell Blanks, MD;  Location: [facility] CATH LAB;  Service: Cardiovascular;  Laterality: N/A;  Proximal LAD  . TEE WITHOUT CARDIOVERSION N/A 02/15/2013   Procedure: TRANSESOPHAGEAL ECHOCARDIOGRAM (TEE);  Surgeon: Thayer Headings, MD;  Location: [place];  Service: Cardiovascular;  Laterality: N/A;    There were no vitals filed for this visit.      Subjective Assessment - 11/16/15 1026    Subjective Was sore yesterday. Back is a little sore today. Bilateral feet/toes tingling. Verbalized frustration in decreased ambulation ability and pain in feet/legs   Currently in Pain? Yes   Pain Score 3    Pain Location Back   Pain Orientation Lower   Pain Descriptors / Indicators Sore  [facility] Adult PT Treatment/Exercise - 11/16/15 0001      Lumbar Exercises: Stretches   Passive Hamstring Stretch Limitations seated EOB with strap   Double Knee to Chest Stretch Limitations iso hold 1 min   Lower Trunk Rotation Limitations x5 each direction   Quadruped Mid Back Stretch Limitations seated flexion roll out on physioball   Quad  Stretch Limitations thomas test   Piriformis Stretch Limitations gastroc/soleus stretch slant board     Lumbar Exercises: Aerobic   Stationary Bike nu step L4 7 miin     Lumbar Exercises: Standing   Heel Raises 20 reps  bilat   Heel Raises Limitations in wide tandem     Lumbar Exercises: Supine   Bent Knee Raise Limitations bilat 90/90, alt knee flx/ext, ball bw knees   Bridge Limitations beginner bridge, cues for pelvic tilt                PT Education - 11/16/15 1101    Education provided Yes   Education Details exercise form/rationale, HEP, discussion of anatomy of condition with stenosis and resolution of nerve root impingement from surgery, discussed benefits of surgery that he had    Person(s) Educated Patient   Methods Explanation;Demonstration;Tactile cues;Verbal cues   Comprehension Verbalized understanding;Returned demonstration;Verbal cues required;Tactile cues required;Need further instruction          PT Short Term Goals - 11/07/15 2025      PT SHORT TERM GOAL #1   Title pt will demonstrate ability to maintain core contraction in supine, seated and standing by 11/17   Baseline began educating at eval   Time 3   Period Weeks   Status New     PT SHORT TERM GOAL #2   Title Pt will verbalize ability to utilize abdominal contractions and flexion bias exercises to decrease pain with daily activities   Baseline began educating at eval   Time 3   Period Weeks   Status New           PT Long Term Goals - 11/07/15 2027      PT LONG TERM GOAL #1   Title Pt will be able to pull a suitcase with minimal discomfort in order to travel for work by 12/15   Baseline unable at eval   Time 6   Period Weeks   Status New     PT LONG TERM GOAL #2   Title Pt will be able to sit in a chair with back support without limitaiton by low back tenderness   Baseline unable at eval, 5 min or less   Time 6   Period Weeks   Status New     PT LONG TERM GOAL #3   Title  Pt will demonstrate proper lifting and handling of objects floor<>chest height to complete necessary work activities   Baseline unable at eval   Time 6   Period Weeks   Status New     PT LONG TERM GOAL #4   Title Pt will verbalize at least 50% improvement in sensation of leg weakness when walking   Baseline feels weak after just a few minutes of walking at eval   Time 6   Period Weeks   Status New               Plan - 11/16/15 1103    Clinical Impression Statement Pt verbalized soreness in abdominal wall indicating improved coordination in use during exercises. HEld discussion today regarding benefits that he gained  by having surgery as well as progressive nature of stenosis. Discussed management of stenosis with conservative therapy and lifestly changes, we will evaluate changes/effectiveness when his f/u comes up on the 28th.    PT Next Visit Plan flexion bias, balance, core/glut coordination   Consulted and Agree with Plan of Care Patient      Patient will benefit from skilled therapeutic intervention in order to improve the following deficits and impairments:     Visit Diagnosis: Acute bilateral low back pain, with sciatica presence unspecified  Difficulty in walking, not elsewhere classified  Other disturbances of skin sensation     Problem List Patient Active Problem List   Diagnosis Date Noted  . Pre-operative cardiovascular examination 08/23/2015  . PVC (premature ventricular contraction) 07/24/2014  . Encounter for cardioversion procedure   . Persistent atrial fibrillation ([place])   . Pulmonary embolus ([place]) 06/20/2014  . Back pain 02/21/2014  . Chest pain 09/05/2013  . Nephrolithiasis 09/05/2013  . Hematuria, gross 09/05/2013  . Nausea and vomiting 09/05/2013  . Nausea & vomiting   . Old myocardial infarction   . Emesis 09/02/2013  . OSA (obstructive sleep apnea) 07/22/2013  . Obesity (BMI 35.0-39.9 without comorbidity) 03/07/2013  . Unstable angina  - Post Infarct Angina with existing LAD disease 02/14/2013  . CAD S/P percutaneous coronary angioplasty - DES to Mid LAD; 2x 17mm x 38 mm Xience DES to RCA 02/14/2013  . Dyslipidemia 02/12/2013  . NSTEMI (non-ST elevated myocardial infarction) ([place]) 01/30/2013  . Tobacco abuse 01/30/2013  . Fatigue 01/30/2013  . PAF (paroxysmal atrial fibrillation) ([place]) 01/30/2013    Mizael Sagar C. Avyukt Cimo PT, DPT 11/16/15 11:41 AM   [facility] [address] [place], [place], [address] Phone: [phone]   Fax:  [phone]  Name: Paul Todd MRN: [id number] Date of Birth: Sep 19, 1960

## 2015-11-21 ENCOUNTER — Ambulatory Visit: Payer: BLUE CROSS/BLUE SHIELD | Admitting: Physical Therapy

## 2015-11-21 ENCOUNTER — Encounter: Payer: Self-pay | Admitting: Physical Therapy

## 2015-11-21 DIAGNOSIS — R208 Other disturbances of skin sensation: Secondary | ICD-10-CM

## 2015-11-21 DIAGNOSIS — M545 Low back pain: Secondary | ICD-10-CM

## 2015-11-21 DIAGNOSIS — R262 Difficulty in walking, not elsewhere classified: Secondary | ICD-10-CM

## 2015-11-21 NOTE — Therapy (Signed)
[place], [place], [address] Phone: [phone]   Fax:  [phone]  Physical Therapy Treatment  Patient Details  Name: Paul Todd MRN: [id number] Date of Birth: 13-Apr-1960 Referring Provider: Bonnee Quin PA-C (Surgeon Lavell Anchors, MD)  Encounter Date: 11/21/2015      PT End of Session - 11/21/15 0853    Visit Number 4   Number of Visits 13   Date for PT Re-Evaluation 12/21/15   Authorization Type BCBS    PT Start Time 0847   PT Stop Time 0935   PT Time Calculation (min) 48 min   Activity Tolerance Patient limited by pain   Behavior During Therapy [facility] for tasks assessed/performed      Past Medical History:  Diagnosis Date  . Arthritis       . CAD (coronary artery disease)    multivessel per cath 02/12/13, s/p stenting to RCA  . Chronic lower back pain   . Dyslipidemia   . Hypertension   . Myocardial infarction 02/12/2013   . Nephrolithiasis   . OSA on CPAP   . PE (pulmonary thromboembolism) ([place]) 02/2014  . Persistent atrial fibrillation [facility])         Past Surgical History:  Procedure Laterality Date  . CARDIOVERSION N/A 02/15/2013   Procedure: CARDIOVERSION;  Surgeon: Thayer Headings, MD;  Location: [place];  Service: Cardiovascular;  Laterality: N/A;  . CARDIOVERSION N/A 07/07/2014   Procedure: CARDIOVERSION;  Surgeon: Pixie Casino, MD;  Location: [place];  Service: Cardiovascular;  Laterality: N/A;  . CORONARY ANGIOPLASTY WITH STENT PLACEMENT  02/12/2013  . CYSTOSCOPY WITH RETROGRADE PYELOGRAM, URETEROSCOPY AND STENT PLACEMENT Left 09/02/2013   Procedure: CYSTOSCOPY WITH RETROGRADE PYELOGRAM/LEFT URETEROSCOPY/URETERAL STENT, with fulguration;  Surgeon: Festus Aloe, MD;  [facility];  Service: Urology;  Laterality: Left;  . CYSTOSCOPY WITH URETEROSCOPY AND STENT PLACEMENT Left 09/16/2013   Procedure: CYSTOSCOPY WITH URETEROSCOPY AND STENT PLACEMENT;  Surgeon:  Festus Aloe, MD;  [facility];  Service: Urology;  Laterality: Left;  . HOLMIUM LASER APPLICATION Left [id number]   Procedure: HOLMIUM LASER APPLICATION;  Surgeon: Festus Aloe, MD;  [facility];  Service: Urology;  Laterality: Left;  . LEFT HEART CATHETERIZATION WITH CORONARY ANGIOGRAM N/A 02/12/2013   Procedure: LEFT HEART CATHETERIZATION WITH CORONARY ANGIOGRAM;  Surgeon: Troy Sine, MD;  Location: [facility] CATH LAB;  Service: Cardiovascular;  Laterality: N/A;  . PERCUTANEOUS CORONARY STENT INTERVENTION (PCI-S)  02/12/2013   Procedure: PERCUTANEOUS CORONARY STENT INTERVENTION (PCI-S);  Surgeon: Troy Sine, MD;  Location: [facility] CATH LAB;  Service: Cardiovascular;;  STEMI  . PERCUTANEOUS CORONARY STENT INTERVENTION (PCI-S) N/A 02/14/2013   Procedure: PERCUTANEOUS CORONARY STENT INTERVENTION (PCI-S);  Surgeon: Burnell Blanks, MD;  Location: [facility] CATH LAB;  Service: Cardiovascular;  Laterality: N/A;  Proximal LAD  . TEE WITHOUT CARDIOVERSION N/A 02/15/2013   Procedure: TRANSESOPHAGEAL ECHOCARDIOGRAM (TEE);  Surgeon: Thayer Headings, MD;  Location: [place];  Service: Cardiovascular;  Laterality: N/A;    There were no vitals filed for this visit.      Subjective Assessment - 11/21/15 0849    Subjective Reported having severe tightness and soreness beginning Friday evening, began easing on Sunday. No change in LE radicular symptoms. Reports wife reported he was ambulating with a flexed posture more than normal .   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 Yes   Pain Score 4  Pain Location Back   Pain Orientation Lower   Pain Descriptors / Indicators Tightness;Sore                         [facility] Adult PT Treatment/Exercise - 11/21/15 0001      Lumbar Exercises: Stretches   Passive Hamstring Stretch Limitations seated EOB with strap   Single Knee to Chest Stretch 5  reps;10 seconds   Lower Trunk Rotation Limitations x5 each direction   Piriformis Stretch 30 seconds  figure 4 position   Piriformis Stretch Limitations gastroc/soleus stretch slant board     Lumbar Exercises: Aerobic   Stationary Bike nu step L6 5 min     Lumbar Exercises: Supine   AB Set Limitations bilat LE on physioball   Straight Leg Raise 20 reps   Straight Leg Raises Limitations straight leg lower/raise from 90/90   Other Supine Lumbar Exercises ab set with iso hamstring curl over physioball     Modalities   Modalities Electrical Stimulation;Moist Heat     Moist Heat Therapy   Number Minutes Moist Heat 15 Minutes   Moist Heat Location Lumbar Spine  concurrent with ESTIM     Electrical Stimulation   Electrical Stimulation Location Low back   Electrical Stimulation Action IFC   Electrical Stimulation Parameters 15' concurrent with heat   Electrical Stimulation Goals Pain                PT Education - 11/21/15 [id number]    Education provided Yes   Education Details exercise form/rationale   Person(s) Educated Patient   Methods Explanation;Demonstration;Tactile cues;Verbal cues   Comprehension Verbalized understanding;Verbal cues required;Tactile cues required;Returned demonstration;Need further instruction          PT Short Term Goals - 11/21/15 0923      PT SHORT TERM GOAL #1   Title pt will demonstrate ability to maintain core contraction in supine, seated and standing by 11/17   Status Achieved     PT SHORT TERM GOAL #2   Title Pt will verbalize ability to utilize abdominal contractions and flexion bias exercises to decrease pain with daily activities   Baseline helps to stand up straighter but reports minimal effects on pain.    Status On-going           PT Long Term Goals - 11/07/15 2027      PT LONG TERM GOAL #1   Title Pt will be able to pull a suitcase with minimal discomfort in order to travel for work by 12/15   Baseline unable at eval    Time 6   Period Weeks   Status New     PT LONG TERM GOAL #2   Title Pt will be able to sit in a chair with back support without limitaiton by low back tenderness   Baseline unable at eval, 5 min or less   Time 6   Period Weeks   Status New     PT LONG TERM GOAL #3   Title Pt will demonstrate proper lifting and handling of objects floor<>chest height to complete necessary work activities   Baseline unable at eval   Time 6   Period Weeks   Status New     PT LONG TERM GOAL #4   Title Pt will verbalize at least 50% improvement in sensation of leg weakness when walking   Baseline feels weak after just a few minutes of walking at eval   Time 6  Period Weeks   Status New               Plan - 11/21/15 0920    Clinical Impression Statement Pt reports his back began hurting when he was sitting upright to stretch hamstrings with strap, denied pain with supine exercises.    PT Next Visit Plan abdominal engagement, flexion bias, supine hamstring stretch rather than seated, soft tissue work to low back   PT Home Exercise Plan abdominal sets, hooklying marches, thomas stretch for hip flexors, seated HSS.    Consulted and Agree with Plan of Care Patient      Patient will benefit from skilled therapeutic intervention in order to improve the following deficits and impairments:     Visit Diagnosis: Acute bilateral low back pain, with sciatica presence unspecified  Difficulty in walking, not elsewhere classified  Other disturbances of skin sensation     Problem List Patient Active Problem List   Diagnosis Date Noted  . Pre-operative cardiovascular examination 08/23/2015  . PVC (premature ventricular contraction) 07/24/2014  . Encounter for cardioversion procedure   . Persistent atrial fibrillation ([place])   . Pulmonary embolus ([place]) 06/20/2014  . Back pain 02/21/2014  . Chest pain 09/05/2013  . Nephrolithiasis 09/05/2013  . Hematuria, gross 09/05/2013  . Nausea and  vomiting 09/05/2013  . Nausea & vomiting   . Old myocardial infarction   . Emesis 09/02/2013  . OSA (obstructive sleep apnea) 07/22/2013  . Obesity (BMI 35.0-39.9 without comorbidity) 03/07/2013  . Unstable angina - Post Infarct Angina with existing LAD disease 02/14/2013  . CAD S/P percutaneous coronary angioplasty - DES to Mid LAD; 2x 22mm x 38 mm Xience DES to RCA 02/14/2013  . Dyslipidemia 02/12/2013  . NSTEMI (non-ST elevated myocardial infarction) ([place]) 01/30/2013  . Tobacco abuse 01/30/2013  . Fatigue 01/30/2013  . PAF (paroxysmal atrial fibrillation) ([place]) 01/30/2013    Kacie Huxtable C. Holly Pring PT, DPT 11/21/15 9:26 AM   [facility] [address] [place], [place], [address] Phone: [phone]   Fax:  [phone]  Name: SLATE BOEDIGHEIMER MRN: [id number] Date of Birth: [birth year]

## 2015-11-23 ENCOUNTER — Ambulatory Visit: Payer: BLUE CROSS/BLUE SHIELD | Admitting: Physical Therapy

## 2015-11-23 ENCOUNTER — Encounter: Payer: Self-pay | Admitting: Physical Therapy

## 2015-11-23 DIAGNOSIS — R262 Difficulty in walking, not elsewhere classified: Secondary | ICD-10-CM

## 2015-11-23 DIAGNOSIS — R208 Other disturbances of skin sensation: Secondary | ICD-10-CM

## 2015-11-23 DIAGNOSIS — M545 Low back pain: Secondary | ICD-10-CM

## 2015-11-23 NOTE — Therapy (Signed)
[place], [place], [address] Phone: [phone]   Fax:  [phone]  Physical Therapy Treatment  Patient Details  Name: Paul Todd MRN: [id number] Date of Birth: [birth year] Referring Provider: Bonnee Quin PA-C (Surgeon Lavell Anchors, MD)  Encounter Date: 11/23/2015      PT End of Session - 11/23/15 0843    Visit Number 5   Number of Visits 13   Date for PT Re-Evaluation 12/21/15   Authorization Type BCBS    PT Start Time 0845   PT Stop Time 0928   PT Time Calculation (min) 43 min   Activity Tolerance Patient limited by pain   Behavior During Therapy [facility] for tasks assessed/performed      Past Medical History:  Diagnosis Date  . Arthritis       . CAD (coronary artery disease)    multivessel per cath 02/12/13, s/p stenting to RCA  . Chronic lower back pain   . Dyslipidemia   . Hypertension   . Myocardial infarction 02/12/2013   . Nephrolithiasis   . OSA on CPAP   . PE (pulmonary thromboembolism) ([place]) 02/2014  . Persistent atrial fibrillation [facility])         Past Surgical History:  Procedure Laterality Date  . CARDIOVERSION N/A 02/15/2013   Procedure: CARDIOVERSION;  Surgeon: Thayer Headings, MD;  Location: [place];  Service: Cardiovascular;  Laterality: N/A;  . CARDIOVERSION N/A 07/07/2014   Procedure: CARDIOVERSION;  Surgeon: Pixie Casino, MD;  Location: [place];  Service: Cardiovascular;  Laterality: N/A;  . CORONARY ANGIOPLASTY WITH STENT PLACEMENT  02/12/2013  . CYSTOSCOPY WITH RETROGRADE PYELOGRAM, URETEROSCOPY AND STENT PLACEMENT Left 09/02/2013   Procedure: CYSTOSCOPY WITH RETROGRADE PYELOGRAM/LEFT URETEROSCOPY/URETERAL STENT, with fulguration;  Surgeon: Festus Aloe, MD;  [facility];  Service: Urology;  Laterality: Left;  . CYSTOSCOPY WITH URETEROSCOPY AND STENT PLACEMENT Left 09/16/2013   Procedure: CYSTOSCOPY WITH URETEROSCOPY AND STENT PLACEMENT;  Surgeon:  Festus Aloe, MD;  [facility];  Service: Urology;  Laterality: Left;  . HOLMIUM LASER APPLICATION Left [id number]   Procedure: HOLMIUM LASER APPLICATION;  Surgeon: Festus Aloe, MD;  [facility];  Service: Urology;  Laterality: Left;  . LEFT HEART CATHETERIZATION WITH CORONARY ANGIOGRAM N/A 02/12/2013   Procedure: LEFT HEART CATHETERIZATION WITH CORONARY ANGIOGRAM;  Surgeon: Troy Sine, MD;  Location: [facility] CATH LAB;  Service: Cardiovascular;  Laterality: N/A;  . PERCUTANEOUS CORONARY STENT INTERVENTION (PCI-S)  02/12/2013   Procedure: PERCUTANEOUS CORONARY STENT INTERVENTION (PCI-S);  Surgeon: Troy Sine, MD;  Location: [facility] CATH LAB;  Service: Cardiovascular;;  STEMI  . PERCUTANEOUS CORONARY STENT INTERVENTION (PCI-S) N/A 02/14/2013   Procedure: PERCUTANEOUS CORONARY STENT INTERVENTION (PCI-S);  Surgeon: Burnell Blanks, MD;  Location: [facility] CATH LAB;  Service: Cardiovascular;  Laterality: N/A;  Proximal LAD  . TEE WITHOUT CARDIOVERSION N/A 02/15/2013   Procedure: TRANSESOPHAGEAL ECHOCARDIOGRAM (TEE);  Surgeon: Thayer Headings, MD;  Location: [place];  Service: Cardiovascular;  Laterality: N/A;    There were no vitals filed for this visit.      Subjective Assessment - 11/23/15 0843    Subjective Some soreness but better than it was. Can walk about 50 yards before he begins feeling pain in his buttock and calves. Cont to have numbness and burning in bottom of feet. Continues to have pop in back  but not as "hard" as it was before.    Currently in Pain? Yes   Pain Score 1  Pain Location Back   Pain Orientation Lower  always center, moves R and L   Pain Descriptors / Indicators Sore                         [facility] Adult PT Treatment/Exercise - 11/23/15 0001      Lumbar Exercises: Stretches   Active Hamstring Stretch Limitations hooklying bilat   Single Knee to Chest Stretch Limitations paired with opp leg ext   Piriformis Stretch 2 reps;30  seconds  beg and end of session.      Lumbar Exercises: Aerobic   Stationary Bike nu step L5 80min     Lumbar Exercises: Supine   AB Set Limitations pelvic tilts     Lumbar Exercises: Sidelying   Clam 20 reps;10 reps   Clam Limitations bilat     Manual Therapy   Manual Therapy Joint mobilization   Joint Mobilization FA joint AP at end range flx+add, long axis FA joint traction                PT Education - 11/23/15 0847    Education provided Yes   Education Details exercise form/rationale, relationship of hip abd back mobility/spasm, strength of muscle recruitment with radiculopathy.    Person(s) Educated Patient   Methods Explanation;Demonstration;Tactile cues;Verbal cues   Comprehension Verbalized understanding;Returned demonstration;Verbal cues required;Tactile cues required;Need further instruction          PT Short Term Goals - 11/21/15 0923      PT SHORT TERM GOAL #1   Title pt will demonstrate ability to maintain core contraction in supine, seated and standing by 11/17   Status Achieved     PT SHORT TERM GOAL #2   Title Pt will verbalize ability to utilize abdominal contractions and flexion bias exercises to decrease pain with daily activities   Baseline helps to stand up straighter but reports minimal effects on pain.    Status On-going           PT Long Term Goals - 11/07/15 2027      PT LONG TERM GOAL #1   Title Pt will be able to pull a suitcase with minimal discomfort in order to travel for work by 12/15   Baseline unable at eval   Time 6   Period Weeks   Status New     PT LONG TERM GOAL #2   Title Pt will be able to sit in a chair with back support without limitaiton by low back tenderness   Baseline unable at eval, 5 min or less   Time 6   Period Weeks   Status New     PT LONG TERM GOAL #3   Title Pt will demonstrate proper lifting and handling of objects floor<>chest height to complete necessary work activities   Baseline unable at  eval   Time 6   Period Weeks   Status New     PT LONG TERM GOAL #4   Title Pt will verbalize at least 50% improvement in sensation of leg weakness when walking   Baseline feels weak after just a few minutes of walking at eval   Time 6   Period Weeks   Status New               Plan - 11/23/15 0905    Clinical Impression Statement Walking on treadmil for 2.5 min increased concordant pain and cramping in calves, increased pain when attempting to stretch calves; resolved  when laying down. Manual therapy found significant tightness in L post hip capsule. Pt reported numbness in ball of L foot following but denied any cramping or pain at the end of the session.    PT Treatment/Interventions ADLs/Self Care Home Management;Cryotherapy;Electrical Stimulation;Iontophoresis 4mg /ml Dexamethasone;Functional mobility training;Stair training;Gait training;Ultrasound;Traction;Moist Heat;Therapeutic activities;Therapeutic exercise;Balance training;Neuromuscular re-education;Patient/family education;Passive range of motion;Manual techniques;Dry needling;Taping   PT Next Visit Plan effects of hip mobilizations?   PT Home Exercise Plan abdominal sets, hooklying marches, thomas stretch for hip flexors, seated HSS. piriformis stretch   Consulted and Agree with Plan of Care Patient      Patient will benefit from skilled therapeutic intervention in order to improve the following deficits and impairments:     Visit Diagnosis: Acute bilateral low back pain, with sciatica presence unspecified  Difficulty in walking, not elsewhere classified  Other disturbances of skin sensation     Problem List Patient Active Problem List   Diagnosis Date Noted  . Pre-operative cardiovascular examination 08/23/2015  . PVC (premature ventricular contraction) 07/24/2014  . Encounter for cardioversion procedure   . Persistent atrial fibrillation ([place])   . Pulmonary embolus ([place]) 06/20/2014  . Back pain 02/21/2014   . Chest pain 09/05/2013  . Nephrolithiasis 09/05/2013  . Hematuria, gross 09/05/2013  . Nausea and vomiting 09/05/2013  . Nausea & vomiting   . Old myocardial infarction   . Emesis 09/02/2013  . OSA (obstructive sleep apnea) 07/22/2013  . Obesity (BMI 35.0-39.9 without comorbidity) 03/07/2013  . Unstable angina - Post Infarct Angina with existing LAD disease 02/14/2013  . CAD S/P percutaneous coronary angioplasty - DES to Mid LAD; 2x 76mm x 38 mm Xience DES to RCA 02/14/2013  . Dyslipidemia 02/12/2013  . NSTEMI (non-ST elevated myocardial infarction) ([place]) 01/30/2013  . Tobacco abuse 01/30/2013  . Fatigue 01/30/2013  . PAF (paroxysmal atrial fibrillation) ([place]) 01/30/2013   Noam Franzen C. Tavares Levinson PT, DPT 11/23/15 9:32 AM   [facility] [address] [place], [place], [address] Phone: [phone]   Fax:  [phone]  Name: Paul Todd MRN: [id number] Date of Birth: [birth year]

## 2015-11-28 ENCOUNTER — Ambulatory Visit: Payer: BLUE CROSS/BLUE SHIELD | Admitting: Physical Therapy

## 2015-11-28 ENCOUNTER — Encounter: Payer: Self-pay | Admitting: Physical Therapy

## 2015-11-28 DIAGNOSIS — M545 Low back pain: Secondary | ICD-10-CM | POA: Diagnosis not present

## 2015-11-28 DIAGNOSIS — R262 Difficulty in walking, not elsewhere classified: Secondary | ICD-10-CM

## 2015-11-28 DIAGNOSIS — R208 Other disturbances of skin sensation: Secondary | ICD-10-CM

## 2015-11-28 NOTE — Therapy (Signed)
[place], [place], [address] Phone: [phone]   Fax:  [phone]  Physical Therapy Treatment  Patient Details  Name: Paul Todd MRN: [id number] Date of Birth: December 26, 1960 Referring Provider: Bonnee Quin PA-C (Surgeon Lavell Anchors, MD)  Encounter Date: 11/28/2015      PT End of Session - 11/28/15 0856    Visit Number 6   Number of Visits 13   Date for PT Re-Evaluation 12/21/15   Authorization Type BCBS    PT Start Time [phone]  pt arrived late   PT Stop Time 0930   PT Time Calculation (min) 35 min   Activity Tolerance Patient tolerated treatment well   Behavior During Therapy [facility] for tasks assessed/performed      Past Medical History:  Diagnosis Date  . Arthritis       . CAD (coronary artery disease)    multivessel per cath 02/12/13, s/p stenting to RCA  . Chronic lower back pain   . Dyslipidemia   . Hypertension   . Myocardial infarction 02/12/2013   . Nephrolithiasis   . OSA on CPAP   . PE (pulmonary thromboembolism) ([place]) 02/2014  . Persistent atrial fibrillation [facility])         Past Surgical History:  Procedure Laterality Date  . CARDIOVERSION N/A 02/15/2013   Procedure: CARDIOVERSION;  Surgeon: Thayer Headings, MD;  Location: [place];  Service: Cardiovascular;  Laterality: N/A;  . CARDIOVERSION N/A 07/07/2014   Procedure: CARDIOVERSION;  Surgeon: Pixie Casino, MD;  Location: [place];  Service: Cardiovascular;  Laterality: N/A;  . CORONARY ANGIOPLASTY WITH STENT PLACEMENT  02/12/2013  . CYSTOSCOPY WITH RETROGRADE PYELOGRAM, URETEROSCOPY AND STENT PLACEMENT Left 09/02/2013   Procedure: CYSTOSCOPY WITH RETROGRADE PYELOGRAM/LEFT URETEROSCOPY/URETERAL STENT, with fulguration;  Surgeon: Festus Aloe, MD;  [facility];  Service: Urology;  Laterality: Left;  . CYSTOSCOPY WITH URETEROSCOPY AND STENT PLACEMENT Left 09/16/2013   Procedure: CYSTOSCOPY WITH URETEROSCOPY AND  STENT PLACEMENT;  Surgeon: Festus Aloe, MD;  [facility];  Service: Urology;  Laterality: Left;  . HOLMIUM LASER APPLICATION Left [id number]   Procedure: HOLMIUM LASER APPLICATION;  Surgeon: Festus Aloe, MD;  [facility];  Service: Urology;  Laterality: Left;  . LEFT HEART CATHETERIZATION WITH CORONARY ANGIOGRAM N/A 02/12/2013   Procedure: LEFT HEART CATHETERIZATION WITH CORONARY ANGIOGRAM;  Surgeon: Troy Sine, MD;  Location: [facility] CATH LAB;  Service: Cardiovascular;  Laterality: N/A;  . PERCUTANEOUS CORONARY STENT INTERVENTION (PCI-S)  02/12/2013   Procedure: PERCUTANEOUS CORONARY STENT INTERVENTION (PCI-S);  Surgeon: Troy Sine, MD;  Location: [facility] CATH LAB;  Service: Cardiovascular;;  STEMI  . PERCUTANEOUS CORONARY STENT INTERVENTION (PCI-S) N/A 02/14/2013   Procedure: PERCUTANEOUS CORONARY STENT INTERVENTION (PCI-S);  Surgeon: Burnell Blanks, MD;  Location: [facility] CATH LAB;  Service: Cardiovascular;  Laterality: N/A;  Proximal LAD  . TEE WITHOUT CARDIOVERSION N/A 02/15/2013   Procedure: TRANSESOPHAGEAL ECHOCARDIOGRAM (TEE);  Surgeon: Thayer Headings, MD;  Location: [place];  Service: Cardiovascular;  Laterality: N/A;    There were no vitals filed for this visit.      Subjective Assessment - 11/28/15 0853    Subjective Pt reports back is "iffy" today. Toes feel like htey are on fire at times. continues to have cramping. Was very sore over the weekend.    Currently in Pain? Yes   Pain Score 1    Pain Location Back   Pain Orientation Right  [facility] Adult PT Treatment/Exercise - 11/28/15 0001      Lumbar Exercises: Stretches   Active Hamstring Stretch Limitations hooklying bilateral 4x5 ankle pumps   Single Knee to Chest Stretch 20 seconds  both   Quad Stretch Limitations thomas test position for hip flexors     Lumbar Exercises: Aerobic   Stationary Bike 5 min L4     Lumbar Exercises: Supine   AB Set Limitations  hooklying pelvic tilts   Other Supine Lumbar Exercises iso heel slide for hamstring activation   Other Supine Lumbar Exercises hooklying pelvic tilt with ball squeeze at knees x15 5s holds, x10 paired with iso HS curl                PT Education - 11/28/15 0856    Education provided Yes   Education Details exercise form/rationale, POC, HEP, compensatory patterns and system interactions   Person(s) Educated Patient   Methods Explanation;Demonstration;Tactile cues;Verbal cues   Comprehension Verbalized understanding;Returned demonstration;Verbal cues required;Tactile cues required;Need further instruction          PT Short Term Goals - 11/21/15 0923      PT SHORT TERM GOAL #1   Title pt will demonstrate ability to maintain core contraction in supine, seated and standing by 11/17   Status Achieved     PT SHORT TERM GOAL #2   Title Pt will verbalize ability to utilize abdominal contractions and flexion bias exercises to decrease pain with daily activities   Baseline helps to stand up straighter but reports minimal effects on pain.    Status On-going           PT Long Term Goals - 11/28/15 0918      PT LONG TERM GOAL #1   Title Pt will be able to pull a suitcase with minimal discomfort in order to travel for work by 12/15   Baseline has not traveled since beg PT   Status On-going     PT LONG TERM GOAL #2   Title Pt will be able to sit in a chair with back support without limitaiton by low back tenderness   Status Achieved     PT LONG TERM GOAL #3   Title Pt will demonstrate proper lifting and handling of objects floor<>chest height to complete necessary work activities   Status Unable to assess     PT LONG TERM GOAL #4   Title Pt will verbalize at least 50% improvement in sensation of leg weakness when walking   Baseline only able to walk to mailbox on a bad day, 5 min of walking on a good day prior to cramping   Status On-going               Plan -  11/28/15 0932    Clinical Impression Statement Pt has made improvement in that he reports feeling more flexible but has not seen any change in functional ability at this time. Pt continues to have cramping in gluts and calves with burning noted in feet and feeling of cotton shoved into the toe of his shoes. Pt is returning to [place] next week and will determine further care at this point. Pt was educated on benefits of stretching as well as core strength.    Consulted and Agree with Plan of Care Patient      Patient will benefit from skilled therapeutic intervention in order to improve the following deficits and impairments:     Visit Diagnosis: Acute bilateral low back pain, with  sciatica presence unspecified  Difficulty in walking, not elsewhere classified  Other disturbances of skin sensation     Problem List Patient Active Problem List   Diagnosis Date Noted  . Pre-operative cardiovascular examination 08/23/2015  . PVC (premature ventricular contraction) 07/24/2014  . Encounter for cardioversion procedure   . Persistent atrial fibrillation ([place])   . Pulmonary embolus ([place]) 06/20/2014  . Back pain 02/21/2014  . Chest pain 09/05/2013  . Nephrolithiasis 09/05/2013  . Hematuria, gross 09/05/2013  . Nausea and vomiting 09/05/2013  . Nausea & vomiting   . Old myocardial infarction   . Emesis 09/02/2013  . OSA (obstructive sleep apnea) 07/22/2013  . Obesity (BMI 35.0-39.9 without comorbidity) 03/07/2013  . Unstable angina - Post Infarct Angina with existing LAD disease 02/14/2013  . CAD S/P percutaneous coronary angioplasty - DES to Mid LAD; 2x 55mm x 38 mm Xience DES to RCA 02/14/2013  . Dyslipidemia 02/12/2013  . NSTEMI (non-ST elevated myocardial infarction) ([place]) 01/30/2013  . Tobacco abuse 01/30/2013  . Fatigue 01/30/2013  . PAF (paroxysmal atrial fibrillation) ([place]) 01/30/2013    Janard Culp C. Jaimya Feliciano PT, DPT 11/28/15 10:20 AM   [place]  [facility] [address] [place], [place], [address] Phone: [phone]   Fax:  [phone]  Name: Paul Todd MRN: [id number] Date of Birth: 06/17/1960

## 2015-12-05 ENCOUNTER — Ambulatory Visit: Payer: BLUE CROSS/BLUE SHIELD | Admitting: Physical Therapy

## 2015-12-05 DIAGNOSIS — R208 Other disturbances of skin sensation: Secondary | ICD-10-CM

## 2015-12-05 DIAGNOSIS — M545 Low back pain: Secondary | ICD-10-CM

## 2015-12-05 DIAGNOSIS — R262 Difficulty in walking, not elsewhere classified: Secondary | ICD-10-CM

## 2015-12-05 NOTE — Therapy (Signed)
[place], [place], [address] Phone: [phone]   Fax:  [phone]  Physical Therapy Treatment  Patient Details  Name: Paul Todd MRN: [id number] Date of Birth: October 22, 1960 Referring Provider: Bonnee Quin PA-C (Surgeon Lavell Anchors, MD)  Encounter Date: 12/05/2015      PT End of Session - 12/05/15 0934    Visit Number 7   Number of Visits 13   Date for PT Re-Evaluation 12/21/15   Authorization Type BCBS    PT Start Time 0930   PT Stop Time 1010   PT Time Calculation (min) 40 min      Past Medical History:  Diagnosis Date  . Arthritis       . CAD (coronary artery disease)    multivessel per cath 02/12/13, s/p stenting to RCA  . Chronic lower back pain   . Dyslipidemia   . Hypertension   . Myocardial infarction 02/12/2013   . Nephrolithiasis   . OSA on CPAP   . PE (pulmonary thromboembolism) ([place]) 02/2014  . Persistent atrial fibrillation [facility])         Past Surgical History:  Procedure Laterality Date  . CARDIOVERSION N/A 02/15/2013   Procedure: CARDIOVERSION;  Surgeon: Thayer Headings, MD;  Location: [place];  Service: Cardiovascular;  Laterality: N/A;  . CARDIOVERSION N/A 07/07/2014   Procedure: CARDIOVERSION;  Surgeon: Pixie Casino, MD;  Location: [place];  Service: Cardiovascular;  Laterality: N/A;  . CORONARY ANGIOPLASTY WITH STENT PLACEMENT  02/12/2013  . CYSTOSCOPY WITH RETROGRADE PYELOGRAM, URETEROSCOPY AND STENT PLACEMENT Left 09/02/2013   Procedure: CYSTOSCOPY WITH RETROGRADE PYELOGRAM/LEFT URETEROSCOPY/URETERAL STENT, with fulguration;  Surgeon: Festus Aloe, MD;  [facility];  Service: Urology;  Laterality: Left;  . CYSTOSCOPY WITH URETEROSCOPY AND STENT PLACEMENT Left 09/16/2013   Procedure: CYSTOSCOPY WITH URETEROSCOPY AND STENT PLACEMENT;  Surgeon: Festus Aloe, MD;  [facility];  Service: Urology;  Laterality: Left;  . HOLMIUM LASER APPLICATION  Left [id number]   Procedure: HOLMIUM LASER APPLICATION;  Surgeon: Festus Aloe, MD;  [facility];  Service: Urology;  Laterality: Left;  . LEFT HEART CATHETERIZATION WITH CORONARY ANGIOGRAM N/A 02/12/2013   Procedure: LEFT HEART CATHETERIZATION WITH CORONARY ANGIOGRAM;  Surgeon: Troy Sine, MD;  Location: [facility] CATH LAB;  Service: Cardiovascular;  Laterality: N/A;  . PERCUTANEOUS CORONARY STENT INTERVENTION (PCI-S)  02/12/2013   Procedure: PERCUTANEOUS CORONARY STENT INTERVENTION (PCI-S);  Surgeon: Troy Sine, MD;  Location: [facility] CATH LAB;  Service: Cardiovascular;;  STEMI  . PERCUTANEOUS CORONARY STENT INTERVENTION (PCI-S) N/A 02/14/2013   Procedure: PERCUTANEOUS CORONARY STENT INTERVENTION (PCI-S);  Surgeon: Burnell Blanks, MD;  Location: [facility] CATH LAB;  Service: Cardiovascular;  Laterality: N/A;  Proximal LAD  . TEE WITHOUT CARDIOVERSION N/A 02/15/2013   Procedure: TRANSESOPHAGEAL ECHOCARDIOGRAM (TEE);  Surgeon: Thayer Headings, MD;  Location: [place];  Service: Cardiovascular;  Laterality: N/A;    There were no vitals filed for this visit.      Subjective Assessment - 12/05/15 0934    Subjective I feel like I over did it. I used an extension pole to put lights up on the house. I used a small 3 step ladder. It took me hours. It used to take me 15 minutes.   Currently in Pain? Yes   Pain Score 5    Pain Location Back  [place] Adult PT Treatment/Exercise - 12/05/15 0001      Lumbar Exercises: Stretches   Active Hamstring Stretch Limitations hooklying bilateral 4x5 ankle pumps   Single Knee to Chest Stretch 20 seconds  both   Single Knee to Chest Stretch Limitations paired with opp leg ext   Lower Trunk Rotation Limitations x5 each direction   Piriformis Stretch 2 reps;30 seconds  beg and end of session.    Piriformis Stretch Limitations gastroc/soleus stretch slant board     Lumbar Exercises: Aerobic   Stationary Bike L5 min8  minutes      Lumbar Exercises: Supine   AB Set Limitations hooklying pelvic tilts   Clam 10 reps  2 sets with pelvic tilt   Bent Knee Raise 10 reps   Bridge 10 reps   Bridge Limitations beginner bridge, cues for pelvic tilt   Straight Leg Raise 10 reps  both   Straight Leg Raises Limitations straight leg lower/raise from 90/90   Other Supine Lumbar Exercises ab set with iso hamstring curl over physioball     Lumbar Exercises: Sidelying   Clam 20 reps   Hip Abduction 10 reps  both                  PT Short Term Goals - 11/21/15 [id number]      PT SHORT TERM GOAL #1   Title pt will demonstrate ability to maintain core contraction in supine, seated and standing by 11/17   Status Achieved     PT SHORT TERM GOAL #2   Title Pt will verbalize ability to utilize abdominal contractions and flexion bias exercises to decrease pain with daily activities   Baseline helps to stand up straighter but reports minimal effects on pain.    Status On-going           PT Long Term Goals - 12/05/15 0941      PT LONG TERM GOAL #1   Title Pt will be able to pull a suitcase with minimal discomfort in order to travel for work by 12/15   Baseline has not traveled since beg PT   Time 6   Period Weeks   Status On-going     PT LONG TERM GOAL #2   Title Pt will be able to sit in a chair with back support without limitaiton by low back tenderness   Time 6   Period Weeks   Status Achieved     PT LONG TERM GOAL #3   Title Pt will demonstrate proper lifting and handling of objects floor<>chest height to complete necessary work activities   Time 6   Period Weeks   Status Unable to assess     PT LONG TERM GOAL #4   Title Pt will verbalize at least 50% improvement in sensation of leg weakness when walking   Baseline 5-10 minutes prior to onset of cramping in glutes and calves   Time 6   Period Weeks   Status On-going               Plan - 12/05/15 0936    Clinical Impression  Statement Able to put up Christmas Lights. Took many rest breaks. Increased pain after 30 minutes. Burning in feet at the end of the day. Saw neurosurgeon for follow up. She recommended he stay in PT for awhile and then transition to exercise program. She thought his symptoms (cramping, burning in feet) would take one year to go away. He has not been released to  retrun to work and has no indication of when that may be. Left foot began burning at end of session during piriformis stretch. Long axis distraction to left hip decreased burning sensation.    PT Next Visit Plan continue flexion bias stabilization, stretching   PT Home Exercise Plan abdominal sets, hooklying marches, thomas stretch for hip flexors, seated HSS. piriformis stretch   Consulted and Agree with Plan of Care Patient      Patient will benefit from skilled therapeutic intervention in order to improve the following deficits and impairments:  Pain, Increased muscle spasms, Decreased activity tolerance, Decreased balance, Improper body mechanics, Postural dysfunction, Impaired sensation, Decreased strength  Visit Diagnosis: Acute bilateral low back pain, with sciatica presence unspecified  Difficulty in walking, not elsewhere classified  Other disturbances of skin sensation     Problem List Patient Active Problem List   Diagnosis Date Noted  . Pre-operative cardiovascular examination 08/23/2015  . PVC (premature ventricular contraction) 07/24/2014  . Encounter for cardioversion procedure   . Persistent atrial fibrillation ([place])   . Pulmonary embolus ([place]) 06/20/2014  . Back pain 02/21/2014  . Chest pain 09/05/2013  . Nephrolithiasis 09/05/2013  . Hematuria, gross 09/05/2013  . Nausea and vomiting 09/05/2013  . Nausea & vomiting   . Old myocardial infarction   . Emesis 09/02/2013  . OSA (obstructive sleep apnea) 07/22/2013  . Obesity (BMI 35.0-39.9 without comorbidity) 03/07/2013  . Unstable angina - Post Infarct  Angina with existing LAD disease 02/14/2013  . CAD S/P percutaneous coronary angioplasty - DES to Mid LAD; 2x 61mm x 38 mm Xience DES to RCA 02/14/2013  . Dyslipidemia 02/12/2013  . NSTEMI (non-ST elevated myocardial infarction) ([place]) 01/30/2013  . Tobacco abuse 01/30/2013  . Fatigue 01/30/2013  . PAF (paroxysmal atrial fibrillation) ([place]) 01/30/2013    Dorene Ar, PTA 12/05/2015, 10:28 AM  [place], [place], [address] Phone: [phone]   Fax:  [phone]  Name: Paul Todd MRN: [id number] Date of Birth: [birth year]

## 2015-12-07 ENCOUNTER — Ambulatory Visit: Payer: BLUE CROSS/BLUE SHIELD | Attending: Neurosurgery | Admitting: Physical Therapy

## 2015-12-07 DIAGNOSIS — R208 Other disturbances of skin sensation: Secondary | ICD-10-CM | POA: Diagnosis present

## 2015-12-07 DIAGNOSIS — M545 Low back pain: Secondary | ICD-10-CM | POA: Insufficient documentation

## 2015-12-07 DIAGNOSIS — R262 Difficulty in walking, not elsewhere classified: Secondary | ICD-10-CM | POA: Diagnosis present

## 2015-12-07 NOTE — Therapy (Signed)
[place], [place], [address] Phone: [phone]   Fax:  [phone]  Physical Therapy Treatment  Patient Details  Name: Paul Todd MRN: [id number] Date of Birth: 01-04-1961 Referring Provider: Bonnee Quin PA-C (Surgeon Lavell Anchors, MD)  Encounter Date: 12/07/2015      PT End of Session - 12/07/15 0859    Visit Number 8   Number of Visits 13   Date for PT Re-Evaluation 12/21/15   Authorization Type BCBS    PT Start Time 0846   PT Stop Time 0929   PT Time Calculation (min) 43 min      Past Medical History:  Diagnosis Date  . Arthritis       . CAD (coronary artery disease)    multivessel per cath 02/12/13, s/p stenting to RCA  . Chronic lower back pain   . Dyslipidemia   . Hypertension   . Myocardial infarction 02/12/2013   . Nephrolithiasis   . OSA on CPAP   . PE (pulmonary thromboembolism) ([place]) 02/2014  . Persistent atrial fibrillation [facility])         Past Surgical History:  Procedure Laterality Date  . CARDIOVERSION N/A 02/15/2013   Procedure: CARDIOVERSION;  Surgeon: Thayer Headings, MD;  Location: [place];  Service: Cardiovascular;  Laterality: N/A;  . CARDIOVERSION N/A 07/07/2014   Procedure: CARDIOVERSION;  Surgeon: Pixie Casino, MD;  Location: [place];  Service: Cardiovascular;  Laterality: N/A;  . CORONARY ANGIOPLASTY WITH STENT PLACEMENT  02/12/2013  . CYSTOSCOPY WITH RETROGRADE PYELOGRAM, URETEROSCOPY AND STENT PLACEMENT Left 09/02/2013   Procedure: CYSTOSCOPY WITH RETROGRADE PYELOGRAM/LEFT URETEROSCOPY/URETERAL STENT, with fulguration;  Surgeon: Festus Aloe, MD;  [facility];  Service: Urology;  Laterality: Left;  . CYSTOSCOPY WITH URETEROSCOPY AND STENT PLACEMENT Left 09/16/2013   Procedure: CYSTOSCOPY WITH URETEROSCOPY AND STENT PLACEMENT;  Surgeon: Festus Aloe, MD;  [facility];  Service: Urology;  Laterality: Left;  . HOLMIUM LASER APPLICATION  Left [id number]   Procedure: HOLMIUM LASER APPLICATION;  Surgeon: Festus Aloe, MD;  [facility];  Service: Urology;  Laterality: Left;  . LEFT HEART CATHETERIZATION WITH CORONARY ANGIOGRAM N/A 02/12/2013   Procedure: LEFT HEART CATHETERIZATION WITH CORONARY ANGIOGRAM;  Surgeon: Troy Sine, MD;  Location: [facility] CATH LAB;  Service: Cardiovascular;  Laterality: N/A;  . PERCUTANEOUS CORONARY STENT INTERVENTION (PCI-S)  02/12/2013   Procedure: PERCUTANEOUS CORONARY STENT INTERVENTION (PCI-S);  Surgeon: Troy Sine, MD;  Location: [facility] CATH LAB;  Service: Cardiovascular;;  STEMI  . PERCUTANEOUS CORONARY STENT INTERVENTION (PCI-S) N/A 02/14/2013   Procedure: PERCUTANEOUS CORONARY STENT INTERVENTION (PCI-S);  Surgeon: Burnell Blanks, MD;  Location: [facility] CATH LAB;  Service: Cardiovascular;  Laterality: N/A;  Proximal LAD  . TEE WITHOUT CARDIOVERSION N/A 02/15/2013   Procedure: TRANSESOPHAGEAL ECHOCARDIOGRAM (TEE);  Surgeon: Thayer Headings, MD;  Location: [place];  Service: Cardiovascular;  Laterality: N/A;    There were no vitals filed for this visit.      Subjective Assessment - 12/07/15 0903    Subjective About the same    Currently in Pain? Yes   Pain Score 3    Pain Location Back   Pain Descriptors / Indicators --  stiffness   Pain Radiating Towards bilateral LE    Aggravating Factors  over doing it, up on feet too long    Pain Relieving Factors elevating feet  [place] Adult PT Treatment/Exercise - 12/07/15 0001      Lumbar Exercises: Stretches   Single Knee to Chest Stretch 20 seconds  both   Single Knee to Chest Stretch Limitations paired with opp leg ext   Lower Trunk Rotation Limitations x5 each direction   Piriformis Stretch 2 reps;30 seconds  beg and end of session.      Lumbar Exercises: Aerobic   Stationary Bike L6 x 5 minutes      Lumbar Exercises: Standing   Other Standing Lumbar Exercises Pelvic tilt at wll  -discussed standing posture     Lumbar Exercises: Supine   AB Set Limitations hooklying pelvic tilts   Clam 10 reps  2 sets with pelvic tilt and red band    Bent Knee Raise 10 reps   Bent Knee Raise Limitations attempting level 2, pt felt pop in low back, some mild increase inpain so disc.    Bridge 10 Forensic scientist bridge, cues for pelvic tilt   Straight Leg Raise 10 reps  both, 2 sets   Straight Leg Raises Limitations straight leg lower/raise from 90/90   Other Supine Lumbar Exercises ab set with iso hamstring curl over physioball     Lumbar Exercises: Sidelying   Clam 20 reps  red band    Hip Abduction 10 reps  both, 2 sets                   PT Short Term Goals - 11/21/15 [id number]      PT SHORT TERM GOAL #1   Title pt will demonstrate ability to maintain core contraction in supine, seated and standing by 11/17   Status Achieved     PT SHORT TERM GOAL #2   Title Pt will verbalize ability to utilize abdominal contractions and flexion bias exercises to decrease pain with daily activities   Baseline helps to stand up straighter but reports minimal effects on pain.    Status On-going           PT Long Term Goals - 12/05/15 0941      PT LONG TERM GOAL #1   Title Pt will be able to pull a suitcase with minimal discomfort in order to travel for work by 12/15   Baseline has not traveled since beg PT   Time 6   Period Weeks   Status On-going     PT LONG TERM GOAL #2   Title Pt will be able to sit in a chair with back support without limitaiton by low back tenderness   Time 6   Period Weeks   Status Achieved     PT LONG TERM GOAL #3   Title Pt will demonstrate proper lifting and handling of objects floor<>chest height to complete necessary work activities   Time 6   Period Weeks   Status Unable to assess     PT LONG TERM GOAL #4   Title Pt will verbalize at least 50% improvement in sensation of leg weakness when walking   Baseline 5-10  minutes prior to onset of cramping in glutes and calves   Time 6   Period Weeks   Status On-going               Plan - 12/07/15 0854    Clinical Impression Statement Still like feet are on fire everydayby 2-3pm. Sit and elevate and stretch seems to help a little. He plans to call MD by end of the month  if continued radicular sx. Attempted to advance lower abdominal core with pt reporting popping and some mild increase in pain. Pain eased with return to basic core.    PT Next Visit Plan continue flexion bias stabilization; advance HEP, stretching   PT Home Exercise Plan abdominal sets, hooklying marches, thomas stretch for hip flexors, seated HSS. piriformis stretch   Consulted and Agree with Plan of Care Patient      Patient will benefit from skilled therapeutic intervention in order to improve the following deficits and impairments:  Pain, Increased muscle spasms, Decreased activity tolerance, Decreased balance, Improper body mechanics, Postural dysfunction, Impaired sensation, Decreased strength  Visit Diagnosis: Acute bilateral low back pain, with sciatica presence unspecified  Difficulty in walking, not elsewhere classified  Other disturbances of skin sensation     Problem List Patient Active Problem List   Diagnosis Date Noted  . Pre-operative cardiovascular examination 08/23/2015  . PVC (premature ventricular contraction) 07/24/2014  . Encounter for cardioversion procedure   . Persistent atrial fibrillation ([place])   . Pulmonary embolus ([place]) 06/20/2014  . Back pain 02/21/2014  . Chest pain 09/05/2013  . Nephrolithiasis 09/05/2013  . Hematuria, gross 09/05/2013  . Nausea and vomiting 09/05/2013  . Nausea & vomiting   . Old myocardial infarction   . Emesis 09/02/2013  . OSA (obstructive sleep apnea) 07/22/2013  . Obesity (BMI 35.0-39.9 without comorbidity) 03/07/2013  . Unstable angina - Post Infarct Angina with existing LAD disease 02/14/2013  . CAD S/P  percutaneous coronary angioplasty - DES to Mid LAD; 2x 27mm x 38 mm Xience DES to RCA 02/14/2013  . Dyslipidemia 02/12/2013  . NSTEMI (non-ST elevated myocardial infarction) ([place]) 01/30/2013  . Tobacco abuse 01/30/2013  . Fatigue 01/30/2013  . PAF (paroxysmal atrial fibrillation) ([place]) 01/30/2013    Dorene Ar, PTA 12/07/2015, 9:30 AM  [place], [place], [address] Phone: [phone]   Fax:  [phone]  Name: KAELIN DEMAN MRN: [id number] Date of Birth: 07-31-1960

## 2015-12-12 ENCOUNTER — Ambulatory Visit: Payer: BLUE CROSS/BLUE SHIELD | Admitting: Physical Therapy

## 2015-12-12 DIAGNOSIS — R208 Other disturbances of skin sensation: Secondary | ICD-10-CM

## 2015-12-12 DIAGNOSIS — M545 Low back pain: Secondary | ICD-10-CM | POA: Diagnosis not present

## 2015-12-12 DIAGNOSIS — R262 Difficulty in walking, not elsewhere classified: Secondary | ICD-10-CM

## 2015-12-12 NOTE — Therapy (Signed)
[place], [place], [address] Phone: [phone]   Fax:  [phone]  Physical Therapy Treatment  Patient Details  Name: Paul Todd MRN: [id number] Date of Birth: 1960-09-24 Referring Provider: Bonnee Quin PA-C (Surgeon Lavell Anchors, MD)  Encounter Date: 12/12/2015      PT End of Session - 12/12/15 0847    Visit Number 9   Number of Visits 13   Date for PT Re-Evaluation 12/21/15   Authorization Type BCBS    PT Start Time 0845   PT Stop Time 0930   PT Time Calculation (min) 45 min      Past Medical History:  Diagnosis Date  . Arthritis       . CAD (coronary artery disease)    multivessel per cath 02/12/13, s/p stenting to RCA  . Chronic lower back pain   . Dyslipidemia   . Hypertension   . Myocardial infarction 02/12/2013   . Nephrolithiasis   . OSA on CPAP   . PE (pulmonary thromboembolism) ([place]) 02/2014  . Persistent atrial fibrillation [facility])         Past Surgical History:  Procedure Laterality Date  . CARDIOVERSION N/A 02/15/2013   Procedure: CARDIOVERSION;  Surgeon: Thayer Headings, MD;  Location: [place];  Service: Cardiovascular;  Laterality: N/A;  . CARDIOVERSION N/A 07/07/2014   Procedure: CARDIOVERSION;  Surgeon: Pixie Casino, MD;  Location: [place];  Service: Cardiovascular;  Laterality: N/A;  . CORONARY ANGIOPLASTY WITH STENT PLACEMENT  02/12/2013  . CYSTOSCOPY WITH RETROGRADE PYELOGRAM, URETEROSCOPY AND STENT PLACEMENT Left 09/02/2013   Procedure: CYSTOSCOPY WITH RETROGRADE PYELOGRAM/LEFT URETEROSCOPY/URETERAL STENT, with fulguration;  Surgeon: Festus Aloe, MD;  [facility];  Service: Urology;  Laterality: Left;  . CYSTOSCOPY WITH URETEROSCOPY AND STENT PLACEMENT Left 09/16/2013   Procedure: CYSTOSCOPY WITH URETEROSCOPY AND STENT PLACEMENT;  Surgeon: Festus Aloe, MD;  [facility];  Service: Urology;  Laterality: Left;  . HOLMIUM LASER APPLICATION  Left 8/86/4847   Procedure: HOLMIUM LASER APPLICATION;  Surgeon: Festus Aloe, MD;  [facility];  Service: Urology;  Laterality: Left;  . LEFT HEART CATHETERIZATION WITH CORONARY ANGIOGRAM N/A 02/12/2013   Procedure: LEFT HEART CATHETERIZATION WITH CORONARY ANGIOGRAM;  Surgeon: Troy Sine, MD;  Location[facility] C[facility] LAB;  Service: Cardiovascular;  Laterality: N/A;  . PERCUTANEOUS CORONARY STENT INTERVENTION (PCI-S)  02/12/2013   Procedure: PERCUTANEOUS CORONARY STENT INTERVENTION (PCI-S);  Surgeon: Troy Sine, MD;  Location: [facility] CATH LAB;  Service: Cardiovascular;;  STEMI  . PERCUTANEOUS CORONARY STENT INTERVENTION (PCI-S) N/A 02/14/2013   Procedure: PERCUTANEOUS CORONARY STENT INTERVENTION (PCI-S);  Surgeon: Burnell Blanks, MD;  Location: [facility] CATH LAB;  Service: Cardiovascular;  Laterality: N/A;  Proximal LAD  . TEE WITHOUT CARDIOVERSION N/A 02/15/2013   Procedure: TRANSESOPHAGEAL ECHOCARDIOGRAM (TEE);  Surgeon: Thayer Headings, MD;  Location: [place];  Service: Cardiovascular;  Laterality: N/A;    There were no vitals filed for this visit.      Subjective Assessment - 12/12/15 0856    Subjective Yesterday was the first day I made it 45 minutes without cramping in legs   Currently in Pain? Yes   Pain Score 2    Pain Location Back   Pain Orientation Mid;Lower   Pain Descriptors / Indicators Tightness                         [facility] Adult PT Treatment/Exercise - 12/12/15 0001  Lumbar Exercises: Public affairs consultant Limitations edge of mat hip flexor stretch 1 minute each     Lumbar Exercises: Aerobic   Stationary Bike nustep L5 x 6 minutes  UE at #9      Lumbar Exercises: Supine   AB Set Limitations hooklying pelvic tilts   Clam 10 reps  2 sets with pelvic tilt and green band    Bent Knee Raise 10 reps   Bent Knee Raise Limitations attempting level 2, pt felt pop in low back, some mild increase inpain so disc.    Straight Leg Raise 20 reps   both, 2 sets   Other Supine Lumbar Exercises hooklying pelvic tilt with ball squeeze at knees x15 5s holds, x10 paired with iso HS curl  too difficult, has not perfromed per HEP     Lumbar Exercises: Sidelying   Clam 15 reps  green   Hip Abduction 15 reps                PT Education - 12/12/15 0918    Education provided Yes   Education Details HEP   Person(s) Educated Patient   Methods Explanation;Handout   Comprehension Verbalized understanding          PT Short Term Goals - 11/21/15 0923      PT SHORT TERM GOAL #1   Title pt will demonstrate ability to maintain core contraction in supine, seated and standing by 11/17   Status Achieved     PT SHORT TERM GOAL #2   Title Pt will verbalize ability to utilize abdominal contractions and flexion bias exercises to decrease pain with daily activities   Baseline helps to stand up straighter but reports minimal effects on pain.    Status On-going           PT Long Term Goals - 12/12/15 0919      PT LONG TERM GOAL #1   Title Pt will be able to pull a suitcase with minimal discomfort in order to travel for work by 12/15   Baseline has not traveled since beg PT   Time 6   Period Weeks   Status On-going     PT LONG TERM GOAL #2   Title Pt will be able to sit in a chair with back support without limitaiton by low back tenderness   Baseline unable at eval, 5 min or less   Time 6   Period Weeks   Status Achieved     PT LONG TERM GOAL #3   Title Pt will demonstrate proper lifting and handling of objects floor<>chest height to complete necessary work activities   Baseline unable at eval   Time 6   Period Weeks   Status Unable to assess     PT LONG TERM GOAL #4   Title Pt will verbalize at least 50% improvement in sensation of leg weakness when walking   Baseline 45 minutes reproted yesterday, not yet consistent    Time 6   Period Weeks   Status Partially Met               Plan - 12/12/15 0850     Clinical Impression Statement Pt reports increased walking tolerance without cramps. Also less burning and tingling in feet by the end of the day. LTG# 4 partially met. Added core and lateral hip strength to HEP.    PT Next Visit Plan continue flexion bias stabilization; advance HEP, stretching   PT Home Exercise Plan abdominal  sets, hooklying marches, thomas stretch for hip flexors, seated HSS. piriformis stretch; table top one leg at a time. side clam with green band, side leg lift    Consulted and Agree with Plan of Care Patient      Patient will benefit from skilled therapeutic intervention in order to improve the following deficits and impairments:  Pain, Increased muscle spasms, Decreased activity tolerance, Decreased balance, Improper body mechanics, Postural dysfunction, Impaired sensation, Decreased strength  Visit Diagnosis: Acute bilateral low back pain, with sciatica presence unspecified  Difficulty in walking, not elsewhere classified  Other disturbances of skin sensation     Problem List Patient Active Problem List   Diagnosis Date Noted  . Pre-operative cardiovascular examination 08/23/2015  . PVC (premature ventricular contraction) 07/24/2014  . Encounter for cardioversion procedure   . Persistent atrial fibrillation ([place])   . Pulmonary embolus ([place]) 06/20/2014  . Back pain 02/21/2014  . Chest pain 09/05/2013  . Nephrolithiasis 09/05/2013  . Hematuria, gross 09/05/2013  . Nausea and vomiting 09/05/2013  . Nausea & vomiting   . Old myocardial infarction   . Emesis 09/02/2013  . OSA (obstructive sleep apnea) 07/22/2013  . Obesity (BMI 35.0-39.9 without comorbidity) 03/07/2013  . Unstable angina - Post Infarct Angina with existing LAD disease 02/14/2013  . CAD S/P percutaneous coronary angioplasty - DES to Mid LAD; 2x 82m x 38 mm Xience DES to RCA 02/14/2013  . Dyslipidemia 02/12/2013  . NSTEMI (non-ST elevated myocardial infarction) ([place] 01/30/2013  . Tobacco  abuse 01/30/2013  . Fatigue 01/30/2013  . PAF (paroxysmal atrial fibrillation) ([place] 01/30/2013    DDorene Ar PTA 12/12/2015, 9:40 AM  [place] [address]Phone: [phone]  Fax:  [phone] Name: Paul SITZMANNMRN: [id number] of Birth: [birth year]

## 2015-12-14 ENCOUNTER — Ambulatory Visit: Payer: BLUE CROSS/BLUE SHIELD | Admitting: Physical Therapy

## 2015-12-14 ENCOUNTER — Encounter: Payer: Self-pay | Admitting: Physical Therapy

## 2015-12-14 DIAGNOSIS — R208 Other disturbances of skin sensation: Secondary | ICD-10-CM

## 2015-12-14 DIAGNOSIS — M545 Low back pain: Secondary | ICD-10-CM

## 2015-12-14 DIAGNOSIS — R262 Difficulty in walking, not elsewhere classified: Secondary | ICD-10-CM

## 2015-12-14 NOTE — Therapy (Signed)
[place], [place], [address] Phone: [phone]   Fax:  [phone]  Physical Therapy Treatment  Patient Details  Name: Paul Todd MRN: [id number] Date of Birth: August 22, 1960 Referring Provider: Bonnee Quin PA-C (Surgeon Lavell Anchors, MD)  Encounter Date: 12/14/2015      PT End of Session - 12/14/15 0856    Visit Number 10   Number of Visits 13   Date for PT Re-Evaluation 12/21/15   Authorization Type BCBS    PT Start Time 0852   PT Stop Time 0929   PT Time Calculation (min) 37 min   Activity Tolerance Patient tolerated treatment well   Behavior During Therapy [facility] for tasks assessed/performed      Past Medical History:  Diagnosis Date  . Arthritis       . CAD (coronary artery disease)    multivessel per cath 02/12/13, s/p stenting to RCA  . Chronic lower back pain   . Dyslipidemia   . Hypertension   . Myocardial infarction 02/12/2013   . Nephrolithiasis   . OSA on CPAP   . PE (pulmonary thromboembolism) ([place]) 02/2014  . Persistent atrial fibrillation [facility])         Past Surgical History:  Procedure Laterality Date  . CARDIOVERSION N/A 02/15/2013   Procedure: CARDIOVERSION;  Surgeon: Thayer Headings, MD;  Location: [place];  Service: Cardiovascular;  Laterality: N/A;  . CARDIOVERSION N/A 07/07/2014   Procedure: CARDIOVERSION;  Surgeon: Pixie Casino, MD;  Location: [place];  Service: Cardiovascular;  Laterality: N/A;  . CORONARY ANGIOPLASTY WITH STENT PLACEMENT  02/12/2013  . CYSTOSCOPY WITH RETROGRADE PYELOGRAM, URETEROSCOPY AND STENT PLACEMENT Left 09/02/2013   Procedure: CYSTOSCOPY WITH RETROGRADE PYELOGRAM/LEFT URETEROSCOPY/URETERAL STENT, with fulguration;  Surgeon: Festus Aloe, MD;  [facility];  Service: Urology;  Laterality: Left;  . CYSTOSCOPY WITH URETEROSCOPY AND STENT PLACEMENT Left 09/16/2013   Procedure: CYSTOSCOPY WITH URETEROSCOPY AND STENT PLACEMENT;   Surgeon: Festus Aloe, MD;  [facility];  Service: Urology;  Laterality: Left;  . HOLMIUM LASER APPLICATION Left 9/50/9326   Procedure: HOLMIUM LASER APPLICATION;  Surgeon: Festus Aloe, MD;  [facility];  Service: Urology;  Laterality: Left;  . LEFT HEART CATHETERIZATION WITH CORONARY ANGIOGRAM N/A 02/12/2013   Procedure: LEFT HEART CATHETERIZATION WITH CORONARY ANGIOGRAM;  Surgeon: Troy Sine, MD;  Location: [facility] CATH LAB;  Service: Cardiovascular;  Laterality: N/A;  . PERCUTANEOUS CORONARY STENT INTERVENTION (PCI-S)  02/12/2013   Procedure: PERCUTANEOUS CORONARY STENT INTERVENTION (PCI-S);  Surgeon: Troy Sine, MD;  Location: [facility] C[facility] LAB;  Service: Cardiovascular;;  STEMI  . PERCUTANEOUS CORONARY STENT INTERVENTION (PCI-S) N/A 02/14/2013   Procedure: PERCUTANEOUS CORONARY STENT INTERVENTION (PCI-S);  Surgeon: Burnell Blanks, MD;  Location: [facility] CATH LAB;  Service: Cardiovascular;  Laterality: N/A;  Proximal LAD  . TEE WITHOUT CARDIOVERSION N/A 02/15/2013   Procedure: TRANSESOPHAGEAL ECHOCARDIOGRAM (TEE);  Surgeon: Thayer Headings, MD;  Location: [place];  Service: Cardiovascular;  Laterality: N/A;    There were no vitals filed for this visit.      Subjective Assessment - 12/14/15 0855    Subjective Legs are feeling better, biggest complaint is fatigue in low back.    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 Yes   Pain Score 2    Pain Location Back   Pain Orientation Lower   Pain Descriptors / Indicators Aching  [place] Adult PT Treatment/Exercise - 12/14/15 0001      Lumbar Exercises: Stretches   Single Knee to Chest Stretch 20 seconds   Single Knee to Chest Stretch Limitations paired with opp leg ext   Lower Trunk Rotation Limitations x5 each direction   Quad Stretch Limitations edge of mat hip flexor stretch 1 minute each    Piriformis Stretch 2 reps;30 seconds  beg and end of session   Piriformis Stretch Limitations slant board slant board     Lumbar Exercises: Aerobic   Stationary Bike bike 10 min L3     Lumbar Exercises: Supine   AB Set Limitations hooklying pelvic tilts   Bent Knee Raise 20 reps   Bridge 20 reps   Bridge Limitations ball bw knees, in DF   Other Supine Lumbar Exercises hooklying ball squeeze with pelvic tilt, in DF     Lumbar Exercises: Sidelying   Clam 20 reps   Clam Limitations both, green   Hip Abduction 20 reps   Hip Abduction Weights (lbs) both                PT Education - 12/14/15 0856    Education provided Yes   Education Details exercise form/rationale, HEP   Person(s) Educated Patient   Methods Explanation;Demonstration;Tactile cues;Verbal cues   Comprehension Verbalized understanding;Returned demonstration;Verbal cues required;Tactile cues required;Need further instruction          PT Short Term Goals - 11/21/15 0923      PT SHORT TERM GOAL #1   Title pt will demonstrate ability to maintain core contraction in supine, seated and standing by 11/17   Status Achieved     PT SHORT TERM GOAL #2   Title Pt will verbalize ability to utilize abdominal contractions and flexion bias exercises to decrease pain with daily activities   Baseline helps to stand up straighter but reports minimal effects on pain.    Status On-going           PT Long Term Goals - 12/12/15 0919      PT LONG TERM GOAL #1   Title Pt will be able to pull a suitcase with minimal discomfort in order to travel for work by 12/15   Baseline has not traveled since beg PT   Time 6   Period Weeks   Status On-going     PT LONG TERM GOAL #2   Title Pt will be able to sit in a chair with back support without limitaiton by low back tenderness   Baseline unable at eval, 5 min or less   Time 6   Period Weeks   Status Achieved     PT LONG TERM GOAL #3   Title Pt will demonstrate proper  lifting and handling of objects floor<>chest height to complete necessary work activities   Baseline unable at eval   Time 6   Period Weeks   Status Unable to assess     PT LONG TERM GOAL #4   Title Pt will verbalize at least 50% improvement in sensation of leg weakness when walking   Baseline 45 minutes reproted yesterday, not yet consistent    Time 6   Period Weeks   Status Partially Met               Plan - 12/14/15 0920    Clinical Impression Statement Fatigue with exercises and able to utilize appropriate musculature. Will begin transition to independent, long-term program as we near end of POC.  PT Next Visit Plan flexion bias with core stabilization, gym program   PT Home Exercise Plan abdominal sets, hooklying marches, thomas stretch for hip flexors, seated HSS. piriformis stretch; table top one leg at a time. side clam with green band, side leg lift    Consulted and Agree with Plan of Care Patient      Patient will benefit from skilled therapeutic intervention in order to improve the following deficits and impairments:     Visit Diagnosis: Acute bilateral low back pain, with sciatica presence unspecified  Difficulty in walking, not elsewhere classified  Other disturbances of skin sensation     Problem List Patient Active Problem List   Diagnosis Date Noted  . Pre-operative cardiovascular examination 08/23/2015  . PVC (premature ventricular contraction) 07/24/2014  . Encounter for cardioversion procedure   . Persistent atrial fibrillation ([place])   . Pulmonary embolus ([place]) 06/20/2014  . Back pain 02/21/2014  . Chest pain 09/05/2013  . Nephrolithiasis 09/05/2013  . Hematuria, gross 09/05/2013  . Nausea and vomiting 09/05/2013  . Nausea & vomiting   . Old myocardial infarction   . Emesis 09/02/2013  . OSA (obstructive sleep apnea) 07/22/2013  . Obesity (BMI 35.0-39.9 without comorbidity) 03/07/2013  . Unstable angina - Post Infarct Angina with  existing LAD disease 02/14/2013  . CAD S/P percutaneous coronary angioplasty - DES to Mid LAD; 2x 30m x 38 mm Xience DES to RCA 02/14/2013  . Dyslipidemia 02/12/2013  . NSTEMI (non-ST elevated myocardial infarction) ([place] 01/30/2013  . Tobacco abuse 01/30/2013  . Fatigue 01/30/2013  . PAF (paroxysmal atrial fibrillation) ([place] 01/30/2013    Ashok Sawaya C. Laylah Riga PT, DPT 12/14/15 11:53 AM   [facility] [place] [address]Phone: [phone]  Fax:  [phone] Name: Paul HUEBERTMRN: [id number] of Birth: 8March 29, 1962

## 2015-12-19 ENCOUNTER — Ambulatory Visit: Payer: BLUE CROSS/BLUE SHIELD | Admitting: Physical Therapy

## 2015-12-19 DIAGNOSIS — M545 Low back pain: Secondary | ICD-10-CM

## 2015-12-19 DIAGNOSIS — R262 Difficulty in walking, not elsewhere classified: Secondary | ICD-10-CM

## 2015-12-19 DIAGNOSIS — R208 Other disturbances of skin sensation: Secondary | ICD-10-CM

## 2015-12-19 NOTE — Therapy (Signed)
[place], [place], [address] Phone: [phone]   Fax:  [phone]  Physical Therapy Treatment  Patient Details  Name: Paul Todd MRN: [id number] Date of Birth: 02/03/1960 Referring Provider: Bonnee Quin PA-C (Surgeon Lavell Anchors, MD)  Encounter Date: 12/19/2015      PT End of Session - 12/19/15 0851    Visit Number 11   Number of Visits 13   Date for PT Re-Evaluation 12/21/15   Authorization Type BCBS    PT Start Time 0844   PT Stop Time 0930   PT Time Calculation (min) 46 min      Past Medical History:  Diagnosis Date  . Arthritis       . CAD (coronary artery disease)    multivessel per cath 02/12/13, s/p stenting to RCA  . Chronic lower back pain   . Dyslipidemia   . Hypertension   . Myocardial infarction 02/12/2013   . Nephrolithiasis   . OSA on CPAP   . PE (pulmonary thromboembolism) ([place]) 02/2014  . Persistent atrial fibrillation [facility])         Past Surgical History:  Procedure Laterality Date  . CARDIOVERSION N/A 02/15/2013   Procedure: CARDIOVERSION;  Surgeon: Thayer Headings, MD;  Location: [place];  Service: Cardiovascular;  Laterality: N/A;  . CARDIOVERSION N/A 07/07/2014   Procedure: CARDIOVERSION;  Surgeon: Pixie Casino, MD;  Location: [place];  Service: Cardiovascular;  Laterality: N/A;  . CORONARY ANGIOPLASTY WITH STENT PLACEMENT  02/12/2013  . CYSTOSCOPY WITH RETROGRADE PYELOGRAM, URETEROSCOPY AND STENT PLACEMENT Left 09/02/2013   Procedure: CYSTOSCOPY WITH RETROGRADE PYELOGRAM/LEFT URETEROSCOPY/URETERAL STENT, with fulguration;  Surgeon: Festus Aloe, MD;  [facility];  Service: Urology;  Laterality: Left;  . CYSTOSCOPY WITH URETEROSCOPY AND STENT PLACEMENT Left 09/16/2013   Procedure: CYSTOSCOPY WITH URETEROSCOPY AND STENT PLACEMENT;  Surgeon: Festus Aloe, MD;  [facility];  Service: Urology;  Laterality: Left;  . HOLMIUM LASER APPLICATION  Left 6/96/2952   Procedure: HOLMIUM LASER APPLICATION;  Surgeon: Festus Aloe, MD;  [facility];  Service: Urology;  Laterality: Left;  . LEFT HEART CATHETERIZATION WITH CORONARY ANGIOGRAM N/A 02/12/2013   Procedure: LEFT HEART CATHETERIZATION WITH CORONARY ANGIOGRAM;  Surgeon: Troy Sine, MD;  [facility] CATH LAB;  Service: Cardiovascular;  Laterality: N/A;  . PERCUTANEOUS CORONARY STENT INTERVENTION (PCI-S)  02/12/2013   Procedure: PERCUTANEOUS CORONARY STENT INTERVENTION (PCI-S);  Surgeon: Troy Sine, MD;  Location: [facility] CATH LAB;  Service: Cardiovascular;;  STEMI  . PERCUTANEOUS CORONARY STENT INTERVENTION (PCI-S) N/A 02/14/2013   Procedure: PERCUTANEOUS CORONARY STENT INTERVENTION (PCI-S);  Surgeon: Burnell Blanks, MD;  Location: [facility] CATH LAB;  Service: Cardiovascular;  Laterality: N/A;  Proximal LAD  . TEE WITHOUT CARDIOVERSION N/A 02/15/2013   Procedure: TRANSESOPHAGEAL ECHOCARDIOGRAM (TEE);  Surgeon: Thayer Headings, MD;  Location: [place];  Service: Cardiovascular;  Laterality: N/A;    There were no vitals filed for this visit.      Subjective Assessment - 12/19/15 0906    Subjective I have been fighting a bad cold. All the coughing made my back hurt more. My feet are numb again.     Currently in Pain? Yes   Pain Score 4    Pain Location Back   Pain Orientation Lower   Pain Descriptors / Indicators Aching   Pain Radiating Towards bilateral LE    Aggravating Factors  on feet too long   Pain Relieving Factors elevating feet  [facility] Adult PT Treatment/Exercise - 12/19/15 0001      Lumbar Exercises: Aerobic   Stationary Bike nustep L5 x 6 minutes  UE at #9      Lumbar Exercises: Machines for Strengthening   Leg Press 55# x 25    Other Lumbar Machine Exercise Low row 25# x 20 , mid row 25# x 20 , Lat pull down 25# narrow and wide x 10 each      Lumbar Exercises: Supine   Heel Slides 20 reps   Heel Slides  Limitations ab set   Bent Knee Raise 20 reps   Bent Knee Raise Limitations level 2    Bridge 20 reps   Bridge Limitations ball bw knees, in DF     Lumbar Exercises: Sidelying   Clam 20 reps   Clam Limitations both, green   Hip Abduction 20 reps   Hip Abduction Weights (lbs) both                  PT Short Term Goals - 12/19/15 0923      PT SHORT TERM GOAL #1   Title pt will demonstrate ability to maintain core contraction in supine, seated and standing by 11/17   Status Achieved     PT SHORT TERM GOAL #2   Title Pt will verbalize ability to utilize abdominal contractions and flexion bias exercises to decrease pain with daily activities   Time 3   Period Weeks   Status Achieved           PT Long Term Goals - 12/19/15 7092      PT LONG TERM GOAL #1   Title Pt will be able to pull a suitcase with minimal discomfort in order to travel for work by 12/15   Time 6   Period Weeks     PT LONG TERM GOAL #2   Title Pt will be able to sit in a chair with back support without limitaiton by low back tenderness   Status Achieved     PT LONG TERM GOAL #3   Title Pt will demonstrate proper lifting and handling of objects floor<>chest height to complete necessary work activities   Time 6   Period Weeks   Status Achieved     PT LONG TERM GOAL #4   Baseline 45 minutes reproted at best not yet consistent ; yesterday 15 minutes prior to cramps    Time 6   Period Weeks   Status Partially Met               Plan - 12/19/15 0919    Clinical Impression Statement Pt reports feeling regression due to recent upper respiratory cold. We went over approproiate gym machines for transition to independent program. He plans to gym [employer] soon.    PT Next Visit Plan flexion bias with core stabilization, gym program   PT Home Exercise Plan abdominal sets, hooklying marches, thomas stretch for hip flexors, seated HSS. piriformis stretch; table top one leg at a time. side  clam with green band, side leg lift       Patient will benefit from skilled therapeutic intervention in order to improve the following deficits and impairments:  Pain, Increased muscle spasms, Decreased activity tolerance, Decreased balance, Improper body mechanics, Postural dysfunction, Impaired sensation, Decreased strength  Visit Diagnosis: Acute bilateral low back pain, with sciatica presence unspecified  Difficulty in walking, not elsewhere classified  Other disturbances of skin sensation     Problem List  Patient Active Problem List   Diagnosis Date Noted  . Pre-operative cardiovascular examination 08/23/2015  . PVC (premature ventricular contraction) 07/24/2014  . Encounter for cardioversion procedure   . Persistent atrial fibrillation ([place])   . Pulmonary embolus ([place]) 06/20/2014  . Back pain 02/21/2014  . Chest pain 09/05/2013  . Nephrolithiasis 09/05/2013  . Hematuria, gross 09/05/2013  . Nausea and vomiting 09/05/2013  . Nausea & vomiting   . Old myocardial infarction   . Emesis 09/02/2013  . OSA (obstructive sleep apnea) 07/22/2013  . Obesity (BMI 35.0-39.9 without comorbidity) 03/07/2013  . Unstable angina - Post Infarct Angina with existing LAD disease 02/14/2013  . CAD S/P percutaneous coronary angioplasty - DES to Mid LAD; 2x 80m x 38 mm Xience DES to RCA 02/14/2013  . Dyslipidemia 02/12/2013  . NSTEMI (non-ST elevated myocardial infarction) ([place] 01/30/2013  . Tobacco abuse 01/30/2013  . Fatigue 01/30/2013  . PAF (paroxysmal atrial fibrillation) ([place] 01/30/2013    DDorene Ar PTA 12/19/2015, 10:00 AM  CL[place] Valley[place] [address]Phone: [phone]  Fax:  [phone] Name: GNISHAWN ROTANMRN: [id number] of Birth: [birth year]

## 2015-12-21 ENCOUNTER — Ambulatory Visit: Payer: BLUE CROSS/BLUE SHIELD | Admitting: Physical Therapy

## 2015-12-26 ENCOUNTER — Ambulatory Visit: Payer: BLUE CROSS/BLUE SHIELD | Admitting: Physical Therapy

## 2015-12-28 ENCOUNTER — Other Ambulatory Visit: Payer: Self-pay | Admitting: Internal Medicine

## 2015-12-28 ENCOUNTER — Ambulatory Visit: Payer: BLUE CROSS/BLUE SHIELD | Admitting: Physical Therapy

## 2015-12-28 ENCOUNTER — Encounter: Payer: Self-pay | Admitting: Physical Therapy

## 2015-12-28 DIAGNOSIS — M545 Low back pain: Secondary | ICD-10-CM

## 2015-12-28 DIAGNOSIS — R262 Difficulty in walking, not elsewhere classified: Secondary | ICD-10-CM

## 2015-12-28 DIAGNOSIS — R208 Other disturbances of skin sensation: Secondary | ICD-10-CM

## 2015-12-28 NOTE — Therapy (Signed)
[place], [place], [address] Phone: [phone]   Fax:  [phone]  Physical Therapy Treatment  Patient Details  Name: Paul Todd MRN: [id number] Date of Birth: 1960/10/23 Referring Provider: Bonnee Quin, PA-C (surgeon Lavell Anchors, MD)  Encounter Date: 12/28/2015      PT End of Session - 12/28/15 0847    Visit Number 12   Number of Visits 13   Authorization Type BCBS    PT Start Time 0845   PT Stop Time 0930   PT Time Calculation (min) 45 min   Activity Tolerance Patient tolerated treatment well   Behavior During Therapy [facility] for tasks assessed/performed      Past Medical History:  Diagnosis Date  . Arthritis       . CAD (coronary artery disease)    multivessel per cath 02/12/13, s/p stenting to RCA  . Chronic lower back pain   . Dyslipidemia   . Hypertension   . Myocardial infarction 02/12/2013   . Nephrolithiasis   . OSA on CPAP   . PE (pulmonary thromboembolism) ([place]) 02/2014  . Persistent atrial fibrillation [facility])         Past Surgical History:  Procedure Laterality Date  . CARDIOVERSION N/A 02/15/2013   Procedure: CARDIOVERSION;  Surgeon: Thayer Headings, MD;  Location: [place];  Service: Cardiovascular;  Laterality: N/A;  . CARDIOVERSION N/A 07/07/2014   Procedure: CARDIOVERSION;  Surgeon: Pixie Casino, MD;  Location: [employer];  Service: Cardiovascular;  Laterality: N/A;  . CORONARY ANGIOPLASTY WITH STENT PLACEMENT  02/12/2013  . CYSTOSCOPY WITH RETROGRADE PYELOGRAM, URETEROSCOPY AND STENT PLACEMENT Left 09/02/2013   Procedure: CYSTOSCOPY WITH RETROGRADE PYELOGRAM/LEFT URETEROSCOPY/URETERAL STENT, with fulguration;  Surgeon: Festus Aloe, MD;  [facility];  Service: Urology;  Laterality: Left;  . CYSTOSCOPY WITH URETEROSCOPY AND STENT PLACEMENT Left 09/16/2013   Procedure: CYSTOSCOPY WITH URETEROSCOPY AND STENT PLACEMENT;  Surgeon: Festus Aloe, MD;   [facility];  Service: Urology;  Laterality: Left;  . HOLMIUM LASER APPLICATION Left 04/04/5186   Procedure: HOLMIUM LASER APPLICATION;  Surgeon: Festus Aloe, MD;  [facility];  Service: Urology;  Laterality: Left;  . LEFT HEART CATHETERIZATION WITH CORONARY ANGIOGRAM N/A 02/12/2013   Procedure: LEFT HEART CATHETERIZATION WITH CORONARY ANGIOGRAM;  Surgeon: Troy Sine, MD;  Location: [facility] CATH LAB;  Service: Cardiovascular;  Laterality: N/A;  . PERCUTANEOUS CORONARY STENT INTERVENTION (PCI-S)  02/12/2013   Procedure: PERCUTANEOUS CORONARY STENT INTERVENTION (PCI-S);  Surgeon: Troy Sine, MD;  Location: [facility] CATH LAB;  Service: Cardiovascular;;  STEMI  . PERCUTANEOUS CORONARY STENT INTERVENTION (PCI-S) N/A 02/14/2013   Procedure: PERCUTANEOUS CORONARY STENT INTERVENTION (PCI-S);  Surgeon: Burnell Blanks, MD;  Location: [facility] CATH LAB;  Service: Cardiovascular;  Laterality: N/A;  Proximal LAD  . TEE WITHOUT CARDIOVERSION N/A 02/15/2013   Procedure: TRANSESOPHAGEAL ECHOCARDIOGRAM (TEE);  Surgeon: Thayer Headings, MD;  Location: [place];  Service: Cardiovascular;  Laterality: N/A;    There were no vitals filed for this visit.      Subjective Assessment - 12/28/15 0847    Subjective pt foot was run over and knocked over after grabbing door handle of car. Back is feeling tight, had cramps in R calf, bilateral feet have experienced increased puffy and irritated. Has been doing some stretches that seem to help a little.    Currently in Pain? Yes   Pain Score 2    Pain Location Back   Pain Orientation Lower  Pain Descriptors / Indicators Tightness            [facility] PT Assessment - 12/28/15 0001      Assessment   Medical Diagnosis lumbar stenosis with neural impingement L3/4 4/5 5/S1; lumbar spondylosis with eipdural lipomatosis   Referring Provider Bonnee Quin, PA-C  surgeon Lavell Anchors, MD                     [facility] Adult PT Treatment/Exercise -  12/28/15 0001      Lumbar Exercises: Stretches   Single Knee to Chest Stretch 10 seconds;3 reps  bilat     Lumbar Exercises: Aerobic   Stationary Bike nu step L6 5 min     Lumbar Exercises: Supine   Bent Knee Raise 20 reps;3 seconds  opp UE press ball into knee   Bridge 20 reps;3 seconds   Bridge Limitations ball bw knees   Straight Leg Raise 15 reps  bilat   Straight Leg Raises Limitations 20 ea also of illiopsoas lift   Other Supine Lumbar Exercises hooklying diagonals red tband with alt LE ext   Other Supine Lumbar Exercises iso HS curl over PB; HS curl + pelvic tilt over physioball     Lumbar Exercises: Sidelying   Clam 20 reps;10 reps   Clam Limitations 90/90 lift                PT Education - 12/28/15 0931    Education provided Yes   Education Details exercise form/rationale, popping in low back-exercise to stabilize   Person(s) Educated Patient   Methods Explanation;Demonstration;Tactile cues;Verbal cues   Comprehension Verbalized understanding;Returned demonstration;Verbal cues required;Tactile cues required;Need further instruction          PT Short Term Goals - 12/19/15 0923      PT SHORT TERM GOAL #1   Title pt will demonstrate ability to maintain core contraction in supine, seated and standing by 11/17   Status Achieved     PT SHORT TERM GOAL #2   Title Pt will verbalize ability to utilize abdominal contractions and flexion bias exercises to decrease pain with daily activities   Time 3   Period Weeks   Status Achieved           PT Long Term Goals - 12/19/15 7616      PT LONG TERM GOAL #1   Title Pt will be able to pull a suitcase with minimal discomfort in order to travel for work by 12/15   Time 6   Period Weeks     PT LONG TERM GOAL #2   Title Pt will be able to sit in a chair with back support without limitaiton by low back tenderness   Status Achieved     PT LONG TERM GOAL #3   Title Pt will demonstrate proper lifting and  handling of objects floor<>chest height to complete necessary work activities   Time 6   Period Weeks   Status Achieved     PT LONG TERM GOAL #4   Baseline 45 minutes reproted at best not yet consistent ; yesterday 15 minutes prior to cramps    Time 6   Period Weeks   Status Partially Met               Plan - 12/28/15 0931    Clinical Impression Statement Pt tolerated exercises well, verbalizing decrease in low back tightness following treatment. Pt verbalized concern over continued popping in low back when laying down,  most notable when fatigued. [place] usually decreases leg discomfort but can be painful when it occurs. I advised him to continue strenghtening to improve stabilization support to lumbar spine and return to MD should it continue to cause problems.    PT Treatment/Interventions ADLs/Self Care Home Management;Cryotherapy;Electrical Stimulation;Iontophoresis '4mg'$ /ml Dexamethasone;Functional mobility training;Stair training;Gait training;Ultrasound;Traction;Moist Heat;Therapeutic activities;Therapeutic exercise;Balance training;Neuromuscular re-education;Patient/family education;Passive range of motion;Manual techniques;Dry needling;Taping   PT Next Visit Plan d/c   PT Home Exercise Plan abdominal sets, hooklying marches, thomas stretch for hip flexors, seated HSS. piriformis stretch; table top one leg at a time. side clam with green band, side leg lift    Consulted and Agree with Plan of Care Patient      Patient will benefit from skilled therapeutic intervention in order to improve the following deficits and impairments:  Pain, Increased muscle spasms, Decreased activity tolerance, Decreased balance, Improper body mechanics, Postural dysfunction, Impaired sensation, Decreased strength  Visit Diagnosis: Acute bilateral low back pain, with sciatica presence unspecified - Plan: PT plan of care cert/re-cert  Difficulty in walking, not elsewhere classified - Plan: PT plan  of care cert/re-cert  Other disturbances of skin sensation - Plan: PT plan of care cert/re-cert     Problem List Patient Active Problem List   Diagnosis Date Noted  . Pre-operative cardiovascular examination 08/23/2015  . PVC (premature ventricular contraction) 07/24/2014  . Encounter for cardioversion procedure   . Persistent atrial fibrillation ([place])   . Pulmonary embolus ([place]) 06/20/2014  . Back pain 02/21/2014  . Chest pain 09/05/2013  . Nephrolithiasis 09/05/2013  . Hematuria, gross 09/05/2013  . Nausea and vomiting 09/05/2013  . Nausea & vomiting   . Old myocardial infarction   . Emesis 09/02/2013  . OSA (obstructive sleep apnea) 07/22/2013  . Obesity (BMI 35.0-39.9 without comorbidity) 03/07/2013  . Unstable angina - Post Infarct Angina with existing LAD disease 02/14/2013  . CAD S/P percutaneous coronary angioplasty - DES to Mid LAD; 2x 3m x 38 mm Xience DES to RCA 02/14/2013  . Dyslipidemia 02/12/2013  . NSTEMI (non-ST elevated myocardial infarction) ([place] 01/30/2013  . Tobacco abuse 01/30/2013  . Fatigue 01/30/2013  . PAF (paroxysmal atrial fibrillation) ([place] 01/30/2013    Glena Pharris C. Katha Kuehne PT, DPT 12/28/15 11:39 AM   [facility] [address] [place] [address]Phone: [phone]  Fax:  [phone] Name: GHENRYK URSINMRN: [id number] of Birth: [birth year]

## 2016-01-02 ENCOUNTER — Ambulatory Visit: Payer: BLUE CROSS/BLUE SHIELD | Admitting: Physical Therapy

## 2016-01-02 ENCOUNTER — Encounter: Payer: Self-pay | Admitting: Physical Therapy

## 2016-01-02 DIAGNOSIS — R208 Other disturbances of skin sensation: Secondary | ICD-10-CM

## 2016-01-02 DIAGNOSIS — M545 Low back pain: Secondary | ICD-10-CM | POA: Diagnosis not present

## 2016-01-02 DIAGNOSIS — R262 Difficulty in walking, not elsewhere classified: Secondary | ICD-10-CM

## 2016-01-02 NOTE — Therapy (Signed)
[place], [place], [address] Phone: [phone]   Fax:  [phone]  Physical Therapy Treatment/Discharge  Patient Details  Name: Paul Todd MRN: [id number] Date of Birth: December 29, 1960 Referring Provider: Bonnee Quin, PA-C (surgeon Lavell Anchors, MD)  Encounter Date: 01/02/2016      PT End of Session - 01/02/16 0850    Visit Number 13   Number of Visits 13   Date for PT Re-Evaluation 01/02/16   Authorization Type BCBS    PT Start Time [phone]  pt arrived late   PT Stop Time 0929   PT Time Calculation (min) 39 min   Activity Tolerance Patient tolerated treatment well   Behavior During Therapy [facility] for tasks assessed/performed      Past Medical History:  Diagnosis Date  . Arthritis       . CAD (coronary artery disease)    multivessel per cath 02/12/13, s/p stenting to RCA  . Chronic lower back pain   . Dyslipidemia   . Hypertension   . Myocardial infarction 02/12/2013   . Nephrolithiasis   . OSA on CPAP   . PE (pulmonary thromboembolism) ([place]) 02/2014  . Persistent atrial fibrillation [facility])         Past Surgical History:  Procedure Laterality Date  . CARDIOVERSION N/A 02/15/2013   Procedure: CARDIOVERSION;  Surgeon: Thayer Headings, MD;  Location: [place];  Service: Cardiovascular;  Laterality: N/A;  . CARDIOVERSION N/A 07/07/2014   Procedure: CARDIOVERSION;  Surgeon: Pixie Casino, MD;  Location: [place];  Service: Cardiovascular;  Laterality: N/A;  . CORONARY ANGIOPLASTY WITH STENT PLACEMENT  02/12/2013  . CYSTOSCOPY WITH RETROGRADE PYELOGRAM, URETEROSCOPY AND STENT PLACEMENT Left 09/02/2013   Procedure: CYSTOSCOPY WITH RETROGRADE PYELOGRAM/LEFT URETEROSCOPY/URETERAL STENT, with fulguration;  Surgeon: Festus Aloe, MD;  [facility];  Service: Urology;  Laterality: Left;  . CYSTOSCOPY WITH URETEROSCOPY AND STENT PLACEMENT Left 09/16/2013   Procedure: CYSTOSCOPY WITH  URETEROSCOPY AND STENT PLACEMENT;  Surgeon: Festus Aloe, MD;  [facility];  Service: Urology;  Laterality: Left;  . HOLMIUM LASER APPLICATION Left 1/69/6789   Procedure: HOLMIUM LASER APPLICATION;  Surgeon: Festus Aloe, MD;  [facility];  Service: Urology;  Laterality: Left;  . LEFT HEART CATHETERIZATION WITH CORONARY ANGIOGRAM N/A 02/12/2013   Procedure: LEFT HEART CATHETERIZATION WITH CORONARY ANGIOGRAM;  Surgeon: Troy Sine, MD;  [facility];  Service: Cardiovascular;  Laterality: N/A;  . PERCUTANEOUS CORONARY STENT INTERVENTION (PCI-S)  02/12/2013   Procedure: PERCUTANEOUS CORONARY STENT INTERVENTION (PCI-S);  Surgeon: Troy Sine, MD;  Location: [facility] CATH LAB;  Service: Cardiovascular;;  STEMI  . PERCUTANEOUS CORONARY STENT INTERVENTION (PCI-S) N/A 02/14/2013   Procedure: PERCUTANEOUS CORONARY STENT INTERVENTION (PCI-S);  Surgeon: Burnell Blanks, MD;  Location: [facility] CATH LAB;  Service: Cardiovascular;  Laterality: N/A;  Proximal LAD  . TEE WITHOUT CARDIOVERSION N/A 02/15/2013   Procedure: TRANSESOPHAGEAL ECHOCARDIOGRAM (TEE);  Surgeon: Thayer Headings, MD;  Location: [place];  Service: Cardiovascular;  Laterality: N/A;    There were no vitals filed for this visit.      Subjective Assessment - 01/02/16 0850    Subjective Foot is feeling a lot better, still has toe pain. Back is a little achey, did a lot of walking yesterday. Minor decrease in "cotton ball" sensation and "burning" in feet. Significant decrease in cramping, still comes with fatigue. Mild soreness after last visit that went away the next day. Notices significant increases in pain with  missing even one day of stretching.    How long can you walk comfortably? 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), teaches auto collision repair (handle equipment/parts)                         [facility] Adult PT Treatment/Exercise - 01/02/16 0001       Lumbar Exercises: Stretches   Single Knee to Chest Stretch Other (comment)  3x5s each   Double Knee to Chest Stretch 10 seconds;3 reps     Lumbar Exercises: Aerobic   Stationary Bike nu step L6 5 min     Lumbar Exercises: Supine   Bent Knee Raise 20 reps;3 seconds   Bridge 20 reps;3 seconds     Lumbar Exercises: Sidelying   Clam 20 reps  bilat   Hip Abduction 20 reps   Hip Abduction Weights (lbs) both                PT Education - 01/02/16 0918    Education provided Yes   Education Details exercise form/rationale, HEP, stretching frequency, gym program, balance challenges   Person(s) Educated Patient   Methods Explanation;Verbal cues   Comprehension Verbalized understanding          PT Short Term Goals - 12/19/15 0923      PT SHORT TERM GOAL #1   Title pt will demonstrate ability to maintain core contraction in supine, seated and standing by 11/17   Status Achieved     PT SHORT TERM GOAL #2   Title Pt will verbalize ability to utilize abdominal contractions and flexion bias exercises to decrease pain with daily activities   Time 3   Period Weeks   Status Achieved           PT Long Term Goals - 01/02/16 0930      PT LONG TERM GOAL #1   Title Pt will be able to pull a suitcase with minimal discomfort in order to travel for work by 12/15   Status Not Met     PT LONG TERM GOAL #2   Title Pt will be able to sit in a chair with back support without limitaiton by low back tenderness   Status Achieved     PT LONG TERM GOAL #3   Title Pt will demonstrate proper lifting and handling of objects floor<>chest height to complete necessary work activities   Status Achieved     PT LONG TERM GOAL #4   Title Pt will verbalize at least 50% improvement in sensation of leg weakness when walking   Baseline 45 minutes reproted at best not yet consistent ; yesterday 15 minutes prior to cramps    Status Partially Met               Plan - 01/02/16 0919     Clinical Impression Statement Pt is being d/c to independent program at this time. Pt has made significant improvements with treatment with some limitations still present, overall improved in functional ability in ADLs. Pt verbalized comfort and understanding of exercises and was instructed to contact us with any questions.    Consulted and Agree with Plan of Care Patient      Patient will benefit from skilled therapeutic intervention in order to improve the following deficits and impairments:     Visit Diagnosis: Acute bilateral low back pain, with sciatica presence unspecified  Difficulty in walking, not elsewhere classified  Other  disturbances of skin sensation     Problem List Patient Active Problem List   Diagnosis Date Noted  . Pre-operative cardiovascular examination 08/23/2015  . PVC (premature ventricular contraction) 07/24/2014  . Encounter for cardioversion procedure   . Persistent atrial fibrillation ([place])   . Pulmonary embolus ([place]) 06/20/2014  . Back pain 02/21/2014  . Chest pain 09/05/2013  . Nephrolithiasis 09/05/2013  . Hematuria, gross 09/05/2013  . Nausea and vomiting 09/05/2013  . Nausea & vomiting   . Old myocardial infarction   . Emesis 09/02/2013  . OSA (obstructive sleep apnea) 07/22/2013  . Obesity (BMI 35.0-39.9 without comorbidity) 03/07/2013  . Unstable angina - Post Infarct Angina with existing LAD disease 02/14/2013  . CAD S/P percutaneous coronary angioplasty - DES to Mid LAD; 2x 69m x 38 mm Xience DES to RCA 02/14/2013  . Dyslipidemia 02/12/2013  . NSTEMI (non-ST elevated myocardial infarction) ([place] 01/30/2013  . Tobacco abuse 01/30/2013  . Fatigue 01/30/2013  . PAF (paroxysmal atrial fibrillation) ([place] 01/30/2013    PHYSICAL THERAPY DISCHARGE SUMMARY  Visits from Start of Care: 13  Current functional level related to goals / functional outcomes: See above   Remaining deficits: See above   Education / Equipment: Anatomy of  condition, POC, HEP, exercise form/rationale  Plan: Patient agrees to discharge.  Patient goals were partially met. Patient is being discharged due to meeting the stated rehab goals.  ?????   Paul Todd C. Camar Guyton PT, DPT 01/02/16 9:42 AM     [facility] [address]Phone: [phone]  Fax:  [phone] Name: Paul HURLBUTMRN: [id number] of Birth: [birth year]

## 2016-01-04 ENCOUNTER — Ambulatory Visit: Payer: BLUE CROSS/BLUE SHIELD | Admitting: Physical Therapy

## 2016-01-05 ENCOUNTER — Other Ambulatory Visit (HOSPITAL_COMMUNITY): Payer: Self-pay | Admitting: Nurse Practitioner

## 2016-01-29 ENCOUNTER — Other Ambulatory Visit: Payer: Self-pay | Admitting: Internal Medicine

## 2016-01-29 NOTE — Telephone Encounter (Signed)
REFILL 

## 2016-02-08 ENCOUNTER — Telehealth (HOSPITAL_COMMUNITY): Payer: Self-pay | Admitting: *Deleted

## 2016-02-08 NOTE — Telephone Encounter (Signed)
LMOM for pt to clbk to sched follow up for afib from recall notice

## 2016-02-09 ENCOUNTER — Encounter (HOSPITAL_COMMUNITY): Payer: Self-pay

## 2016-02-09 ENCOUNTER — Emergency Department (HOSPITAL_COMMUNITY)
Admission: EM | Admit: 2016-02-09 | Discharge: 2016-02-09 | Disposition: A | Discharge status: Home / Self Care | Payer: BLUE CROSS/BLUE SHIELD | Attending: Emergency Medicine | Admitting: Emergency Medicine

## 2016-02-09 DIAGNOSIS — Z955 Presence of coronary angioplasty implant and graft: Secondary | ICD-10-CM | POA: Diagnosis not present

## 2016-02-09 DIAGNOSIS — R29810 Facial weakness: Secondary | ICD-10-CM | POA: Diagnosis present

## 2016-02-09 DIAGNOSIS — G510 Bell's palsy: Secondary | ICD-10-CM | POA: Diagnosis not present

## 2016-02-09 DIAGNOSIS — H6121 Impacted cerumen, right ear: Secondary | ICD-10-CM

## 2016-02-09 DIAGNOSIS — Z87891 Personal history of nicotine dependence: Secondary | ICD-10-CM | POA: Insufficient documentation

## 2016-02-09 DIAGNOSIS — Z79899 Other long term (current) drug therapy: Secondary | ICD-10-CM | POA: Insufficient documentation

## 2016-02-09 DIAGNOSIS — I252 Old myocardial infarction: Secondary | ICD-10-CM | POA: Insufficient documentation

## 2016-02-09 DIAGNOSIS — Z7982 Long term (current) use of aspirin: Secondary | ICD-10-CM | POA: Insufficient documentation

## 2016-02-09 DIAGNOSIS — I10 Essential (primary) hypertension: Secondary | ICD-10-CM | POA: Diagnosis not present

## 2016-02-09 DIAGNOSIS — H918X1 Other specified hearing loss, right ear: Secondary | ICD-10-CM | POA: Diagnosis not present

## 2016-02-09 DIAGNOSIS — I2510 Atherosclerotic heart disease of native coronary artery without angina pectoris: Secondary | ICD-10-CM | POA: Diagnosis not present

## 2016-02-09 MED ORDER — PREDNISONE 10 MG PO TABS: ORAL_TABLET | ORAL | 0 refills | Status: DC

## 2016-02-09 MED ORDER — VALACYCLOVIR HCL 1 G PO TABS: 1000.0000 mg | ORAL_TABLET | Freq: Three times a day (TID) | ORAL | 0 refills | Status: AC

## 2016-02-09 MED ORDER — CARBAMIDE PEROXIDE 6.5 % OT SOLN
5.0000 [drp] | Freq: Once | OTIC | Status: AC
  Administered 2016-02-09: 5 [drp] via OTIC
  Filled 2016-02-09: qty 15

## 2016-02-09 NOTE — ED Notes (Signed)
This RN attempted to remove ear wax but was unable to. Another RN to attempt.

## 2016-02-09 NOTE — Discharge Instructions (Signed)
1. Medications: Prednisone, valacyclovir, usual home medications 2. Treatment: rest, drink plenty of fluids,  3. Follow Up: Please followup with your primary doctor in 5-7 days for discussion of your diagnoses and further evaluation after today's visit; if you do not have a primary care doctor use the resource guide provided to find one; Please return to the ER for new or worsening symptoms, weakness, dizziness, vision changes or other concerns.

## 2016-02-09 NOTE — ED Triage Notes (Signed)
Onset upon awakening this morning right facial paralysis.  Onset 1 week bilateral ear pain and "swooshing sound" in ear x several months.

## 2016-02-09 NOTE — ED Provider Notes (Signed)
[facility] DEPT Provider Note   CSN: [id number] Arrival date & time: 02/09/16  1845     History   Chief Complaint Chief Complaint  Patient presents with  . Paralysis    HPI Paul Todd is a 56 y.o. male with a hx of arthritis, CAD/MI (S/P percutaneous coronary angioplasty - DES to Mid LAD; 2x 16mm x 38 mm Xience DES to RCA), chronic low back pain (surgery 5 mos ago), HTN, chronic a-fib (on Xarelto), PE presents to the Emergency Department complaining of acute, persistent, right facial droop and paralysis noted last night but worse upon waking this morning.  Associated symptoms include "swooshing and ringing sound" in right ear for months with development of pain 1 week ago.  Nothing makes it better and nothing makes it worse.  Pt denies fever, chills, headache, neck pain, chest pain, SOB, abd pain, N/V/D, extremity weakness, numbness, confusion, vision changes.    Pt has been compliant with medications including Xarelto and statins.     The history is provided by the patient and medical records. No language interpreter was used.    Past Medical History:  Diagnosis Date  . Arthritis       . CAD (coronary artery disease)    multivessel per cath 02/12/13, s/p stenting to RCA  . Chronic lower back pain   . Dyslipidemia   . Hypertension   . Myocardial infarction 02/12/2013   . Nephrolithiasis   . OSA on CPAP   . PE (pulmonary thromboembolism) ([place]) 02/2014  . Persistent atrial fibrillation [facility])         Patient Active Problem List   Diagnosis Date Noted  . Pre-operative cardiovascular examination 08/23/2015  . PVC (premature ventricular contraction) 07/24/2014  . Encounter for cardioversion procedure   . Persistent atrial fibrillation ([place])   . Pulmonary embolus ([place]) 06/20/2014  . Back pain 02/21/2014  . Chest pain 09/05/2013  . Nephrolithiasis 09/05/2013  . Hematuria, gross 09/05/2013  . Nausea and vomiting 09/05/2013  . Nausea & vomiting   . Old myocardial  infarction   . Emesis 09/02/2013  . OSA (obstructive sleep apnea) 07/22/2013  . Obesity (BMI 35.0-39.9 without comorbidity) 03/07/2013  . Unstable angina - Post Infarct Angina with existing LAD disease 02/14/2013  . CAD S/P percutaneous coronary angioplasty - DES to Mid LAD; 2x 47mm x 38 mm Xience DES to RCA 02/14/2013  . Dyslipidemia 02/12/2013  . NSTEMI (non-ST elevated myocardial infarction) ([place]) 01/30/2013  . Tobacco abuse 01/30/2013  . Fatigue 01/30/2013  . PAF (paroxysmal atrial fibrillation) ([place]) 01/30/2013    Past Surgical History:  Procedure Laterality Date  . CARDIOVERSION N/A 02/15/2013   Procedure: CARDIOVERSION;  Surgeon: Thayer Headings, MD;  Location: [place];  Service: Cardiovascular;  Laterality: N/A;  . CARDIOVERSION N/A 07/07/2014   Procedure: CARDIOVERSION;  Surgeon: Pixie Casino, MD;  Location: [place];  Service: Cardiovascular;  Laterality: N/A;  . CORONARY ANGIOPLASTY WITH STENT PLACEMENT  02/12/2013  . CYSTOSCOPY WITH RETROGRADE PYELOGRAM, URETEROSCOPY AND STENT PLACEMENT Left 09/02/2013   Procedure: CYSTOSCOPY WITH RETROGRADE PYELOGRAM/LEFT URETEROSCOPY/URETERAL STENT, with fulguration;  Surgeon: Festus Aloe, MD;  Location: [facility];  Service: Urology;  Laterality: Left;  . CYSTOSCOPY WITH URETEROSCOPY AND STENT PLACEMENT Left 09/16/2013   Procedure: CYSTOSCOPY WITH URETEROSCOPY AND STENT PLACEMENT;  Surgeon: Festus Aloe, MD;  Location: [facility];  Service: Urology;  Laterality: Left;  . HOLMIUM LASER APPLICATION Left [id number]   Procedure: HOLMIUM LASER APPLICATION;  Surgeon: Festus Aloe, MD;  Location: [facility];  Service: Urology;  Laterality: Left;  . LEFT HEART CATHETERIZATION WITH CORONARY ANGIOGRAM N/A 02/12/2013   Procedure: LEFT HEART CATHETERIZATION WITH CORONARY ANGIOGRAM;  Surgeon: Troy Sine, MD;  Location: [facility] CATH LAB;  Service: Cardiovascular;  Laterality: N/A;  . PERCUTANEOUS CORONARY STENT INTERVENTION (PCI-S)  02/12/2013    Procedure: PERCUTANEOUS CORONARY STENT INTERVENTION (PCI-S);  Surgeon: Troy Sine, MD;  Location: [facility] CATH LAB;  Service: Cardiovascular;;  STEMI  . PERCUTANEOUS CORONARY STENT INTERVENTION (PCI-S) N/A 02/14/2013   Procedure: PERCUTANEOUS CORONARY STENT INTERVENTION (PCI-S);  Surgeon: Burnell Blanks, MD;  [facility] LAB;  Service: Cardiovascular;  Laterality: N/A;  Proximal LAD  . TEE WITHOUT CARDIOVERSION N/A 02/15/2013   Procedure: TRANSESOPHAGEAL ECHOCARDIOGRAM (TEE);  Surgeon: Thayer Headings, MD;  Location: [place];  Service: Cardiovascular;  Laterality: N/A;    OB History    No data available       Home Medications    Prior to Admission medications   Medication Sig Start Date End Date Taking? Authorizing Provider  acetaminophen (TYLENOL) 650 MG CR tablet Take 650 mg by mouth every 8 (eight) hours as needed for pain.   Yes Historical Provider, MD  aspirin EC 81 MG tablet Take 81 mg by mouth daily.   Yes Historical Provider, MD  atorvastatin (LIPITOR) 80 MG tablet Take 1 tablet (80 mg total) by mouth daily at 6 PM. 06/05/15  Yes Pixie Casino, MD  Docusate Calcium (STOOL SOFTENER PO) Take 2 capsules by mouth at bedtime.   Yes Historical Provider, MD  dofetilide (TIKOSYN) 250 MCG capsule Take 1 capsule (250 mcg total) by mouth 2 (two) times daily. 08/06/15  Yes Pixie Casino, MD  DULoxetine (CYMBALTA) 30 MG capsule Take 30 mg by mouth at bedtime.  12/24/13  Yes Historical Provider, MD  magnesium oxide (MAG-OX) 400 (241.3 MG) MG tablet Take 1 tablet (400 mg total) by mouth daily. 07/25/14  Yes Amber Sena Slate, NP  metoprolol succinate (TOPROL-XL) 50 MG 24 hr tablet Take 1 tablet (50 mg total) by mouth daily. Take with or immediately following a meal. 06/26/15  Yes Pixie Casino, MD  Multiple Vitamin (MULTIVITAMIN WITH MINERALS) TABS tablet Take 1 tablet by mouth every evening.    Yes Historical Provider, MD  nitroGLYCERIN (NITROSTAT) 0.4 MG SL tablet PLACE 1 TABLET  UNDER TONGUE EVERY 5 MINUTES AS NEEDED FOR CHEST PAIN MAX 3 TABLETS 01/08/16  Yes Pixie Casino, MD  NON FORMULARY at bedtime. CPAP   Yes Historical Provider, MD  pantoprazole (PROTONIX) 40 MG tablet Take 1 tablet (40 mg total) by mouth daily. NEED OV. 01/29/16  Yes Pixie Casino, MD  potassium chloride (K-DUR) 10 MEQ tablet TAKE 1 TABLET(10 MEQ) BY MOUTH DAILY 10/01/15  Yes Sherran Needs, NP  XARELTO 20 MG TABS tablet TAKE 1 TABLET (20 MG TOTAL) BY MOUTH DAILY WITH SUPPER. 12/28/15  Yes Pixie Casino, MD  predniSONE (DELTASONE) 10 MG tablet 6 tabs po daily x 5 days, then 5 tabs x 1 day, then 4 tabs x 1 day, then 3 tabs x 1 day, then 2 tabs x 1 day, then 1 tabs x 1 day 02/09/16   Jarrett Soho Lexxie Winberg, PA-C  valACYclovir (VALTREX) 1000 MG tablet Take 1 tablet (1,000 mg total) by mouth 3 (three) times daily. 02/09/16 02/16/16  Jarrett Soho Cristian Grieves, PA-C    Family History Family History  Problem Relation Age of Onset  . Heart disease Mother   . Diabetes Mother   .  Diabetes Father     Social History Social History  Substance Use Topics  . Smoking status: Former Smoker    Packs/day: 0.50    Years: 35.00    Types: Cigarettes    Quit date: 02/12/2013  . Smokeless tobacco: Never Used  . Alcohol use Yes     Comment: 07/21/2014 "never drank much; might have a couple drinks/yr since MI 02/2013"     Allergies   Patient has no known allergies.   Review of Systems Review of Systems  HENT: Positive for ear pain ( Right) and hearing loss ( Gradual, progressive, right).   Neurological: Positive for facial asymmetry.  All other systems reviewed and are negative.    Physical Exam Updated Vital Signs BP 168/94 (BP Location: Right Arm)   Pulse 66   Temp 97.5 F (36.4 C)   Resp 16   SpO2 100%   Physical Exam  Constitutional: He is oriented to person, place, and time. He appears well-developed and well-nourished. No distress.  HENT:  Head: Normocephalic and atraumatic.  Right Ear: External  ear normal. Decreased hearing is noted.  Left Ear: Hearing, tympanic membrane, external ear and ear canal normal. No decreased hearing is noted.  Mouth/Throat: Oropharynx is clear and moist.  Cerumen impaction in the right ear. Patient reports decreased hearing  Eyes: Conjunctivae and EOM are normal. Pupils are equal, round, and reactive to light. No scleral icterus.  No horizontal, vertical or rotational nystagmus  Neck: Normal range of motion. Neck supple.  Full active and passive ROM without pain No midline or paraspinal tenderness No nuchal rigidity or meningeal signs  Cardiovascular: Normal rate, regular rhythm and intact distal pulses.   Pulmonary/Chest: Effort normal and breath sounds normal. No respiratory distress. He has no wheezes. He has no rales.  Abdominal: Soft. Bowel sounds are normal. There is no tenderness. There is no rebound and no guarding.  Musculoskeletal: Normal range of motion.  Lymphadenopathy:    He has no cervical adenopathy.  Neurological: He is alert and oriented to person, place, and time. No cranial nerve deficit. He exhibits normal muscle tone. Coordination normal.  Mental Status:  Alert, oriented, thought content appropriate. Speech fluent without evidence of aphasia. Able to follow 2 step commands without difficulty.  Cranial Nerves:  II:  Peripheral visual fields grossly normal, pupils equal, round, reactive to light III,IV, VI: ptosis not present, extra-ocular motions intact bilaterally  V,VII: Right facial droop with loss of the nasolabial fold and folds of the forehead, subjectively decreased sensation on the right VIII: Slightly decreased hearing on the right  IX,X: midline uvula rise  XI: bilateral shoulder shrug equal and strong XII: midline tongue extension  Motor:  5/5 in upper and lower extremities bilaterally including strong and equal grip strength and dorsiflexion/plantar flexion Sensory: Pinprick and light touch normal in all extremities.    Cerebellar: normal finger-to-nose with bilateral upper extremities Gait: normal gait and balance CV: distal pulses palpable throughout   Skin: Skin is warm and dry. No rash noted. He is not diaphoretic.  No rash, specifically no vesicles to the head, scalp, face or in the ear  Psychiatric: He has a normal mood and affect. His behavior is normal. Judgment and thought content normal.  Nursing note and vitals reviewed.    ED Treatments / Results   Procedures .Ear Cerumen Removal Date/Time: 02/09/2016 11:37 PM Performed by: Abigail Butts Authorized by: Abigail Butts   Consent:    Consent obtained:  Verbal   Consent  given by:  Patient   Risks discussed:  Pain, TM perforation and incomplete removal   Alternatives discussed:  No treatment Procedure details:    Location:  R ear   Procedure type: irrigation   Post-procedure details:    Inspection:  TM intact   Hearing quality:  Improved   Patient tolerance of procedure:  Tolerated well, no immediate complications   (including critical care time)  Medications Ordered in ED Medications  carbamide peroxide (DEBROX) 6.5 % otic solution 5 drop (5 drops Both Ears Given 02/09/16 2115)     Initial Impression / Assessment and Plan / ED Course  I have reviewed the triage vital signs and the nursing notes.  Pertinent labs & imaging results that were available during my care of the patient were reviewed by me and considered in my medical decision making (see chart for details).     Patient presents with right-sided facial droop onset 24 hours prior to arrival. Neurologic exam is reassuring and only deficit is a right-sided facial droop. This does involve the nasolabial fold and forehead.  Decreased closure of the right upper eyelid with mild lower eyelid sagging.  Clinical exam consistent with Bell's palsy. No vision changes, numbness, weakness or other neurologic findings to suggest CVA.  Patient also with right otalgia.  Cerumen impaction noted.  No vesicles to suggest shingles or herpes the patient has a history of same.  Cerumen removal via irrigation. TM visualized without evidence of otitis media. Patient will be discharged home with a prednisone and valacyclovir. Discussed reasons to return immediately to the emergency department including development of any additional neurologic symptoms. Patient states understanding and is in agreement with the plan.  Final Clinical Impressions(s) / ED Diagnoses   Final diagnoses:  Bell's palsy  Hearing loss of right ear due to cerumen impaction    New Prescriptions Discharge Medication List as of 02/09/2016 11:12 PM    START taking these medications   Details  predniSONE (DELTASONE) 10 MG tablet 6 tabs po daily x 5 days, then 5 tabs x 1 day, then 4 tabs x 1 day, then 3 tabs x 1 day, then 2 tabs x 1 day, then 1 tabs x 1 day, Print    valACYclovir (VALTREX) 1000 MG tablet Take 1 tablet (1,000 mg total) by mouth 3 (three) times daily., Starting Sat 02/09/2016, Until Sat 02/16/2016, Print         [employer], PA-C 02/09/16 [id number]    Noemi Chapel, MD 02/20/16 1010

## 2016-02-12 ENCOUNTER — Other Ambulatory Visit: Payer: Self-pay

## 2016-02-12 MED ORDER — DOFETILIDE 250 MCG PO CAPS: 250.0000 ug | ORAL_CAPSULE | Freq: Two times a day (BID) | ORAL | 2 refills | Status: DC

## 2016-02-19 ENCOUNTER — Other Ambulatory Visit: Payer: Self-pay

## 2016-02-20 ENCOUNTER — Encounter: Payer: Self-pay | Admitting: Neurology

## 2016-02-20 ENCOUNTER — Ambulatory Visit (INDEPENDENT_AMBULATORY_CARE_PROVIDER_SITE_OTHER): Payer: BLUE CROSS/BLUE SHIELD | Admitting: Neurology

## 2016-02-20 VITALS — BP 136/79 | HR 52 | Ht 67.0 in | Wt 254.4 lb

## 2016-02-20 DIAGNOSIS — G510 Bell's palsy: Secondary | ICD-10-CM

## 2016-02-20 MED ORDER — TOPIRAMATE 50 MG PO TABS: 50.0000 mg | ORAL_TABLET | Freq: Two times a day (BID) | ORAL | 2 refills | Status: DC

## 2016-02-20 NOTE — Patient Instructions (Signed)
I had a long discussion the patient with regards to his right-sided Bell's palsy and expect gradual improvement over the next few months. I recommend he continue to use artificial tears liberally during the day and to use Lacri-Lube ointment at night when he sleeps. Discontinue gabapentin and changed to Topamax 50 mg daily for 1 week to be increased to twice daily to help with his pain. Check MRI scan of the brain with and without contrast to rule out structural lesions involving the facial nerve He may return to work but to increase his work gradually. Return for follow-up to see me in 6 weeks or call earlier if necessary. Bell Palsy Bell palsy is a condition in which the muscles on one side of the face become paralyzed. This often causes one side of the face to droop. It is a common condition and most people recover completely. RISK FACTORS Risk factors for Bell palsy include:  Pregnancy.  Diabetes.  An infection by a virus, such as infections that cause cold sores. CAUSES  Bell palsy is caused by damage to or inflammation of a nerve in your face. It is unclear why this happens, but an infection by a virus may lead to it. Most of the time the reason it happens is unknown. SIGNS AND SYMPTOMS  Symptoms can range from mild to severe and can take place over a number of hours. Symptoms may include:  Being unable to:  Raise one or both eyebrows.  Close one or both eyes.  Feel parts of your face (facial numbness).  Drooping of the eyelid and corner of the mouth.  Weakness in the face.  Paralysis of half your face.  Loss of taste.  Sensitivity to loud noises.  Difficulty chewing.  Tearing up of the affected eye.  Dryness in the affected eye.  Drooling.  Pain behind one ear. DIAGNOSIS  Diagnosis of Bell palsy may include:  A medical history and physical exam.  An MRI.  A CT scan.  Electromyography (EMG). This is a test that checks how your nerves are working. TREATMENT   Treatment may include antiviral medicine to help shorten the length of the condition. Sometimes treatment is not needed and the symptoms go away on their own. HOME CARE INSTRUCTIONS   Take medicines only as directed by your health care provider.  Do facial massages and exercises as directed by your health care provider.  If your eye is affected:  Use moisturizing eye drops to prevent drying of your eye as directed by your health care provider.  Protect your eye as directed by your health care provider. SEEK MEDICAL CARE IF:  Your symptoms do not get better or get worse.  You are drooling.  Your eye is red, irritated, or hurts. SEEK IMMEDIATE MEDICAL CARE IF:   Another part of your body feels weak or numb.  You have difficulty swallowing.  You have a fever along with symptoms of Bell palsy.  You develop neck pain. MAKE SURE YOU:   Understand these instructions.  Will watch your condition.  Will get help right away if you are not doing well or get worse. This information is not intended to replace advice given to you by your health care provider. Make sure you discuss any questions you have with your health care provider. Document Released: 12/23/2004 Document Revised: 09/13/2014 Document Reviewed: 04/01/2013 Elsevier Interactive Patient Education  2017 [employer].

## 2016-02-20 NOTE — Progress Notes (Signed)
[facility] [address] [place]. [place] [address] [id number]       OFFICE CONSULT NOTE  [name] Date of Birth:  1960-01-14 Medical Record Number:  [id number]   Referring MD:  Sofie Rower  Reason for Referral:  Bell`s palsy HPI: [name] is a 56 year old Caucasian male who on 02/08/16 noticed sudden onset of right facial droop and paralysis when he woke up the morning. He also noted associated symptoms of ringing sound in the right ear as well as swooshing sensation for a few months prior and developed pain behind the right ear 1 week prior. She denied any fever, chills, headache, neck pain, double vision, blurred vision. Patient on inquiry admitted to mild hyperacusis as well as diminished taste sensation in the right ear. He was seen in the emergency room and 02/09/16 and found to have right peripheral facial nerve weakness with slight diminished hearing on the right but and started on prednisone taper pack and and Valtrex. He states he has  finished the course of both medications. He has noticed some improvement in his facial weakness but not complete yet. He still has significant pain behind his right ear. He has been taking gabapentin 100 mg 3 times daily but does not like to wait makes him feel and it has not helped the pain significantly either. He denies any tick bite, exposure to ticks and rash. He has been using artificial tears liberally during the day but at night does not use anything. He states he is not sure if his eyes close completely at night. He does have chronic atrial fibrillation and take Xarelto for that. He has chronic back pain and arthritis as well. He has not had any brain imaging studies done.  ROS:   14 system review of systems is positive for fatigue, hearing loss, ringing in the ears, spinning sensation, blurred vision, eye pain, snoring, increased thirst, memory loss, headache, dizziness, tremor and all other systems  negative  PMH:  Past Medical History:  Diagnosis Date  . Arthritis       . CAD (coronary artery disease)    multivessel per cath 02/12/13, s/p stenting to RCA  . Chronic lower back pain   . Dyslipidemia   . Hx of Bell's palsy   . Hypertension   . Myocardial infarction 02/12/2013   . Nephrolithiasis   . OSA on CPAP   . PE (pulmonary thromboembolism) ([place]) 02/2014  . Persistent atrial fibrillation ([place])         Social History:  Social History   Social History  . Marital status: Married    Spouse name: N/A  . Number of children: N/A  . Years of education: N/A   Occupational History  . Not on file.   Social History Main Topics  . Smoking status: Former Smoker    Packs/day: 0.50    Years: 35.00    Types: Cigarettes    Quit date: 02/12/2013  . Smokeless tobacco: Never Used  . Alcohol use No     Comment: 07/21/2014 "never drank much; might have a couple drinks/yr since MI 02/2013"  . Drug use: No  . Sexual activity: Yes    Birth control/ protection: None   Other Topics Concern  . Not on file   Social History Narrative  . No narrative on file    Medications:   Current Outpatient Prescriptions on File Prior to Visit  Medication Sig Dispense Refill  . acetaminophen (TYLENOL) 650 MG CR tablet  Take 650 mg by mouth every 8 (eight) hours as needed for pain.    Marland Kitchen aspirin EC 81 MG tablet Take 81 mg by mouth daily.    Marland Kitchen atorvastatin (LIPITOR) 80 MG tablet Take 1 tablet (80 mg total) by mouth daily at 6 PM. 90 tablet 3  . Docusate Calcium (STOOL SOFTENER PO) Take 2 capsules by mouth at bedtime.    . dofetilide (TIKOSYN) 250 MCG capsule Take 1 capsule (250 mcg total) by mouth 2 (two) times daily. 60 capsule 2  . DULoxetine (CYMBALTA) 30 MG capsule Take 30 mg by mouth at bedtime.     . magnesium oxide (MAG-OX) 400 (241.3 MG) MG tablet Take 1 tablet (400 mg total) by mouth daily. 30 tablet 11  . metoprolol succinate (TOPROL-XL) 50 MG 24 hr tablet Take 1 tablet (50 mg total) by mouth  daily. Take with or immediately following a meal. 90 tablet 3  . Multiple Vitamin (MULTIVITAMIN WITH MINERALS) TABS tablet Take 1 tablet by mouth every evening.     . nitroGLYCERIN (NITROSTAT) 0.4 MG SL tablet PLACE 1 TABLET UNDER TONGUE EVERY 5 MINUTES AS NEEDED FOR CHEST PAIN MAX 3 TABLETS 25 tablet 3  . NON FORMULARY at bedtime. CPAP    . pantoprazole (PROTONIX) 40 MG tablet Take 1 tablet (40 mg total) by mouth daily. NEED OV. 30 tablet 0  . potassium chloride (K-DUR) 10 MEQ tablet TAKE 1 TABLET(10 MEQ) BY MOUTH DAILY 30 tablet 6  . XARELTO 20 MG TABS tablet TAKE 1 TABLET (20 MG TOTAL) BY MOUTH DAILY WITH SUPPER. 30 tablet 5   No current facility-administered medications on file prior to visit.     Allergies:  No Known Allergies  Physical Exam General: well developed, well nourished, seated, in no evident distress Head: head normocephalic and atraumatic.   Neck: supple with no carotid or supraclavicular bruits Cardiovascular: regular rate and rhythm, no murmurs Musculoskeletal: no deformity Skin:  no rash/petichiae. Examination of the right ear does not show any herpetic vesicles. No redness noted in the conjunctiva in the right eye Vascular:  Normal pulses all extremities  Neurologic Exam Mental Status: Awake and fully alert. Oriented to place and time. Recent and remote memory intact. Attention span, concentration and fund of knowledge appropriate. Mood and affect appropriate.  Cranial Nerves: Fundoscopic exam reveals sharp disc margins. Pupils equal, briskly reactive to light. Extraocular movements full without nystagmus. Visual fields full to confrontation. Hearing Adequate induction diminished in the right ear. Right peripheral facial nerve weakness involving forehead, eye closure as well as cheek muscles. Facial sensation is intact bilaterally. No hyperacusis. No facial dyskinesias noted Diminished taste sensation on the right half of the tongue. Face, tongue, palate moves normally  and symmetrically.  Motor: Normal bulk and tone. Normal strength in all tested extremity muscles. Sensory.: intact to touch , pinprick , position and vibratory sensation.  Coordination: Rapid alternating movements normal in all extremities. Finger-to-nose and heel-to-shin performed accurately bilaterally. Gait and Station: Arises from chair without difficulty. Stance is normal. Gait demonstrates normal stride length and balance . Able to heel, toe and tandem walk without difficulty.  Reflexes: 1+ and symmetric. Toes downgoing.       ASSESSMENT: 56 year old Caucasian male with a right peripheral facial nerve paralysis likely due to Bell's palsy. Multiple medical problems including coronary artery disease, chronic atrial fibrillation, arthritis    PLAN: I had a long discussion the patient with regards to his right-sided Bell's palsy and expect gradual improvement over  the next few months. I recommend he continue to use artificial tears liberally during the day and to use Lacri-Lube ointment at night when he sleeps. Discontinue gabapentin and changed to Topamax 50 mg daily for 1 week to be increased to twice daily to help with his pain. Check MRI scan of the brain with and without contrast to rule out structural lesions involving the facial nerve He may return to work but to increase his work gradually. Greater than 50% time during this 45 minute consultation visit was spent on counseling and coordination of care about facial weakness, Bell's palsy and answering questions Return for follow-up to see me in 6 weeks or call earlier if necessary.    Antony Contras, MD  [facility] [address] [place], [place] [address]  Phone [phone] Fax [phone]  Note: This document was prepared with digital dictation and possible smart phrase technology. Any transcriptional errors that result from this process are unintentional.

## 2016-03-05 ENCOUNTER — Other Ambulatory Visit: Payer: Self-pay

## 2016-03-07 ENCOUNTER — Other Ambulatory Visit: Payer: Self-pay

## 2016-03-07 MED ORDER — DOFETILIDE 250 MCG PO CAPS: 250.0000 ug | ORAL_CAPSULE | Freq: Two times a day (BID) | ORAL | 2 refills | Status: DC

## 2016-03-11 ENCOUNTER — Other Ambulatory Visit: Payer: Self-pay | Admitting: Internal Medicine

## 2016-04-30 ENCOUNTER — Ambulatory Visit: Payer: BLUE CROSS/BLUE SHIELD | Admitting: Neurology

## 2016-05-09 ENCOUNTER — Emergency Department (HOSPITAL_COMMUNITY): Payer: BLUE CROSS/BLUE SHIELD

## 2016-05-09 ENCOUNTER — Emergency Department (HOSPITAL_COMMUNITY)
Admission: EM | Admit: 2016-05-09 | Discharge: 2016-05-09 | Disposition: A | Discharge status: Home / Self Care | Payer: BLUE CROSS/BLUE SHIELD | Attending: Emergency Medicine | Admitting: Emergency Medicine

## 2016-05-09 ENCOUNTER — Encounter (HOSPITAL_COMMUNITY): Payer: Self-pay | Admitting: *Deleted

## 2016-05-09 DIAGNOSIS — Z87891 Personal history of nicotine dependence: Secondary | ICD-10-CM | POA: Diagnosis not present

## 2016-05-09 DIAGNOSIS — Z7982 Long term (current) use of aspirin: Secondary | ICD-10-CM | POA: Diagnosis not present

## 2016-05-09 DIAGNOSIS — R51 Headache: Secondary | ICD-10-CM | POA: Diagnosis not present

## 2016-05-09 DIAGNOSIS — D72825 Bandemia: Secondary | ICD-10-CM | POA: Insufficient documentation

## 2016-05-09 DIAGNOSIS — Z955 Presence of coronary angioplasty implant and graft: Secondary | ICD-10-CM | POA: Insufficient documentation

## 2016-05-09 DIAGNOSIS — I2510 Atherosclerotic heart disease of native coronary artery without angina pectoris: Secondary | ICD-10-CM | POA: Diagnosis not present

## 2016-05-09 DIAGNOSIS — I252 Old myocardial infarction: Secondary | ICD-10-CM | POA: Insufficient documentation

## 2016-05-09 DIAGNOSIS — Z7901 Long term (current) use of anticoagulants: Secondary | ICD-10-CM | POA: Diagnosis not present

## 2016-05-09 DIAGNOSIS — I10 Essential (primary) hypertension: Secondary | ICD-10-CM | POA: Insufficient documentation

## 2016-05-09 DIAGNOSIS — R0602 Shortness of breath: Secondary | ICD-10-CM | POA: Diagnosis present

## 2016-05-09 DIAGNOSIS — Z79899 Other long term (current) drug therapy: Secondary | ICD-10-CM | POA: Insufficient documentation

## 2016-05-09 DIAGNOSIS — R8271 Bacteriuria: Secondary | ICD-10-CM

## 2016-05-09 DIAGNOSIS — M791 Myalgia, unspecified site: Secondary | ICD-10-CM

## 2016-05-09 DIAGNOSIS — R519 Headache, unspecified: Secondary | ICD-10-CM

## 2016-05-09 LAB — CBC WITH DIFFERENTIAL/PLATELET
Basophils Absolute: 0 10*3/uL (ref 0.0–0.1)
Basophils Relative: 0 %
Eosinophils Absolute: 0.1 10*3/uL (ref 0.0–0.7)
Eosinophils Relative: 0 %
HCT: 40.8 % (ref 39.0–52.0)
Hemoglobin: 13.9 g/dL (ref 13.0–17.0)
LYMPHS PCT: 15 %
Lymphs Abs: 2.9 10*3/uL (ref 0.7–4.0)
MCH: 31 pg (ref 26.0–34.0)
MCHC: 34.1 g/dL (ref 30.0–36.0)
MCV: 91.1 fL (ref 78.0–100.0)
MONO ABS: 1.8 10*3/uL — AB (ref 0.1–1.0)
MONOS PCT: 9 %
NEUTROS ABS: 14.8 10*3/uL — AB (ref 1.7–7.7)
Neutrophils Relative %: 76 %
Platelets: 181 10*3/uL (ref 150–400)
RBC: 4.48 MIL/uL (ref 4.22–5.81)
RDW: 15 % (ref 11.5–15.5)
WBC: 19.6 10*3/uL — ABNORMAL HIGH (ref 4.0–10.5)

## 2016-05-09 LAB — URINALYSIS, ROUTINE W REFLEX MICROSCOPIC
BILIRUBIN URINE: NEGATIVE
GLUCOSE, UA: NEGATIVE mg/dL
KETONES UR: NEGATIVE mg/dL
NITRITE: NEGATIVE
PH: 5 (ref 5.0–8.0)
PROTEIN: NEGATIVE mg/dL
Specific Gravity, Urine: 1.014 (ref 1.005–1.030)

## 2016-05-09 LAB — COMPREHENSIVE METABOLIC PANEL
ALT: 16 U/L — ABNORMAL LOW (ref 17–63)
ANION GAP: 11 (ref 5–15)
AST: 23 U/L (ref 15–41)
Albumin: 3.7 g/dL (ref 3.5–5.0)
Alkaline Phosphatase: 77 U/L (ref 38–126)
BUN: 11 mg/dL (ref 6–20)
CALCIUM: 8.6 mg/dL — AB (ref 8.9–10.3)
CO2: 14 mmol/L — AB (ref 22–32)
CREATININE: 0.88 mg/dL (ref 0.61–1.24)
Chloride: 111 mmol/L (ref 101–111)
GLUCOSE: 140 mg/dL — AB (ref 65–99)
POTASSIUM: 4.1 mmol/L (ref 3.5–5.1)
Sodium: 136 mmol/L (ref 135–145)
Total Bilirubin: 0.9 mg/dL (ref 0.3–1.2)
Total Protein: 7.1 g/dL (ref 6.5–8.1)

## 2016-05-09 LAB — I-STAT TROPONIN, ED: TROPONIN I, POC: 0 ng/mL (ref 0.00–0.08)

## 2016-05-09 LAB — PROTEIN, CSF: Total  Protein, CSF: 33 mg/dL (ref 15–45)

## 2016-05-09 LAB — CSF CELL COUNT WITH DIFFERENTIAL
RBC COUNT CSF: 6 /mm3 — AB
RBC Count, CSF: 0 /mm3
TUBE #: 4
Tube #: 1
WBC CSF: 0 /mm3 (ref 0–5)
WBC, CSF: 1 /mm3 (ref 0–5)

## 2016-05-09 LAB — GLUCOSE, CSF: GLUCOSE CSF: 81 mg/dL — AB (ref 40–70)

## 2016-05-09 LAB — CK: Total CK: 98 U/L (ref 49–397)

## 2016-05-09 MED ORDER — ACETAMINOPHEN 500 MG PO TABS
1000.0000 mg | ORAL_TABLET | Freq: Once | ORAL | Status: AC
  Administered 2016-05-09: 1000 mg via ORAL
  Filled 2016-05-09: qty 2

## 2016-05-09 MED ORDER — CEPHALEXIN 500 MG PO CAPS: 500.0000 mg | ORAL_CAPSULE | Freq: Three times a day (TID) | ORAL | 0 refills | Status: AC

## 2016-05-09 MED ORDER — PROCHLORPERAZINE EDISYLATE 5 MG/ML IJ SOLN
10.0000 mg | Freq: Once | INTRAMUSCULAR | Status: AC
  Administered 2016-05-09: 10 mg via INTRAVENOUS
  Filled 2016-05-09: qty 2

## 2016-05-09 MED ORDER — ACYCLOVIR 200 MG PO CAPS
800.0000 mg | ORAL_CAPSULE | Freq: Once | ORAL | Status: AC
  Administered 2016-05-09: 800 mg via ORAL
  Filled 2016-05-09: qty 4

## 2016-05-09 MED ORDER — DEXTROSE 5 % IV SOLN
1.0000 g | Freq: Once | INTRAVENOUS | Status: AC
  Administered 2016-05-09: 1 g via INTRAVENOUS
  Filled 2016-05-09: qty 10

## 2016-05-09 MED ORDER — SODIUM CHLORIDE 0.9 % IV BOLUS (SEPSIS)
1000.0000 mL | Freq: Once | INTRAVENOUS | Status: AC
  Administered 2016-05-09: 1000 mL via INTRAVENOUS

## 2016-05-09 MED ORDER — ACYCLOVIR 800 MG PO TABS: 800.0000 mg | ORAL_TABLET | Freq: Every day | ORAL | 0 refills | Status: AC

## 2016-05-09 NOTE — ED Triage Notes (Signed)
To ED for eval of generalized pain and sob for the past 2 days. States he just returned from a business trip in [place]. Traveled via plane. States general pain will 'every once in a while pinpoint to my chest and other areas'. No distress noted. Hx noted. States his head hurts so bad he can't laugh or cough without feeling like his head is going to explode.

## 2016-05-09 NOTE — ED Provider Notes (Addendum)
[facility] DEPT Provider Note   CSN: [id number] Arrival date & time: 05/09/16  1448     History   Chief Complaint Chief Complaint  Patient presents with  . Shortness of Breath    HPI Paul Todd is a 56 y.o. male.  The history is provided by the patient.  Influenza  Presenting symptoms: fatigue, fever (subjective), headache, myalgias and shortness of breath   Presenting symptoms: no cough, no diarrhea, no nausea, no rhinorrhea and no vomiting   Severity:  Severe Onset quality:  Gradual Duration:  2 days Progression:  Waxing and waning Chronicity:  New Relieved by:  Nothing Worsened by:  Nothing Associated symptoms: chills      Patient also reported sporadic, brief, sharp chest pain last of which was felt last night. No associated diaphoresis, nausea, radiation, shortness of breath. Patient reports past medical history of MIs but states this is different than heart attack pain.   Past Medical History:  Diagnosis Date  . Arthritis       . CAD (coronary artery disease)    multivessel per cath 02/12/13, s/p stenting to RCA  . Chronic lower back pain   . Dyslipidemia   . Hx of Bell's palsy   . Hypertension   . Myocardial infarction ([place]) 02/12/2013   . Nephrolithiasis   . OSA on CPAP   . PE (pulmonary thromboembolism) ([place]) 02/2014  . Persistent atrial fibrillation [facility])         Patient Active Problem List   Diagnosis Date Noted  . Pre-operative cardiovascular examination 08/23/2015  . PVC (premature ventricular contraction) 07/24/2014  . Encounter for cardioversion procedure   . Persistent atrial fibrillation ([place])   . Pulmonary embolus ([place]) 06/20/2014  . Back pain 02/21/2014  . Chest pain 09/05/2013  . Nephrolithiasis 09/05/2013  . Hematuria, gross 09/05/2013  . Nausea and vomiting 09/05/2013  . Nausea & vomiting   . Old myocardial infarction   . Emesis 09/02/2013  . OSA (obstructive sleep apnea) 07/22/2013  . Obesity (BMI 35.0-39.9 without  comorbidity) 03/07/2013  . Unstable angina - Post Infarct Angina with existing LAD disease 02/14/2013  . CAD S/P percutaneous coronary angioplasty - DES to Mid LAD; 2x 87mm x 38 mm Xience DES to RCA 02/14/2013  . Dyslipidemia 02/12/2013  . NSTEMI (non-ST elevated myocardial infarction) ([place]) 01/30/2013  . Tobacco abuse 01/30/2013  . Fatigue 01/30/2013  . PAF (paroxysmal atrial fibrillation) ([place]) 01/30/2013    Past Surgical History:  Procedure Laterality Date  . CARDIOVERSION N/A 02/15/2013   Procedure: CARDIOVERSION;  Surgeon: Thayer Headings, MD;  Location: [facility];  Service: Cardiovascular;  Laterality: N/A;  . CARDIOVERSION N/A 07/07/2014   Procedure: CARDIOVERSION;  Surgeon: Pixie Casino, MD;  Location: [place];  Service: Cardiovascular;  Laterality: N/A;  . CORONARY ANGIOPLASTY WITH STENT PLACEMENT  02/12/2013  . CYSTOSCOPY WITH RETROGRADE PYELOGRAM, URETEROSCOPY AND STENT PLACEMENT Left 09/02/2013   Procedure: CYSTOSCOPY WITH RETROGRADE PYELOGRAM/LEFT URETEROSCOPY/URETERAL STENT, with fulguration;  Surgeon: Festus Aloe, MD;  Location: [facility];  Service: Urology;  Laterality: Left;  . CYSTOSCOPY WITH URETEROSCOPY AND STENT PLACEMENT Left 09/16/2013   Procedure: CYSTOSCOPY WITH URETEROSCOPY AND STENT PLACEMENT;  Surgeon: Festus Aloe, MD;  Location: [facility];  Service: Urology;  Laterality: Left;  . HOLMIUM LASER APPLICATION Left 1/85/6314   Procedure: HOLMIUM LASER APPLICATION;  Surgeon: Festus Aloe, MD;  Location: [facility];  Service: Urology;  Laterality: Left;  . LEFT HEART CATHETERIZATION WITH CORONARY ANGIOGRAM N/A 02/12/2013   Procedure: LEFT  HEART CATHETERIZATION WITH CORONARY ANGIOGRAM;  Surgeon: Troy Sine, MD;  Location: [facility] [place] CATH LAB;  Service: Cardiovascular;  Laterality: N/A;  . PERCUTANEOUS CORONARY STENT INTERVENTION (PCI-S)  02/12/2013   Procedure: PERCUTANEOUS CORONARY STENT INTERVENTION (PCI-S);  Surgeon: Troy Sine, MD;  Location: [facility] CATH LAB;   Service: Cardiovascular;;  STEMI  . PERCUTANEOUS CORONARY STENT INTERVENTION (PCI-S) N/A 02/14/2013   Procedure: PERCUTANEOUS CORONARY STENT INTERVENTION (PCI-S);  Surgeon: Burnell Blanks, MD;  Location: [facility] CATH LAB;  Service: Cardiovascular;  Laterality: N/A;  Proximal LAD  . TEE WITHOUT CARDIOVERSION N/A 02/15/2013   Procedure: TRANSESOPHAGEAL ECHOCARDIOGRAM (TEE);  Surgeon: Thayer Headings, MD;  Location: [place];  Service: Cardiovascular;  Laterality: N/A;    OB History    No data available       Home Medications    Prior to Admission medications   Medication Sig Start Date End Date Taking? Authorizing Provider  acetaminophen (TYLENOL) 650 MG CR tablet Take 650 mg by mouth every 8 (eight) hours as needed for pain.   Yes [provider]  aspirin EC 81 MG tablet Take 81 mg by mouth daily.   Yes [provider]  atorvastatin (LIPITOR) 80 MG tablet Take 1 tablet (80 mg total) by mouth daily at 6 PM. 06/05/15  Yes Hilty, Nadean Corwin, MD  Docusate Calcium (STOOL SOFTENER PO) Take 2 capsules by mouth at bedtime.   Yes [provider]  dofetilide (TIKOSYN) 250 MCG capsule Take 1 capsule (250 mcg total) by mouth 2 (two) times daily. 03/07/16  Yes Hilty, Nadean Corwin, MD  DULoxetine (CYMBALTA) 30 MG capsule Take 60 mg by mouth at bedtime.  12/24/13  Yes [provider]  magnesium oxide (MAG-OX) 400 (241.3 MG) MG tablet Take 1 tablet (400 mg total) by mouth daily. 07/25/14  Yes Seiler, Luetta Nutting K, NP  metoprolol succinate (TOPROL-XL) 50 MG 24 hr tablet Take 1 tablet (50 mg total) by mouth daily. Take with or immediately following a meal. 06/26/15  Yes Hilty, Nadean Corwin, MD  Multiple Vitamin (MULTIVITAMIN WITH MINERALS) TABS tablet Take 1 tablet by mouth every evening.    Yes [provider]  nitroGLYCERIN (NITROSTAT) 0.4 MG SL tablet PLACE 1 TABLET UNDER TONGUE EVERY 5 MINUTES AS NEEDED FOR CHEST PAIN MAX 3 TABLETS 01/08/16  Yes Hilty, Nadean Corwin, MD  NON  FORMULARY at bedtime. CPAP   Yes [provider]  pantoprazole (PROTONIX) 40 MG tablet Take 1 tablet (40 mg total) by mouth daily. 03/11/16  Yes Hilty, Nadean Corwin, MD  potassium chloride (K-DUR) 10 MEQ tablet TAKE 1 TABLET(10 MEQ) BY MOUTH DAILY 10/01/15  Yes Sherran Needs, NP  XARELTO 20 MG TABS tablet TAKE 1 TABLET (20 MG TOTAL) BY MOUTH DAILY WITH SUPPER. 12/28/15  Yes Hilty, Nadean Corwin, MD  acyclovir (ZOVIRAX) 800 MG tablet Take 1 tablet (800 mg total) by mouth 5 (five) times daily. 05/09/16 05/16/16  Fatima Blank, MD  cephALEXin (KEFLEX) 500 MG capsule Take 1 capsule (500 mg total) by mouth 3 (three) times daily. 05/09/16 05/16/16  Fatima Blank, MD  topiramate (TOPAMAX) 50 MG tablet Take 1 tablet (50 mg total) by mouth 2 (two) times daily. Start 1 tablet daily x 1 week then twice daily Patient not taking: Reported on 05/09/2016 02/20/16 02/19/17  Garvin Fila, MD    Family History Family History  Problem Relation Age of Onset  . Heart disease Mother   . Diabetes Mother   . Stroke Mother   .  Diabetes Father     Social History Social History  Substance Use Topics  . Smoking status: Former Smoker    Packs/day: 0.50    Years: 35.00    Types: Cigarettes    Quit date: 02/12/2013  . Smokeless tobacco: Never Used  . Alcohol use No     Comment: 07/21/2014 "never drank much; might have a couple drinks/yr since MI 02/2013"     Allergies   Patient has no known allergies.   Review of Systems Review of Systems  Constitutional: Positive for chills, fatigue and fever (subjective).  HENT: Negative for rhinorrhea.   Respiratory: Positive for shortness of breath. Negative for cough.   Gastrointestinal: Negative for diarrhea, nausea and vomiting.  Genitourinary: Negative for dysuria.  Musculoskeletal: Positive for myalgias.  Neurological: Positive for headaches.  All other systems are reviewed and are negative for acute change except as noted in the HPI    Physical  Exam Updated Vital Signs BP (!) 153/78 (BP Location: Left Arm)   Pulse 95   Temp 98.1 F (36.7 C) (Oral)   Resp 14   SpO2 96%   Physical Exam  Constitutional: He is oriented to person, place, and time. He appears well-developed and well-nourished. No distress.  HENT:  Head: Normocephalic and atraumatic.  Nose: Nose normal.  Eyes: Conjunctivae and EOM are normal. Pupils are equal, round, and reactive to light. Right eye exhibits no discharge. Left eye exhibits no discharge. No scleral icterus.  Neck: Normal range of motion. Neck rigidity present.  Cardiovascular: Normal rate and regular rhythm.  Exam reveals no gallop and no friction rub.   No murmur heard. Pulmonary/Chest: Effort normal and breath sounds normal. No stridor. No respiratory distress. He has no rales.  Abdominal: Soft. He exhibits no distension. There is no tenderness.  Musculoskeletal: He exhibits no edema or tenderness.  Neurological: He is alert and oriented to person, place, and time.  Skin: Skin is warm and dry. No rash noted. He is not diaphoretic. No erythema.  Psychiatric: He has a normal mood and affect.  Vitals reviewed.    ED Treatments / Results  Labs (all labs ordered are listed, but only abnormal results are displayed) Labs Reviewed  CBC WITH DIFFERENTIAL/PLATELET - Abnormal; Notable for the following:       Result Value   WBC 19.6 (*)    Neutro Abs 14.8 (*)    Monocytes Absolute 1.8 (*)    All other components within normal limits  COMPREHENSIVE METABOLIC PANEL - Abnormal; Notable for the following:    CO2 14 (*)    Glucose, Bld 140 (*)    Calcium 8.6 (*)    ALT 16 (*)    All other components within normal limits  CSF CELL COUNT WITH DIFFERENTIAL - Abnormal; Notable for the following:    RBC Count, CSF 6 (*)    All other components within normal limits  GLUCOSE, CSF - Abnormal; Notable for the following:    Glucose, CSF 81 (*)    All other components within normal limits  URINALYSIS,  ROUTINE W REFLEX MICROSCOPIC - Abnormal; Notable for the following:    APPearance CLOUDY (*)    Hgb urine dipstick SMALL (*)    Leukocytes, UA LARGE (*)    Bacteria, UA MANY (*)    Squamous Epithelial / LPF 0-5 (*)    All other components within normal limits  CSF CULTURE  CK  CSF CELL COUNT WITH DIFFERENTIAL  PROTEIN, CSF  HERPES SIMPLEX VIRUS(HSV)  DNA BY PCR  I-STAT TROPOININ, ED    EKG  EKG Interpretation  Date/Time:  Friday May 09 2016 08:57:09 EDT Ventricular Rate:  93 PR Interval:  172 QRS Duration: 94 QT Interval:  326 QTC Calculation: 405 R Axis:   72 Text Interpretation:  Normal sinus rhythm Anterior infarct , age undetermined Abnormal ECG No significant change since last tracing Confirmed by [facility] MD, [place] [phone]) on 05/09/2016 10:54:06 AM       Radiology Dg Chest 2 View  Result Date: 05/09/2016 CLINICAL DATA:  Shortness of breath, chest pain. EXAM: CHEST  2 VIEW COMPARISON:  Radiographs of September 05, 2013. FINDINGS: The heart size and mediastinal contours are within normal limits. Both lungs are clear. No pneumothorax or pleural effusion is noted. The visualized skeletal structures are unremarkable. IMPRESSION: No active cardiopulmonary disease. Electronically Signed   By: Marijo Conception, M.D.   On: 05/09/2016 09:29    Procedures .Lumbar Puncture Date/Time: 05/09/2016 10:00 AM Performed by: Fatima Blank Authorized by: Fatima Blank   Consent:    Consent obtained:  Written   Consent given by:  Patient   Risks discussed:  Bleeding, headache, infection, pain and repeat procedure   Alternatives discussed:  Delayed treatment Pre-procedure details:    Procedure purpose:  Diagnostic   Preparation: Patient was prepped and draped in usual sterile fashion   Anesthesia (see MAR for exact dosages):    Anesthesia method:  Local infiltration   Local anesthetic:  Lidocaine 1% WITH epi Procedure details:    Lumbar space:  L4-L5 interspace   Patient  position:  Sitting   Needle gauge:  18   Needle type:  Spinal needle - Quincke tip   Needle length (in):  5.0   Ultrasound guidance: yes     Number of attempts:  1   Fluid appearance:  Clear   Tubes of fluid:  4   Total volume (ml):  7 Post-procedure:    Puncture site:  Adhesive bandage applied   Patient tolerance of procedure:  Tolerated well, no immediate complications   (including critical care time)  Medications Ordered in ED Medications  sodium chloride 0.9 % bolus 1,000 mL (0 mLs Intravenous Stopped 05/09/16 1402)  acetaminophen (TYLENOL) tablet 1,000 mg (1,000 mg Oral Given 05/09/16 1308)  prochlorperazine (COMPAZINE) injection 10 mg (10 mg Intravenous Given 05/09/16 1308)  cefTRIAXone (ROCEPHIN) 1 g in dextrose 5 % 50 mL IVPB (0 g Intravenous Stopped 05/09/16 1627)  acyclovir (ZOVIRAX) 200 MG capsule 800 mg (800 mg Oral Given 05/09/16 1805)     Initial Impression / Assessment and Plan / ED Course  I have reviewed the triage vital signs and the nursing notes.  Pertinent labs & imaging results that were available during my care of the patient were reviewed by me and considered in my medical decision making (see chart for details).    Patient could have viral process however no other infectious symptoms were identified on history.  Given the patient's history of HSV, now with severe headache, nuchal rigidity, and myalgias, there was a concern for possible viral meningitis. Lumbar puncture was performed and CSF analysis was sent off which was not consistent with meningitis. HSV PCR was sent however he will not return for 3 days. Will impair clinically treat patient with acyclovir. We'll have patient follow up with primary care provider who patient reports is within the [facility] system and should have access to records. If the HSV PCR is negative acyclovir could be  discontinued.  UA was obtained given the leukocytosis with left shift, which revealed bacteria and leukocytes. Patient was  treated tympanically with Rocephin and will be given a prescription for Keflex. Urine culture was sent out and can be followed up by primary care provider for speciation and sensitivities.  Chest x-ray was negative for pneumonia and patient is not having any respiratory symptoms. The patient did not have any abdominal tenderness to his just any intra-abdominal infectious process.  Patient was treated symptomatically and provided with IV fluids.  Felt to be safe for discharge with strict return precautions and close PCP follow-up.    Final Clinical Impressions(s) / ED Diagnoses   Final diagnoses:  Myalgia  Bad headache  Bacteriuria  Bandemia   Disposition: Discharge  Condition: Good  I have discussed the results, Dx and Tx plan with the patient who expressed understanding and agree(s) with the plan. Discharge instructions discussed at great length. The patient was given strict return precautions who verbalized understanding of the instructions. No further questions at time of discharge.    Discharge Medication List as of 05/09/2016  5:46 PM    START taking these medications   Details  acyclovir (ZOVIRAX) 800 MG tablet Take 1 tablet (800 mg total) by mouth 5 (five) times daily., Starting Fri 05/09/2016, Until Fri 05/16/2016, Print    cephALEXin (KEFLEX) 500 MG capsule Take 1 capsule (500 mg total) by mouth 3 (three) times daily., Starting Fri 05/09/2016, Until Fri 05/16/2016, Print        Follow Up: Everardo Beals, NP [facility] [place] [address] [phone]  Schedule an appointment as soon as possible for a visit on 05/12/2016 in 3-5 days for close follow up        Fatima Blank, MD 05/09/16 1851

## 2016-05-09 NOTE — ED Notes (Signed)
Pt instructed to lay flat on the stretcher for one hour post lumbar puncture. Pt currently flat in bed, NAD. Blankets provided. Pt informed a urine specimen is needed and urinal provided.

## 2016-05-11 LAB — HERPES SIMPLEX VIRUS(HSV) DNA BY PCR
HSV 1 DNA: NEGATIVE
HSV 2 DNA: NEGATIVE

## 2016-05-12 LAB — CSF CULTURE

## 2016-05-12 LAB — CSF CULTURE W GRAM STAIN: Culture: NO GROWTH

## 2016-05-16 ENCOUNTER — Other Ambulatory Visit: Payer: Self-pay | Admitting: Neurology

## 2016-05-24 ENCOUNTER — Other Ambulatory Visit: Payer: Self-pay | Admitting: Internal Medicine

## 2016-05-25 ENCOUNTER — Other Ambulatory Visit: Payer: Self-pay | Admitting: Internal Medicine

## 2016-05-26 ENCOUNTER — Other Ambulatory Visit (HOSPITAL_COMMUNITY): Payer: Self-pay | Admitting: Nurse Practitioner

## 2016-05-26 NOTE — Telephone Encounter (Signed)
Rx(s) sent to pharmacy electronically.  

## 2016-05-26 NOTE — Telephone Encounter (Signed)
Rx request sent to pharmacy.  

## 2016-06-18 ENCOUNTER — Other Ambulatory Visit: Payer: Self-pay | Admitting: Internal Medicine

## 2016-06-18 NOTE — Telephone Encounter (Signed)
Rx(s) sent to pharmacy electronically.  

## 2016-06-21 ENCOUNTER — Other Ambulatory Visit: Payer: Self-pay | Admitting: Internal Medicine

## 2016-06-24 ENCOUNTER — Other Ambulatory Visit: Payer: Self-pay | Admitting: Internal Medicine

## 2016-07-01 ENCOUNTER — Other Ambulatory Visit (HOSPITAL_COMMUNITY): Payer: Self-pay | Admitting: Nurse Practitioner

## 2016-07-01 ENCOUNTER — Other Ambulatory Visit: Payer: Self-pay | Admitting: Internal Medicine

## 2016-07-19 ENCOUNTER — Other Ambulatory Visit: Payer: Self-pay | Admitting: Internal Medicine

## 2016-08-21 ENCOUNTER — Other Ambulatory Visit: Payer: Self-pay | Admitting: Internal Medicine

## 2016-09-17 ENCOUNTER — Other Ambulatory Visit: Payer: Self-pay | Admitting: Internal Medicine

## 2016-09-19 ENCOUNTER — Telehealth: Payer: Self-pay

## 2016-09-19 ENCOUNTER — Other Ambulatory Visit: Payer: Self-pay | Admitting: Internal Medicine

## 2016-09-19 NOTE — Telephone Encounter (Signed)
I have completed a PA form for Dofetilide that was faxed to [place] from [place]. I have faxed completed form back to [facility].

## 2016-09-19 NOTE — Telephone Encounter (Signed)
**Note De-Identified Makinlee Awwad Obfuscation** Dofetilide PA has been approved through [facility]. Approval good from 09/19/16 until 09/20/2018

## 2016-09-24 ENCOUNTER — Other Ambulatory Visit (HOSPITAL_COMMUNITY): Payer: Self-pay | Admitting: Internal Medicine

## 2016-10-03 ENCOUNTER — Other Ambulatory Visit: Payer: Self-pay | Admitting: Internal Medicine

## 2016-10-09 ENCOUNTER — Other Ambulatory Visit: Payer: Self-pay | Admitting: Orthopedic Surgery

## 2016-10-09 DIAGNOSIS — M545 Low back pain, unspecified: Secondary | ICD-10-CM

## 2016-10-09 DIAGNOSIS — M48061 Spinal stenosis, lumbar region without neurogenic claudication: Secondary | ICD-10-CM

## 2016-10-09 DIAGNOSIS — M7541 Impingement syndrome of right shoulder: Secondary | ICD-10-CM | POA: Diagnosis not present

## 2016-10-10 ENCOUNTER — Other Ambulatory Visit: Payer: Self-pay | Admitting: Internal Medicine

## 2016-10-10 NOTE — Telephone Encounter (Signed)
Rx(s) sent to pharmacy electronically.  

## 2016-10-16 ENCOUNTER — Ambulatory Visit
Admission: RE | Admit: 2016-10-16 | Discharge: 2016-10-16 | Disposition: A | Discharge status: Home / Self Care | Payer: BLUE CROSS/BLUE SHIELD | Source: Ambulatory Visit | Attending: Orthopedic Surgery | Admitting: Orthopedic Surgery

## 2016-10-16 ENCOUNTER — Other Ambulatory Visit: Payer: Self-pay | Admitting: Internal Medicine

## 2016-10-16 DIAGNOSIS — M545 Low back pain, unspecified: Secondary | ICD-10-CM

## 2016-10-16 DIAGNOSIS — M5126 Other intervertebral disc displacement, lumbar region: Secondary | ICD-10-CM | POA: Diagnosis not present

## 2016-10-16 DIAGNOSIS — M48061 Spinal stenosis, lumbar region without neurogenic claudication: Secondary | ICD-10-CM

## 2016-10-16 MED ORDER — GADOBENATE DIMEGLUMINE 529 MG/ML IV SOLN: 20.0000 mL | Freq: Once | INTRAVENOUS | Status: DC | PRN

## 2016-10-22 ENCOUNTER — Other Ambulatory Visit: Payer: Self-pay

## 2016-10-24 ENCOUNTER — Other Ambulatory Visit (HOSPITAL_COMMUNITY): Payer: Self-pay | Admitting: Internal Medicine

## 2016-10-26 ENCOUNTER — Other Ambulatory Visit: Payer: Self-pay

## 2016-11-13 ENCOUNTER — Other Ambulatory Visit: Payer: Self-pay | Admitting: Internal Medicine

## 2016-11-19 DIAGNOSIS — I10 Essential (primary) hypertension: Secondary | ICD-10-CM | POA: Diagnosis not present

## 2016-11-19 DIAGNOSIS — J209 Acute bronchitis, unspecified: Secondary | ICD-10-CM | POA: Diagnosis not present

## 2016-11-19 DIAGNOSIS — M48061 Spinal stenosis, lumbar region without neurogenic claudication: Secondary | ICD-10-CM | POA: Diagnosis not present

## 2016-11-19 DIAGNOSIS — I739 Peripheral vascular disease, unspecified: Secondary | ICD-10-CM | POA: Diagnosis not present

## 2016-11-19 DIAGNOSIS — Z6841 Body Mass Index (BMI) 40.0 and over, adult: Secondary | ICD-10-CM | POA: Diagnosis not present

## 2016-11-21 ENCOUNTER — Other Ambulatory Visit: Payer: Self-pay | Admitting: Neurosurgery

## 2016-11-21 DIAGNOSIS — I739 Peripheral vascular disease, unspecified: Secondary | ICD-10-CM

## 2016-11-23 ENCOUNTER — Other Ambulatory Visit: Payer: Self-pay | Admitting: Internal Medicine

## 2016-11-24 ENCOUNTER — Encounter: Payer: Self-pay | Admitting: Internal Medicine

## 2016-11-24 ENCOUNTER — Ambulatory Visit (INDEPENDENT_AMBULATORY_CARE_PROVIDER_SITE_OTHER): Payer: BLUE CROSS/BLUE SHIELD | Admitting: Internal Medicine

## 2016-11-24 VITALS — BP 114/70 | HR 61 | Ht 67.0 in | Wt 266.8 lb

## 2016-11-24 DIAGNOSIS — I2510 Atherosclerotic heart disease of native coronary artery without angina pectoris: Secondary | ICD-10-CM | POA: Diagnosis not present

## 2016-11-24 DIAGNOSIS — Z9861 Coronary angioplasty status: Secondary | ICD-10-CM | POA: Diagnosis not present

## 2016-11-24 DIAGNOSIS — E782 Mixed hyperlipidemia: Secondary | ICD-10-CM | POA: Diagnosis not present

## 2016-11-24 DIAGNOSIS — I480 Paroxysmal atrial fibrillation: Secondary | ICD-10-CM | POA: Diagnosis not present

## 2016-11-24 DIAGNOSIS — I255 Ischemic cardiomyopathy: Secondary | ICD-10-CM

## 2016-11-24 DIAGNOSIS — G4733 Obstructive sleep apnea (adult) (pediatric): Secondary | ICD-10-CM

## 2016-11-24 MED ORDER — DOFETILIDE 250 MCG PO CAPS: 250.0000 ug | ORAL_CAPSULE | Freq: Two times a day (BID) | ORAL | 3 refills | Status: DC

## 2016-11-24 MED ORDER — RIVAROXABAN 20 MG PO TABS: 20.0000 mg | ORAL_TABLET | Freq: Every day | ORAL | 3 refills | Status: DC

## 2016-11-24 NOTE — Patient Instructions (Signed)
Your physician wants you to follow-up in: 6 months with Dr. Hilty. You will receive a reminder letter in the mail two months in advance. If you don't receive a letter, please call our office to schedule the follow-up appointment.    

## 2016-11-25 ENCOUNTER — Other Ambulatory Visit: Payer: Self-pay

## 2016-11-25 ENCOUNTER — Encounter: Payer: Self-pay | Admitting: Internal Medicine

## 2016-11-25 DIAGNOSIS — I255 Ischemic cardiomyopathy: Secondary | ICD-10-CM | POA: Insufficient documentation

## 2016-11-25 DIAGNOSIS — E782 Mixed hyperlipidemia: Secondary | ICD-10-CM | POA: Insufficient documentation

## 2016-11-25 NOTE — Progress Notes (Signed)
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 He apparently travels significantly and has a pretty poor diet. He reports drinking about 6 alcoholic beverages per week and smokes about one half pack per day for the past 30 years. Recently, while up in [place], he was suffering from a kidney stone and was noted to have atrial fibrillation. He was unaware of this, and was given medication to slow his heart rate. Apparently he converted back into sinus and was instructed to be on aspirin.  He has recently been describing some chest pain which is substernal and radiates to the jaw and down into his right arm. The symptoms last for about 5-10 minutes and go away at rest, if he has been exerting himself. He also has a history of heart disease both in his mother and a father who had high blood pressure.  In addition he filled out an S4 sleepiness scale today which was scored as 12 suggesting a higher than likely possibility of sleep apnea.  He does report difficulty staying asleep, and feeling drowsy throughout the day with the need to take frequent naps.   Paul Todd returns today for followup of his nuclear stress test which was performed on 02/02/2013. This showed essentially no reversible ischemia, however the EF was reduced at 48%. It is unclear to me whether this is a gating abnormality or perhaps due to long-standing hypertension, undiagnosed sleep apnea, or multivessel coronary disease. He did have the episode of paroxysmal atrial fibrillation and continues to have chest pain which is very concerning for angina. He also had a recent lipid profile demonstrating an LDL of 111 and LDL particle number of 1774.  Triglycerides were elevated at 206.  I recommended starting Lipitor 40 mg daily.  Unfortunately, while undergoing work-up, he presented on  02/12/13 with a NSTEMI. He is followed by Dr Debara Pickett and had recently been seen in the office for chest pain. He underwent a Myoview on 02/02/13 which returned abnormal with an EF of 48%, but there was no ischemia. He was then scheduled for an elective cardiac cath for the next week. However, on 02/12/13 he woke up with SSCP "tightness" associated with diaphoresis, SOB, and radiation to his jaw and right arm and proceeded to the ED. While in the ED the patient developed nausea and was noted to be bradycardic with HR in the 30s and SBP dropped to the 50s. ECG showed complete heart block but with a narrow escape. He was given atropine 0.5 mg with rise in HR to the 70s, NSR. Patient's second troponin returned abnormal at 0.17 and continued to climb >20. His episode of complete heart block was suspected to be vagally mediated from nausea; however, it was thought that RCA ischemia needed to be ruled out completely. It was decided that he proceed with a cardiac cath.   Cardiac cath on 02/12/13 revealed an acutely occluded RCA which was successfully treated with PTCA and diffuse stenting due to RCA stenoses beyond the point of total occlusion. The patient also had high-grade concomitant proximal LAD disease which required staged PCI. Left ventriculography revealed an ejection fraction of ~50%. There was evidence for mild mid and distal inferior hypocontractility as well. The patient developed atrial fibrillation during the procedure which was rate controlled with an IV beta blocker and then a low dose oral agent. He underwent  staged PCI to LAD on 02/14/13 with successful PTCA/DES x 1 to proximal LAD and he was placed on ASA/Brilinta.  It was felt that the patient would not tolerate atrial fibrillation well and underwent successful TEE/DCCV on 02/15/13. He is on triple therapy with coumadin. The patient had some issues with hematuria (likely from foley cath related injury) prompting more serious discission about his anticoagulation  status. Dr. Burt Knack reviewed his options considering his recent MI and multivessel stenting as well as indication for anticoagulation with atrial fib and felt it best that he continue ASA 81 mg, switch brilinta to plavix and continue heparin/warfarin overlap, which would be limited to 3-6 months depending on his clinical course. Of note patient also had some shortness of breath on Brilinta, which was resolved when he was switched to Plavix.  Today he reports doing fairly well other than low back pain. He did have a positive sleep study and still reports difficulty sleeping at night. Although he has not been set with a CPAP machine. We need to try to arrange that. The difficulty may have been affected he lost his insurance. He reports to me that he did however secure a job and will be starting in the next few weeks. He's actually did be working 10 hour days and is concerned about his ability to stay on a seat for that long. He's having problems with his back as mentioned and has seen Dr. Maryjean Ka who is contemplating injections. I had initially cleared him for this however realize that he just had recent stenting and that we would be unable to stop his antiplatelet therapy for at least one year, which is up in February 2016.  I saw Paul Todd back today. He is doing much better. He is now using CPAP and reports some improvement in his sleep. He continues to have problems with back pain which is his main concern. Is now been over a year since he underwent drug-eluting stent placement and I feel comfortable adjusting his antiplatelet medicines. We had previously kept him on Plavix and Zarrella toe due to the high bleeding risk of being on triple therapy. He had a small amount of PAF which was post procedure and has a low CHADSVASC score of 0-1 (possible hypertension). Given his young age and the fact that his atrial fibrillation was mostly procedurally related and related to a kidney stone attack, and the fact  that he is now on CPAP which likely increased his risk of atrial fibrillation, I suspect his burden of age of fibrillation is very low. He is in sinus rhythm today. I would like to discontinue Xarelto and switch him over to aspirin and Plavix. He could of course stop this as needed for back injections which he will would likely pursue within the next couple of weeks.  Paul Todd returns today. Unfortunately, he only been off of Xarelto for a few weeks to allow back injections, but then developed acute chest pain and was found to have acute pulmonary embolus at [facility] by VQ scan. Placed back on Xarelto 15 mg twice a day and is about to complete the 21 day course at that dose at which time he'll switch to the 20 mg daily dose. He reports she's been fatigue but that his chest pains gotten better. His EKG does show that he is back in atrial fibrillation today. At his last office visit he was in sinus rhythm. It may be that is in A. fib and that's why he feels  bad.  Paul Todd returns today for follow-up. His ongoing complaint is continued low back pain. Unfortunately because of recurrent A. fib and pulmonary embolus he has not been able to come off of anticoagulation for having this addressed. At this point he is likely able to go ahead with any workup that he needs regarding his back. He is recently been started on take a 7 and had an uneventful loading in the hospital. He's been maintaining sinus rhythm. He is tolerating Xarelto without bleeding problems. He has had some shortness of breath recently and based on his last TEE prior to cardioversion in 2015 EF was 40-45%. This was in the setting of atrial fibrillation and has not been reassessed. I ordered an echocardiogram which was completed today at the hospital and those results indicated that his EF has improved to 55-60%. This would put him at low risk for surgery.  08/23/2015  Paul Todd returns today for follow-up. He continues to be  struggling with back pain is but is planning to have surgery on September 6. He's here today for preoperative risk assessment. After what he's been through the past year at this point I feel that he is at acceptable risk for surgery. He has completed more than 1 year of anticoagulation for his pulmonary embolus and could be taken off of Xarelto for short period of time. He's also had adequate time for stent healing and aspirin could be discontinued for surgery is well. He reports good control of his A. fib although has some very short episodes of breakthrough lasting more than 10-15 seconds but perhaps he's had only 2 or 3 of those events since starting on the T consent. He's also concerned about continued weight gain. Weight now is 255 with a BMI 48. He thinks this could be related to medicines however it may be more likely related to inactivity and he also is reporting fatigue and sluggishness, symptoms prior to when he started on CPAP, but I suspect this is also related to narcotics and/or his Cymbalta.  11/25/2016  Paul Todd returns today for follow-up.  He reports persistent low back pain.  He underwent surgery and now is contemplating another surgery.  He is gotten a second opinion after his symptoms have not significantly improved.  Apparently there is some instability in his vertebrae may need spinal fusion.  He denies any chest pain or worsening shortness of breath.  He does get some peripheral numbness and tingling which is suggestive of neuropathy.  Blood pressure is well-controlled today 114/70.  He is maintained on dofetilide and his EKG shows sinus rhythm with a QTC of 370 ms.  He takes Xarelto for anticoagulation and has had no bleeding complications with that.  He is also on low-dose aspirin for coronary disease.  PMHx:  Past Medical History:  Diagnosis Date  . Arthritis       . CAD (coronary artery disease)    multivessel per cath 02/12/13, s/p stenting to RCA  . Chronic lower back  pain   . Dyslipidemia   . Hx of Bell's palsy   . Hypertension   . Myocardial infarction ([place]) 02/12/2013   . Nephrolithiasis   . OSA on CPAP   . PE (pulmonary thromboembolism) ([place]) 02/2014  . Persistent atrial fibrillation [facility])         Past Surgical History:  Procedure Laterality Date  . CARDIOVERSION N/A 02/15/2013   Procedure: CARDIOVERSION;  Surgeon: Thayer Headings, MD;  Location: [place];  Service: Cardiovascular;  Laterality: N/A;  . CARDIOVERSION N/A 07/07/2014   Procedure: CARDIOVERSION;  Surgeon: Pixie Casino, MD;  Location: [place];  Service: Cardiovascular;  Laterality: N/A;  . CORONARY ANGIOPLASTY WITH STENT PLACEMENT  02/12/2013  . CYSTOSCOPY WITH RETROGRADE PYELOGRAM, URETEROSCOPY AND STENT PLACEMENT Left 09/02/2013   Procedure: CYSTOSCOPY WITH RETROGRADE PYELOGRAM/LEFT URETEROSCOPY/URETERAL STENT, with fulguration;  Surgeon: Festus Aloe, MD;  Location: [facility];  Service: Urology;  Laterality: Left;  . CYSTOSCOPY WITH URETEROSCOPY AND STENT PLACEMENT Left 09/16/2013   Procedure: CYSTOSCOPY WITH URETEROSCOPY AND STENT PLACEMENT;  Surgeon: Festus Aloe, MD;  Location: [facility];  Service: Urology;  Laterality: Left;  . HOLMIUM LASER APPLICATION Left 3/50/0938   Procedure: HOLMIUM LASER APPLICATION;  Surgeon: Festus Aloe, MD;  Location: [facility];  Service: Urology;  Laterality: Left;  . LEFT HEART CATHETERIZATION WITH CORONARY ANGIOGRAM N/A 02/12/2013   Procedure: LEFT HEART CATHETERIZATION WITH CORONARY ANGIOGRAM;  Surgeon: Troy Sine, MD;  Location: [facility] CATH LAB;  Service: Cardiovascular;  Laterality: N/A;  . PERCUTANEOUS CORONARY STENT INTERVENTION (PCI-S)  02/12/2013   Procedure: PERCUTANEOUS CORONARY STENT INTERVENTION (PCI-S);  Surgeon: Troy Sine, MD;  Location: [facility] CATH LAB;  Service: Cardiovascular;;  STEMI  . PERCUTANEOUS CORONARY STENT INTERVENTION (PCI-S) N/A 02/14/2013   Procedure: PERCUTANEOUS CORONARY STENT INTERVENTION (PCI-S);  Surgeon:  Burnell Blanks, MD;  Location: [facility] CATH LAB;  Service: Cardiovascular;  Laterality: N/A;  Proximal LAD  . TEE WITHOUT CARDIOVERSION N/A 02/15/2013   Procedure: TRANSESOPHAGEAL ECHOCARDIOGRAM (TEE);  Surgeon: Thayer Headings, MD;  Location: [facility] ENDOSCOPY;  Service: Cardiovascular;  Laterality: N/A;    FAMHx:  Family History  Problem Relation Age of Onset  . Heart disease Mother   . Diabetes Mother   . Stroke Mother   . Diabetes Father     SOCHx:   reports that he quit smoking about 3 years ago. His smoking use included cigarettes. He has a 17.50 pack-year smoking history. he has never used smokeless tobacco. He reports that he does not drink alcohol or use drugs.  ALLERGIES:  No Known Allergies  ROS: Pertinent items noted in HPI and remainder of comprehensive ROS otherwise negative.  HOME MEDS: Current Outpatient Medications  Medication Sig Dispense Refill  . acetaminophen (TYLENOL) 650 MG CR tablet Take 650 mg by mouth every 8 (eight) hours as needed for pain.    Marland Kitchen aspirin EC 81 MG tablet Take 81 mg by mouth daily.    Marland Kitchen atorvastatin (LIPITOR) 80 MG tablet TAKE 1 TABLET BY MOUTH EVERY DAY AT 6PM 90 tablet 1  . Docusate Calcium (STOOL SOFTENER PO) Take 2 capsules by mouth at bedtime.    . dofetilide (TIKOSYN) 250 MCG capsule Take 1 capsule (250 mcg total) 2 (two) times daily by mouth. 180 capsule 3  . DULoxetine (CYMBALTA) 30 MG capsule Take 60 mg by mouth at bedtime.     . magnesium oxide (MAG-OX) 400 (241.3 MG) MG tablet Take 1 tablet (400 mg total) by mouth daily. 30 tablet 11  . metoprolol succinate (TOPROL-XL) 50 MG 24 hr tablet TAKE 1 TABLET (50 MG TOTAL) BY MOUTH DAILY. TAKE WITH OR IMMEDIATELY FOLLOWING A MEAL. 90 tablet 0  . Multiple Vitamin (MULTIVITAMIN WITH MINERALS) TABS tablet Take 1 tablet by mouth every evening.     . nitroGLYCERIN (NITROSTAT) 0.4 MG SL tablet PLACE 1 TABLET UNDER TONGUE EVERY 5 MINUTES AS NEEDED FOR CHEST PAIN MAX 3 TABLETS 25 tablet 3  . NON  FORMULARY at bedtime. CPAP    .  pantoprazole (PROTONIX) 40 MG tablet TAKE 1 TABLET BY MOUTH EVERY DAY 30 tablet 0  . potassium chloride (K-DUR,KLOR-CON) 10 MEQ tablet TAKE 1 TABLET BY MOUTH DAILY 30 tablet 0  . rivaroxaban (XARELTO) 20 MG TABS tablet Take 1 tablet (20 mg total) daily with supper by mouth. 90 tablet 3  . topiramate (TOPAMAX) 50 MG tablet Take 1 tablet (50 mg total) by mouth 2 (two) times daily. Start 1 tablet daily x 1 week then twice daily 60 tablet 2  . dofetilide (TIKOSYN) 250 MCG capsule TAKE ONE CAPSULE BY MOUTH TWICE A DAY 90 capsule 3   No current facility-administered medications for this visit.     LABS/IMAGING: No results found for this or any previous visit (from the past 48 hour(s)). No results found.  VITALS: BP 114/70   Pulse 61   Ht 5\' 7"  (1.702 m)   Wt 266 lb 12.8 oz (121 kg)   BMI 41.79 kg/m   EXAM: General appearance: alert and no distress Neck: no carotid bruit, no JVD and thyroid not enlarged, symmetric, no tenderness/mass/nodules Lungs: clear to auscultation bilaterally Heart: regular rate and rhythm, S1, S2 normal, no murmur, click, rub or gallop Abdomen: soft, non-tender; bowel sounds normal; no masses,  no organomegaly Extremities: extremities normal, atraumatic, no cyanosis or edema Pulses: 2+ and symmetric Skin: Skin color, texture, turgor normal. No rashes or lesions Neurologic: Grossly normal Psych: Normal  EKG: Normal sinus rhythm at 61, QTC 370 ms-personally reviewed  ASSESSMENT: 1. CAD s/p PCi to the entire RCA (50+ mm) with DES in 02/2013 and mid-LAD 02/2013 2. Paroxysmal atrial fibrillation - maintaining sinus on Tikosyn 3. OSA on CPAP 4. Low back pain - low risk for surgery 5. Pulmonary embolus of unknown etiology (02/2014) on Xarelto  PLAN: 1.   Mr. Mcchristian has not had the desired result from his back surgery.  He may need a spinal fusion.  He denies any recurrent A. fib.  He is maintaining sinus on T consent with a normal  QTC.  He is compliant with CPAP.  He will require lifelong anticoagulation with Xarelto for A. fib and pulmonary embolus.  He is on aspirin for coronary disease and prior stents.  Follow-up with me in 6 months or sooner as necessary  Pixie Casino, MD, FACC, [place] Director of the [facility] Attending Cardiologist  Direct Dial: [phone]  Fax: [phone]  Website:  [email or web address] 11/25/2016, 6:31 PM

## 2016-11-26 ENCOUNTER — Ambulatory Visit
Admission: RE | Admit: 2016-11-26 | Discharge: 2016-11-26 | Disposition: A | Discharge status: Home / Self Care | Payer: BLUE CROSS/BLUE SHIELD | Source: Ambulatory Visit | Attending: Neurosurgery | Admitting: Neurosurgery

## 2016-11-26 DIAGNOSIS — I739 Peripheral vascular disease, unspecified: Secondary | ICD-10-CM

## 2016-11-26 DIAGNOSIS — I70213 Atherosclerosis of native arteries of extremities with intermittent claudication, bilateral legs: Secondary | ICD-10-CM | POA: Diagnosis not present

## 2016-12-09 ENCOUNTER — Other Ambulatory Visit: Payer: Self-pay

## 2016-12-09 DIAGNOSIS — I739 Peripheral vascular disease, unspecified: Secondary | ICD-10-CM

## 2016-12-11 ENCOUNTER — Other Ambulatory Visit: Payer: Self-pay | Admitting: Internal Medicine

## 2016-12-15 ENCOUNTER — Other Ambulatory Visit: Payer: Self-pay | Admitting: Internal Medicine

## 2016-12-24 ENCOUNTER — Other Ambulatory Visit: Payer: Self-pay | Admitting: Internal Medicine

## 2016-12-25 NOTE — Telephone Encounter (Signed)
Rx(s) sent to pharmacy electronically.  

## 2017-01-07 ENCOUNTER — Other Ambulatory Visit: Payer: Self-pay

## 2017-01-07 ENCOUNTER — Encounter: Payer: Self-pay | Admitting: Vascular Surgery

## 2017-01-07 ENCOUNTER — Ambulatory Visit (INDEPENDENT_AMBULATORY_CARE_PROVIDER_SITE_OTHER): Payer: BLUE CROSS/BLUE SHIELD | Admitting: Vascular Surgery

## 2017-01-07 ENCOUNTER — Ambulatory Visit (HOSPITAL_COMMUNITY)
Admission: RE | Admit: 2017-01-07 | Discharge: 2017-01-07 | Disposition: A | Discharge status: Home / Self Care | Payer: BLUE CROSS/BLUE SHIELD | Source: Ambulatory Visit | Attending: Vascular Surgery | Admitting: Vascular Surgery

## 2017-01-07 VITALS — BP 131/81 | HR 84 | Temp 97.4°F | Resp 18 | Ht 67.0 in | Wt 269.0 lb

## 2017-01-07 DIAGNOSIS — I70201 Unspecified atherosclerosis of native arteries of extremities, right leg: Secondary | ICD-10-CM | POA: Diagnosis not present

## 2017-01-07 DIAGNOSIS — I739 Peripheral vascular disease, unspecified: Secondary | ICD-10-CM

## 2017-01-07 LAB — VAS US LOWER EXTREMITY ARTERIAL DUPLEX
LATIBDISTSYS: 29 cm/s
LSFPPSV: -110 cm/s
Left super femoral dist sys PSV: -100 cm/s
Left super femoral mid sys PSV: -65 cm/s
RIGHT ANT DIST TIBAL SYS PSV: 35 cm/s
RSFMPSV: -192 cm/s
RTIBDISTSYS: 44 cm/s
Right super femoral dist sys PSV: -29 cm/s
Right super femoral prox sys PSV: 68 cm/s
left post tibial dist sys: 49 cm/s

## 2017-01-07 NOTE — H&P (View-Only) (Signed)
Patient name: Paul Todd MRN: [id number] DOB: [birth year] Sex: male   REASON FOR CONSULT:    Peripheral vascular disease.  The consult is requested by Dr. Kathyrn Sheriff.   HPI:   Paul Todd is a pleasant 57 y.o. male, with a long history of bilateral lower extremity pain.  He has experienced pain in both legs for the last 8-10 years.  His symptoms are brought on by ambulation and relieved with rest.  His symptoms are more significant on the left side he experiences pain in his hips calves and feet bilaterally after walking for about 3 minutes.  Her symptoms resolved after about 4 minutes of rest.  He does occasionally get some pain simply with standing he also describes that his feet feel very sensitive at night but he does not describe true rest pain.  He denies any history of nonhealing wounds.  He did have back surgery in August 2017 but this did not significantly help his leg pain.  Patient has a history of atrial fibrillation and for this reason is on Xarelto.  His risk factors for peripheral vascular disease include hypertension, hypercholesterolemia, a family history of premature cardiovascular disease, and history of tobacco use.  He denies any history of diabetes.  He is on Xarelto for atrial fibrillation.  He is also had a PE in the left lung.  This occurred when he was on Xarelto reportedly.  I have reviewed the records that were sent from the referring office.  The patient was having leg pain and was felt that this could potentially be vascular in origin.  Past Medical History:  Diagnosis Date  . Arthritis       . CAD (coronary artery disease)    multivessel per cath 02/12/13, s/p stenting to RCA  . Chronic lower back pain   . Dyslipidemia   . Hx of Bell's palsy   . Hypertension   . Myocardial infarction ([place]) 02/12/2013   . Nephrolithiasis   . OSA on CPAP   . PE (pulmonary thromboembolism) ([place]) 02/2014  . Persistent atrial fibrillation ([facility])         Family  History  Problem Relation Age of Onset  . Heart disease Mother   . Diabetes Mother   . Stroke Mother   . Diabetes Father     SOCIAL HISTORY: He quit tobacco in February 2015. Social History   Socioeconomic History  . Marital status: Married    Spouse name: Not on file  . Number of children: Not on file  . Years of education: Not on file  . Highest education level: Not on file  Social Needs  . Financial resource strain: Not on file  . Food insecurity - worry: Not on file  . Food insecurity - inability: Not on file  . Transportation needs - medical: Not on file  . Transportation needs - non-medical: Not on file  Occupational History  . Not on file  Tobacco Use  . Smoking status: Former Smoker    Packs/day: 0.50    Years: 35.00    Pack years: 17.50    Types: Cigarettes    Last attempt to quit: 02/12/2013    Years since quitting: 3.9  . Smokeless tobacco: Never Used  Substance and Sexual Activity  . Alcohol use: No    Comment: 07/21/2014 "never drank much; might have a couple drinks/yr since MI 02/2013"  . Drug use: No  . Sexual activity: Yes    Birth control/protection: None  Other Topics Concern  . Not on file  Social History Narrative  . Not on file    No Known Allergies  Current Outpatient Medications  Medication Sig Dispense Refill  . acetaminophen (TYLENOL) 650 MG CR tablet Take 650 mg by mouth every 8 (eight) hours as needed for pain.    Marland Kitchen aspirin EC 81 MG tablet Take 81 mg by mouth daily.    Marland Kitchen atorvastatin (LIPITOR) 80 MG tablet TAKE 1 TABLET BY MOUTH EVERY DAY AT 6PM 90 tablet 1  . Docusate Calcium (STOOL SOFTENER PO) Take 2 capsules by mouth at bedtime.    . dofetilide (TIKOSYN) 250 MCG capsule TAKE ONE CAPSULE BY MOUTH TWICE A DAY 90 capsule 3  . dofetilide (TIKOSYN) 250 MCG capsule Take 1 capsule (250 mcg total) 2 (two) times daily by mouth. 180 capsule 3  . DULoxetine (CYMBALTA) 30 MG capsule Take 60 mg by mouth at bedtime.     . magnesium oxide (MAG-OX)  400 (241.3 MG) MG tablet Take 1 tablet (400 mg total) by mouth daily. 30 tablet 11  . metoprolol succinate (TOPROL-XL) 50 MG 24 hr tablet TAKE 1 TABLET (50 MG TOTAL) BY MOUTH DAILY. TAKE WITH OR IMMEDIATELY FOLLOWING A MEAL. 90 tablet 0  . Multiple Vitamin (MULTIVITAMIN WITH MINERALS) TABS tablet Take 1 tablet by mouth every evening.     . nitroGLYCERIN (NITROSTAT) 0.4 MG SL tablet PLACE 1 TABLET UNDER TONGUE EVERY 5 MINUTES AS NEEDED FOR CHEST PAIN MAX 3 TABLETS 25 tablet 3  . NON FORMULARY at bedtime. CPAP    . pantoprazole (PROTONIX) 40 MG tablet TAKE 1 TABLET BY MOUTH EVERY DAY 30 tablet 11  . potassium chloride (K-DUR,KLOR-CON) 10 MEQ tablet TAKE 1 TABLET BY MOUTH DAILY 30 tablet 0  . rivaroxaban (XARELTO) 20 MG TABS tablet Take 1 tablet (20 mg total) daily with supper by mouth. 90 tablet 3  . topiramate (TOPAMAX) 50 MG tablet Take 1 tablet (50 mg total) by mouth 2 (two) times daily. Start 1 tablet daily x 1 week then twice daily 60 tablet 2   No current facility-administered medications for this visit.     REVIEW OF SYSTEMS:  [X]  denotes positive finding, [ ]  denotes negative finding Cardiac  Comments:  Chest pain or chest pressure:    Shortness of breath upon exertion:    Short of breath when lying flat:    Irregular heart rhythm:        Vascular    Pain in calf, thigh, or hip brought on by ambulation: X   Pain in feet at night that wakes you up from your sleep:  X   Blood clot in your veins:    Leg swelling:  X       Pulmonary    Oxygen at home:    Productive cough:     Wheezing:         Neurologic    Sudden weakness in arms or legs:     Sudden numbness in arms or legs:  X   Sudden onset of difficulty speaking or slurred speech:    Temporary loss of vision in one eye:     Problems with dizziness:  X       Gastrointestinal    Blood in stool:     Vomited blood:         Genitourinary    Burning when urinating:     Blood in urine:        Psychiatric  Major  depression:         Hematologic    Bleeding problems:    Problems with blood clotting too easily:        Skin    Rashes or ulcers:        Constitutional    Fever or chills:     PHYSICAL EXAM:   Vitals:   01/07/17 1017  Resp: 18  Weight: 269 lb (122 kg)  Height: 5\' 7"  (1.702 m)  Body mass index is 42.13 kg/m.   GENERAL: The patient is a well-nourished male, in no acute distress. The vital signs are documented above. CARDIAC: There is a regular rate and rhythm.  VASCULAR: I do not detect carotid bruits. He has palpable femoral pulses bilaterally.  These are somewhat diminished.  I cannot palpate pedal pulses on either side. PULMONARY: There is good air exchange bilaterally without wheezing or rales. ABDOMEN: Soft and non-tender with normal pitched bowel sounds.  I do not palpate an abdominal aortic aneurysm although it is difficult to assess because of his size. MUSCULOSKELETAL: There are no major deformities or cyanosis. NEUROLOGIC: No focal weakness or paresthesias are detected. SKIN: There are no ulcers or rashes noted. PSYCHIATRIC: The patient has a normal affect.  DATA:    BILATERAL LOWER EXTREMITY ARTERIAL DUPLEX: I have independently interpreted his duplex scan today.  On the right side there is a triphasic waveform in the common femoral artery and deep femoral artery.  There are monophasic signals from the mid superficial femoral artery down.  There appears to be a 50-74% stenosis in the right mid superficial femoral artery.  On the left side there is a triphasic common femoral artery waveform and proximal superficial femoral artery waveform.  There are monophasic signals from the mid superficial femoral artery distally.  It appears that the left superficial femoral artery may be occluded.  ABIS: I have reviewed the ABIs that were done by [facility] imaging on 11/26/2016.  ABI on the right at rest was 95%.  This dropped to 53% with exercise.  On the left side ABI was  88%.  This dropped to 48% with exercise.  MEDICAL ISSUES:   PERIPHERAL VASCULAR DISEASE: Based on his duplex scan he appears to have infrainguinal arterial occlusive disease bilaterally.  His symptoms are significantly disabling in that he travels and has  a hard time getting around the airport.  He may still have some component of back issues given that he sometimes gets pain with simply standing.  In addition, he may have some aortoiliac occlusive disease given that he has significant hip claudication.  However I cannot palpate femoral pulses and he has triphasic femoral waveforms by duplex.  Regardless, given that his symptoms are significantly disabling and interfering with his lifestyle, I have recommended that we proceed with arteriography and possible angioplasty and stenting.  I have reviewed with the patient the indications for arteriography. In addition, I have reviewed the potential complications of arteriography including but not limited to: Bleeding, arterial injury, arterial thrombosis, dye action, renal insufficiency, or other unpredictable medical problems. I have explained to the patient that if we find disease amenable to angioplasty we could potentially address this at the same time. I have discussed the potential complications of angioplasty and stenting, including but not limited to: Bleeding, arterial thrombosis, arterial injury, dissection, or the need for surgical intervention.  We have also discussed conservative treatment and a structured walking program.  However he feels that he has had the symptoms  for 8-10 years and can no longer tolerate this.  He is quit smoking already.  We will need to hold his Xarelto prior to the procedure.  [name] [facility] [phone]

## 2017-01-07 NOTE — Progress Notes (Signed)
Vitals:   01/07/17 1017  BP: (!) 141/80  Pulse: 84  Resp: 18  Temp: (!) 97.4 F (36.3 C)  TempSrc: Oral  Weight: 269 lb (122 kg)  Height: 5\' 7"  (1.702 m)

## 2017-01-07 NOTE — Progress Notes (Signed)
Patient name: Paul Todd MRN: [id number] DOB: June 29, 1960 Sex: male   REASON FOR CONSULT:    Peripheral vascular disease.  The consult is requested by Dr. Kathyrn Sheriff.   HPI:   Paul Todd is a pleasant 57 y.o. male, with a long history of bilateral lower extremity pain.  He has experienced pain in both legs for the last 8-10 years.  His symptoms are brought on by ambulation and relieved with rest.  His symptoms are more significant on the left side he experiences pain in his hips calves and feet bilaterally after walking for about 3 minutes.  Her symptoms resolved after about 4 minutes of rest.  He does occasionally get some pain simply with standing he also describes that his feet feel very sensitive at night but he does not describe true rest pain.  He denies any history of nonhealing wounds.  He did have back surgery in August 2017 but this did not significantly help his leg pain.  Patient has a history of atrial fibrillation and for this reason is on Xarelto.  His risk factors for peripheral vascular disease include hypertension, hypercholesterolemia, a family history of premature cardiovascular disease, and history of tobacco use.  He denies any history of diabetes.  He is on Xarelto for atrial fibrillation.  He is also had a PE in the left lung.  This occurred when he was on Xarelto reportedly.  I have reviewed the records that were sent from the referring office.  The patient was having leg pain and was felt that this could potentially be vascular in origin.  Past Medical History:  Diagnosis Date  . Arthritis       . CAD (coronary artery disease)    multivessel per cath 02/12/13, s/p stenting to RCA  . Chronic lower back pain   . Dyslipidemia   . Hx of Bell's palsy   . Hypertension   . Myocardial infarction ([place]) 02/12/2013   . Nephrolithiasis   . OSA on CPAP   . PE (pulmonary thromboembolism) ([place]) 02/2014  . Persistent atrial fibrillation (HCC)         Family  History  Problem Relation Age of Onset  . Heart disease Mother   . Diabetes Mother   . Stroke Mother   . Diabetes Father     SOCIAL HISTORY: He quit tobacco in February 2015. Social History   Socioeconomic History  . Marital status: Married    Spouse name: Not on file  . Number of children: Not on file  . Years of education: Not on file  . Highest education level: Not on file  Social Needs  . Financial resource strain: Not on file  . Food insecurity - worry: Not on file  . Food insecurity - inability: Not on file  . Transportation needs - medical: Not on file  . Transportation needs - non-medical: Not on file  Occupational History  . Not on file  Tobacco Use  . Smoking status: Former Smoker    Packs/day: 0.50    Years: 35.00    Pack years: 17.50    Types: Cigarettes    Last attempt to quit: 02/12/2013    Years since quitting: 3.9  . Smokeless tobacco: Never Used  Substance and Sexual Activity  . Alcohol use: No    Comment: 07/21/2014 "never drank much; might have a couple drinks/yr since MI 02/2013"  . Drug use: No  . Sexual activity: Yes    Birth control/protection: None  Other Topics Concern  . Not on file  Social History Narrative  . Not on file    No Known Allergies  Current Outpatient Medications  Medication Sig Dispense Refill  . acetaminophen (TYLENOL) 650 MG CR tablet Take 650 mg by mouth every 8 (eight) hours as needed for pain.    Marland Kitchen aspirin EC 81 MG tablet Take 81 mg by mouth daily.    Marland Kitchen atorvastatin (LIPITOR) 80 MG tablet TAKE 1 TABLET BY MOUTH EVERY DAY AT 6PM 90 tablet 1  . Docusate Calcium (STOOL SOFTENER PO) Take 2 capsules by mouth at bedtime.    . dofetilide (TIKOSYN) 250 MCG capsule TAKE ONE CAPSULE BY MOUTH TWICE A DAY 90 capsule 3  . dofetilide (TIKOSYN) 250 MCG capsule Take 1 capsule (250 mcg total) 2 (two) times daily by mouth. 180 capsule 3  . DULoxetine (CYMBALTA) 30 MG capsule Take 60 mg by mouth at bedtime.     . magnesium oxide (MAG-OX)  400 (241.3 MG) MG tablet Take 1 tablet (400 mg total) by mouth daily. 30 tablet 11  . metoprolol succinate (TOPROL-XL) 50 MG 24 hr tablet TAKE 1 TABLET (50 MG TOTAL) BY MOUTH DAILY. TAKE WITH OR IMMEDIATELY FOLLOWING A MEAL. 90 tablet 0  . Multiple Vitamin (MULTIVITAMIN WITH MINERALS) TABS tablet Take 1 tablet by mouth every evening.     . nitroGLYCERIN (NITROSTAT) 0.4 MG SL tablet PLACE 1 TABLET UNDER TONGUE EVERY 5 MINUTES AS NEEDED FOR CHEST PAIN MAX 3 TABLETS 25 tablet 3  . NON FORMULARY at bedtime. CPAP    . pantoprazole (PROTONIX) 40 MG tablet TAKE 1 TABLET BY MOUTH EVERY DAY 30 tablet 11  . potassium chloride (K-DUR,KLOR-CON) 10 MEQ tablet TAKE 1 TABLET BY MOUTH DAILY 30 tablet 0  . rivaroxaban (XARELTO) 20 MG TABS tablet Take 1 tablet (20 mg total) daily with supper by mouth. 90 tablet 3  . topiramate (TOPAMAX) 50 MG tablet Take 1 tablet (50 mg total) by mouth 2 (two) times daily. Start 1 tablet daily x 1 week then twice daily 60 tablet 2   No current facility-administered medications for this visit.     REVIEW OF SYSTEMS:  [X]  denotes positive finding, [ ]  denotes negative finding Cardiac  Comments:  Chest pain or chest pressure:    Shortness of breath upon exertion:    Short of breath when lying flat:    Irregular heart rhythm:        Vascular    Pain in calf, thigh, or hip brought on by ambulation: X   Pain in feet at night that wakes you up from your sleep:  X   Blood clot in your veins:    Leg swelling:  X       Pulmonary    Oxygen at home:    Productive cough:     Wheezing:         Neurologic    Sudden weakness in arms or legs:     Sudden numbness in arms or legs:  X   Sudden onset of difficulty speaking or slurred speech:    Temporary loss of vision in one eye:     Problems with dizziness:  X       Gastrointestinal    Blood in stool:     Vomited blood:         Genitourinary    Burning when urinating:     Blood in urine:        Psychiatric  Major  depression:         Hematologic    Bleeding problems:    Problems with blood clotting too easily:        Skin    Rashes or ulcers:        Constitutional    Fever or chills:     PHYSICAL EXAM:   Vitals:   01/07/17 1017  Resp: 18  Weight: 269 lb (122 kg)  Height: 5\' 7"  (1.702 m)  Body mass index is 42.13 kg/m.   GENERAL: The patient is a well-nourished male, in no acute distress. The vital signs are documented above. CARDIAC: There is a regular rate and rhythm.  VASCULAR: I do not detect carotid bruits. He has palpable femoral pulses bilaterally.  These are somewhat diminished.  I cannot palpate pedal pulses on either side. PULMONARY: There is good air exchange bilaterally without wheezing or rales. ABDOMEN: Soft and non-tender with normal pitched bowel sounds.  I do not palpate an abdominal aortic aneurysm although it is difficult to assess because of his size. MUSCULOSKELETAL: There are no major deformities or cyanosis. NEUROLOGIC: No focal weakness or paresthesias are detected. SKIN: There are no ulcers or rashes noted. PSYCHIATRIC: The patient has a normal affect.  DATA:    BILATERAL LOWER EXTREMITY ARTERIAL DUPLEX: I have independently interpreted his duplex scan today.  On the right side there is a triphasic waveform in the common femoral artery and deep femoral artery.  There are monophasic signals from the mid superficial femoral artery down.  There appears to be a 50-74% stenosis in the right mid superficial femoral artery.  On the left side there is a triphasic common femoral artery waveform and proximal superficial femoral artery waveform.  There are monophasic signals from the mid superficial femoral artery distally.  It appears that the left superficial femoral artery may be occluded.  ABIS: I have reviewed the ABIs that were done by [facility] imaging on 11/26/2016.  ABI on the right at rest was 95%.  This dropped to 53% with exercise.  On the left side ABI was  88%.  This dropped to 48% with exercise.  MEDICAL ISSUES:   PERIPHERAL VASCULAR DISEASE: Based on his duplex scan he appears to have infrainguinal arterial occlusive disease bilaterally.  His symptoms are significantly disabling in that he travels and has  a hard time getting around the airport.  He may still have some component of back issues given that he sometimes gets pain with simply standing.  In addition, he may have some aortoiliac occlusive disease given that he has significant hip claudication.  However I cannot palpate femoral pulses and he has triphasic femoral waveforms by duplex.  Regardless, given that his symptoms are significantly disabling and interfering with his lifestyle, I have recommended that we proceed with arteriography and possible angioplasty and stenting.  I have reviewed with the patient the indications for arteriography. In addition, I have reviewed the potential complications of arteriography including but not limited to: Bleeding, arterial injury, arterial thrombosis, dye action, renal insufficiency, or other unpredictable medical problems. I have explained to the patient that if we find disease amenable to angioplasty we could potentially address this at the same time. I have discussed the potential complications of angioplasty and stenting, including but not limited to: Bleeding, arterial thrombosis, arterial injury, dissection, or the need for surgical intervention.  We have also discussed conservative treatment and a structured walking program.  However he feels that he has had the symptoms  for 8-10 years and can no longer tolerate this.  He is quit smoking already.  We will need to hold his Xarelto prior to the procedure.  [name] [facility] [phone]

## 2017-01-21 ENCOUNTER — Encounter: Payer: Self-pay | Admitting: Vascular Surgery

## 2017-01-21 ENCOUNTER — Encounter (HOSPITAL_COMMUNITY): Payer: Self-pay

## 2017-01-26 ENCOUNTER — Ambulatory Visit (HOSPITAL_COMMUNITY)
Admission: RE | Admit: 2017-01-26 | Discharge: 2017-01-26 | Disposition: A | Discharge status: Home / Self Care | Payer: BLUE CROSS/BLUE SHIELD | Source: Ambulatory Visit | Attending: Vascular Surgery | Admitting: Vascular Surgery

## 2017-01-26 ENCOUNTER — Ambulatory Visit (HOSPITAL_COMMUNITY)
Admission: RE | Disposition: A | Discharge status: Home / Self Care | Payer: Self-pay | Source: Ambulatory Visit | Attending: Vascular Surgery

## 2017-01-26 ENCOUNTER — Telehealth: Payer: Self-pay | Admitting: Vascular Surgery

## 2017-01-26 DIAGNOSIS — I252 Old myocardial infarction: Secondary | ICD-10-CM | POA: Insufficient documentation

## 2017-01-26 DIAGNOSIS — E7800 Pure hypercholesterolemia, unspecified: Secondary | ICD-10-CM | POA: Diagnosis not present

## 2017-01-26 DIAGNOSIS — I10 Essential (primary) hypertension: Secondary | ICD-10-CM | POA: Diagnosis not present

## 2017-01-26 DIAGNOSIS — I70213 Atherosclerosis of native arteries of extremities with intermittent claudication, bilateral legs: Secondary | ICD-10-CM | POA: Insufficient documentation

## 2017-01-26 DIAGNOSIS — Z86711 Personal history of pulmonary embolism: Secondary | ICD-10-CM | POA: Insufficient documentation

## 2017-01-26 DIAGNOSIS — Z7901 Long term (current) use of anticoagulants: Secondary | ICD-10-CM | POA: Insufficient documentation

## 2017-01-26 DIAGNOSIS — E785 Hyperlipidemia, unspecified: Secondary | ICD-10-CM | POA: Insufficient documentation

## 2017-01-26 DIAGNOSIS — I481 Persistent atrial fibrillation: Secondary | ICD-10-CM | POA: Insufficient documentation

## 2017-01-26 DIAGNOSIS — Z8249 Family history of ischemic heart disease and other diseases of the circulatory system: Secondary | ICD-10-CM | POA: Diagnosis not present

## 2017-01-26 DIAGNOSIS — G4733 Obstructive sleep apnea (adult) (pediatric): Secondary | ICD-10-CM | POA: Diagnosis not present

## 2017-01-26 DIAGNOSIS — Z6841 Body Mass Index (BMI) 40.0 and over, adult: Secondary | ICD-10-CM | POA: Insufficient documentation

## 2017-01-26 DIAGNOSIS — Z7982 Long term (current) use of aspirin: Secondary | ICD-10-CM | POA: Insufficient documentation

## 2017-01-26 DIAGNOSIS — Z79899 Other long term (current) drug therapy: Secondary | ICD-10-CM | POA: Diagnosis not present

## 2017-01-26 DIAGNOSIS — I2510 Atherosclerotic heart disease of native coronary artery without angina pectoris: Secondary | ICD-10-CM | POA: Insufficient documentation

## 2017-01-26 DIAGNOSIS — Z87891 Personal history of nicotine dependence: Secondary | ICD-10-CM | POA: Insufficient documentation

## 2017-01-26 DIAGNOSIS — I739 Peripheral vascular disease, unspecified: Secondary | ICD-10-CM | POA: Diagnosis present

## 2017-01-26 DIAGNOSIS — E669 Obesity, unspecified: Secondary | ICD-10-CM | POA: Diagnosis not present

## 2017-01-26 DIAGNOSIS — I4891 Unspecified atrial fibrillation: Secondary | ICD-10-CM | POA: Insufficient documentation

## 2017-01-26 DIAGNOSIS — I745 Embolism and thrombosis of iliac artery: Secondary | ICD-10-CM | POA: Insufficient documentation

## 2017-01-26 HISTORY — PX: ABDOMINAL AORTOGRAM W/LOWER EXTREMITY: CATH118223

## 2017-01-26 LAB — POCT I-STAT, CHEM 8
BUN: 15 mg/dL (ref 6–20)
Calcium, Ion: 1.15 mmol/L (ref 1.15–1.40)
Chloride: 108 mmol/L (ref 101–111)
Creatinine, Ser: 0.8 mg/dL (ref 0.61–1.24)
Glucose, Bld: 137 mg/dL — ABNORMAL HIGH (ref 65–99)
HEMATOCRIT: 44 % (ref 39.0–52.0)
HEMOGLOBIN: 15 g/dL (ref 13.0–17.0)
Potassium: 4.2 mmol/L (ref 3.5–5.1)
SODIUM: 142 mmol/L (ref 135–145)
TCO2: 23 mmol/L (ref 22–32)

## 2017-01-26 SURGERY — ABDOMINAL AORTOGRAM W/LOWER EXTREMITY
Anesthesia: LOCAL

## 2017-01-26 MED ORDER — SODIUM CHLORIDE 0.9% FLUSH: 3.0000 mL | Freq: Two times a day (BID) | INTRAVENOUS | Status: DC

## 2017-01-26 MED ORDER — OXYCODONE-ACETAMINOPHEN 5-325 MG PO TABS: 1.0000 | ORAL_TABLET | ORAL | Status: DC | PRN

## 2017-01-26 MED ORDER — FENTANYL CITRATE (PF) 100 MCG/2ML IJ SOLN
INTRAMUSCULAR | Status: DC | PRN
  Administered 2017-01-26 (×3): 50 ug via INTRAVENOUS

## 2017-01-26 MED ORDER — LIDOCAINE HCL 1 % IJ SOLN
INTRAMUSCULAR | Status: AC
  Filled 2017-01-26: qty 20

## 2017-01-26 MED ORDER — HEPARIN (PORCINE) IN NACL 2-0.9 UNIT/ML-% IJ SOLN
INTRAMUSCULAR | Status: AC
  Filled 2017-01-26: qty 1000

## 2017-01-26 MED ORDER — LABETALOL HCL 5 MG/ML IV SOLN: 10.0000 mg | INTRAVENOUS | Status: DC | PRN

## 2017-01-26 MED ORDER — FENTANYL CITRATE (PF) 100 MCG/2ML IJ SOLN
INTRAMUSCULAR | Status: AC
  Filled 2017-01-26: qty 2

## 2017-01-26 MED ORDER — SODIUM CHLORIDE 0.9 % IV SOLN: 250.0000 mL | INTRAVENOUS | Status: DC | PRN

## 2017-01-26 MED ORDER — SODIUM CHLORIDE 0.9 % IV SOLN
INTRAVENOUS | Status: DC
  Administered 2017-01-26: 06:00:00 via INTRAVENOUS

## 2017-01-26 MED ORDER — MIDAZOLAM HCL 2 MG/2ML IJ SOLN
INTRAMUSCULAR | Status: AC
  Filled 2017-01-26: qty 2

## 2017-01-26 MED ORDER — HEPARIN (PORCINE) IN NACL 2-0.9 UNIT/ML-% IJ SOLN
INTRAMUSCULAR | Status: AC | PRN
  Administered 2017-01-26: 1000 mL via INTRA_ARTERIAL

## 2017-01-26 MED ORDER — LIDOCAINE HCL (PF) 1 % IJ SOLN
INTRAMUSCULAR | Status: DC | PRN
  Administered 2017-01-26: 40 mL

## 2017-01-26 MED ORDER — SODIUM CHLORIDE 0.9 % WEIGHT BASED INFUSION: 1.0000 mL/kg/h | INTRAVENOUS | Status: DC

## 2017-01-26 MED ORDER — IODIXANOL 320 MG/ML IV SOLN
INTRAVENOUS | Status: DC | PRN
  Administered 2017-01-26: 157 mL via INTRA_ARTERIAL

## 2017-01-26 MED ORDER — HYDRALAZINE HCL 20 MG/ML IJ SOLN: 5.0000 mg | INTRAMUSCULAR | Status: DC | PRN

## 2017-01-26 MED ORDER — MIDAZOLAM HCL 2 MG/2ML IJ SOLN
INTRAMUSCULAR | Status: DC | PRN
  Administered 2017-01-26 (×2): 1 mg via INTRAVENOUS

## 2017-01-26 MED ORDER — SODIUM CHLORIDE 0.9% FLUSH: 3.0000 mL | INTRAVENOUS | Status: DC | PRN

## 2017-01-26 SURGICAL SUPPLY — 12 items
CATH ANGIO 5F PIGTAIL 65CM (CATHETERS) ×2 IMPLANT
GLIDEWIRE NITREX 0.018X80X5 (WIRE) ×1
GUIDEWIRE NITREX 0.018X80X5 (WIRE) ×1 IMPLANT
KIT MICROINTRODUCER STIFF 5F (SHEATH) ×2 IMPLANT
KIT PV (KITS) ×2 IMPLANT
SHEATH PINNACLE 5F 10CM (SHEATH) ×2 IMPLANT
SYR MEDRAD MARK V 150ML (SYRINGE) ×2 IMPLANT
TRANSDUCER W/STOPCOCK (MISCELLANEOUS) ×2 IMPLANT
TRAY PV CATH (CUSTOM PROCEDURE TRAY) ×2 IMPLANT
WIRE BENTSON .035X145CM (WIRE) ×2 IMPLANT
WIRE ROSEN-J .035X180CM (WIRE) ×2 IMPLANT
WIRE TORQFLEX AUST .018X40CM (WIRE) ×2 IMPLANT

## 2017-01-26 NOTE — Op Note (Signed)
PATIENT: Paul Todd      MRN: [id number] DOB: 1960-08-24    DATE OF PROCEDURE: 01/26/2017  INDICATIONS:    [name] is a 57 y.o. male who presented with disabling bilateral lower extremity claudication.  He presents for arteriography.  PROCEDURE:    1.  Ultrasound-guided access to the right common femoral artery 2.  Aortogram with bilateral iliac arteriogram and bilateral lower extremity runoff  SURGEON: Judeth Cornfield. Scot Dock, MD, FACS  ANESTHESIA: Local with sedation  EBL: Minimal  TECHNIQUE: The patient was taken to the peripheral vascular lab and was sedated. The period of conscious sedation was 67 minutes.  During that time period, I was present face-to-face 100% of the time.  The patient was administered 1 mg of Versed and 50 mcg of fentanyl.  He received additional doses during the procedure. The patient's heart rate, blood pressure, and oxygen saturation were monitored by the nurse continuously during the procedure.  Both groins were prepped and draped in usual sterile fashion.  Under ultrasound guidance, the right common femoral artery was cannulated with a micropuncture needle after the skin was anesthetized.  Because of the depths of the artery the wire was angulated and would not advance.  Visualization was difficult because of his size.  Try the micropuncture needle on both sides but ultimately ended up back on the right and was able to cannulate the common femoral artery and advanced the micropuncture wire far enough that I could advance the sheath without difficulty.  This was exchanged for a 5 [place] sheath over a Bentson wire.  Pigtail catheter was positioned at the L1 vertebral body and flush aortogram obtained.  Catheter was positioned above the aortic bifurcation and oblique iliac projections were obtained.  Bilateral lower extremity runoff films were obtained.  Catheter was then removed over a wire.  The patient was transferred to the holding area for removal  of the sheath.  No immediate complications were noted.   FINDINGS:   1.  There are single renal arteries bilaterally with no significant renal artery stenosis identified. 2.  The infrarenal aorta is widely patent patent. 3.  On the left side, which is the more symptomatic side, the common iliac and external iliac arteries are patent.  The hypogastric artery on the left is occluded.  The common femoral and deep femoral arteries are patent.  The superficial femoral artery is occluded at the origin with reconstitution of the popliteal artery at the level of the knee.  There is three-vessel runoff on the left via the anterior tibial, posterior tibial, and peroneal arteries. 4.  On the right side, the common iliac, external iliac, hypogastric arteries are patent.  The common femoral and deep femoral arteries are patent.  The superficial femoral artery is occluded at its origin with reconstitution of the above-knee popliteal artery.  Three-vessel runoff to the right via the anterior tibial, posterior tibial, and peroneal arteries.  CLINICAL NOTE: He is only options for revascularization would be a femoral to below-knee popliteal artery bypass on the left and a femoral to above-knee popliteal artery bypass on the right.  Given his obesity I would not be anxious to proceed with surgery unless she developed severe symptoms, rest pain, or nonhealing ulcer.  I think he will be at significant risk for wound healing problems infection.  He would like to discuss this further in the office and I will arrange for an office visit with a vein map of the left great saphenous vein.  [name], MD, FACS [facility]  DATE OF DICTATION:   01/26/2017

## 2017-01-26 NOTE — Discharge Instructions (Signed)

## 2017-01-26 NOTE — Progress Notes (Addendum)
Site area: RFA Site Prior to Removal:  Level 0 Pressure Applied For:24 min Manual:  yes  Patient Status During Pull:  stable Post Pull Site:  Level 0 Post Pull Instructions Given:  yes Post Pull Pulses Present: doppler Dressing Applied:  tegaderm Bedrest begins @ 8329 till  Comments:

## 2017-01-26 NOTE — Interval H&P Note (Signed)
History and Physical Interval Note:  01/26/2017 7:27 AM  Paul Todd  has presented today for surgery, with the diagnosis of claudication  The various methods of treatment have been discussed with the patient and family. After consideration of risks, benefits and other options for treatment, the patient has consented to  Procedure(s): ABDOMINAL AORTOGRAM W/LOWER EXTREMITY (N/A) as a surgical intervention .  The patient's history has been reviewed, patient examined, no change in status, stable for surgery.  I have reviewed the patient's chart and labs.  Questions were answered to the patient's satisfaction.     [name]

## 2017-01-26 NOTE — Telephone Encounter (Signed)
-----   Message from Mena Goes, RN sent at 01/26/2017  9:50 AM EST ----- Regarding: 3-4 weeks Left GSV map and discuss surgery   ----- Message ----- From: Angelia Mould, MD Sent: 01/26/2017   8:53 AM To: Vvs Charge Pool Subject: charge and f/u                                  PROCEDURE:   1.  Ultrasound-guided access to the right common femoral artery 2.  Aortogram with bilateral iliac arteriogram and bilateral lower extremity runoff  SURGEON: Judeth Cornfield. Scot Dock, MD, FACS  ANESTHESIA: Local with sedation  He needs a follow-up visit in approximately  3-4 weeks to discuss his bilateral lower extremity claudication.  At that time he will need a vein map of the left great saphenous vein.  Thank you CD

## 2017-01-26 NOTE — Telephone Encounter (Signed)
Sched appt 02/25/17; lab at 3:30 and MD at 4:00. Lm on cell# to inform pt of appt.

## 2017-01-27 ENCOUNTER — Encounter (HOSPITAL_COMMUNITY): Payer: Self-pay | Admitting: Vascular Surgery

## 2017-01-30 MED FILL — Lidocaine HCl Local Inj 1%: INTRAMUSCULAR | Qty: 40 | Status: AC

## 2017-02-02 ENCOUNTER — Other Ambulatory Visit: Payer: Self-pay

## 2017-02-02 DIAGNOSIS — I739 Peripheral vascular disease, unspecified: Secondary | ICD-10-CM

## 2017-02-25 ENCOUNTER — Ambulatory Visit: Payer: Self-pay | Admitting: Vascular Surgery

## 2017-02-25 ENCOUNTER — Encounter (HOSPITAL_COMMUNITY): Payer: Self-pay

## 2017-03-02 ENCOUNTER — Ambulatory Visit (HOSPITAL_COMMUNITY)
Admission: RE | Admit: 2017-03-02 | Discharge: 2017-03-02 | Disposition: A | Discharge status: Home / Self Care | Payer: BLUE CROSS/BLUE SHIELD | Source: Ambulatory Visit | Attending: Vascular Surgery | Admitting: Vascular Surgery

## 2017-03-02 DIAGNOSIS — Z01818 Encounter for other preprocedural examination: Secondary | ICD-10-CM | POA: Insufficient documentation

## 2017-03-02 DIAGNOSIS — I739 Peripheral vascular disease, unspecified: Secondary | ICD-10-CM | POA: Insufficient documentation

## 2017-03-04 ENCOUNTER — Ambulatory Visit (INDEPENDENT_AMBULATORY_CARE_PROVIDER_SITE_OTHER): Payer: BLUE CROSS/BLUE SHIELD | Admitting: Vascular Surgery

## 2017-03-04 ENCOUNTER — Encounter: Payer: Self-pay | Admitting: Vascular Surgery

## 2017-03-04 ENCOUNTER — Telehealth: Payer: Self-pay | Admitting: Internal Medicine

## 2017-03-04 VITALS — BP 125/80 | HR 69 | Temp 97.3°F | Resp 18 | Ht 67.0 in | Wt 265.0 lb

## 2017-03-04 DIAGNOSIS — I739 Peripheral vascular disease, unspecified: Secondary | ICD-10-CM

## 2017-03-04 NOTE — Telephone Encounter (Signed)
Close encounter 

## 2017-03-04 NOTE — Progress Notes (Signed)
Patient name: Paul Todd MRN: [id number] DOB: 04-04-1960 Sex: male  REASON FOR VISIT:   Follow-up of peripheral vascular disease.  HPI:   Paul Todd is a pleasant 57 y.o. male who I seen with bilateral lower extremity claudication which he felt was disabling.  He underwent an arteriogram on 01/26/2017.  He comes in today to discuss those results.  He continues to have disabling bilateral lower extremity claudication which is more significant on the left side.  He describes pain in his left calf which is brought on by ambulation and relieved with rest. He does have some back pain and also paresthesias in both feet.  He experiences some claudication in the right also.  He travels a lot and is unable to get around the airport because of his claudication symptoms.  He denies any significant rest pain or nonhealing ulcers.  He is not a smoker.  He does have a history of atrial fibrillation and is on Xarelto.  He had a myocardial infarction 4 years ago and quit smoking at that time.  He had PTCA.  He is on aspirin and is on a statin.  Past Medical History:  Diagnosis Date  . Arthritis       . CAD (coronary artery disease)    multivessel per cath 02/12/13, s/p stenting to RCA  . Chronic lower back pain   . Dyslipidemia   . Hx of Bell's palsy   . Hypertension   . Myocardial infarction ([place]) 02/12/2013   . Nephrolithiasis   . OSA on CPAP   . PE (pulmonary thromboembolism) ([place]) 02/2014  . Persistent atrial fibrillation ([facility])         Family History  Problem Relation Age of Onset  . Heart disease Mother   . Diabetes Mother   . Stroke Mother   . Diabetes Father     SOCIAL HISTORY: Social History   Tobacco Use  . Smoking status: Former Smoker    Packs/day: 0.50    Years: 35.00    Pack years: 17.50    Types: Cigarettes    Last attempt to quit: 02/12/2013    Years since quitting: 4.0  . Smokeless tobacco: Never Used  Substance Use Topics  . Alcohol use: No    Comment:  07/21/2014 "never drank much; might have a couple drinks/yr since MI 02/2013"    No Known Allergies  Current Outpatient Medications  Medication Sig Dispense Refill  . acetaminophen (TYLENOL) 650 MG CR tablet Take 650 mg by mouth every 8 (eight) hours as needed for pain.    Marland Kitchen aspirin EC 81 MG tablet Take 81 mg by mouth daily.    Marland Kitchen atorvastatin (LIPITOR) 80 MG tablet TAKE 1 TABLET BY MOUTH EVERY DAY AT 6PM 90 tablet 1  . cyclobenzaprine (FLEXERIL) 10 MG tablet Take 10 mg by mouth 2 (two) times daily as needed for muscle spasms.  0  . docusate sodium (COLACE) 100 MG capsule Take 100 mg by mouth 2 (two) times daily.  5  . dofetilide (TIKOSYN) 250 MCG capsule Take 1 capsule (250 mcg total) 2 (two) times daily by mouth. 180 capsule 3  . DULoxetine (CYMBALTA) 60 MG capsule Take 60 mg by mouth every evening.    . magnesium oxide (MAG-OX) 400 (241.3 MG) MG tablet Take 1 tablet (400 mg total) by mouth daily. (Patient taking differently: Take 400 mg by mouth every evening. ) 30 tablet 11  . metoprolol succinate (TOPROL-XL) 50 MG 24 hr tablet TAKE  1 TABLET (50 MG TOTAL) BY MOUTH DAILY. TAKE WITH OR IMMEDIATELY FOLLOWING A MEAL. 90 tablet 0  . Multiple Vitamin (MULTIVITAMIN WITH MINERALS) TABS tablet Take 1 tablet by mouth daily.     . nitroGLYCERIN (NITROSTAT) 0.4 MG SL tablet PLACE 1 TABLET UNDER TONGUE EVERY 5 MINUTES AS NEEDED FOR CHEST PAIN MAX 3 TABLETS 25 tablet 3  . NON FORMULARY at bedtime. CPAP    . pantoprazole (PROTONIX) 40 MG tablet TAKE 1 TABLET BY MOUTH EVERY DAY 30 tablet 11  . potassium chloride (K-DUR,KLOR-CON) 10 MEQ tablet TAKE 1 TABLET BY MOUTH DAILY 30 tablet 0  . rivaroxaban (XARELTO) 20 MG TABS tablet Take 1 tablet (20 mg total) daily with supper by mouth. 90 tablet 3  . topiramate (TOPAMAX) 50 MG tablet Take 1 tablet (50 mg total) by mouth 2 (two) times daily. Start 1 tablet daily x 1 week then twice daily (Patient not taking: Reported on 01/15/2017) 60 tablet 2   No current  facility-administered medications for this visit.     REVIEW OF SYSTEMS:  [X]  denotes positive finding, [ ]  denotes negative finding Cardiac  Comments:  Chest pain or chest pressure:    Shortness of breath upon exertion: x   Short of breath when lying flat:    Irregular heart rhythm:        Vascular    Pain in calf, thigh, or hip brought on by ambulation: x   Pain in feet at night that wakes you up from your sleep:     Blood clot in your veins:    Leg swelling:         Pulmonary    Oxygen at home:    Productive cough:     Wheezing:         Neurologic    Sudden weakness in arms or legs:     Sudden numbness in arms or legs:     Sudden onset of difficulty speaking or slurred speech:    Temporary loss of vision in one eye:     Problems with dizziness:         Gastrointestinal    Blood in stool:     Vomited blood:         Genitourinary    Burning when urinating:     Blood in urine:        Psychiatric    Major depression:         Hematologic    Bleeding problems:    Problems with blood clotting too easily:        Skin    Rashes or ulcers:        Constitutional    Fever or chills:     PHYSICAL EXAM:   Vitals:   03/04/17 1240  BP: 125/80  Pulse: 69  Resp: 18  Temp: (!) 97.3 F (36.3 C)  TempSrc: Oral  SpO2: 95%  Weight: 265 lb (120.2 kg)  Height: 5\' 7"  (1.702 m)  Body mass index is 41.5 kg/m.   GENERAL: The patient is a well-nourished male, in no acute distress. The vital signs are documented above. CARDIAC: There is a regular rate and rhythm.  VASCULAR: I do not detect carotid bruits. He has palpable femoral pulses.  I cannot palpate popliteal or pedal pulses. PULMONARY: There is good air exchange bilaterally without wheezing or rales. ABDOMEN: Soft and non-tender with normal pitched bowel sounds.  MUSCULOSKELETAL: There are no major deformities or cyanosis. NEUROLOGIC: No focal weakness or  paresthesias are detected. SKIN: There are no ulcers or  rashes noted. PSYCHIATRIC: The patient has a normal affect.  DATA:    AORTOGRAM: I have reviewed his aortogram that was done on 01/26/2017.   On the left side, which is the more symptomatic side hypogastric artery was occluded.  The common and external iliac arteries were patent.  The common and deep femoral arteries were patent.  The superficial femoral artery is occluded at its origin with reconstitution of the popliteal artery at the level of the knee.  There was three-vessel runoff on the left..  On the right side the common iliac, external iliac and hypogastric arteries were patent.  The common femoral and deep femoral arteries were patent.  The superficial femoral artery was occluded at its origin with reconstitution of the above-knee popliteal artery and three-vessel runoff on the right.  LEFT GREAT SAPHENOUS VEIN MAP: I have reviewed his left great saphenous vein map that was done on 01/30/2017.   MEDICAL ISSUES:   BILATERAL LOWER EXTREMITY DISABLING CLAUDICATION: We have discussed conservative treatment including a structured walking program however he feels that his symptoms are preventing him from working.  He is unable to get around the airport and feels like he needs to quit his job at times.  This reason he would like to pursue revascularization.  Given the long segment occlusion of the SFA which goes down to the level of the knee I do not think is a good candidate for an endovascular approach.  I would recommend a left femoral to below-knee popliteal artery bypass.  The vein gets small below the knee and I have explained that if the vein is not adequate we could potentially have to do a prosthetic bypass.  I also explained that he is at increased risk because of his medical comorbidities and obesity.  His BMI is 41.  This certainly puts of increased risk for wound healing problems and infection.  He has not had any recent cardiac symptoms but given his history we will arrange for  preoperative cardiac evaluation by [name].  Once he is cleared we will schedule his surgery.  Because of his work schedule and he does not think he will be able to schedule this until early April.  We will need to stop his Xarelto preoperatively.  I have reviewed the indications for lower extremity bypass. I have also reviewed the potential complications of surgery including but not limited to: wound healing problems, infection, graft thrombosis, limb loss, or other unpredictable medical problems. All the patient's questions were answered and they are agreeable to proceed.   [name] [facility] [phone]

## 2017-03-10 NOTE — Progress Notes (Signed)
Cardiology Office Note   Date:  03/11/2017   ID:  ARGELIO GRANIER, DOB 1960-09-13, MRN [id number]  PCP:  Everardo Beals, NP  Cardiologist: [name]   Chief Complaint  Patient presents with  . Shortness of Breath  . Atrial Fibrillation  . Pre-op Exam     History of Present Illness: Paul Todd is a 57 y.o. male who presents for ongoing assessment and management dyspnea, history PE, one year of anticoagulation for PE, paroxysmal atrial fibrillation, preoperative evaluation on last office visit on 11/24/2016 for back surgery. The patient on last office visit with [name] on 11/24/2016 complaint neuropathy, and was planning another surgery. He was continued on Xarelto for anticoagulation.  The patient has since been seen by Dr. Rae Roam due to bilateral lower extremity pain. The patient underwent aortogram with runoff on 01/26/2017.  Marland Kitchen  There are single renal arteries bilaterally with no significant renal artery stenosis identified. 2.  The infrarenal aorta is widely patent patent. 3.  On the left side, which is the more symptomatic side, the common iliac and external iliac arteries are patent.  The hypogastric artery on the left is occluded.  The common femoral and deep femoral arteries are patent.  The superficial femoral artery is occluded at the origin with reconstitution of the popliteal artery at the level of the knee.  There is three-vessel runoff on the left via the anterior tibial, posterior tibial, and peroneal arteries. 4.  On the right side, the common iliac, external iliac, hypogastric arteries are patent.  The common femoral and deep femoral arteries are patent.  The superficial femoral artery is occluded at its origin with reconstitution of the above-knee popliteal artery.  Three-vessel runoff to the right via the anterior tibial, posterior tibial, and peroneal arteries.  CLINICAL NOTE: He is only options for revascularization would be a femoral to  below-knee popliteal artery bypass on the left and a femoral to above-knee popliteal artery bypass on the right.  Given his obesity I would not be anxious to proceed with surgery unless she developed severe symptoms, rest pain, or nonhealing ulcer.  I think he will be at significant risk for wound healing problems infection.  He would like to discuss this further in the office and I will arrange for an office visit with a vein map of the left great saphenous vein.  As stated above the patient will require femoral to below-the-knee popliteal artery bypass on the left femoral to above-the-knee popliteal artery bypass on the right. He is here for preoperative evaluation. Patient denies chest pain dyspnea on exertion or fatigue. He does complain of intermittent claudication symptoms bilaterally. He has been medically compliant, denies any melena, hemoptysis, or epistaxis. He denies any irregular rhythm or racing heart rate.  Past Medical History:  Diagnosis Date  . Arthritis       . CAD (coronary artery disease)    multivessel per cath 02/12/13, s/p stenting to RCA  . Chronic lower back pain   . Dyslipidemia   . Hx of Bell's palsy   . Hypertension   . Myocardial infarction ([place]) 02/12/2013   . Nephrolithiasis   . OSA on CPAP   . PE (pulmonary thromboembolism) ([place]) 02/2014  . Persistent atrial fibrillation [facility])         Past Surgical History:  Procedure Laterality Date  . ABDOMINAL AORTOGRAM W/LOWER EXTREMITY N/A 01/26/2017   Procedure: ABDOMINAL AORTOGRAM W/LOWER EXTREMITY;  Surgeon: Angelia Mould, MD;  Location: [facility] INVASIVE CV  LAB;  Service: Cardiovascular;  Laterality: N/A;  . CARDIOVERSION N/A 02/15/2013   Procedure: CARDIOVERSION;  Surgeon: Thayer Headings, MD;  Location: [place];  Service: Cardiovascular;  Laterality: N/A;  . CARDIOVERSION N/A 07/07/2014   Procedure: CARDIOVERSION;  Surgeon: Pixie Casino, MD;  Location: [place];  Service: Cardiovascular;  Laterality: N/A;    . CORONARY ANGIOPLASTY WITH STENT PLACEMENT  02/12/2013  . CYSTOSCOPY WITH RETROGRADE PYELOGRAM, URETEROSCOPY AND STENT PLACEMENT Left 09/02/2013   Procedure: CYSTOSCOPY WITH RETROGRADE PYELOGRAM/LEFT URETEROSCOPY/URETERAL STENT, with fulguration;  Surgeon: Festus Aloe, MD;  Location: [facility];  Service: Urology;  Laterality: Left;  . CYSTOSCOPY WITH URETEROSCOPY AND STENT PLACEMENT Left 09/16/2013   Procedure: CYSTOSCOPY WITH URETEROSCOPY AND STENT PLACEMENT;  Surgeon: Festus Aloe, MD;  Location: [facility];  Service: Urology;  Laterality: Left;  . HOLMIUM LASER APPLICATION Left 04/14/8117   Procedure: HOLMIUM LASER APPLICATION;  Surgeon: Festus Aloe, MD;  Location: [facility];  Service: Urology;  Laterality: Left;  . LEFT HEART CATHETERIZATION WITH CORONARY ANGIOGRAM N/A 02/12/2013   Procedure: LEFT HEART CATHETERIZATION WITH CORONARY ANGIOGRAM;  Surgeon: Troy Sine, MD;  Location: [facility] CATH LAB;  Service: Cardiovascular;  Laterality: N/A;  . PERCUTANEOUS CORONARY STENT INTERVENTION (PCI-S)  02/12/2013   Procedure: PERCUTANEOUS CORONARY STENT INTERVENTION (PCI-S);  Surgeon: Troy Sine, MD;  Location: [facility] CATH LAB;  Service: Cardiovascular;;  STEMI  . PERCUTANEOUS CORONARY STENT INTERVENTION (PCI-S) N/A 02/14/2013   Procedure: PERCUTANEOUS CORONARY STENT INTERVENTION (PCI-S);  Surgeon: Burnell Blanks, MD;  Location: [facility] CATH LAB;  Service: Cardiovascular;  Laterality: N/A;  Proximal LAD  . TEE WITHOUT CARDIOVERSION N/A 02/15/2013   Procedure: TRANSESOPHAGEAL ECHOCARDIOGRAM (TEE);  Surgeon: Thayer Headings, MD;  Location: [facility] ENDOSCOPY;  Service: Cardiovascular;  Laterality: N/A;     Current Outpatient Medications  Medication Sig Dispense Refill  . acetaminophen (TYLENOL) 650 MG CR tablet Take 650 mg by mouth every 8 (eight) hours as needed for pain.    Marland Kitchen aspirin EC 81 MG tablet Take 81 mg by mouth daily.    Marland Kitchen atorvastatin (LIPITOR) 80 MG tablet TAKE 1 TABLET BY MOUTH EVERY DAY AT 6PM 90  tablet 1  . cyclobenzaprine (FLEXERIL) 10 MG tablet Take 10 mg by mouth 2 (two) times daily as needed for muscle spasms.  0  . docusate sodium (COLACE) 100 MG capsule Take 100 mg by mouth 2 (two) times daily.  5  . dofetilide (TIKOSYN) 250 MCG capsule Take 1 capsule (250 mcg total) 2 (two) times daily by mouth. 180 capsule 3  . DULoxetine (CYMBALTA) 60 MG capsule Take 60 mg by mouth every evening.    . magnesium oxide (MAG-OX) 400 (241.3 MG) MG tablet Take 1 tablet (400 mg total) by mouth daily. (Patient taking differently: Take 400 mg by mouth every evening. ) 30 tablet 11  . metoprolol succinate (TOPROL-XL) 50 MG 24 hr tablet TAKE 1 TABLET (50 MG TOTAL) BY MOUTH DAILY. TAKE WITH OR IMMEDIATELY FOLLOWING A MEAL. 90 tablet 0  . Multiple Vitamin (MULTIVITAMIN WITH MINERALS) TABS tablet Take 1 tablet by mouth daily.     . nitroGLYCERIN (NITROSTAT) 0.4 MG SL tablet PLACE 1 TABLET UNDER TONGUE EVERY 5 MINUTES AS NEEDED FOR CHEST PAIN MAX 3 TABLETS 25 tablet 3  . NON FORMULARY at bedtime. CPAP    . pantoprazole (PROTONIX) 40 MG tablet TAKE 1 TABLET BY MOUTH EVERY DAY 30 tablet 11  . potassium chloride (K-DUR,KLOR-CON) 10 MEQ tablet TAKE 1 TABLET BY MOUTH DAILY  30 tablet 0  . rivaroxaban (XARELTO) 20 MG TABS tablet Take 1 tablet (20 mg total) daily with supper by mouth. 90 tablet 3   No current facility-administered medications for this visit.     Allergies:   Patient has no known allergies.    Social History:  The patient  reports that he quit smoking about 4 years ago. His smoking use included cigarettes. He has a 17.50 pack-year smoking history. he has never used smokeless tobacco. He reports that he does not drink alcohol or use drugs.   Family History:  The patient's family history includes Diabetes in his father and mother; Heart disease in his mother; Stroke in his mother.    ROS: All other systems are reviewed and negative. Unless otherwise mentioned in H&P    PHYSICAL EXAM: VS:  BP  126/82   Pulse 67   Ht 5\' 7"  (1.702 m)   Wt 266 lb (120.7 kg)   BMI 41.66 kg/m  , BMI Body mass index is 41.66 kg/m. GEN: Well nourished, well developed, in no acute distress  HEENT: normal  Neck: no JVD, carotid bruits, or masses Cardiac: RRR; often 2/6 systolic murmur heard best at the apex, and left sternal border, rubs, or gallops,no edema  Respiratory:  clear to auscultation bilaterally, normal work of breathing GI: soft, nontender, nondistended, + BS MS: no deformity or atrophy  Skin: warm and dry, no rash Neuro:  Strength and sensation are intact Psych: euthymic mood, full affect   EKG:  Normal sinus rhythm heart rate of 67 bpm.  Recent Labs: 05/09/2016: ALT 16; Platelets 181 01/26/2017: BUN 15; Creatinine, Ser 0.80; Hemoglobin 15.0; Potassium 4.2; Sodium 142    Lipid Panel    Component Value Date/Time   CHOL 195 01/28/2013 0939   TRIG 206 (H) 01/28/2013 0939   HDL 43 01/28/2013 0939   CHOLHDL 5.5 11/23/2012 0949   VLDL 46 (H) 11/23/2012 0949   LDLCALC 111 (H) 01/28/2013 0939      Wt Readings from Last 3 Encounters:  03/11/17 266 lb (120.7 kg)  03/04/17 265 lb (120.2 kg)  01/26/17 265 lb (120.2 kg)      Other studies Reviewed: Echocardiogram 03/26/15 Left ventricle: The cavity size was normal. Wall thickness was   normal. Systolic function was normal. The estimated ejection   fraction was in the range of 55% to 60%. Wall motion was normal;   there were no regional wall motion abnormalities. Doppler   parameters are consistent with abnormal left ventricular   relaxation (grade 1 diastolic dysfunction). - Aortic valve: Poorly visualized. Transvalvular velocity was   within the normal range. - Mitral valve: Calcified annulus. There was trivial regurgitation. - Right atrium: Central venous pressure (est): 3 mm Hg. - Atrial septum: No defect or patent foramen ovale was identified. - Tricuspid valve: There was trivial regurgitation. - Pulmonary arteries:  Systolic pressure could not be accurately   estimated. - Pericardium, extracardiac: There was no pericardial effusion.  Impressions:  - Images are limited. Normal LV wall thickness with LVEF 55-60%.   There has been improvement in LVEF in comparison to the prior TEE   from 2015. Probable grade 1 diastolic dysfunction with normal   estimated filling pressure. Mildly calcified mitral annulus with   trivial mitral regurgitation. Trivial tricuspid regurgitation.  Cardiac Cath 02/12/2013 Cardiac cath on 02/12/13 revealed an acutely occluded RCA which was successfully treated with PTCA and diffuse stenting due to RCA stenoses beyond the point of total occlusion. The  patient also had high-grade concomitant proximal LAD disease which required staged PCI. Left ventriculography revealed an ejection fraction of ~50%.     ASSESSMENT AND PLAN:  1.  Paroxysmal atrial fibrillation: The patient is rate controlled on take a thin, and is compliant with Xarelto 20 mg daily. He offers no symptoms of rapid heart rhythm or dyspnea.  2. Preoperative cardiac evaluation for PAD: Patient requires bilateral femoropopliteal bypass, but is planned only for left bypass by Dr. Rachelle Hora. Artery date has not been scheduled until evaluated by cardiology.  He is of low risk from a cardiac standpoint to undergo surgery. He will need to hold his Xarelto or hours prior to procedure. And started as soon as possible thereafter at the comfort level of the surgeon. He is cleared to proceed.  Is been explained to the patient who verbalizes understanding. He will follow-up with his vascular surgeon for timing of surgery so that day of Xarelto cessation temporarily can be identified.  3. CAD: Multivessel disease per cardiac catheterization 02/12/2013. The patient had a repeat nuclear medicine stress test in 2015 which was normal. Some reduction in EF at 48%. He is completely asymptomatic at this time. Medically compliant  4.  Hyperkalemia: The high-dose statin therapy with atorvastatin 80 mg daily. He will need follow-up labs on next appointment.  5. Mild mitral valve calcification. Seen on echocardiogram in 2017 Repeat echocardiogram after follow-up visit  6. Nasal polyps: He is requesting name in referral of the ENT specialist. I'm referring him to [facility]. He will need to follow-up to make this appointment  Current medicines are reviewed at length with the patient today.    Labs/ tests ordered today include: none Phill Myron. West Pugh, ANP, AACC   03/11/2017 11:04 AM    [facility] [address], [place], [place] [address] Phone: [phone]; Fax: [phone]

## 2017-03-11 ENCOUNTER — Encounter: Payer: Self-pay | Admitting: Adult Health

## 2017-03-11 ENCOUNTER — Ambulatory Visit (INDEPENDENT_AMBULATORY_CARE_PROVIDER_SITE_OTHER): Payer: BLUE CROSS/BLUE SHIELD | Admitting: Adult Health

## 2017-03-11 VITALS — BP 126/82 | HR 67 | Ht 67.0 in | Wt 266.0 lb

## 2017-03-11 DIAGNOSIS — I2510 Atherosclerotic heart disease of native coronary artery without angina pectoris: Secondary | ICD-10-CM

## 2017-03-11 DIAGNOSIS — I214 Non-ST elevation (NSTEMI) myocardial infarction: Secondary | ICD-10-CM

## 2017-03-11 DIAGNOSIS — I739 Peripheral vascular disease, unspecified: Secondary | ICD-10-CM | POA: Diagnosis not present

## 2017-03-11 DIAGNOSIS — J339 Nasal polyp, unspecified: Secondary | ICD-10-CM | POA: Diagnosis not present

## 2017-03-11 NOTE — Patient Instructions (Addendum)
Medication Instructions:  NO CHANGES- Your physician recommends that you continue on your current medications as directed. Please refer to the Current Medication list given to you today.  If you need a refill on your cardiac medications before your next appointment, please call your pharmacy.  Special Instructions: Cleared for left fem-pop w/Dr. Scot Dock we will contact-hold Xarelto 48 hours before procedure and re-start ASAP after  Referral to ENT-they will call you to schedule  Follow-Up: Your physician wants you to follow-up in: 3-4 months with Dr Debara Pickett   Thank you for choosing [facility] at [facility]!!

## 2017-03-15 ENCOUNTER — Other Ambulatory Visit: Payer: Self-pay | Admitting: Internal Medicine

## 2017-03-16 ENCOUNTER — Other Ambulatory Visit: Payer: Self-pay | Admitting: *Deleted

## 2017-03-30 NOTE — Pre-Procedure Instructions (Signed)
    Paul Todd  03/30/2017      [id number] [place], [place] [address] [place] [address] Phone: [phone] Fax: [phone]  [facility] [place], [place] [address] Phone: [phone] Fax: [phone]  [facility] [place], [place] to [facility] [employer] [facility] [place] [address] Phone: [phone] Fax: [phone]    Your procedure is scheduled on 04/07/17.  Report to [facility] Admitting at 530 A.M.  Call this number if you have problems the morning of surgery:  [phone]   Remember:  Do not eat food or drink liquids after midnight.  Take these medicines the morning of surgery with A SIP OF WATER --tylenol,tikosyn,metoprolol,protonix   Do not wear jewelry, make-up or nail polish.  Do not wear lotions, powders, or perfumes, or deodorant.  Do not shave 48 hours prior to surgery.  Men may shave face and neck.  Do not bring valuables to the hospital.  [facility] is not responsible for any belongings or valuables.  Contacts, dentures or bridgework may not be worn into surgery.  Leave your suitcase in the car.  After surgery it may be brought to your room.  For patients admitted to the hospital, discharge time will be determined by your treatment team.  Patients discharged the day of surgery will not be allowed to drive home.   Name and phone number of your driver:   Special instructions:    Please read over the following fact sheets that you were given. MRSA Information

## 2017-03-31 ENCOUNTER — Other Ambulatory Visit: Payer: Self-pay

## 2017-03-31 ENCOUNTER — Encounter (HOSPITAL_COMMUNITY): Payer: Self-pay

## 2017-03-31 ENCOUNTER — Encounter (HOSPITAL_COMMUNITY)
Admission: RE | Admit: 2017-03-31 | Discharge: 2017-03-31 | Disposition: A | Discharge status: Home / Self Care | Payer: BLUE CROSS/BLUE SHIELD | Source: Ambulatory Visit | Attending: Vascular Surgery | Admitting: Vascular Surgery

## 2017-03-31 DIAGNOSIS — E785 Hyperlipidemia, unspecified: Secondary | ICD-10-CM | POA: Insufficient documentation

## 2017-03-31 DIAGNOSIS — Z86711 Personal history of pulmonary embolism: Secondary | ICD-10-CM | POA: Insufficient documentation

## 2017-03-31 DIAGNOSIS — Z79899 Other long term (current) drug therapy: Secondary | ICD-10-CM | POA: Insufficient documentation

## 2017-03-31 DIAGNOSIS — G4733 Obstructive sleep apnea (adult) (pediatric): Secondary | ICD-10-CM | POA: Insufficient documentation

## 2017-03-31 DIAGNOSIS — I2510 Atherosclerotic heart disease of native coronary artery without angina pectoris: Secondary | ICD-10-CM | POA: Insufficient documentation

## 2017-03-31 DIAGNOSIS — Z01812 Encounter for preprocedural laboratory examination: Secondary | ICD-10-CM | POA: Insufficient documentation

## 2017-03-31 DIAGNOSIS — I10 Essential (primary) hypertension: Secondary | ICD-10-CM | POA: Diagnosis not present

## 2017-03-31 DIAGNOSIS — Z7982 Long term (current) use of aspirin: Secondary | ICD-10-CM | POA: Insufficient documentation

## 2017-03-31 DIAGNOSIS — Z87891 Personal history of nicotine dependence: Secondary | ICD-10-CM | POA: Diagnosis not present

## 2017-03-31 DIAGNOSIS — Z7901 Long term (current) use of anticoagulants: Secondary | ICD-10-CM | POA: Diagnosis not present

## 2017-03-31 DIAGNOSIS — I4891 Unspecified atrial fibrillation: Secondary | ICD-10-CM | POA: Diagnosis not present

## 2017-03-31 LAB — URINALYSIS, ROUTINE W REFLEX MICROSCOPIC
Bilirubin Urine: NEGATIVE
Glucose, UA: NEGATIVE mg/dL
Hgb urine dipstick: NEGATIVE
Ketones, ur: NEGATIVE mg/dL
LEUKOCYTES UA: NEGATIVE
NITRITE: NEGATIVE
PH: 6 (ref 5.0–8.0)
Protein, ur: NEGATIVE mg/dL
SPECIFIC GRAVITY, URINE: 1.01 (ref 1.005–1.030)

## 2017-03-31 LAB — TYPE AND SCREEN
ABO/RH(D): A NEG
ANTIBODY SCREEN: NEGATIVE

## 2017-03-31 LAB — COMPREHENSIVE METABOLIC PANEL
ALK PHOS: 88 U/L (ref 38–126)
ALT: 28 U/L (ref 17–63)
AST: 23 U/L (ref 15–41)
Albumin: 3.8 g/dL (ref 3.5–5.0)
Anion gap: 10 (ref 5–15)
BILIRUBIN TOTAL: 0.9 mg/dL (ref 0.3–1.2)
BUN: 13 mg/dL (ref 6–20)
CALCIUM: 9.1 mg/dL (ref 8.9–10.3)
CO2: 22 mmol/L (ref 22–32)
CREATININE: 0.78 mg/dL (ref 0.61–1.24)
Chloride: 106 mmol/L (ref 101–111)
GFR calc non Af Amer: >60 mL/min (ref >60)
GLUCOSE: 151 mg/dL — AB (ref 65–99)
Potassium: 4.2 mmol/L (ref 3.5–5.1)
Sodium: 138 mmol/L (ref 135–145)
Total Protein: 7 g/dL (ref 6.5–8.1)

## 2017-03-31 LAB — CBC
HEMATOCRIT: 45.5 % (ref 39.0–52.0)
HEMOGLOBIN: 15 g/dL (ref 13.0–17.0)
MCH: 30.9 pg (ref 26.0–34.0)
MCHC: 33 g/dL (ref 30.0–36.0)
MCV: 93.6 fL (ref 78.0–100.0)
Platelets: 226 10*3/uL (ref 150–400)
RBC: 4.86 MIL/uL (ref 4.22–5.81)
RDW: 13.8 % (ref 11.5–15.5)
WBC: 10 10*3/uL (ref 4.0–10.5)

## 2017-03-31 LAB — ABO/RH: ABO/RH(D): A NEG

## 2017-03-31 LAB — SURGICAL PCR SCREEN
MRSA, PCR: NEGATIVE
STAPHYLOCOCCUS AUREUS: NEGATIVE

## 2017-03-31 NOTE — Progress Notes (Signed)
PCP - Dr. Christen Butter  Cardiologist - [name] NP Jory Sims clearance note 3/6  Chest x-ray - 05/09/16 (E)  EKG - 03/11/17 (E)  Stress Test - 02/02/13 (E)  ECHO - 03/09/15 (E)  Cardiac Cath - 02/2013 (E)  Sleep Study - Yes- Positive CPAP - Yes- Told to bring mask dos  LABS- 03/31/17: CBC, CMP, T/S, UA 04/07/17: PT, PTT  Pt is to continue Aspirin and take last dose of Xarelto on 3/31  Anesthesia- Yes- Cardiac history  Pt denies having chest pain, sob, or fever at this time. All instructions explained to the pt, with a verbal understanding of the material. Pt agrees to go over the instructions while at home for a better understanding. The opportunity to ask questions was provided.

## 2017-03-31 NOTE — Pre-Procedure Instructions (Signed)
Paul Todd  03/31/2017      [id number] [place], [place] - [place] [address] [place] [address] Phone: [phone] Fax: [phone]  [facility] [place], [place] [address] Phone: [phone] Fax: [phone]  [facility] [place], [place] to [facility] [employer] [place] [address] Phone: [phone] Fax: [phone]    Your procedure is scheduled on Tues., April 07, 2017  Report to [facility] Admitting Entrance "A" at 5:30AM  Call this number if you have problems the morning of surgery:  [phone]   Remember:  Do not eat food or drink liquids after midnight.  Take these medicines the morning of surgery with A SIP OF WATER: Metoprolol succinate (TOPROL-XL) and Pantoprazole (PROTONIX), and Docusate sodium (COLACE). If needed Acetaminophen (TYLENOL) for pain and NitroGLYCERIN (NITROSTAT) for chest pain (Notifiy the nurse if you had to take this medicine).  Follow your doctor's instruction regarding Aspirin and Xarelto.  As of today, stop taking all Aspirins, Vitamins, Fish oils, and Herbal medications. Also stop all NSAIDS i.e. Advil, Ibuprofen, Motrin, Aleve, Anaprox, Naproxen, BC and Goody Powders.   Do not wear jewelry.  Do not wear lotions, powders, colognes, or deodorant.  Do not shave 48 hours prior to surgery.  Men may shave face and neck.  Do not bring valuables to the hospital.  [facility] is not responsible for any belongings or valuables.  Contacts, dentures or bridgework may not be worn into surgery.  Leave your suitcase in the car.  After surgery it may be brought to your room.  For patients admitted to the hospital, discharge time will be determined by your treatment  team.  Patients discharged the day of surgery will not be allowed to drive home.   Special instructions:   Index- Preparing For Surgery  Before surgery, you can play an important role. Because skin is not sterile, your skin needs to be as free of germs as possible. You can reduce the number of germs on your skin by washing with CHG (chlorahexidine gluconate) Soap before surgery.  CHG is an antiseptic cleaner which kills germs and bonds with the skin to continue killing germs even after washing.  Please do not use if you have an allergy to CHG or antibacterial soaps. If your skin becomes reddened/irritated stop using the CHG.  Do not shave (including legs and underarms) for at least 48 hours prior to first CHG shower. It is OK to shave your face.  Please follow these instructions carefully.   1. Shower the NIGHT BEFORE SURGERY and the MORNING OF SURGERY with CHG.   2. If you chose to wash your hair, wash your hair first as usual with your normal shampoo.  3. After you shampoo, rinse your hair and body thoroughly to remove the shampoo.  4. Use CHG as you would any other liquid soap. You can apply CHG directly to the skin and wash gently with a scrungie or a clean washcloth.   5. Apply the CHG Soap to your body ONLY FROM THE NECK DOWN.  Do not use on open wounds or open sores. Avoid contact with your eyes, ears, mouth and genitals (private parts). Wash Face and genitals (private parts)  with your normal  soap.  6. Wash thoroughly, paying special attention to the area where your surgery will be performed.  7. Thoroughly rinse your body with warm water from the neck down.  8. DO NOT shower/wash with your normal soap after using and rinsing off the CHG Soap.  9. Pat yourself dry with a CLEAN TOWEL.  10. Wear CLEAN PAJAMAS to bed the night before surgery, wear comfortable clothes the morning of surgery  11. Place CLEAN SHEETS on your bed the night of your first shower and DO NOT SLEEP  WITH PETS.  Day of Surgery: Do not apply any deodorants/lotions. Please wear clean clothes to the hospital/surgery center.    Please read over the following fact sheets that you were given. Pain Booklet, Coughing and Deep Breathing, MRSA Information and Surgical Site Infection Prevention

## 2017-04-01 DIAGNOSIS — H6122 Impacted cerumen, left ear: Secondary | ICD-10-CM | POA: Diagnosis not present

## 2017-04-01 DIAGNOSIS — H6062 Unspecified chronic otitis externa, left ear: Secondary | ICD-10-CM | POA: Diagnosis not present

## 2017-04-01 NOTE — Progress Notes (Signed)
Anesthesia Chart Review:  Pt is a 57 year old male scheduled for left femoral-below-knee popliteal bypass graft on 04/07/2017 with Joylene Igo, MD  - PCP is Everardo Beals, NP - Cardiologist is Lyman Bishop, MD. Pt cleared for surgery at low risk at last office visit 03/11/17 by Jory Sims, NP  PMH includes: CAD (DES x2 to RCA and DES to LAD in [id number]), atrial fibrillation, HTN, dyslipidemia, PE (2016), OSA.  Former smoker (quit 2015).  BMI 42.  Medications include: ASA 81 mg, Lipitor, Tikosyn, metoprolol, Protonix, potassium, Xarelto. Pt to hold Xarelto 48 hours prior to surgery.  BP (!) 158/85   Pulse 70   Temp 36.8 C   Resp 20   Ht 5\' 7"  (1.702 m)   Wt 266 lb 3.2 oz (120.7 kg)   SpO2 96%   BMI 41.69 kg/m   Preoperative labs reviewed.   - PT/PTT will be obtained day of surgery  CXR 05/09/16: No active cardiopulmonary disease.  EKG 03/11/17: NSR  Echo 03/09/15:  - Left ventricle: The cavity size was normal. Wall thickness was normal. Systolic function was normal. The estimated ejection fraction was in the range of 55% to 60%. Wall motion was normal; there were no regional wall motion abnormalities. Doppler parameters are consistent with abnormal left ventricular relaxation (grade 1 diastolic dysfunction). - Aortic valve: Poorly visualized. Transvalvular velocity was within the normal range. - Mitral valve: Calcified annulus. There was trivial regurgitation. - Right atrium: Central venous pressure (est): 3 mm Hg. - Atrial septum: No defect or patent foramen ovale was identified. - Tricuspid valve: There was trivial regurgitation. - Pulmonary arteries: Systolic pressure could not be accurately estimated. - Pericardium, extracardiac: There was no pericardial effusion. - Impressions: Images are limited. Normal LV wall thickness with LVEF 55-60%. There has been improvement in LVEF in comparison to the prior TEE from 2015. Probable grade 1 diastolic dysfunction with normal  estimated filling pressure. Mildly calcified mitral annulus with trivial mitral regurgitation. Trivial tricuspid regurgitation.  Cardiac cath 02/12/13: 1.LM: Smooth 30 - 40% distal taper of the left main. 2. LAD: 85% somewhat eccentric very proximal stenosis in the region of the first septal and diagonal vessel. LAD extended to the LV apex.. S/p DES to LAD 02/14/13 3. Ramus Intermediate: Angiographically normal vessel 4. Left CX: Angiographically normal vessel with mild luminal irregularity in the region of the trifurcation of a obtuse marginal branch, AV groove circumflex, and left atrial branch.  5. RCA: totally occluded in the proximal to mid segment. There was evidence for left-to-right collaterals. S/p PTCA and DES x2 to RCA  If labs acceptable day of surgery, I anticipate pt can proceed with surgery as scheduled.   Willeen Cass, FNP-BC [facility] Short Stay Surgical Center/Anesthesiology Phone: [phone] 04/01/2017 4:02 PM

## 2017-04-06 MED ORDER — DEXTROSE 5 % IV SOLN
3.0000 g | INTRAVENOUS | Status: AC
  Administered 2017-04-07 (×2): 3 g via INTRAVENOUS
  Filled 2017-04-06: qty 3

## 2017-04-07 ENCOUNTER — Other Ambulatory Visit: Payer: Self-pay

## 2017-04-07 ENCOUNTER — Encounter (HOSPITAL_COMMUNITY): Payer: Self-pay | Admitting: Surgery

## 2017-04-07 ENCOUNTER — Inpatient Hospital Stay (HOSPITAL_COMMUNITY)
Admission: RE | Admit: 2017-04-07 | Discharge: 2017-04-10 | DRG: 253 | Disposition: A | Discharge status: Home / Self Care | Payer: BLUE CROSS/BLUE SHIELD | Source: Ambulatory Visit | Attending: Vascular Surgery | Admitting: Vascular Surgery

## 2017-04-07 ENCOUNTER — Encounter (HOSPITAL_COMMUNITY)
Admission: RE | Disposition: A | Discharge status: Home / Self Care | Payer: Self-pay | Source: Ambulatory Visit | Attending: Vascular Surgery

## 2017-04-07 ENCOUNTER — Inpatient Hospital Stay (HOSPITAL_COMMUNITY): Payer: BLUE CROSS/BLUE SHIELD

## 2017-04-07 ENCOUNTER — Inpatient Hospital Stay (HOSPITAL_COMMUNITY): Payer: BLUE CROSS/BLUE SHIELD | Admitting: Emergency Medicine

## 2017-04-07 DIAGNOSIS — I481 Persistent atrial fibrillation: Secondary | ICD-10-CM | POA: Diagnosis present

## 2017-04-07 DIAGNOSIS — Z79899 Other long term (current) drug therapy: Secondary | ICD-10-CM | POA: Diagnosis not present

## 2017-04-07 DIAGNOSIS — I252 Old myocardial infarction: Secondary | ICD-10-CM

## 2017-04-07 DIAGNOSIS — Z9889 Other specified postprocedural states: Secondary | ICD-10-CM | POA: Diagnosis not present

## 2017-04-07 DIAGNOSIS — G4733 Obstructive sleep apnea (adult) (pediatric): Secondary | ICD-10-CM | POA: Diagnosis not present

## 2017-04-07 DIAGNOSIS — I70209 Unspecified atherosclerosis of native arteries of extremities, unspecified extremity: Secondary | ICD-10-CM | POA: Diagnosis not present

## 2017-04-07 DIAGNOSIS — I255 Ischemic cardiomyopathy: Secondary | ICD-10-CM | POA: Diagnosis not present

## 2017-04-07 DIAGNOSIS — Z87891 Personal history of nicotine dependence: Secondary | ICD-10-CM

## 2017-04-07 DIAGNOSIS — I10 Essential (primary) hypertension: Secondary | ICD-10-CM | POA: Diagnosis not present

## 2017-04-07 DIAGNOSIS — E785 Hyperlipidemia, unspecified: Secondary | ICD-10-CM | POA: Diagnosis present

## 2017-04-07 DIAGNOSIS — I739 Peripheral vascular disease, unspecified: Secondary | ICD-10-CM | POA: Diagnosis not present

## 2017-04-07 DIAGNOSIS — I70202 Unspecified atherosclerosis of native arteries of extremities, left leg: Secondary | ICD-10-CM | POA: Diagnosis not present

## 2017-04-07 DIAGNOSIS — I2510 Atherosclerotic heart disease of native coronary artery without angina pectoris: Secondary | ICD-10-CM | POA: Diagnosis present

## 2017-04-07 DIAGNOSIS — I70212 Atherosclerosis of native arteries of extremities with intermittent claudication, left leg: Secondary | ICD-10-CM | POA: Diagnosis not present

## 2017-04-07 DIAGNOSIS — Z7901 Long term (current) use of anticoagulants: Secondary | ICD-10-CM | POA: Diagnosis not present

## 2017-04-07 DIAGNOSIS — Z7982 Long term (current) use of aspirin: Secondary | ICD-10-CM

## 2017-04-07 DIAGNOSIS — Z86711 Personal history of pulmonary embolism: Secondary | ICD-10-CM | POA: Diagnosis not present

## 2017-04-07 DIAGNOSIS — Z01810 Encounter for preprocedural cardiovascular examination: Secondary | ICD-10-CM | POA: Diagnosis not present

## 2017-04-07 DIAGNOSIS — Z09 Encounter for follow-up examination after completed treatment for conditions other than malignant neoplasm: Secondary | ICD-10-CM

## 2017-04-07 HISTORY — PX: FEMORAL BYPASS: SHX50

## 2017-04-07 HISTORY — PX: FEMORAL-POPLITEAL BYPASS GRAFT: SHX937

## 2017-04-07 LAB — PROTIME-INR
INR: 1
PROTHROMBIN TIME: 13.1 s (ref 11.4–15.2)

## 2017-04-07 LAB — APTT: APTT: 28 s (ref 24–36)

## 2017-04-07 LAB — CBC
HEMATOCRIT: 41.6 % (ref 39.0–52.0)
Hemoglobin: 13.4 g/dL (ref 13.0–17.0)
MCH: 30 pg (ref 26.0–34.0)
MCHC: 32.2 g/dL (ref 30.0–36.0)
MCV: 93.3 fL (ref 78.0–100.0)
Platelets: 215 10*3/uL (ref 150–400)
RBC: 4.46 MIL/uL (ref 4.22–5.81)
RDW: 13.3 % (ref 11.5–15.5)
WBC: 14 10*3/uL — ABNORMAL HIGH (ref 4.0–10.5)

## 2017-04-07 LAB — CREATININE, SERUM
CREATININE: 0.87 mg/dL (ref 0.61–1.24)
GFR calc Af Amer: >60 mL/min (ref >60)

## 2017-04-07 SURGERY — BYPASS GRAFT FEMORAL-POPLITEAL ARTERY
Anesthesia: General | Site: Leg Upper | Laterality: Left

## 2017-04-07 MED ORDER — DEXAMETHASONE SODIUM PHOSPHATE 10 MG/ML IJ SOLN
INTRAMUSCULAR | Status: AC
  Filled 2017-04-07: qty 1

## 2017-04-07 MED ORDER — BISACODYL 5 MG PO TBEC: 5.0000 mg | DELAYED_RELEASE_TABLET | Freq: Every day | ORAL | Status: DC | PRN

## 2017-04-07 MED ORDER — ONDANSETRON HCL 4 MG/2ML IJ SOLN
INTRAMUSCULAR | Status: DC | PRN
  Administered 2017-04-07: 4 mg via INTRAVENOUS

## 2017-04-07 MED ORDER — SENNOSIDES-DOCUSATE SODIUM 8.6-50 MG PO TABS: 1.0000 | ORAL_TABLET | Freq: Every evening | ORAL | Status: DC | PRN

## 2017-04-07 MED ORDER — SODIUM CHLORIDE 0.9 % IV SOLN
INTRAVENOUS | Status: DC | PRN
  Administered 2017-04-07: 500 mL

## 2017-04-07 MED ORDER — PANTOPRAZOLE SODIUM 40 MG PO TBEC
40.0000 mg | DELAYED_RELEASE_TABLET | Freq: Every day | ORAL | Status: DC
  Administered 2017-04-08 – 2017-04-10 (×3): 40 mg via ORAL
  Filled 2017-04-07 (×3): qty 1

## 2017-04-07 MED ORDER — ADULT MULTIVITAMIN W/MINERALS CH
1.0000 | ORAL_TABLET | Freq: Every day | ORAL | Status: DC
  Administered 2017-04-08 – 2017-04-10 (×3): 1 via ORAL
  Filled 2017-04-07 (×3): qty 1

## 2017-04-07 MED ORDER — MIDAZOLAM HCL 2 MG/2ML IJ SOLN
INTRAMUSCULAR | Status: AC
  Filled 2017-04-07: qty 2

## 2017-04-07 MED ORDER — DEXAMETHASONE SODIUM PHOSPHATE 10 MG/ML IJ SOLN
INTRAMUSCULAR | Status: DC | PRN
  Administered 2017-04-07: 10 mg via INTRAVENOUS

## 2017-04-07 MED ORDER — LIDOCAINE HCL (CARDIAC) 20 MG/ML IV SOLN
INTRAVENOUS | Status: AC
  Filled 2017-04-07: qty 5

## 2017-04-07 MED ORDER — POTASSIUM CHLORIDE CRYS ER 10 MEQ PO TBCR
10.0000 meq | EXTENDED_RELEASE_TABLET | Freq: Every day | ORAL | Status: DC
  Administered 2017-04-07 – 2017-04-10 (×4): 10 meq via ORAL
  Filled 2017-04-07 (×4): qty 1

## 2017-04-07 MED ORDER — MAGNESIUM SULFATE 2 GM/50ML IV SOLN: 2.0000 g | Freq: Every day | INTRAVENOUS | Status: DC | PRN

## 2017-04-07 MED ORDER — ROCURONIUM BROMIDE 10 MG/ML (PF) SYRINGE
PREFILLED_SYRINGE | INTRAVENOUS | Status: AC
  Filled 2017-04-07: qty 5

## 2017-04-07 MED ORDER — PAPAVERINE HCL 30 MG/ML IJ SOLN
INTRAMUSCULAR | Status: AC
  Filled 2017-04-07: qty 2

## 2017-04-07 MED ORDER — HYDROMORPHONE HCL 1 MG/ML IJ SOLN: 0.2500 mg | INTRAMUSCULAR | Status: DC | PRN

## 2017-04-07 MED ORDER — ROCURONIUM BROMIDE 100 MG/10ML IV SOLN
INTRAVENOUS | Status: DC | PRN
  Administered 2017-04-07: 50 mg via INTRAVENOUS
  Administered 2017-04-07 (×2): 30 mg via INTRAVENOUS
  Administered 2017-04-07: 50 mg via INTRAVENOUS
  Administered 2017-04-07: 30 mg via INTRAVENOUS

## 2017-04-07 MED ORDER — METOPROLOL TARTRATE 5 MG/5ML IV SOLN: 2.0000 mg | INTRAVENOUS | Status: DC | PRN

## 2017-04-07 MED ORDER — 0.9 % SODIUM CHLORIDE (POUR BTL) OPTIME
TOPICAL | Status: DC | PRN
  Administered 2017-04-07: 2000 mL

## 2017-04-07 MED ORDER — DOCUSATE SODIUM 100 MG PO CAPS
100.0000 mg | ORAL_CAPSULE | Freq: Two times a day (BID) | ORAL | Status: DC
  Administered 2017-04-07 – 2017-04-10 (×6): 100 mg via ORAL
  Filled 2017-04-07 (×6): qty 1

## 2017-04-07 MED ORDER — ATORVASTATIN CALCIUM 20 MG PO TABS
20.0000 mg | ORAL_TABLET | Freq: Every day | ORAL | Status: DC
  Administered 2017-04-07 – 2017-04-09 (×3): 20 mg via ORAL
  Filled 2017-04-07 (×3): qty 1

## 2017-04-07 MED ORDER — MAGNESIUM OXIDE 400 (241.3 MG) MG PO TABS
400.0000 mg | ORAL_TABLET | Freq: Every evening | ORAL | Status: DC
  Administered 2017-04-07 – 2017-04-09 (×3): 400 mg via ORAL
  Filled 2017-04-07 (×3): qty 1

## 2017-04-07 MED ORDER — CHLORHEXIDINE GLUCONATE CLOTH 2 % EX PADS: 6.0000 | MEDICATED_PAD | Freq: Once | CUTANEOUS | Status: DC

## 2017-04-07 MED ORDER — FENTANYL CITRATE (PF) 250 MCG/5ML IJ SOLN
INTRAMUSCULAR | Status: AC
Start: 2017-04-07 — End: 2017-04-07
  Filled 2017-04-07: qty 5

## 2017-04-07 MED ORDER — IODIXANOL 320 MG/ML IV SOLN
INTRAVENOUS | Status: DC | PRN
  Administered 2017-04-07: 10 mL

## 2017-04-07 MED ORDER — DEXTROSE 5 % IV SOLN: INTRAVENOUS | Status: DC | PRN

## 2017-04-07 MED ORDER — PROMETHAZINE HCL 25 MG/ML IJ SOLN: 6.2500 mg | INTRAMUSCULAR | Status: DC | PRN

## 2017-04-07 MED ORDER — METOPROLOL SUCCINATE ER 50 MG PO TB24
50.0000 mg | ORAL_TABLET | Freq: Every day | ORAL | Status: DC
  Administered 2017-04-08 – 2017-04-10 (×3): 50 mg via ORAL
  Filled 2017-04-07 (×3): qty 1

## 2017-04-07 MED ORDER — ACETAMINOPHEN 325 MG PO TABS
325.0000 mg | ORAL_TABLET | ORAL | Status: DC | PRN
  Administered 2017-04-09 – 2017-04-10 (×2): 650 mg via ORAL
  Filled 2017-04-07 (×2): qty 2

## 2017-04-07 MED ORDER — SUGAMMADEX SODIUM 500 MG/5ML IV SOLN
INTRAVENOUS | Status: AC
  Filled 2017-04-07: qty 5

## 2017-04-07 MED ORDER — GUAIFENESIN-DM 100-10 MG/5ML PO SYRP: 15.0000 mL | ORAL_SOLUTION | ORAL | Status: DC | PRN

## 2017-04-07 MED ORDER — HYDRALAZINE HCL 20 MG/ML IJ SOLN: 5.0000 mg | INTRAMUSCULAR | Status: DC | PRN

## 2017-04-07 MED ORDER — SODIUM CHLORIDE 0.9 % IV SOLN
INTRAVENOUS | Status: AC
  Filled 2017-04-07: qty 1.2

## 2017-04-07 MED ORDER — HYDROMORPHONE HCL 1 MG/ML IJ SOLN
0.2500 mg | INTRAMUSCULAR | Status: DC | PRN
  Administered 2017-04-07 (×2): 0.5 mg via INTRAVENOUS

## 2017-04-07 MED ORDER — PROPOFOL 10 MG/ML IV BOLUS
INTRAVENOUS | Status: DC | PRN
  Administered 2017-04-07: 130 mg via INTRAVENOUS

## 2017-04-07 MED ORDER — OXYCODONE HCL 5 MG PO TABS
5.0000 mg | ORAL_TABLET | ORAL | Status: DC | PRN
  Administered 2017-04-07: 10 mg via ORAL
  Administered 2017-04-07: 5 mg via ORAL
  Administered 2017-04-08 – 2017-04-10 (×10): 10 mg via ORAL
  Filled 2017-04-07 (×12): qty 2

## 2017-04-07 MED ORDER — NITROGLYCERIN 0.4 MG SL SUBL: 0.4000 mg | SUBLINGUAL_TABLET | SUBLINGUAL | Status: DC | PRN

## 2017-04-07 MED ORDER — SODIUM CHLORIDE 0.9 % IV SOLN: 500.0000 mL | Freq: Once | INTRAVENOUS | Status: DC | PRN

## 2017-04-07 MED ORDER — PROTAMINE SULFATE 10 MG/ML IV SOLN
INTRAVENOUS | Status: DC | PRN
  Administered 2017-04-07: 20 mg via INTRAVENOUS
  Administered 2017-04-07: 40 mg via INTRAVENOUS

## 2017-04-07 MED ORDER — ACETAMINOPHEN 325 MG RE SUPP: 325.0000 mg | RECTAL | Status: DC | PRN

## 2017-04-07 MED ORDER — HYDROMORPHONE HCL 1 MG/ML IJ SOLN
INTRAMUSCULAR | Status: AC
  Filled 2017-04-07: qty 1

## 2017-04-07 MED ORDER — CEFAZOLIN SODIUM 1 G IJ SOLR
INTRAMUSCULAR | Status: AC
  Filled 2017-04-07: qty 20

## 2017-04-07 MED ORDER — FLEET ENEMA 7-19 GM/118ML RE ENEM: 1.0000 | ENEMA | Freq: Once | RECTAL | Status: DC | PRN

## 2017-04-07 MED ORDER — CEFAZOLIN SODIUM-DEXTROSE 2-4 GM/100ML-% IV SOLN
2.0000 g | Freq: Three times a day (TID) | INTRAVENOUS | Status: AC
  Administered 2017-04-07 – 2017-04-08 (×2): 2 g via INTRAVENOUS
  Filled 2017-04-07 (×2): qty 100

## 2017-04-07 MED ORDER — NITROGLYCERIN 0.3 MG SL SUBL
0.3000 mg | SUBLINGUAL_TABLET | SUBLINGUAL | Status: DC | PRN
  Filled 2017-04-07: qty 100

## 2017-04-07 MED ORDER — ENOXAPARIN SODIUM 40 MG/0.4ML ~~LOC~~ SOLN
40.0000 mg | SUBCUTANEOUS | Status: DC
  Administered 2017-04-08: 40 mg via SUBCUTANEOUS
  Filled 2017-04-07: qty 0.4

## 2017-04-07 MED ORDER — ONDANSETRON HCL 4 MG/2ML IJ SOLN: 4.0000 mg | Freq: Four times a day (QID) | INTRAMUSCULAR | Status: DC | PRN

## 2017-04-07 MED ORDER — ORAL CARE MOUTH RINSE
15.0000 mL | Freq: Two times a day (BID) | OROMUCOSAL | Status: DC
  Administered 2017-04-08 (×2): 15 mL via OROMUCOSAL

## 2017-04-07 MED ORDER — LIDOCAINE HCL (CARDIAC) 20 MG/ML IV SOLN
INTRAVENOUS | Status: DC | PRN
  Administered 2017-04-07: 100 mg via INTRAVENOUS

## 2017-04-07 MED ORDER — POTASSIUM CHLORIDE CRYS ER 20 MEQ PO TBCR: 20.0000 meq | EXTENDED_RELEASE_TABLET | Freq: Every day | ORAL | Status: DC | PRN

## 2017-04-07 MED ORDER — SODIUM CHLORIDE 0.9 % IV SOLN: INTRAVENOUS | Status: DC

## 2017-04-07 MED ORDER — DOFETILIDE 125 MCG PO CAPS
250.0000 ug | ORAL_CAPSULE | Freq: Two times a day (BID) | ORAL | Status: DC
  Administered 2017-04-07 – 2017-04-10 (×6): 250 ug via ORAL
  Filled 2017-04-07 (×6): qty 2

## 2017-04-07 MED ORDER — ALUM & MAG HYDROXIDE-SIMETH 200-200-20 MG/5ML PO SUSP: 15.0000 mL | ORAL | Status: DC | PRN

## 2017-04-07 MED ORDER — PHENOL 1.4 % MT LIQD: 1.0000 | OROMUCOSAL | Status: DC | PRN

## 2017-04-07 MED ORDER — DULOXETINE HCL 60 MG PO CPEP
60.0000 mg | ORAL_CAPSULE | Freq: Every evening | ORAL | Status: DC
  Administered 2017-04-07 – 2017-04-09 (×3): 60 mg via ORAL
  Filled 2017-04-07 (×3): qty 1

## 2017-04-07 MED ORDER — HEPARIN SODIUM (PORCINE) 1000 UNIT/ML IJ SOLN
INTRAMUSCULAR | Status: AC
  Filled 2017-04-07: qty 1

## 2017-04-07 MED ORDER — FENTANYL CITRATE (PF) 250 MCG/5ML IJ SOLN
INTRAMUSCULAR | Status: AC
  Filled 2017-04-07: qty 5

## 2017-04-07 MED ORDER — ALBUMIN HUMAN 5 % IV SOLN
INTRAVENOUS | Status: DC | PRN
  Administered 2017-04-07: 10:00:00 via INTRAVENOUS

## 2017-04-07 MED ORDER — HEPARIN SODIUM (PORCINE) 1000 UNIT/ML IJ SOLN
INTRAMUSCULAR | Status: DC | PRN
  Administered 2017-04-07: 10000 [IU] via INTRAVENOUS

## 2017-04-07 MED ORDER — SUGAMMADEX SODIUM 500 MG/5ML IV SOLN
INTRAVENOUS | Status: DC | PRN
  Administered 2017-04-07: 250 mg via INTRAVENOUS

## 2017-04-07 MED ORDER — FENTANYL CITRATE (PF) 100 MCG/2ML IJ SOLN
INTRAMUSCULAR | Status: DC | PRN
  Administered 2017-04-07 (×4): 50 ug via INTRAVENOUS
  Administered 2017-04-07: 150 ug via INTRAVENOUS
  Administered 2017-04-07: 50 ug via INTRAVENOUS

## 2017-04-07 MED ORDER — MORPHINE SULFATE (PF) 2 MG/ML IV SOLN
1.0000 mg | INTRAVENOUS | Status: DC | PRN
  Administered 2017-04-07: 1 mg via INTRAVENOUS
  Administered 2017-04-08: 2 mg via INTRAVENOUS
  Filled 2017-04-07 (×2): qty 1

## 2017-04-07 MED ORDER — LABETALOL HCL 5 MG/ML IV SOLN: 10.0000 mg | INTRAVENOUS | Status: DC | PRN

## 2017-04-07 MED ORDER — SODIUM CHLORIDE 0.9 % IV SOLN
INTRAVENOUS | Status: DC
  Administered 2017-04-07: 125 mL/h via INTRAVENOUS

## 2017-04-07 MED ORDER — MIDAZOLAM HCL 5 MG/5ML IJ SOLN
INTRAMUSCULAR | Status: DC | PRN
  Administered 2017-04-07: 2 mg via INTRAVENOUS

## 2017-04-07 MED ORDER — LACTATED RINGERS IV SOLN
INTRAVENOUS | Status: DC | PRN
  Administered 2017-04-07 (×3): via INTRAVENOUS

## 2017-04-07 MED ORDER — ONDANSETRON HCL 4 MG/2ML IJ SOLN
INTRAMUSCULAR | Status: AC
  Filled 2017-04-07: qty 2

## 2017-04-07 MED ORDER — ASPIRIN EC 81 MG PO TBEC
81.0000 mg | DELAYED_RELEASE_TABLET | Freq: Every day | ORAL | Status: DC
  Administered 2017-04-07 – 2017-04-10 (×4): 81 mg via ORAL
  Filled 2017-04-07 (×4): qty 1

## 2017-04-07 MED ORDER — PAPAVERINE HCL 30 MG/ML IJ SOLN
INTRAMUSCULAR | Status: DC | PRN
  Administered 2017-04-07: 60 mg via INTRAVENOUS

## 2017-04-07 SURGICAL SUPPLY — 68 items
BANDAGE ESMARK 6X9 LF (GAUZE/BANDAGES/DRESSINGS) ×1 IMPLANT
BNDG ESMARK 6X9 LF (GAUZE/BANDAGES/DRESSINGS) ×3
CANISTER SUCT 3000ML PPV (MISCELLANEOUS) ×3 IMPLANT
CANNULA VESSEL 3MM 2 BLNT TIP (CANNULA) ×9 IMPLANT
CLIP VESOCCLUDE MED 24/CT (CLIP) ×3 IMPLANT
CLIP VESOCCLUDE SM WIDE 24/CT (CLIP) ×6 IMPLANT
COVER PROBE W GEL 5X96 (DRAPES) ×3 IMPLANT
CUFF TOURNIQUET SINGLE 24IN (TOURNIQUET CUFF) IMPLANT
CUFF TOURNIQUET SINGLE 34IN LL (TOURNIQUET CUFF) ×3 IMPLANT
CUFF TOURNIQUET SINGLE 44IN (TOURNIQUET CUFF) IMPLANT
DERMABOND ADVANCED (GAUZE/BANDAGES/DRESSINGS) ×10
DERMABOND ADVANCED .7 DNX12 (GAUZE/BANDAGES/DRESSINGS) ×5 IMPLANT
DRAIN CHANNEL 15F RND FF W/TCR (WOUND CARE) ×6 IMPLANT
DRAPE HALF SHEET 40X57 (DRAPES) IMPLANT
DRAPE X-RAY CASS 24X20 (DRAPES) ×3 IMPLANT
ELECT BLADE 4.0 EZ CLEAN MEGAD (MISCELLANEOUS) ×3
ELECT REM PT RETURN 9FT ADLT (ELECTROSURGICAL) ×3
ELECTRODE BLDE 4.0 EZ CLN MEGD (MISCELLANEOUS) ×1 IMPLANT
ELECTRODE REM PT RTRN 9FT ADLT (ELECTROSURGICAL) ×1 IMPLANT
EVACUATOR SILICONE 100CC (DRAIN) ×6 IMPLANT
GAUZE SPONGE 4X4 12PLY STRL LF (GAUZE/BANDAGES/DRESSINGS) ×6 IMPLANT
GAUZE SPONGE 4X4 16PLY XRAY LF (GAUZE/BANDAGES/DRESSINGS) ×6 IMPLANT
GLOVE BIO SURGEON STRL SZ 6.5 (GLOVE) ×4 IMPLANT
GLOVE BIO SURGEON STRL SZ7.5 (GLOVE) ×3 IMPLANT
GLOVE BIO SURGEONS STRL SZ 6.5 (GLOVE) ×2
GLOVE BIOGEL PI IND STRL 6.5 (GLOVE) ×4 IMPLANT
GLOVE BIOGEL PI IND STRL 7.5 (GLOVE) ×1 IMPLANT
GLOVE BIOGEL PI IND STRL 8 (GLOVE) ×1 IMPLANT
GLOVE BIOGEL PI INDICATOR 6.5 (GLOVE) ×8
GLOVE BIOGEL PI INDICATOR 7.5 (GLOVE) ×2
GLOVE BIOGEL PI INDICATOR 8 (GLOVE) ×2
GLOVE ECLIPSE 7.0 STRL STRAW (GLOVE) ×3 IMPLANT
GLOVE SURG SS PI 6.0 STRL IVOR (GLOVE) ×3 IMPLANT
GOWN STRL REUS W/ TWL LRG LVL3 (GOWN DISPOSABLE) ×5 IMPLANT
GOWN STRL REUS W/TWL LRG LVL3 (GOWN DISPOSABLE) ×10
KIT BASIN OR (CUSTOM PROCEDURE TRAY) ×3 IMPLANT
KIT TURNOVER KIT B (KITS) ×3 IMPLANT
LOOP VESSEL MAXI BLUE (MISCELLANEOUS) ×3 IMPLANT
MARKER GRAFT CORONARY BYPASS (MISCELLANEOUS) ×3 IMPLANT
NS IRRIG 1000ML POUR BTL (IV SOLUTION) ×6 IMPLANT
PACK PERIPHERAL VASCULAR (CUSTOM PROCEDURE TRAY) ×3 IMPLANT
PAD ARMBOARD 7.5X6 YLW CONV (MISCELLANEOUS) ×6 IMPLANT
SET COLLECT BLD 21X3/4 12 (NEEDLE) ×3 IMPLANT
SPONGE SURGIFOAM ABS GEL 100 (HEMOSTASIS) IMPLANT
STOPCOCK 4 WAY LG BORE MALE ST (IV SETS) ×3 IMPLANT
SUT ETHILON 3 0 PS 1 (SUTURE) IMPLANT
SUT PROLENE 5 0 C 1 24 (SUTURE) ×9 IMPLANT
SUT PROLENE 6 0 BV (SUTURE) ×18 IMPLANT
SUT SILK 2 0 PERMA HAND 18 BK (SUTURE) ×6 IMPLANT
SUT SILK 2 0 SH (SUTURE) ×3 IMPLANT
SUT SILK 3 0 (SUTURE) ×2
SUT SILK 3-0 18XBRD TIE 12 (SUTURE) ×1 IMPLANT
SUT VIC AB 2-0 CT1 27 (SUTURE) ×4
SUT VIC AB 2-0 CT1 TAPERPNT 27 (SUTURE) ×2 IMPLANT
SUT VIC AB 2-0 CTB1 (SUTURE) ×9 IMPLANT
SUT VIC AB 3-0 SH 27 (SUTURE) ×10
SUT VIC AB 3-0 SH 27X BRD (SUTURE) ×5 IMPLANT
SUT VIC AB 4-0 PS2 18 (SUTURE) ×3 IMPLANT
SUT VICRYL 4-0 PS2 18IN ABS (SUTURE) ×9 IMPLANT
SYR 30ML LL (SYRINGE) ×3 IMPLANT
SYR BULB IRRIGATION 50ML (SYRINGE) ×6 IMPLANT
SYRINGE 20CC LL (MISCELLANEOUS) ×3 IMPLANT
TAPE CLOTH SURG 4X10 WHT LF (GAUZE/BANDAGES/DRESSINGS) ×6 IMPLANT
TOWEL GREEN STERILE (TOWEL DISPOSABLE) ×3 IMPLANT
TRAY FOLEY MTR SLVR 16FR STAT (CATHETERS) ×3 IMPLANT
TUBING EXTENTION W/L.L. (IV SETS) IMPLANT
UNDERPAD 30X30 (UNDERPADS AND DIAPERS) ×3 IMPLANT
WATER STERILE IRR 1000ML POUR (IV SOLUTION) ×3 IMPLANT

## 2017-04-07 NOTE — Progress Notes (Signed)
Patient has home CPAP mask without tubing. Hospital tubing will not connect to patient mask. Patient states he does not like our mask and therefore does not want to wear CPAP for tonight. RT informed patient to have family bring in machine and tubing for use tomorrow night.

## 2017-04-07 NOTE — Anesthesia Procedure Notes (Signed)
Procedure Name: Intubation Date/Time: 04/07/2017 7:49 AM Performed by: Lance Coon, CRNA Pre-anesthesia Checklist: Patient identified, Emergency Drugs available, Suction available, Patient being monitored and Timeout performed Patient Re-evaluated:Patient Re-evaluated prior to induction Oxygen Delivery Method: Circle system utilized Preoxygenation: Pre-oxygenation with 100% oxygen Induction Type: IV induction Ventilation: Mask ventilation without difficulty and Oral airway inserted - appropriate to patient size Laryngoscope Size: Mac and 4 Grade View: Grade III Tube type: Oral Tube size: 7.5 mm Number of attempts: 1 Airway Equipment and Method: Stylet Placement Confirmation: ETT inserted through vocal cords under direct vision,  positive ETCO2 and breath sounds checked- equal and bilateral Secured at: 22 cm Tube secured with: Tape Dental Injury: Teeth and Oropharynx as per pre-operative assessment

## 2017-04-07 NOTE — Plan of Care (Signed)
  Problem: Pain Managment: Goal: General experience of comfort will improve Outcome: Progressing   

## 2017-04-07 NOTE — Op Note (Signed)
NAME: Paul Todd    MRN: [id number] DOB: [birth year]    DATE OF OPERATION: 04/07/2017  PREOP DIAGNOSIS:    Disabling claudication left lower extremity  POSTOP DIAGNOSIS:    Same  PROCEDURE:    1. Left common femoral artery to below-knee popliteal artery bypass with composite great saphenous vein (2 segments of great saphenous vein) 2.  Intraoperative arteriogram  SURGEON: Judeth Cornfield. Scot Dock, MD, FACS  ASSIST: Gerri Lins PA  ANESTHESIA: General  EBL: 100 cc  INDICATIONS:    Paul Todd is a 57 y.o. male who presents with disabling claudication.  He travels and is unable to get through the airport because of his symptoms.  He underwent an arteriogram was not a candidate for an endovascular approach.  He presents for infrainguinal bypass.  FINDINGS:   The great saphenous vein become unusable at the level of the knee.  There was a posterior accessory branch which I harvested and spliced this to the great saphenous vein so that I would have adequate length to use an all autogenous bypass.  The completion arteriogram showed no technical problems.  TECHNIQUE:   The patient was taken to the operating room and received a general anesthetic.  The left lower extremity was prepped and draped in the usual sterile fashion.  Given his obesity I elected to make an oblique incision in the left groin.  This was done above the inguinal crease.  The dissection was carried down to the common femoral artery which was quite deep.  I did dissected under the inguinal ligament to expose a soft spot on the artery.  The superficial femoral artery and deep femoral arteries were controlled.  Through the same incision the saphenofemoral junction was exposed.  Using 5 additional incisions along the medial aspect of the left leg the great saphenous vein was harvested to the proximal calf.  The vein became unusable at the level of the knee.  It branched multiple times.  Branches were all divided  between clips and 3-0 silk ties.  There was a posterior sensory branch which identified in the thigh and higher I harvested enough length of this to have adequate length to do a composite bypass.  Through the distal incision the below-knee popliteal artery was exposed.  The artery was quite far lateral making this technically challenging.  A tunnel was created from this incision to the groin incision and the patient was heparinized.  The saphenofemoral junction was clamped and the saphenous vein excised from the femoral vein.  The femoral vein was oversewn with 5-0 Prolene suture.  I elected to use the proximal segment of vein in a non-reversed fashion.  The vein was spatulated proximally.  The common femoral artery was clamped in the superficial and deep femoral arteries were controlled.  A longitudinal arteriotomy was made in the common femoral artery.  The vein was sewn end to side to the artery using continuous 5-0 Prolene suture.  A radiopaque marker had been placed around the proximal anastomosis.  I then used a retrograde Mills valvulotome to lyse the valves and established excellent flow through the graft.  This was then clamped and the other segment of vein which I harvested was reversed and spatulated.  The end of the none of her segment was spatulated and the tube was sewn end-to-end in a hand clasp fashion using 2 continuous 6-0 Prolene sutures.  The vein was then marked to prevent twisting.  It was then brought to the previously  created tunnel for anastomosis to the below-knee popliteal artery.  A tourniquet was placed on the thigh.  The leg was exsanguinated with an Esmarch bandage.  The tourniquet was inflated to 300 mmHg.  Under tourniquet control, a longitudinal arteriotomy was made in the below-knee popliteal artery.  The vein was spatulated and sewn end to side to the artery using continuous 6-0 Prolene suture.  Prior to completing this anastomosis the tourniquet was released.  The artery was  backbled and flushed appropriately and the anastomosis completed.  Flow was reestablished to the left foot.  There was a good posterior tibial signal at the completion.  I then shot an intraoperative arteriogram.  The proximal vein was cannulated and contrast injected with the vein clamp.  This showed no technical problems.  The heparin was partially reversed with protamine.  Hemostasis was obtained in the wounds.  The groin incision was closed over a 15 Blake drain.  This was done with 2 deep layers of 2-0 Vicryl in a subcutaneous layer of 3-0 Vicryl and the skin closed with 4-0 Vicryl.  42 Blake drain was also placed in the below the knee incision.  The remaining incisions were all closed with a deep layer of 2-0 Vicryl, subcutaneous layer with 3-0 Vicryl the skin closed with 4-0 Vicryl.  Sterile dressing was applied.  There was a brisk posterior tibial signal at the completion of the procedure.  All needle and sponge counts were correct.  Patient tolerated the procedure well and was transferred to the recovery room in stable condition.  [name], MD, FACS [facility]  DATE OF DICTATION:   04/07/2017

## 2017-04-07 NOTE — Transfer of Care (Signed)
Immediate Anesthesia Transfer of Care Note  Patient: Paul Todd  Procedure(s) Performed: LEFT FEMORAL-BELOW KNEE POPLITEAL ARTERY BYPASS USING COMPOSITE LEFT GREATER SAPHENOUS VEIN (Left Leg Upper) ANGIOGRAM EXTREMITY LEFT (Left Leg Upper)  Patient Location: PACU  Anesthesia Type:General  Level of Consciousness: awake and patient cooperative  Airway & Oxygen Therapy: Patient Spontanous Breathing and Patient connected to face mask oxygen  Post-op Assessment: Report given to RN and Post -op Vital signs reviewed and stable  Post vital signs: Reviewed and stable  Last Vitals:  Vitals Value Taken Time  BP 172/81 04/07/2017  1:23 PM  Temp    Pulse 101 04/07/2017  1:27 PM  Resp 19 04/07/2017  1:27 PM  SpO2 95 % 04/07/2017  1:27 PM  Vitals shown include unvalidated device data.  Last Pain:  Vitals:   04/07/17 1322  TempSrc:   PainSc: (P) Asleep      Patients Stated Pain Goal: 4 (43/88/87 5797)  Complications: No apparent anesthesia complications

## 2017-04-07 NOTE — Progress Notes (Signed)
Pt received from PACU. VSS. CHG done. Telemetry applied. Pulses on L ft with brisk doppler. Pt oriented to room. Call light in reach. Will continue to monitor.  Clyde Canterbury, RN

## 2017-04-07 NOTE — Anesthesia Preprocedure Evaluation (Signed)
Anesthesia Evaluation  Patient identified by MRN, date of birth, ID band Patient awake    Reviewed: Allergy & Precautions, NPO status , Patient's Chart, lab work & pertinent test results  Airway Mallampati: II  TM Distance: >3 FB Neck ROM: Full    Dental   Pulmonary sleep apnea , former smoker,    Pulmonary exam normal        Cardiovascular hypertension, Pt. on medications + CAD, + Past MI and + Cardiac Stents  Normal cardiovascular exam     Neuro/Psych    GI/Hepatic   Endo/Other    Renal/GU      Musculoskeletal   Abdominal   Peds  Hematology   Anesthesia Other Findings   Reproductive/Obstetrics                             Anesthesia Physical Anesthesia Plan  ASA: III  Anesthesia Plan: General   Post-op Pain Management:    Induction: Intravenous  PONV Risk Score and Plan: 2 and Ondansetron, Dexamethasone and Midazolam  Airway Management Planned: Oral ETT  Additional Equipment:   Intra-op Plan:   Post-operative Plan: Extubation in OR  Informed Consent: I have reviewed the patients History and Physical, chart, labs and discussed the procedure including the risks, benefits and alternatives for the proposed anesthesia with the patient or authorized representative who has indicated his/her understanding and acceptance.     Plan Discussed with: CRNA and Surgeon  Anesthesia Plan Comments:         Anesthesia Quick Evaluation

## 2017-04-07 NOTE — H&P (Signed)
REASON FOR ADMISSION:   For left fempop bypass  HPI:   Paul Todd is a pleasant 57 y.o. male who I seen with bilateral lower extremity claudication which he felt was disabling.  He underwent an arteriogram on 01/26/2017.   He continues to have disabling bilateral lower extremity claudication which is more significant on the left side.  He describes pain in his left calf which is brought on by ambulation and relieved with rest. He does have some back pain and also paresthesias in both feet.  He experiences some claudication in the right also.  He travels a lot and is unable to get around the airport because of his claudication symptoms.  He denies any significant rest pain or nonhealing ulcers.  He is not a smoker.  He does have a history of atrial fibrillation and is on Xarelto.  He had a myocardial infarction 4 years ago and quit smoking at that time.  He had PTCA.  He is on aspirin and is on a statin.      Past Medical History:  Diagnosis Date  . Arthritis       . CAD (coronary artery disease)    multivessel per cath 02/12/13, s/p stenting to RCA  . Chronic lower back pain   . Dyslipidemia   . Hx of Bell's palsy   . Hypertension   . Myocardial infarction ([facility]) 02/12/2013   . Nephrolithiasis   . OSA on CPAP   . PE (pulmonary thromboembolism) ([place]) 02/2014  . Persistent atrial fibrillation ([facility])              Family History  Problem Relation Age of Onset  . Heart disease Mother   . Diabetes Mother   . Stroke Mother   . Diabetes Father     SOCIAL HISTORY: Social History        Tobacco Use  . Smoking status: Former Smoker    Packs/day: 0.50    Years: 35.00    Pack years: 17.50    Types: Cigarettes    Last attempt to quit: 02/12/2013    Years since quitting: 4.0  . Smokeless tobacco: Never Used  Substance Use Topics  . Alcohol use: No    Comment: 07/21/2014 "never drank much; might have a couple drinks/yr since MI 02/2013"     No Known Allergies        Current Outpatient Medications  Medication Sig Dispense Refill  . acetaminophen (TYLENOL) 650 MG CR tablet Take 650 mg by mouth every 8 (eight) hours as needed for pain.    Marland Kitchen aspirin EC 81 MG tablet Take 81 mg by mouth daily.    Marland Kitchen atorvastatin (LIPITOR) 80 MG tablet TAKE 1 TABLET BY MOUTH EVERY DAY AT 6PM 90 tablet 1  . cyclobenzaprine (FLEXERIL) 10 MG tablet Take 10 mg by mouth 2 (two) times daily as needed for muscle spasms.  0  . docusate sodium (COLACE) 100 MG capsule Take 100 mg by mouth 2 (two) times daily.  5  . dofetilide (TIKOSYN) 250 MCG capsule Take 1 capsule (250 mcg total) 2 (two) times daily by mouth. 180 capsule 3  . DULoxetine (CYMBALTA) 60 MG capsule Take 60 mg by mouth every evening.    . magnesium oxide (MAG-OX) 400 (241.3 MG) MG tablet Take 1 tablet (400 mg total) by mouth daily. (Patient taking differently: Take 400 mg by mouth every evening. ) 30 tablet 11  . metoprolol succinate (TOPROL-XL) 50 MG 24 hr tablet TAKE 1  TABLET (50 MG TOTAL) BY MOUTH DAILY. TAKE WITH OR IMMEDIATELY FOLLOWING A MEAL. 90 tablet 0  . Multiple Vitamin (MULTIVITAMIN WITH MINERALS) TABS tablet Take 1 tablet by mouth daily.     . nitroGLYCERIN (NITROSTAT) 0.4 MG SL tablet PLACE 1 TABLET UNDER TONGUE EVERY 5 MINUTES AS NEEDED FOR CHEST PAIN MAX 3 TABLETS 25 tablet 3  . NON FORMULARY at bedtime. CPAP    . pantoprazole (PROTONIX) 40 MG tablet TAKE 1 TABLET BY MOUTH EVERY DAY 30 tablet 11  . potassium chloride (K-DUR,KLOR-CON) 10 MEQ tablet TAKE 1 TABLET BY MOUTH DAILY 30 tablet 0  . rivaroxaban (XARELTO) 20 MG TABS tablet Take 1 tablet (20 mg total) daily with supper by mouth. 90 tablet 3  . topiramate (TOPAMAX) 50 MG tablet Take 1 tablet (50 mg total) by mouth 2 (two) times daily. Start 1 tablet daily x 1 week then twice daily (Patient not taking: Reported on 01/15/2017) 60 tablet 2   No current facility-administered medications for this visit.      REVIEW OF SYSTEMS:  [X]  denotes positive finding, [ ]  denotes negative finding Cardiac  Comments:  Chest pain or chest pressure:    Shortness of breath upon exertion: x   Short of breath when lying flat:    Irregular heart rhythm:        Vascular    Pain in calf, thigh, or hip brought on by ambulation: x   Pain in feet at night that wakes you up from your sleep:     Blood clot in your veins:    Leg swelling:         Pulmonary    Oxygen at home:    Productive cough:     Wheezing:         Neurologic    Sudden weakness in arms or legs:     Sudden numbness in arms or legs:     Sudden onset of difficulty speaking or slurred speech:    Temporary loss of vision in one eye:     Problems with dizziness:         Gastrointestinal    Blood in stool:     Vomited blood:         Genitourinary    Burning when urinating:     Blood in urine:        Psychiatric    Major depression:         Hematologic    Bleeding problems:    Problems with blood clotting too easily:        Skin    Rashes or ulcers:        Constitutional    Fever or chills:     PHYSICAL EXAM:   Vitals:   04/07/17 0606  BP: (!) 143/63  Pulse: (!) 56  Resp: 20  Temp: 97.7 F (36.5 C)  SpO2: 99%   GENERAL: The patient is a well-nourished male, in no acute distress. The vital signs are documented above. CARDIAC: There is a regular rate and rhythm.  VASCULAR: I do not detect carotid bruits. He has palpable femoral pulses.  I cannot palpate popliteal or pedal pulses. PULMONARY: There is good air exchange bilaterally without wheezing or rales. ABDOMEN: Soft and non-tender with normal pitched bowel sounds.  MUSCULOSKELETAL: There are no major deformities or cyanosis. NEUROLOGIC: No focal weakness or paresthesias are detected. SKIN: There are no ulcers or rashes noted. PSYCHIATRIC: The patient has a normal  affect.  DATA:  AORTOGRAM: I have reviewed his aortogram that was done on 01/26/2017.   On the left side, which is the more symptomatic side hypogastric artery was occluded. The common and external iliac arteries were patent.  The common and deep femoral arteries were patent.  The superficial femoral artery is occluded at its origin with reconstitution of the popliteal artery at the level of the knee.  There was three-vessel runoff on the left..  On the right side the common iliac, external iliac and hypogastric arteries were patent.  The common femoral and deep femoral arteries were patent.  The superficial femoral artery was occluded at its origin with reconstitution of the above-knee popliteal artery and three-vessel runoff on the right.  LEFT GREAT SAPHENOUS VEIN MAP: I have reviewed his left great saphenous vein map that was done on 01/30/2017.   MEDICAL ISSUES:   BILATERAL LOWER EXTREMITY DISABLING CLAUDICATION: We have discussed conservative treatment including a structured walking program however he feels that his symptoms are preventing him from working.  He is unable to get around the airport and feels like he needs to quit his job at times.  This reason he would like to pursue revascularization.  Given the long segment occlusion of the SFA which goes down to the level of the knee I do not think is a good candidate for an endovascular approach.  I would recommend a left femoral to below-knee popliteal artery bypass.  The vein gets small below the knee and I have explained that if the vein is not adequate we could potentially have to do a prosthetic bypass.  I also explained that he is at increased risk because of his medical comorbidities and obesity.  His BMI is 41.  This certainly puts of increased risk for wound healing problems and infection.  He has not had any recent cardiac symptoms but given his history we will arrange for preoperative cardiac evaluation by [name].  Once  he is cleared we will schedule his surgery.  Because of his work schedule and he does not think he will be able to schedule this until early April. He did stop his Xarelto several days ago.   I have reviewed the indications for lower extremity bypass. I have also reviewed the potential complications of surgery including but not limited to: wound healing problems, infection, graft thrombosis, limb loss, or other unpredictable medical problems. All the patient's questions were answered and they are agreeable to proceed.   [name]

## 2017-04-08 ENCOUNTER — Inpatient Hospital Stay (HOSPITAL_COMMUNITY): Payer: BLUE CROSS/BLUE SHIELD

## 2017-04-08 ENCOUNTER — Encounter (HOSPITAL_COMMUNITY): Payer: Self-pay

## 2017-04-08 DIAGNOSIS — Z01810 Encounter for preprocedural cardiovascular examination: Secondary | ICD-10-CM

## 2017-04-08 LAB — BASIC METABOLIC PANEL
ANION GAP: 9 (ref 5–15)
BUN: 12 mg/dL (ref 6–20)
CHLORIDE: 103 mmol/L (ref 101–111)
CO2: 25 mmol/L (ref 22–32)
Calcium: 8.3 mg/dL — ABNORMAL LOW (ref 8.9–10.3)
Creatinine, Ser: 0.96 mg/dL (ref 0.61–1.24)
GFR calc Af Amer: >60 mL/min (ref >60)
GFR calc non Af Amer: >60 mL/min (ref >60)
GLUCOSE: 186 mg/dL — AB (ref 65–99)
POTASSIUM: 4.5 mmol/L (ref 3.5–5.1)
Sodium: 137 mmol/L (ref 135–145)

## 2017-04-08 LAB — CBC
HEMATOCRIT: 41 % (ref 39.0–52.0)
Hemoglobin: 13.2 g/dL (ref 13.0–17.0)
MCH: 30.2 pg (ref 26.0–34.0)
MCHC: 32.2 g/dL (ref 30.0–36.0)
MCV: 93.8 fL (ref 78.0–100.0)
Platelets: 221 10*3/uL (ref 150–400)
RBC: 4.37 MIL/uL (ref 4.22–5.81)
RDW: 13.6 % (ref 11.5–15.5)
WBC: 13.8 10*3/uL — ABNORMAL HIGH (ref 4.0–10.5)

## 2017-04-08 MED ORDER — RIVAROXABAN 20 MG PO TABS
20.0000 mg | ORAL_TABLET | Freq: Every day | ORAL | Status: DC
  Administered 2017-04-08 – 2017-04-09 (×2): 20 mg via ORAL
  Filled 2017-04-08 (×2): qty 1

## 2017-04-08 NOTE — Progress Notes (Signed)
Patient in chair in room. Patient informed of order received to pull both JP drains to left lower extremity. Procedure explained to patient. Patient assisted back to bed. Patient in recumbent position. Left proximal JP drain removed with minimal blood noted. Dressing applied. Left distal JP drain removed with minimal blood noted. Patient tolerated well.

## 2017-04-08 NOTE — Progress Notes (Signed)
   VASCULAR SURGERY ASSESSMENT & PLAN:   1 Day Post-Op s/p: Left common femoral artery to below-knee popliteal artery bypass with composite vein graft (2 segments left great saphenous vein)  Brisk Doppler signals in the left foot.  JP 125 cc last shift, JP to 5 cc last shift.  Discontinue JP's  Ambulate.  I suspect he will get significant left leg swelling and will encourage him to elevate his legs properly when he is in bed.  He may resume his Xarelto today.  Anticipate discharge tomorrow.  SUBJECTIVE:   No specific complaints.  PHYSICAL EXAM:   Vitals:   04/07/17 1929 04/07/17 2320 04/08/17 0430 04/08/17 0700  BP: 131/79 (!) 142/78 (!) 141/79   Pulse: (!) 102 91 76 68  Resp: (!) 25 18 (!) 22 14  Temp: 98.1 F (36.7 C) 97.9 F (36.6 C) 97.7 F (36.5 C)   TempSrc: Oral Oral Oral   SpO2: 95% 95% 99% 93%  Weight: 264 lb 8.8 oz (120 kg)     Height: 5\' 7"  (1.702 m)      Incisions look fine. Brisk DP and posterior tibial signal with the Doppler.  LABS:   Lab Results  Component Value Date   WBC 13.8 (H) 04/08/2017   HGB 13.2 04/08/2017   HCT 41.0 04/08/2017   MCV 93.8 04/08/2017   PLT 221 04/08/2017   Lab Results  Component Value Date   CREATININE 0.96 04/08/2017   Lab Results  Component Value Date   INR 1.00 04/07/2017   PROBLEM LIST:    Active Problems:   PAD (peripheral artery disease) ([facility])  CURRENT MEDS:   . aspirin EC  81 mg Oral Daily  . atorvastatin  20 mg Oral q1800  . docusate sodium  100 mg Oral BID  . dofetilide  250 mcg Oral BID  . DULoxetine  60 mg Oral QPM  . enoxaparin (LOVENOX) injection  40 mg Subcutaneous Q24H  . magnesium oxide  400 mg Oral QPM  . mouth rinse  15 mL Mouth Rinse BID  . metoprolol succinate  50 mg Oral Daily  . multivitamin with minerals  1 tablet Oral Daily  . pantoprazole  40 mg Oral Daily  . potassium chloride  10 mEq Oral Daily   [name] Beeper: [phone] Office: [phone] 04/08/2017

## 2017-04-08 NOTE — Evaluation (Signed)
Physical Therapy Evaluation Patient Details Name: MCKAY TEGTMEYER MRN: [id number] DOB: [birth year] Today's Date: 04/08/2017   History of Present Illness  Pt is a 57 y.o. male now s/p left common femoral to below-knee popliteal artery bypass graft on 04/07/17. PMH includes PAD, a-fib, OSA on CPAP, MI, chronic LBP.    Clinical Impression  Patient evaluated by Physical Therapy with no further acute PT needs identified. PTA, pt indep and lives with family available for PRN assist. Today, pt mod indep with RW; supervision for amb with no DME due to slight instability. All education has been completed and the patient has no further questions. Encouraged to continue ambulation during hospital admission. PT is signing off. Thank you for this referral.    Follow Up Recommendations No PT follow up;Supervision - Intermittent    Equipment Recommendations  Rolling walker with 5" wheels    Recommendations for Other Services       Precautions / Restrictions Precautions Precautions: Fall Restrictions Weight Bearing Restrictions: No      Mobility  Bed Mobility               General bed mobility comments: Received sitting in recliner  Transfers Overall transfer level: Modified independent Equipment used: Rolling walker (2 wheeled);None Transfers: Sit to/from Stand           General transfer comment: Indep with and without RW  Ambulation/Gait Ambulation/Gait assistance: Supervision;Modified independent (Device/Increase time) Ambulation Distance (Feet): 200 Feet Assistive device: Rolling walker (2 wheeled);None Gait Pattern/deviations: Step-through pattern;Decreased stride length Gait velocity: Decreased Gait velocity interpretation: Below normal speed for age/gender General Gait Details: Pt mod indep with RW. Supervision with no DME due to slight instability. Cues for energy conservation due to BLE fatigue  Stairs            Wheelchair Mobility    Modified Rankin (Stroke  Patients Only)       Balance Overall balance assessment: Needs assistance   Sitting balance-Leahy Scale: Good       Standing balance-Leahy Scale: Good                               Pertinent Vitals/Pain Pain Assessment: Faces Faces Pain Scale: Hurts little more Pain Location: LLE incisions Pain Descriptors / Indicators: Sore Pain Intervention(s): Monitored during session    Home Living Family/patient expects to be discharged to:: Private residence Living Arrangements: Spouse/significant other;Children Available Help at Discharge: Family;Available PRN/intermittently Type of Home: House Home Access: Stairs to enter Entrance Stairs-Rails: Right(post) Entrance Stairs-Number of Steps: 1 Home Layout: One level Home Equipment: Cane - quad;Shower seat      Prior Function Level of Independence: Independent               Hand Dominance        Extremity/Trunk Assessment   Upper Extremity Assessment Upper Extremity Assessment: Overall WFL for tasks assessed    Lower Extremity Assessment Lower Extremity Assessment: LLE deficits/detail;Overall WFL for tasks assessed LLE Deficits / Details: s/p bypass graft       Communication   Communication: No difficulties  Cognition Arousal/Alertness: Awake/alert Behavior During Therapy: WFL for tasks assessed/performed Overall Cognitive Status: Within Functional Limits for tasks assessed                                        General Comments General  comments (skin integrity, edema, etc.): Wife present during session    Exercises     Assessment/Plan    PT Assessment Patent does not need any further PT services  PT Problem List         PT Treatment Interventions      PT Goals (Current goals can be found in the Care Plan section)  Acute Rehab PT Goals PT Goal Formulation: All assessment and education complete, DC therapy    Frequency     Barriers to discharge         Co-evaluation               AM-PAC PT "6 Clicks" Daily Activity  Outcome Measure Difficulty turning over in bed (including adjusting bedclothes, sheets and blankets)?: None Difficulty moving from lying on back to sitting on the side of the bed? : None Difficulty sitting down on and standing up from a chair with arms (e.g., wheelchair, bedside commode, etc,.)?: None Help needed moving to and from a bed to chair (including a wheelchair)?: None Help needed walking in hospital room?: A Little Help needed climbing 3-5 steps with a railing? : A Little 6 Click Score: 22    End of Session Equipment Utilized During Treatment: Gait belt Activity Tolerance: Patient tolerated treatment well Patient left: in chair;with family/visitor present;with call bell/phone within reach Nurse Communication: Mobility status PT Visit Diagnosis: Other abnormalities of gait and mobility (R26.89)    Time: 9798-9211 PT Time Calculation (min) (ACUTE ONLY): 16 min   Charges:   PT Evaluation $PT Eval Moderate Complexity: 1 Mod     PT G Codes:       Mabeline Caras, PT, DPT Acute Rehab Services  Pager: [place] 04/08/2017, 11:08 AM

## 2017-04-08 NOTE — Evaluation (Signed)
Occupational Therapy Evaluation and Discharge Patient Details Name: Paul Todd MRN: [id number] DOB: [birth year] Today's Date: 04/08/2017    History of Present Illness Pt is a 57 y.o. male now s/p left common femoral to below-knee popliteal artery bypass graft on 04/07/17. PMH includes PAD, a-fib, OSA on CPAP, MI, chronic LBP.   Clinical Impression   Pt reports he was independent with ADL PTA. Currently pt overall mod I with ADL and functional mobility but does require min assist for LB dressing and supervision for tub transfers. Educated pt on fall prevention and compensatory strategies; pt verbalized understanding. Pt planning to d/c home with near 24/7 supervision from family. No further acute OT needs identified; signing off at this time. Please re-consult if needs change. Thank you for this referral.    Follow Up Recommendations  No OT follow up;Supervision - Intermittent    Equipment Recommendations  None recommended by OT    Recommendations for Other Services       Precautions / Restrictions Precautions Precautions: Fall Restrictions Weight Bearing Restrictions: No      Mobility Bed Mobility               General bed mobility comments: Pt OOB in chair upon arrival  Transfers Overall transfer level: Modified independent Equipment used: None             General transfer comment: Increased time    Balance Overall balance assessment: Mild deficits observed, not formally tested                                         ADL either performed or assessed with clinical judgement   ADL Overall ADL's : Needs assistance/impaired Eating/Feeding: Independent;Sitting   Grooming: Modified independent;Standing   Upper Body Bathing: Modified independent;Sitting   Lower Body Bathing: Supervison/ safety;Sit to/from stand   Upper Body Dressing : Modified independent;Sitting   Lower Body Dressing: Minimal assistance;Sit to/from stand Lower Body  Dressing Details (indicate cue type and reason): Pt with difficulty reaching L foot; reports wife can assist with LB dressing as needed. Educated on compensatory strategies for LB ADL. Toilet Transfer: Modified Independent;Ambulation Toilet Transfer Details (indicate cue type and reason): Simulated by sit to stand from chair with functional mobility     Tub/ Shower Transfer: Supervision/safety;Tub transfer;Ambulation;Shower Scientist, research (medical) Details (indicate cue type and reason): Simulated in room with supervision for safety. Recommend supervision initially with tub transfer upon return home; pt is agreeable. Also recommend use of shower chair initially; pt verbalized understanding Functional mobility during ADLs: Modified independent General ADL Comments: Educated pt on gradually increasing activity to pain tolerance. 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      Pertinent Vitals/Pain Pain Assessment: Faces Faces Pain Scale: Hurts little more Pain Location: LLE Pain Descriptors / Indicators: Discomfort;Sore Pain Intervention(s): Monitored during session     Hand Dominance     Extremity/Trunk Assessment Upper Extremity Assessment Upper Extremity Assessment: Overall WFL for tasks assessed   Lower Extremity Assessment Lower Extremity Assessment: Defer to PT evaluation       Communication Communication Communication: No difficulties   Cognition Arousal/Alertness: Awake/alert Behavior During Therapy: WFL for tasks assessed/performed Overall Cognitive Status: Within Functional Limits for tasks assessed  General Comments       Exercises     Shoulder Instructions      Home Living Family/patient expects to be discharged to:: Private residence Living Arrangements: Spouse/significant other;Children Available Help at Discharge: Family;Available 24 hours/day Type of Home: House Home  Access: Stairs to enter [employer] of Steps: 1 Entrance Stairs-Rails: Right Home Layout: One level     Bathroom Shower/Tub: Teacher, early years/pre: Standard     Home Equipment: Cane - quad;Shower seat;Bedside commode          Prior Functioning/Environment Level of Independence: Independent        Comments: Works in [employer]; travels frequently, active at work        OT Problem List:        OT Treatment/Interventions:      OT Goals(Current goals can be found in the care plan section) Acute Rehab OT Goals Patient Stated Goal: get better and back to work OT Goal Formulation: All assessment and education complete, DC therapy  OT Frequency:     Barriers to D/C:            Co-evaluation              AM-PAC PT "6 Clicks" Daily Activity     Outcome Measure Help from another person eating meals?: None Help from another person taking care of personal grooming?: None Help from another person toileting, which includes using toliet, bedpan, or urinal?: None Help from another person bathing (including washing, rinsing, drying)?: A Little Help from another person to put on and taking off regular upper body clothing?: None Help from another person to put on and taking off regular lower body clothing?: A Little 6 Click Score: 22   End of Session    Activity Tolerance: Patient tolerated treatment well Patient left: in chair;with call bell/phone within reach  OT Visit Diagnosis: Other abnormalities of gait and mobility (R26.89)                Time: 3212-2482 OT Time Calculation (min): 15 min Charges:  OT General Charges $OT Visit: 1 Visit OT Evaluation $OT Eval Low Complexity: 1 Low G-Codes:     Gladine Plude A. Ulice Brilliant, M.S., OTR/L Pager: [place] 04/08/2017, 3:20 PM

## 2017-04-08 NOTE — Progress Notes (Addendum)
VASCULAR LAB PRELIMINARY  ARTERIAL  ABI completed:    RIGHT    LEFT    PRESSURE WAVEFORM  PRESSURE WAVEFORM  BRACHIAL 129 triphasic BRACHIAL 128 triphasic  DP 106 triphasic DP 110 triphasic  AT   AT    PT 122 biphasic PT 129 triphasic  PER   PER    GREAT TOE  NA GREAT TOE  NA    RIGHT LEFT  ABI 0.95 1.00   ABIs are within normal limits bilaterally at rest.   Hongying Heath Tesler (RDMS RVT) 04/08/17 9:10 AM

## 2017-04-08 NOTE — Progress Notes (Addendum)
Gauze dressing to left proximal knee changed x 2 post JP drain removal. Dressings were saturated with serosanguinous drainage. Dressing currently dry and intact with no further drainage after ambulation in hallway. Will continue to monitor.

## 2017-04-09 MED ORDER — OXYCODONE HCL 5 MG PO TABS: 5.0000 mg | ORAL_TABLET | ORAL | 0 refills | Status: DC | PRN

## 2017-04-09 NOTE — Progress Notes (Addendum)
[facility]  Subjective  - Pain issues over night.     Objective 133/79 68 (!) 97.1 F (36.2 C) (Oral) 19 96%  Intake/Output Summary (Last 24 hours) at 04/09/2017 0723 Last data filed at 04/08/2017 2315 Gross per 24 hour  Intake 560 ml  Output -  Net 560 ml    Left groin wound healing well 4x4 place in groin crease Leg incisions healing, no drainage s/p JP removal Doppler signals PT/DP/peroneal biphasic Heart RRR Lungs non labored breathing   Assessment/Planning: POD # 2 Left common femoral artery to below-knee popliteal artery bypass with composite vein graft (2 segments left great saphenous vein)  Pain issues over night requiring IV Morphine.  Better this am. Ambulating with rolling walker.  Will order walker for home use.  No PT follow up needed. As long as pain issues are better we plan on discharge home later today.    Roxy Horseman 04/09/2017 7:23 AM --  Laboratory Lab Results: Recent Labs    04/07/17 1641 04/08/17 0018  WBC 14.0* 13.8*  HGB 13.4 13.2  HCT 41.6 41.0  PLT 215 221   BMET Recent Labs    04/07/17 1641 04/08/17 0018  NA  --  137  K  --  4.5  CL  --  103  CO2  --  25  GLUCOSE  --  186*  BUN  --  12  CREATININE 0.87 0.96  CALCIUM  --  8.3*    COAG Lab Results  Component Value Date   INR 1.00 04/07/2017   INR 1.27 07/03/2014   INR 1.21 09/05/2013   No results found for: PTT  I have interviewed the patient and examined the patient. I agree with the findings by the PA.  Gae Gallop, MD [phone]

## 2017-04-09 NOTE — Anesthesia Postprocedure Evaluation (Signed)
Anesthesia Post Note  Patient: Paul Todd  Procedure(s) Performed: LEFT FEMORAL-BELOW KNEE POPLITEAL ARTERY BYPASS USING COMPOSITE LEFT GREATER SAPHENOUS VEIN (Left Leg Upper) ANGIOGRAM EXTREMITY LEFT (Left Leg Upper)     Patient location during evaluation: PACU Anesthesia Type: General Level of consciousness: awake and alert Pain management: pain level controlled Vital Signs Assessment: post-procedure vital signs reviewed and stable Respiratory status: spontaneous breathing, nonlabored ventilation, respiratory function stable and patient connected to nasal cannula oxygen Cardiovascular status: blood pressure returned to baseline and stable Postop Assessment: no apparent nausea or vomiting Anesthetic complications: no    Last Vitals:  Vitals:   04/08/17 2315 04/09/17 0330  BP: (!) 152/89 133/79  Pulse: 68   Resp: 17 19  Temp: (!) 36.4 C (!) 36.2 C  SpO2: 96%     Last Pain:  Vitals:   04/09/17 0434  TempSrc:   PainSc: Asleep                 Makyah Lavigne COKER

## 2017-04-09 NOTE — Care Management Note (Signed)
Case Management Note Marvetta Gibbons RN, BSN Unit 4E-Case Manager [phone]  Patient Details  Name: Paul Todd MRN: [id number] Date of Birth: 10/14/1960  Subjective/Objective: Pt admitted s/p  Left common femoral artery to below-knee popliteal artery bypass                   Action/Plan: PTA pt lived at home with spouse, per PT eval no follow up recommended- order placed for RW for home, have notified Butch Penny with [facility] for DME need- RW to be delivered to room prior to discharge.   Expected Discharge Date:  04/09/17               Expected Discharge Plan:  Home/Self Care  In-House Referral:  NA  Discharge planning Services  CM Consult  Post Acute Care Choice:  Durable Medical Equipment Choice offered to:  Patient  DME Arranged:  [place]ford Rile rolling DME Agency:  [place]:    [place]:     Status of Service:  Completed, signed off  If discussed at [place] of Stay Meetings, dates discussed:    Discharge Disposition: home/self care   Additional Comments:  Dawayne Patricia, RN 04/09/2017, 11:54 AM

## 2017-04-09 NOTE — Discharge Instructions (Signed)
 [facility] [place]  Discharge instructions  Lower Extremity Bypass Surgery  Please refer to the following instruction for your post-procedure care. Your surgeon or physician assistant will discuss any changes with you.  Activity  You are encouraged to walk as much as you can. You can slowly return to normal activities during the month after your surgery. Avoid strenuous activity and heavy lifting until your doctor tells you it's OK. Avoid activities such as vacuuming or swinging a golf club. Do not drive until your doctor give the OK and you are no longer taking prescription pain medications. It is also normal to have difficulty with sleep habits, eating and bowel movement after surgery. These will go away with time.  Bathing/Showering  You may shower after you go home. Do not soak in a bathtub, hot tub, or swim until the incision heals completely.  Incision Care  Clean your incision with mild soap and water. Shower every day. Pat the area dry with a clean towel. You do not need a bandage unless otherwise instructed. Do not apply any ointments or creams to your incision. If you have open wounds you will be instructed how to care for them or a visiting nurse may be arranged for you. If you have staples or sutures along your incision they will be removed at your post-op appointment. You may have skin glue on your incision. Do not peel it off. It will come off on its own in about one week. If you have a great deal of moisture in your groin, use a gauze help keep this area dry.  Diet  Resume your normal diet. There are no special food restrictions following this procedure. A low fat/ low cholesterol diet is recommended for all patients with vascular disease. In order to heal from your surgery, it is CRITICAL to get adequate nutrition. Your body requires vitamins, minerals, and protein. Vegetables are the best source of vitamins and minerals. Vegetables also provide the  perfect balance of protein. Processed food has little nutritional value, so try to avoid this.  Medications  Resume taking all your medications unless your doctor or nurse practitioner tells you not to. If your incision is causing pain, you may take over-the-counter pain relievers such as acetaminophen (Tylenol). If you were prescribed a stronger pain medication, please aware these medication can cause nausea and constipation. Prevent nausea by taking the medication with a snack or meal. Avoid constipation by drinking plenty of fluids and eating foods with high amount of fiber, such as fruits, vegetables, and grains. Take Colase 100 mg (an over-the-counter stool softener) twice a day as needed for constipation. Do not take Tylenol if you are taking prescription pain medications.  Follow Up  Our office will schedule a follow up appointment 2-3 weeks following discharge.  Please call us immediately for any of the following conditions  Severe or worsening pain in your legs or feet while at rest or while walking Increase pain, redness, warmth, or drainage (pus) from your incision site(s) Fever of 101 degree or higher The swelling in your leg with the bypass suddenly worsens and becomes more painful than when you were in the hospital If you have been instructed to feel your graft pulse then you should do so every day. If you can no longer feel this pulse, call the office immediately. Not all patients are given this instruction.  Leg swelling is common after leg bypass surgery.  The swelling should improve over a few months   following surgery. To improve the swelling, you may elevate your legs above the level of your heart while you are sitting or resting. Your surgeon or physician assistant may ask you to apply an ACE wrap or wear compression (TED) stockings to help to reduce swelling.  Reduce your risk of vascular disease  Stop smoking. If you would like help call [facility] at 1-800-QUIT-NOW  (1-800-784-8669) or [place] at 336-586-4000.  Manage your cholesterol Maintain a desired weight Control your diabetes weight Control your diabetes Keep your blood pressure down  If you have any questions, please call the office at [phone]   

## 2017-04-10 ENCOUNTER — Telehealth: Payer: Self-pay | Admitting: Vascular Surgery

## 2017-04-10 NOTE — Telephone Encounter (Signed)
-----   Message from Mena Goes, RN sent at 04/09/2017 11:02 AM EDT ----- Regarding: 2 weeks for postop fem-pop    ----- Message ----- From: Ulyses Amor, PA-C Sent: 04/09/2017   7:37 AM To: Vvs Charge Pool  S/P left fem-pop by pass f/u in 2weeks with Dr. Scot Dock.

## 2017-04-10 NOTE — Telephone Encounter (Signed)
Spoke to pt for appt on 4/17 [facility]

## 2017-04-10 NOTE — Progress Notes (Signed)
Pt discharged home with wife. IV and telemetry box removed. Pt and pt's wife received discharge instructions and all questions were answered. Pt received paper prescription for oxycodone and was instructed to get it filled at his pharmacy. Pt verbalized understanding. Pt left with all of his belongings. Pt discharged via wheelchair and was accompanied by a Wilfrid Lund.   Ara Kussmaul BSN, RN

## 2017-04-10 NOTE — Progress Notes (Signed)
   VASCULAR SURGERY ASSESSMENT & PLAN:   3 Days Post-Op s/p: Left femoral to below-knee popliteal artery bypass with a composite vein graft using 2 segments of left great saphenous vein.  The graft is patent with biphasic signals in the dorsalis pedis and posterior tibial positions.  Discharge today.  I have stressed with him the importance of proper leg elevation.  He already has a follow-up appointment scheduled.   SUBJECTIVE:   Pain well controlled.  PHYSICAL EXAM:   Vitals:   04/09/17 2032 04/10/17 0034 04/10/17 0350 04/10/17 0556  BP:  133/74 (!) 152/72 (!) 147/86  Pulse: 84 86 75 93  Resp: (!) 30 (!) 24 (!) 22 (!) 29  Temp: 98.4 F (36.9 C) 98.5 F (36.9 C) 98.6 F (37 C) 98 F (36.7 C)  TempSrc: Oral Oral Oral Oral  SpO2:  97%  90%  Weight:      Height:       Biphasic dorsalis pedis and posterior tibial signal on the left. His incisions look fine.  LABS:   Lab Results  Component Value Date   WBC 13.8 (H) 04/08/2017   HGB 13.2 04/08/2017   HCT 41.0 04/08/2017   MCV 93.8 04/08/2017   PLT 221 04/08/2017   Lab Results  Component Value Date   CREATININE 0.96 04/08/2017   Lab Results  Component Value Date   INR 1.00 04/07/2017    PROBLEM LIST:    Active Problems:   PAD (peripheral artery disease) (HCC)   CURRENT MEDS:   . aspirin EC  81 mg Oral Daily  . atorvastatin  20 mg Oral q1800  . docusate sodium  100 mg Oral BID  . dofetilide  250 mcg Oral BID  . DULoxetine  60 mg Oral QPM  . magnesium oxide  400 mg Oral QPM  . mouth rinse  15 mL Mouth Rinse BID  . metoprolol succinate  50 mg Oral Daily  . multivitamin with minerals  1 tablet Oral Daily  . pantoprazole  40 mg Oral Daily  . potassium chloride  10 mEq Oral Daily  . rivaroxaban  20 mg Oral Q supper    Paul Todd Beeper: [phone] Office: [phone] 04/10/2017

## 2017-04-13 ENCOUNTER — Ambulatory Visit: Payer: Self-pay | Admitting: Internal Medicine

## 2017-04-13 NOTE — Discharge Summary (Signed)
[facility] Discharge Summary   Patient ID:  Paul Todd MRN: [id number] DOB/AGE: 58-Sep-1962 58 y.o.  Admit date: 04/07/2017 Discharge date: 04/10/2017 Date of Surgery: 04/07/2017 Surgeon: Surgeon(s): Angelia Mould, MD  Admission Diagnosis: claudication  Discharge Diagnoses:  claudication  Secondary Diagnoses: Past Medical History:  Diagnosis Date  . Arthritis       . CAD (coronary artery disease)    multivessel per cath 02/12/13, s/p stenting to RCA  . Chronic lower back pain   . Dyslipidemia   . Hx of Bell's palsy   . Hypertension   . Myocardial infarction ([place]) 02/12/2013   . Nephrolithiasis   . OSA on CPAP   . PE (pulmonary thromboembolism) ([place]) 02/2014  . Persistent atrial fibrillation (HCC)         Procedure(s): LEFT FEMORAL-BELOW KNEE POPLITEAL ARTERY BYPASS USING COMPOSITE LEFT GREATER SAPHENOUS VEIN ANGIOGRAM EXTREMITY LEFT  Discharged Condition: good  HPI: 57 y/o male with disabling claudication left LE > right LE.    AORTOGRAM: I have reviewed his aortogram that was done on 01/26/2017.  On the left side, which is the more symptomatic side hypogastric artery was occluded. The common and external iliac arteries were patent. The common and deep femoral arteries were patent. The superficial femoral artery is occluded at its origin with reconstitution of the popliteal artery at the level of the knee. There was three-vessel runoff on the left..  Dr. Scot Dock has recommended a left femoral to below-knee popliteal artery bypass.      Hospital Course:  [name] is a 57 y.o. male is S/P  Procedure(s): LEFT FEMORAL-BELOW KNEE POPLITEAL ARTERY BYPASS USING COMPOSITE LEFT GREATER SAPHENOUS VEIN ANGIOGRAM EXTREMITY LEFT  Post op he has Brisk doppler signals in the left foot.  JP drain in place with 125 cc output since surgery, last shift decreased significantly to 5 cc.  D/C discontinued 04/08/2017.  His Xarelto was resumed post  day # 1.    POD#2 he has a chief complaint of left LE pain and required Morphine.  He was kept for pain control and mobility for one more night.    POD# 3 He was stable with good PO pain control.  A rolling walker was ordered for home use.  No PT f/u was advised. He was discharged home in stable condition.   Significant Diagnostic Studies: CBC Lab Results  Component Value Date   WBC 13.8 (H) 04/08/2017   HGB 13.2 04/08/2017   HCT 41.0 04/08/2017   MCV 93.8 04/08/2017   PLT 221 04/08/2017    BMET    Component Value Date/Time   NA 137 04/08/2017 0018   K 4.5 04/08/2017 0018   CL 103 04/08/2017 0018   CO2 25 04/08/2017 0018   GLUCOSE 186 (H) 04/08/2017 0018   BUN 12 04/08/2017 0018   CREATININE 0.96 04/08/2017 0018   CREATININE 0.87 07/03/2014 0845   CALCIUM 8.3 (L) 04/08/2017 0018   GFRNONAA >60 04/08/2017 0018   GFRAA >60 04/08/2017 0018   COAG Lab Results  Component Value Date   INR 1.00 04/07/2017   INR 1.27 07/03/2014   INR 1.21 09/05/2013     Disposition:  Discharge to :Home Discharge Instructions    Call MD for:  redness, tenderness, or signs of infection (pain, swelling, bleeding, redness, odor or green/yellow discharge around incision site)   Complete by:  As directed    Call MD for:  redness, tenderness, or signs of infection (pain, swelling, bleeding,  redness, odor or green/yellow discharge around incision site)   Complete by:  As directed    Call MD for:  severe or increased pain, loss or decreased feeling  in affected limb(s)   Complete by:  As directed    Call MD for:  severe or increased pain, loss or decreased feeling  in affected limb(s)   Complete by:  As directed    Call MD for:  temperature >100.5   Complete by:  As directed    Call MD for:  temperature >100.5   Complete by:  As directed    Resume previous diet   Complete by:  As directed    Resume previous diet   Complete by:  As directed      Allergies as of 04/10/2017   No Known  Allergies     Medication List    TAKE these medications   acetaminophen 650 MG CR tablet Commonly known as:  TYLENOL Take 650 mg by mouth every 8 (eight) hours as needed for pain.   aspirin EC 81 MG tablet Take 81 mg by mouth daily.   atorvastatin 80 MG tablet Commonly known as:  LIPITOR TAKE 1 TABLET BY MOUTH EVERY DAY AT 6PM   docusate sodium 100 MG capsule Commonly known as:  COLACE Take 100 mg by mouth 2 (two) times daily.   dofetilide 250 MCG capsule Commonly known as:  TIKOSYN Take 1 capsule (250 mcg total) 2 (two) times daily by mouth.   DULoxetine 60 MG capsule Commonly known as:  CYMBALTA Take 60 mg by mouth every evening.   magnesium oxide 400 (241.3 Mg) MG tablet Commonly known as:  MAG-OX Take 1 tablet (400 mg total) by mouth daily. What changed:  when to take this   metoprolol succinate 50 MG 24 hr tablet Commonly known as:  TOPROL-XL TAKE 1 TABLET (50 MG TOTAL) BY MOUTH DAILY. TAKE WITH OR IMMEDIATELY FOLLOWING A MEAL.   multivitamin with minerals Tabs tablet Take 1 tablet by mouth daily.   nitroGLYCERIN 0.4 MG SL tablet Commonly known as:  NITROSTAT PLACE 1 TABLET UNDER TONGUE EVERY 5 MINUTES AS NEEDED FOR CHEST PAIN MAX 3 TABLETS   NON FORMULARY at bedtime. CPAP   oxyCODONE 5 MG immediate release tablet Commonly known as:  Oxy IR/ROXICODONE Take 1 tablet (5 mg total) by mouth every 4 (four) hours as needed for moderate pain.   pantoprazole 40 MG tablet Commonly known as:  PROTONIX TAKE 1 TABLET BY MOUTH EVERY DAY   potassium chloride 10 MEQ tablet Commonly known as:  K-DUR,KLOR-CON TAKE 1 TABLET BY MOUTH DAILY   rivaroxaban 20 MG Tabs tablet Commonly known as:  XARELTO Take 1 tablet (20 mg total) daily with supper by mouth.      Verbal and written Discharge instructions given to the patient. Wound care per Discharge AVS Follow-up Information    Angelia Mould, MD Follow up in 2 week(s).   Specialties:  Vascular Surgery,  Cardiology Why:  office will call appt. Contact information: [address] [place] [address] [phone]           Signed: Roxy Horseman 04/13/2017, 11:12 AM - For VQI Registry use --- Instructions: Press F2 to tab through selections.  Delete question if not applicable.   Post-op:  Wound infection: No  Graft infection: No  Transfusion: No  If yes, 0 units given New Arrhythmia: No Ipsilateral amputation: [ ]  no, [ ]  Minor, [ ]  BKA, [ ]  AKA Discharge  patency: [ ]  Primary, [ ]  Primary assisted, [ ]  Secondary, [ ]  Occluded Patency judged by: [ ]  Dopper only, [ ]  Palpable graft pulse, [ ]  Palpable distal pulse, [ ]  ABI inc. > 0.15, [ ]  Duplex Discharge ABI: R 0.95, L 1.0 D/C Ambulatory Status: Ambulatory with Assistance  Complications: MI: [x ] No, [ ]  Troponin only, [ ]  EKG or Clinical CHF: No Resp failure: [x ] none, [ ]  Pneumonia, [ ]  Ventilator Chg in renal function: [x ] none, [ ]  Inc. Cr > 0.5, [ ]  Temp. Dialysis, [ ]  Permanent dialysis Stroke: [ ]  None, [ ]  Minor, [ ]  Major Return to OR: No  Reason for return to OR: [ ]  Bleeding, [ ]  Infection, [ ]  Thrombosis, [ ]  Revision  Discharge medications: Statin use:  Yes ASA use:  Yes Plavix use:  No  for medical reason   Beta blocker use: Yes Coumadin use: No  for medical reason Xarelto

## 2017-04-21 ENCOUNTER — Ambulatory Visit: Payer: Self-pay | Admitting: Physician Assistant

## 2017-04-22 ENCOUNTER — Other Ambulatory Visit: Payer: Self-pay

## 2017-04-22 ENCOUNTER — Ambulatory Visit (INDEPENDENT_AMBULATORY_CARE_PROVIDER_SITE_OTHER): Payer: Self-pay | Admitting: Vascular Surgery

## 2017-04-22 ENCOUNTER — Encounter: Payer: Self-pay | Admitting: Vascular Surgery

## 2017-04-22 VITALS — BP 130/80 | HR 67 | Temp 97.7°F | Resp 20 | Ht 67.0 in | Wt 265.0 lb

## 2017-04-22 DIAGNOSIS — Z48812 Encounter for surgical aftercare following surgery on the circulatory system: Secondary | ICD-10-CM

## 2017-04-22 NOTE — Progress Notes (Signed)
Patient name: Paul Todd MRN: [id number] DOB: [birth year] Sex: male  REASON FOR VISIT:   Follow-up  HPI:   Paul Todd is a pleasant 57 y.o. male who presented with disabling left lower extremity claudication.  He travels a lot and was unable to get to the airport because of his symptoms.  He underwent an arteriogram was not a candidate for endovascular approach.  On 04/07/2017 he underwent a left common femoral artery to below-knee popliteal artery bypass with a composite great saphenous vein graft (using 2 segments of saphenous vein).  Comes in for his first outpatient visit.  Postoperative ABIs on the left showed triphasic Doppler signals in the posterior tibial position with a biphasic dorsalis pedis signal.  ABI was 100%.  On the right side he has 95%.  Patient denies any claudication in the left leg or rest pain.  He has been gradually resuming his normal activities.  Current Outpatient Medications  Medication Sig Dispense Refill  . acetaminophen (TYLENOL) 650 MG CR tablet Take 650 mg by mouth every 8 (eight) hours as needed for pain.    Marland Kitchen aspirin EC 81 MG tablet Take 81 mg by mouth daily.    Marland Kitchen atorvastatin (LIPITOR) 80 MG tablet TAKE 1 TABLET BY MOUTH EVERY DAY AT 6PM 90 tablet 1  . docusate sodium (COLACE) 100 MG capsule Take 100 mg by mouth 2 (two) times daily.  5  . dofetilide (TIKOSYN) 250 MCG capsule Take 1 capsule (250 mcg total) 2 (two) times daily by mouth. 180 capsule 3  . DULoxetine (CYMBALTA) 60 MG capsule Take 60 mg by mouth every evening.    . magnesium oxide (MAG-OX) 400 (241.3 MG) MG tablet Take 1 tablet (400 mg total) by mouth daily. (Patient taking differently: Take 400 mg by mouth every evening. ) 30 tablet 11  . metoprolol succinate (TOPROL-XL) 50 MG 24 hr tablet TAKE 1 TABLET (50 MG TOTAL) BY MOUTH DAILY. TAKE WITH OR IMMEDIATELY FOLLOWING A MEAL. 90 tablet 3  . Multiple Vitamin (MULTIVITAMIN WITH MINERALS) TABS tablet Take 1 tablet by mouth daily.     .  nitroGLYCERIN (NITROSTAT) 0.4 MG SL tablet PLACE 1 TABLET UNDER TONGUE EVERY 5 MINUTES AS NEEDED FOR CHEST PAIN MAX 3 TABLETS 25 tablet 3  . NON FORMULARY at bedtime. CPAP    . oxyCODONE (OXY IR/ROXICODONE) 5 MG immediate release tablet Take 1 tablet (5 mg total) by mouth every 4 (four) hours as needed for moderate pain. 30 tablet 0  . pantoprazole (PROTONIX) 40 MG tablet TAKE 1 TABLET BY MOUTH EVERY DAY 30 tablet 11  . potassium chloride (K-DUR,KLOR-CON) 10 MEQ tablet TAKE 1 TABLET BY MOUTH DAILY 30 tablet 0  . rivaroxaban (XARELTO) 20 MG TABS tablet Take 1 tablet (20 mg total) daily with supper by mouth. 90 tablet 3   No current facility-administered medications for this visit.     REVIEW OF SYSTEMS:  [X]  denotes positive finding, [ ]  denotes negative finding Cardiac  Comments:  Chest pain or chest pressure:    Shortness of breath upon exertion:    Short of breath when lying flat:    Irregular heart rhythm:    Constitutional    Fever or chills:     PHYSICAL EXAM:   Vitals:   04/22/17 1424  BP: 130/80  Pulse: 67  Resp: 20  Temp: 97.7 F (36.5 C)  TempSrc: Oral  SpO2: 95%  Weight: 265 lb (120.2 kg)  Height: 5\' 7"  (1.702 m)  GENERAL: The patient is a well-nourished male, in no acute distress. The vital signs are documented above. CARDIOVASCULAR: There is a regular rate and rhythm. PULMONARY: There is good air exchange bilaterally without wheezing or rales. His incisions in the left leg are healing nicely.  He has brisk Doppler signals in the left foot. Mild left lower extremity swelling  DATA:   NO NEW DATA.  MEDICAL ISSUES:   STATUS POST LEFT FEMOROPOPLITEAL BYPASS: This patient underwent a left common femoral artery to below-knee popliteal artery bypass with a composite saphenous vein graft using 2 segments of saphenous vein.  The patient is doing well postoperatively.  I have encouraged him to elevate his leg and we have discussed the proper positioning for this.  I  will see him back in 3 months with a duplex of his graft and ABIs.  He knows to call sooner if he has problems.  He is on aspirin and is on a statin.  He is also on Xarelto.  [name] [facility] [phone]

## 2017-04-29 ENCOUNTER — Ambulatory Visit: Payer: Self-pay | Admitting: Internal Medicine

## 2017-05-23 ENCOUNTER — Other Ambulatory Visit: Payer: Self-pay | Admitting: Internal Medicine

## 2017-05-25 NOTE — Telephone Encounter (Signed)
Rx has been sent to the pharmacy electronically. ° °

## 2017-07-07 ENCOUNTER — Other Ambulatory Visit: Payer: Self-pay

## 2017-07-07 DIAGNOSIS — I739 Peripheral vascular disease, unspecified: Secondary | ICD-10-CM

## 2017-07-08 ENCOUNTER — Other Ambulatory Visit: Payer: Self-pay

## 2017-07-08 ENCOUNTER — Encounter: Payer: Self-pay | Admitting: Vascular Surgery

## 2017-07-08 ENCOUNTER — Ambulatory Visit (HOSPITAL_COMMUNITY)
Admission: RE | Admit: 2017-07-08 | Discharge: 2017-07-08 | Disposition: A | Discharge status: Home / Self Care | Payer: BLUE CROSS/BLUE SHIELD | Source: Ambulatory Visit | Attending: Vascular Surgery | Admitting: Vascular Surgery

## 2017-07-08 ENCOUNTER — Ambulatory Visit (INDEPENDENT_AMBULATORY_CARE_PROVIDER_SITE_OTHER): Payer: BLUE CROSS/BLUE SHIELD | Admitting: Vascular Surgery

## 2017-07-08 VITALS — BP 134/78 | HR 64 | Temp 97.3°F | Resp 18 | Ht 67.0 in | Wt 264.0 lb

## 2017-07-08 DIAGNOSIS — Z95828 Presence of other vascular implants and grafts: Secondary | ICD-10-CM | POA: Diagnosis not present

## 2017-07-08 DIAGNOSIS — I739 Peripheral vascular disease, unspecified: Secondary | ICD-10-CM

## 2017-07-08 DIAGNOSIS — E785 Hyperlipidemia, unspecified: Secondary | ICD-10-CM | POA: Diagnosis not present

## 2017-07-08 DIAGNOSIS — Z87891 Personal history of nicotine dependence: Secondary | ICD-10-CM | POA: Diagnosis not present

## 2017-07-08 DIAGNOSIS — I252 Old myocardial infarction: Secondary | ICD-10-CM | POA: Insufficient documentation

## 2017-07-08 NOTE — Progress Notes (Signed)
Patient name: Paul Todd MRN: [id number] DOB: [birth year] Sex: male  REASON FOR VISIT:   Follow-up after left lower extremity bypass.  HPI:   Paul Todd is a pleasant 57 y.o. male who I last saw on 04/22/2017.  He had presented with disabling left lower extremity claudication.  On 04/07/2017 he underwent a left common femoral artery to below-knee popliteal artery bypass with a composite great saphenous vein graft.  I used 2 segments of saphenous vein.  When I last saw him on 04/22/2017, patient had moderate leg swelling.  His bypass graft was patent.  He was set up for a 65-month follow-up visit.  Of note he is on Xarelto for atrial fibrillation.  He continues to have right calf claudication and has a known right superficial femoral artery occlusion.  His left calf claudication has resolved but he has some pain behind the knee when he flexes it.  His swelling in the left leg has improved.  He denies any history of rest pain or nonhealing ulcers.  He also complains of some paresthesias in his toes and has a history of back pain.  I suspect that the symptoms are related to his back.  He has had one previous back operation and is being considered for another.  Past Medical History:  Diagnosis Date  . Arthritis       . CAD (coronary artery disease)    multivessel per cath 02/12/13, s/p stenting to RCA  . Chronic lower back pain   . Dyslipidemia   . Hx of Bell's palsy   . Hypertension   . Myocardial infarction ([place]) 02/12/2013   . Nephrolithiasis   . OSA on CPAP   . PE (pulmonary thromboembolism) ([place]) 02/2014  . Persistent atrial fibrillation (HCC)         Family History  Problem Relation Age of Onset  . Heart disease Mother   . Diabetes Mother   . Stroke Mother   . Diabetes Father     SOCIAL HISTORY: He quit smoking in 2015. Social History   Tobacco Use  . Smoking status: Former Smoker    Packs/day: 0.50    Years: 35.00    Pack years: 17.50    Types: Cigarettes    Last  attempt to quit: 02/12/2013    Years since quitting: 4.4  . Smokeless tobacco: Never Used  Substance Use Topics  . Alcohol use: No    Comment: 07/21/2014 "never drank much; might have a couple drinks/yr since MI 02/2013"    No Known Allergies  Current Outpatient Medications  Medication Sig Dispense Refill  . acetaminophen (TYLENOL) 650 MG CR tablet Take 650 mg by mouth every 8 (eight) hours as needed for pain.    Marland Kitchen aspirin EC 81 MG tablet Take 81 mg by mouth daily.    Marland Kitchen atorvastatin (LIPITOR) 80 MG tablet TAKE 1 TABLET BY MOUTH EVERY DAY AT 6PM 90 tablet 2  . docusate sodium (COLACE) 100 MG capsule Take 100 mg by mouth 2 (two) times daily.  5  . dofetilide (TIKOSYN) 250 MCG capsule Take 1 capsule (250 mcg total) 2 (two) times daily by mouth. 180 capsule 3  . DULoxetine (CYMBALTA) 60 MG capsule Take 60 mg by mouth every evening.    . magnesium oxide (MAG-OX) 400 (241.3 MG) MG tablet Take 1 tablet (400 mg total) by mouth daily. (Patient not taking: Reported on 07/08/2017) 30 tablet 11  . metoprolol succinate (TOPROL-XL) 50 MG 24 hr tablet TAKE 1  TABLET (50 MG TOTAL) BY MOUTH DAILY. TAKE WITH OR IMMEDIATELY FOLLOWING A MEAL. 90 tablet 3  . Multiple Vitamin (MULTIVITAMIN WITH MINERALS) TABS tablet Take 1 tablet by mouth daily.     . nitroGLYCERIN (NITROSTAT) 0.4 MG SL tablet PLACE 1 TABLET UNDER TONGUE EVERY 5 MINUTES AS NEEDED FOR CHEST PAIN MAX 3 TABLETS 25 tablet 3  . NON FORMULARY at bedtime. CPAP    . oxyCODONE (OXY IR/ROXICODONE) 5 MG immediate release tablet Take 1 tablet (5 mg total) by mouth every 4 (four) hours as needed for moderate pain. (Patient not taking: Reported on 07/08/2017) 30 tablet 0  . pantoprazole (PROTONIX) 40 MG tablet TAKE 1 TABLET BY MOUTH EVERY DAY 30 tablet 11  . potassium chloride (K-DUR,KLOR-CON) 10 MEQ tablet TAKE 1 TABLET BY MOUTH DAILY (Patient not taking: Reported on 07/08/2017) 30 tablet 0  . rivaroxaban (XARELTO) 20 MG TABS tablet Take 1 tablet (20 mg total) daily  with supper by mouth. 90 tablet 3   No current facility-administered medications for this visit.     REVIEW OF SYSTEMS:  [X]  denotes positive finding, [ ]  denotes negative finding Cardiac  Comments:  Chest pain or chest pressure:    Shortness of breath upon exertion:    Short of breath when lying flat:    Irregular heart rhythm:        Vascular    Pain in calf, thigh, or hip brought on by ambulation: x   Pain in feet at night that wakes you up from your sleep:  x   Blood clot in your veins:    Leg swelling:  x       Pulmonary    Oxygen at home:    Productive cough:     Wheezing:         Neurologic    Sudden weakness in arms or legs:     Sudden numbness in arms or legs:     Sudden onset of difficulty speaking or slurred speech:    Temporary loss of vision in one eye:     Problems with dizziness:         Gastrointestinal    Blood in stool:     Vomited blood:         Genitourinary    Burning when urinating:     Blood in urine:        Psychiatric    Major depression:         Hematologic    Bleeding problems:    Problems with blood clotting too easily:        Skin    Rashes or ulcers:        Constitutional    Fever or chills:     PHYSICAL EXAM:   Vitals:   07/08/17 1356  BP: 134/78  Pulse: 64  Resp: 18  Temp: (!) 97.3 F (36.3 C)  TempSrc: Oral  SpO2: 96%  Weight: 264 lb (119.7 kg)  Height: 5\' 7"  (1.702 m)    GENERAL: The patient is a well-nourished male, in no acute distress. The vital signs are documented above. CARDIAC: There is a regular rate and rhythm.  VASCULAR: I do not detect carotid bruits. He has palpable femoral pulses although they are somewhat difficult to assess because of his size. I cannot palpate popliteal or pedal pulses. Both feet appear adequately perfused. He has bilateral lower extremity swelling. PULMONARY: There is good air exchange bilaterally without wheezing or rales. ABDOMEN: Soft and non-tender with  normal pitched  bowel sounds.  MUSCULOSKELETAL: There are no major deformities or cyanosis. NEUROLOGIC: No focal weakness or paresthesias are detected. SKIN: There are no ulcers or rashes noted. PSYCHIATRIC: The patient has a normal affect.  DATA:    GRAFT DUPLEX: I have independently interpreted his graft duplex in the left leg.  The bypass graft is patent.  There are some elevated velocities at the inflow site but there is triphasic flow here.  LOWER EXTREMITY ARTERIAL DOPPLER STUDY: I have independently interpreted his lower extremity arterial Doppler study today.  On the left side, which is the side with the bypass, he has a triphasic dorsalis pedis signal and a biphasic posterior tibial signal.  ABI is 94%.  Toe pressure is 114 mmHg.  On the right side, there is a biphasic dorsalis pedis signal and a monophasic posterior tibial signal.  ABI is 70%.  Toe pressure is 68 mmHg.  MEDICAL ISSUES:   PERIPHERAL VASCULAR DISEASE: Patient's left femoropopliteal bypass graft is patent with some elevated velocities at the inflow but triphasic flow in this area and a normal ABI and triphasic signals in the left foot.  I recommended a follow-up duplex scan of his graft in 6 months and ABIs at that time.  He is not a smoker.  He is on aspirin and is on a statin.  I have encouraged him to stay as active as possible.  He does have a known superficial femoral artery occlusion on the right and hopefully we can avoid bypass on the right side.  We will continue to follow this.  He also has significant back pain hip pain and also some paresthesias in his toes.  He is going to follow-up with his back surgeon in the near future.  I will see him back in 6 months.  He knows to call sooner if he has problems.  [name] [facility] [phone]

## 2017-07-13 ENCOUNTER — Other Ambulatory Visit: Payer: Self-pay

## 2017-07-13 DIAGNOSIS — I739 Peripheral vascular disease, unspecified: Secondary | ICD-10-CM

## 2017-07-20 ENCOUNTER — Other Ambulatory Visit (HOSPITAL_COMMUNITY): Payer: Self-pay | Admitting: Internal Medicine

## 2017-07-29 DIAGNOSIS — M4310 Spondylolisthesis, site unspecified: Secondary | ICD-10-CM | POA: Diagnosis not present

## 2017-07-29 DIAGNOSIS — Z6841 Body Mass Index (BMI) 40.0 and over, adult: Secondary | ICD-10-CM | POA: Diagnosis not present

## 2017-07-30 ENCOUNTER — Telehealth: Payer: Self-pay

## 2017-07-30 NOTE — Telephone Encounter (Signed)
   [facility] Pre-operative Risk Assessment    Request for surgical clearance:  1. What type of surgery is being performed? Medial Branch Block   2. When is this surgery scheduled? 08/24/17   3. What type of clearance is required (medical clearance vs. Pharmacy clearance to hold med vs. Both)? Both  4. Are there any medications that need to be held prior to surgery and how long? Xarelto    5. Practice name and name of physician performing surgery? [facility] - Dr. Clydell Hakim   6. What is your office phone number [phone]     7.   What is your office fax number [phone]  8.   Anesthesia type (None, local, MAC, general) ? Not specified    [name] 07/30/2017, 2:28 PM  _________________________________________________________________   (provider comments below)

## 2017-07-31 NOTE — Telephone Encounter (Signed)
Patient with diagnosis of atrial fibrillation on Xarelto for anticoagulation.    Procedure: medial branch block  Date of procedure: 08/24/17  CHADS2-VASc score of  4 (CHF,  stroke/tia/PE x 2, CAD, )  CrCl 143.7 Platelet count 221  Per office protocol, patient can hold Xarelto for 3 days prior to procedure.     Patient should restart Xarelto on the evening of procedure or day after, at discretion of procedure MD

## 2017-07-31 NOTE — Telephone Encounter (Signed)
Will ask pharmacy to weigh in on holding Xarelto prior to proposed neurologic procedure, then contact the patient to make sure he has had no change in his cardiac status since LOV.  Kerin Ransom PA-C 07/31/2017 4:09 PM

## 2017-08-04 NOTE — Telephone Encounter (Signed)
   Primary Cardiologist: Pixie Casino, MD  Chart reviewed as part of pre-operative protocol coverage. Given past medical history of PE, PAF, back surgery and pain, and vascular surgery and time since last visit, based on ACC/AHA guidelines, Paul Todd would be at acceptable risk for the planned procedure without further cardiovascular testing.  See below for anticoagulation instructions.    I will route this recommendation to the requesting party via Epic fax function and remove from pre-op pool.  Please call with questions.  Cecilie Kicks, NP 08/04/2017, 3:37 PM

## 2017-08-07 DIAGNOSIS — I10 Essential (primary) hypertension: Secondary | ICD-10-CM | POA: Diagnosis not present

## 2017-08-07 DIAGNOSIS — E785 Hyperlipidemia, unspecified: Secondary | ICD-10-CM | POA: Diagnosis not present

## 2017-08-07 DIAGNOSIS — I519 Heart disease, unspecified: Secondary | ICD-10-CM | POA: Diagnosis not present

## 2017-08-07 DIAGNOSIS — I739 Peripheral vascular disease, unspecified: Secondary | ICD-10-CM | POA: Diagnosis not present

## 2017-08-20 ENCOUNTER — Other Ambulatory Visit: Payer: Self-pay

## 2017-08-20 ENCOUNTER — Inpatient Hospital Stay (HOSPITAL_COMMUNITY)
Admission: EM | Admit: 2017-08-20 | Discharge: 2017-09-02 | DRG: 234 | Disposition: A | Discharge status: Home / Self Care | Payer: BLUE CROSS/BLUE SHIELD | Attending: Cardiothoracic Surgery | Admitting: Cardiothoracic Surgery

## 2017-08-20 ENCOUNTER — Encounter (HOSPITAL_COMMUNITY): Payer: Self-pay | Admitting: Emergency Medicine

## 2017-08-20 ENCOUNTER — Emergency Department (HOSPITAL_COMMUNITY): Payer: BLUE CROSS/BLUE SHIELD

## 2017-08-20 DIAGNOSIS — J9811 Atelectasis: Secondary | ICD-10-CM | POA: Diagnosis not present

## 2017-08-20 DIAGNOSIS — I252 Old myocardial infarction: Secondary | ICD-10-CM | POA: Diagnosis not present

## 2017-08-20 DIAGNOSIS — I4892 Unspecified atrial flutter: Secondary | ICD-10-CM | POA: Diagnosis not present

## 2017-08-20 DIAGNOSIS — I10 Essential (primary) hypertension: Secondary | ICD-10-CM | POA: Diagnosis present

## 2017-08-20 DIAGNOSIS — Z87442 Personal history of urinary calculi: Secondary | ICD-10-CM | POA: Diagnosis not present

## 2017-08-20 DIAGNOSIS — G8929 Other chronic pain: Secondary | ICD-10-CM | POA: Diagnosis present

## 2017-08-20 DIAGNOSIS — I4891 Unspecified atrial fibrillation: Secondary | ICD-10-CM | POA: Diagnosis not present

## 2017-08-20 DIAGNOSIS — J90 Pleural effusion, not elsewhere classified: Secondary | ICD-10-CM | POA: Diagnosis not present

## 2017-08-20 DIAGNOSIS — I481 Persistent atrial fibrillation: Secondary | ICD-10-CM | POA: Diagnosis not present

## 2017-08-20 DIAGNOSIS — Z79899 Other long term (current) drug therapy: Secondary | ICD-10-CM | POA: Diagnosis not present

## 2017-08-20 DIAGNOSIS — R079 Chest pain, unspecified: Secondary | ICD-10-CM

## 2017-08-20 DIAGNOSIS — Z01810 Encounter for preprocedural cardiovascular examination: Secondary | ICD-10-CM | POA: Diagnosis not present

## 2017-08-20 DIAGNOSIS — Z951 Presence of aortocoronary bypass graft: Secondary | ICD-10-CM

## 2017-08-20 DIAGNOSIS — Z8249 Family history of ischemic heart disease and other diseases of the circulatory system: Secondary | ICD-10-CM

## 2017-08-20 DIAGNOSIS — Z86718 Personal history of other venous thrombosis and embolism: Secondary | ICD-10-CM | POA: Diagnosis not present

## 2017-08-20 DIAGNOSIS — E785 Hyperlipidemia, unspecified: Secondary | ICD-10-CM | POA: Diagnosis present

## 2017-08-20 DIAGNOSIS — Z86711 Personal history of pulmonary embolism: Secondary | ICD-10-CM | POA: Diagnosis not present

## 2017-08-20 DIAGNOSIS — Z5181 Encounter for therapeutic drug level monitoring: Secondary | ICD-10-CM | POA: Diagnosis not present

## 2017-08-20 DIAGNOSIS — R0789 Other chest pain: Secondary | ICD-10-CM | POA: Diagnosis not present

## 2017-08-20 DIAGNOSIS — I25110 Atherosclerotic heart disease of native coronary artery with unstable angina pectoris: Principal | ICD-10-CM

## 2017-08-20 DIAGNOSIS — Z833 Family history of diabetes mellitus: Secondary | ICD-10-CM

## 2017-08-20 DIAGNOSIS — I493 Ventricular premature depolarization: Secondary | ICD-10-CM | POA: Diagnosis not present

## 2017-08-20 DIAGNOSIS — Z6841 Body Mass Index (BMI) 40.0 and over, adult: Secondary | ICD-10-CM

## 2017-08-20 DIAGNOSIS — M545 Low back pain: Secondary | ICD-10-CM | POA: Diagnosis present

## 2017-08-20 DIAGNOSIS — I340 Nonrheumatic mitral (valve) insufficiency: Secondary | ICD-10-CM | POA: Diagnosis not present

## 2017-08-20 DIAGNOSIS — M1990 Unspecified osteoarthritis, unspecified site: Secondary | ICD-10-CM | POA: Diagnosis not present

## 2017-08-20 DIAGNOSIS — I255 Ischemic cardiomyopathy: Secondary | ICD-10-CM | POA: Diagnosis not present

## 2017-08-20 DIAGNOSIS — E8770 Fluid overload, unspecified: Secondary | ICD-10-CM | POA: Diagnosis not present

## 2017-08-20 DIAGNOSIS — I2510 Atherosclerotic heart disease of native coronary artery without angina pectoris: Secondary | ICD-10-CM

## 2017-08-20 DIAGNOSIS — Z4682 Encounter for fitting and adjustment of non-vascular catheter: Secondary | ICD-10-CM | POA: Diagnosis not present

## 2017-08-20 DIAGNOSIS — Z7901 Long term (current) use of anticoagulants: Secondary | ICD-10-CM

## 2017-08-20 DIAGNOSIS — E8881 Metabolic syndrome: Secondary | ICD-10-CM | POA: Diagnosis present

## 2017-08-20 DIAGNOSIS — I480 Paroxysmal atrial fibrillation: Secondary | ICD-10-CM | POA: Diagnosis not present

## 2017-08-20 DIAGNOSIS — D62 Acute posthemorrhagic anemia: Secondary | ICD-10-CM | POA: Diagnosis not present

## 2017-08-20 DIAGNOSIS — Z955 Presence of coronary angioplasty implant and graft: Secondary | ICD-10-CM | POA: Diagnosis not present

## 2017-08-20 DIAGNOSIS — Z7982 Long term (current) use of aspirin: Secondary | ICD-10-CM

## 2017-08-20 DIAGNOSIS — Z419 Encounter for procedure for purposes other than remedying health state, unspecified: Secondary | ICD-10-CM

## 2017-08-20 DIAGNOSIS — I517 Cardiomegaly: Secondary | ICD-10-CM | POA: Diagnosis not present

## 2017-08-20 DIAGNOSIS — G4733 Obstructive sleep apnea (adult) (pediatric): Secondary | ICD-10-CM | POA: Diagnosis present

## 2017-08-20 DIAGNOSIS — Z452 Encounter for adjustment and management of vascular access device: Secondary | ICD-10-CM | POA: Diagnosis not present

## 2017-08-20 DIAGNOSIS — E1151 Type 2 diabetes mellitus with diabetic peripheral angiopathy without gangrene: Secondary | ICD-10-CM | POA: Diagnosis not present

## 2017-08-20 DIAGNOSIS — E782 Mixed hyperlipidemia: Secondary | ICD-10-CM | POA: Diagnosis not present

## 2017-08-20 DIAGNOSIS — E119 Type 2 diabetes mellitus without complications: Secondary | ICD-10-CM

## 2017-08-20 DIAGNOSIS — Z87891 Personal history of nicotine dependence: Secondary | ICD-10-CM | POA: Diagnosis not present

## 2017-08-20 DIAGNOSIS — Z7984 Long term (current) use of oral hypoglycemic drugs: Secondary | ICD-10-CM

## 2017-08-20 HISTORY — DX: Type 2 diabetes mellitus without complications: E11.9

## 2017-08-20 HISTORY — DX: Personal history of urinary calculi: Z87.442

## 2017-08-20 HISTORY — DX: Myoneural disorder, unspecified: G70.9

## 2017-08-20 LAB — BASIC METABOLIC PANEL
Anion gap: 9 (ref 5–15)
BUN: 17 mg/dL (ref 6–20)
CHLORIDE: 110 mmol/L (ref 98–111)
CO2: 22 mmol/L (ref 22–32)
CREATININE: 1 mg/dL (ref 0.61–1.24)
Calcium: 9 mg/dL (ref 8.9–10.3)
GFR calc Af Amer: >60 mL/min (ref >60)
GFR calc non Af Amer: >60 mL/min (ref >60)
Glucose, Bld: 109 mg/dL — ABNORMAL HIGH (ref 70–99)
Potassium: 3.7 mmol/L (ref 3.5–5.1)
SODIUM: 141 mmol/L (ref 135–145)

## 2017-08-20 LAB — I-STAT TROPONIN, ED: Troponin i, poc: 0.03 ng/mL (ref 0.00–0.08)

## 2017-08-20 LAB — CBC
HCT: 45.3 % (ref 39.0–52.0)
Hemoglobin: 14.2 g/dL (ref 13.0–17.0)
MCH: 28.9 pg (ref 26.0–34.0)
MCHC: 31.3 g/dL (ref 30.0–36.0)
MCV: 92.1 fL (ref 78.0–100.0)
PLATELETS: 206 10*3/uL (ref 150–400)
RBC: 4.92 MIL/uL (ref 4.22–5.81)
RDW: 14.5 % (ref 11.5–15.5)
WBC: 11.7 10*3/uL — AB (ref 4.0–10.5)

## 2017-08-20 NOTE — ED Notes (Signed)
No reply x3 

## 2017-08-20 NOTE — ED Triage Notes (Signed)
Pt c/o central chest pain that radiated to the jaw that started 30 minutes PTA. Pt denies pain at this time. Hx MI with stents x 3.

## 2017-08-21 ENCOUNTER — Other Ambulatory Visit: Payer: Self-pay

## 2017-08-21 ENCOUNTER — Observation Stay (HOSPITAL_COMMUNITY): Payer: BLUE CROSS/BLUE SHIELD

## 2017-08-21 ENCOUNTER — Encounter (HOSPITAL_COMMUNITY)
Admission: EM | Disposition: A | Discharge status: Home / Self Care | Payer: Self-pay | Source: Home / Self Care | Attending: Cardiothoracic Surgery

## 2017-08-21 ENCOUNTER — Other Ambulatory Visit: Payer: Self-pay | Admitting: *Deleted

## 2017-08-21 ENCOUNTER — Ambulatory Visit (HOSPITAL_BASED_OUTPATIENT_CLINIC_OR_DEPARTMENT_OTHER): Payer: BLUE CROSS/BLUE SHIELD

## 2017-08-21 ENCOUNTER — Encounter (HOSPITAL_COMMUNITY): Payer: Self-pay | Admitting: *Deleted

## 2017-08-21 DIAGNOSIS — Z7901 Long term (current) use of anticoagulants: Secondary | ICD-10-CM

## 2017-08-21 DIAGNOSIS — I2510 Atherosclerotic heart disease of native coronary artery without angina pectoris: Secondary | ICD-10-CM

## 2017-08-21 DIAGNOSIS — I25110 Atherosclerotic heart disease of native coronary artery with unstable angina pectoris: Secondary | ICD-10-CM

## 2017-08-21 DIAGNOSIS — I4891 Unspecified atrial fibrillation: Secondary | ICD-10-CM

## 2017-08-21 DIAGNOSIS — E785 Hyperlipidemia, unspecified: Secondary | ICD-10-CM

## 2017-08-21 DIAGNOSIS — I259 Chronic ischemic heart disease, unspecified: Secondary | ICD-10-CM

## 2017-08-21 DIAGNOSIS — R079 Chest pain, unspecified: Secondary | ICD-10-CM

## 2017-08-21 HISTORY — PX: LEFT HEART CATH AND CORONARY ANGIOGRAPHY: CATH118249

## 2017-08-21 HISTORY — PX: INTRAVASCULAR PRESSURE WIRE/FFR STUDY: CATH118243

## 2017-08-21 LAB — PULMONARY FUNCTION TEST
FEF 25-75 Post: 2.96 L/sec
FEF 25-75 Pre: 2.56 L/sec
FEF2575-%Change-Post: 15 %
FEF2575-%Pred-Post: 105 %
FEF2575-%Pred-Pre: 91 %
FEV1-%Change-Post: 2 %
FEV1-%Pred-Post: 70 %
FEV1-%Pred-Pre: 68 %
FEV1-Post: 2.32 L
FEV1-Pre: 2.26 L
FEV1FVC-%Change-Post: 1 %
FEV1FVC-%Pred-Pre: 109 %
FEV6-%Change-Post: 1 %
FEV6-%Pred-Post: 66 %
FEV6-%Pred-Pre: 65 %
FEV6-Post: 2.74 L
FEV6-Pre: 2.72 L
FEV6FVC-%Pred-Post: 105 %
FEV6FVC-%Pred-Pre: 105 %
FVC-%Change-Post: 1 %
FVC-%Pred-Post: 63 %
FVC-%Pred-Pre: 62 %
FVC-Post: 2.74 L
FVC-Pre: 2.72 L
Post FEV1/FVC ratio: 85 %
Post FEV6/FVC ratio: 100 %
Pre FEV1/FVC ratio: 83 %
Pre FEV6/FVC Ratio: 100 %

## 2017-08-21 LAB — HEPARIN LEVEL (UNFRACTIONATED): Heparin Unfractionated: 0.3 IU/mL (ref 0.30–0.70)

## 2017-08-21 LAB — I-STAT CHEM 8, ED
BUN: 19 mg/dL (ref 6–20)
CREATININE: 0.9 mg/dL (ref 0.61–1.24)
Calcium, Ion: 1.12 mmol/L — ABNORMAL LOW (ref 1.15–1.40)
Chloride: 108 mmol/L (ref 98–111)
Glucose, Bld: 113 mg/dL — ABNORMAL HIGH (ref 70–99)
HEMATOCRIT: 42 % (ref 39.0–52.0)
Hemoglobin: 14.3 g/dL (ref 13.0–17.0)
POTASSIUM: 3.7 mmol/L (ref 3.5–5.1)
Sodium: 143 mmol/L (ref 135–145)
TCO2: 23 mmol/L (ref 22–32)

## 2017-08-21 LAB — I-STAT TROPONIN, ED: Troponin i, poc: 0 ng/mL (ref 0.00–0.08)

## 2017-08-21 LAB — HIV ANTIBODY (ROUTINE TESTING W REFLEX): HIV Screen 4th Generation wRfx: NONREACTIVE

## 2017-08-21 LAB — PROTIME-INR
INR: 1.02
Prothrombin Time: 13.3 seconds (ref 11.4–15.2)

## 2017-08-21 LAB — TROPONIN I: Troponin I: <0.03 ng/mL (ref <0.03)

## 2017-08-21 LAB — APTT: aPTT: 26 seconds (ref 24–36)

## 2017-08-21 LAB — MRSA PCR SCREENING: MRSA by PCR: NEGATIVE

## 2017-08-21 LAB — ECHOCARDIOGRAM COMPLETE

## 2017-08-21 LAB — POCT ACTIVATED CLOTTING TIME: Activated Clotting Time: 252 seconds

## 2017-08-21 SURGERY — LEFT HEART CATH AND CORONARY ANGIOGRAPHY
Anesthesia: LOCAL

## 2017-08-21 MED ORDER — ASPIRIN 81 MG PO CHEW: 81.0000 mg | CHEWABLE_TABLET | ORAL | Status: AC

## 2017-08-21 MED ORDER — SODIUM CHLORIDE 0.9 % IV SOLN: 250.0000 mL | INTRAVENOUS | Status: DC | PRN

## 2017-08-21 MED ORDER — HEPARIN SODIUM (PORCINE) 1000 UNIT/ML IJ SOLN
INTRAMUSCULAR | Status: AC
  Filled 2017-08-21: qty 1

## 2017-08-21 MED ORDER — SODIUM CHLORIDE 0.9% FLUSH: 3.0000 mL | INTRAVENOUS | Status: DC | PRN

## 2017-08-21 MED ORDER — ALBUTEROL SULFATE (2.5 MG/3ML) 0.083% IN NEBU: 2.5000 mg | INHALATION_SOLUTION | RESPIRATORY_TRACT | Status: DC | PRN

## 2017-08-21 MED ORDER — HEPARIN (PORCINE) IN NACL 1000-0.9 UT/500ML-% IV SOLN
INTRAVENOUS | Status: AC
  Filled 2017-08-21: qty 1000

## 2017-08-21 MED ORDER — HEPARIN (PORCINE) IN NACL 1000-0.9 UT/500ML-% IV SOLN
INTRAVENOUS | Status: DC | PRN
  Administered 2017-08-21: 500 mL

## 2017-08-21 MED ORDER — FENTANYL CITRATE (PF) 100 MCG/2ML IJ SOLN
INTRAMUSCULAR | Status: DC | PRN
  Administered 2017-08-21: 25 ug via INTRAVENOUS

## 2017-08-21 MED ORDER — MIDAZOLAM HCL 2 MG/2ML IJ SOLN
INTRAMUSCULAR | Status: AC
  Filled 2017-08-21: qty 2

## 2017-08-21 MED ORDER — ATORVASTATIN CALCIUM 80 MG PO TABS
80.0000 mg | ORAL_TABLET | Freq: Every day | ORAL | Status: DC
  Administered 2017-08-21 – 2017-09-02 (×13): 80 mg via ORAL
  Filled 2017-08-21 (×13): qty 1

## 2017-08-21 MED ORDER — METOPROLOL SUCCINATE ER 50 MG PO TB24
50.0000 mg | ORAL_TABLET | Freq: Every day | ORAL | Status: DC
  Administered 2017-08-21 – 2017-08-23 (×3): 50 mg via ORAL
  Filled 2017-08-21 (×3): qty 1

## 2017-08-21 MED ORDER — ADULT MULTIVITAMIN W/MINERALS CH
1.0000 | ORAL_TABLET | Freq: Every day | ORAL | Status: DC
  Administered 2017-08-21 – 2017-08-23 (×3): 1 via ORAL
  Filled 2017-08-21 (×3): qty 1

## 2017-08-21 MED ORDER — SODIUM CHLORIDE 0.9 % WEIGHT BASED INFUSION: 1.0000 mL/kg/h | INTRAVENOUS | Status: DC

## 2017-08-21 MED ORDER — ASPIRIN EC 81 MG PO TBEC
81.0000 mg | DELAYED_RELEASE_TABLET | Freq: Every day | ORAL | Status: DC
  Administered 2017-08-21 – 2017-08-23 (×3): 81 mg via ORAL
  Filled 2017-08-21 (×3): qty 1

## 2017-08-21 MED ORDER — VERAPAMIL HCL 2.5 MG/ML IV SOLN
INTRAVENOUS | Status: DC | PRN
  Administered 2017-08-21: 10 mL via INTRA_ARTERIAL

## 2017-08-21 MED ORDER — ADENOSINE 12 MG/4ML IV SOLN
INTRAVENOUS | Status: AC
  Filled 2017-08-21: qty 12

## 2017-08-21 MED ORDER — NITROGLYCERIN 1 MG/10 ML FOR IR/CATH LAB
INTRA_ARTERIAL | Status: AC
  Filled 2017-08-21: qty 10

## 2017-08-21 MED ORDER — LIDOCAINE HCL (PF) 1 % IJ SOLN
INTRAMUSCULAR | Status: AC
  Filled 2017-08-21: qty 30

## 2017-08-21 MED ORDER — FENTANYL CITRATE (PF) 100 MCG/2ML IJ SOLN
INTRAMUSCULAR | Status: AC
  Filled 2017-08-21: qty 2

## 2017-08-21 MED ORDER — RIVAROXABAN 20 MG PO TABS: 20.0000 mg | ORAL_TABLET | Freq: Every day | ORAL | Status: DC

## 2017-08-21 MED ORDER — ADENOSINE 12 MG/4ML IV SOLN
INTRAVENOUS | Status: AC
  Filled 2017-08-21: qty 4

## 2017-08-21 MED ORDER — HEPARIN (PORCINE) IN NACL 100-0.45 UNIT/ML-% IJ SOLN
1400.0000 [IU]/h | INTRAMUSCULAR | Status: DC
  Administered 2017-08-21: 1200 [IU]/h via INTRAVENOUS
  Administered 2017-08-22 – 2017-08-23 (×2): 1400 [IU]/h via INTRAVENOUS
  Filled 2017-08-21 (×3): qty 250

## 2017-08-21 MED ORDER — SODIUM CHLORIDE 0.9% FLUSH: 3.0000 mL | Freq: Two times a day (BID) | INTRAVENOUS | Status: DC

## 2017-08-21 MED ORDER — DOFETILIDE 250 MCG PO CAPS
250.0000 ug | ORAL_CAPSULE | Freq: Two times a day (BID) | ORAL | Status: DC
  Administered 2017-08-21 – 2017-08-23 (×6): 250 ug via ORAL
  Filled 2017-08-21 (×9): qty 1

## 2017-08-21 MED ORDER — DOCUSATE SODIUM 100 MG PO CAPS
100.0000 mg | ORAL_CAPSULE | Freq: Two times a day (BID) | ORAL | Status: DC
  Administered 2017-08-21 – 2017-08-23 (×7): 100 mg via ORAL
  Filled 2017-08-21 (×7): qty 1

## 2017-08-21 MED ORDER — DULOXETINE HCL 60 MG PO CPEP
60.0000 mg | ORAL_CAPSULE | Freq: Every evening | ORAL | Status: DC
  Administered 2017-08-21 – 2017-09-02 (×13): 60 mg via ORAL
  Filled 2017-08-21 (×13): qty 1

## 2017-08-21 MED ORDER — LIDOCAINE HCL (PF) 1 % IJ SOLN
INTRAMUSCULAR | Status: DC | PRN
  Administered 2017-08-21: 2 mL

## 2017-08-21 MED ORDER — POTASSIUM CHLORIDE CRYS ER 20 MEQ PO TBCR
20.0000 meq | EXTENDED_RELEASE_TABLET | Freq: Once | ORAL | Status: AC
  Administered 2017-08-21: 20 meq via ORAL
  Filled 2017-08-21: qty 1

## 2017-08-21 MED ORDER — MIDAZOLAM HCL 2 MG/2ML IJ SOLN
INTRAMUSCULAR | Status: DC | PRN
  Administered 2017-08-21: 1 mg via INTRAVENOUS

## 2017-08-21 MED ORDER — VERAPAMIL HCL 2.5 MG/ML IV SOLN
INTRAVENOUS | Status: AC
  Filled 2017-08-21: qty 2

## 2017-08-21 MED ORDER — ONDANSETRON HCL 4 MG/2ML IJ SOLN: 4.0000 mg | Freq: Four times a day (QID) | INTRAMUSCULAR | Status: DC | PRN

## 2017-08-21 MED ORDER — SODIUM CHLORIDE 0.9 % WEIGHT BASED INFUSION: 3.0000 mL/kg/h | INTRAVENOUS | Status: DC

## 2017-08-21 MED ORDER — ASPIRIN 81 MG PO CHEW
162.0000 mg | CHEWABLE_TABLET | Freq: Once | ORAL | Status: AC
  Administered 2017-08-21: 162 mg via ORAL
  Filled 2017-08-21: qty 2

## 2017-08-21 MED ORDER — PANTOPRAZOLE SODIUM 40 MG PO TBEC
40.0000 mg | DELAYED_RELEASE_TABLET | Freq: Every day | ORAL | Status: DC
  Administered 2017-08-21 – 2017-08-23 (×3): 40 mg via ORAL
  Filled 2017-08-21 (×3): qty 1

## 2017-08-21 MED ORDER — HEPARIN SODIUM (PORCINE) 1000 UNIT/ML IJ SOLN
INTRAMUSCULAR | Status: DC | PRN
  Administered 2017-08-21: 5000 [IU] via INTRAVENOUS
  Administered 2017-08-21: 6000 [IU] via INTRAVENOUS

## 2017-08-21 MED ORDER — ADENOSINE (DIAGNOSTIC) 140MCG/KG/MIN
INTRAVENOUS | Status: DC | PRN
  Administered 2017-08-21: 140 ug/kg/min via INTRAVENOUS

## 2017-08-21 MED ORDER — ALBUTEROL SULFATE (2.5 MG/3ML) 0.083% IN NEBU
2.5000 mg | INHALATION_SOLUTION | Freq: Once | RESPIRATORY_TRACT | Status: AC
  Administered 2017-08-21: 2.5 mg via RESPIRATORY_TRACT

## 2017-08-21 MED ORDER — SODIUM CHLORIDE 0.9 % IV SOLN
INTRAVENOUS | Status: AC
  Administered 2017-08-21: 13:00:00 via INTRAVENOUS

## 2017-08-21 MED ORDER — ACETAMINOPHEN 325 MG PO TABS: 650.0000 mg | ORAL_TABLET | ORAL | Status: DC | PRN

## 2017-08-21 MED ORDER — HEPARIN (PORCINE) IN NACL 100-0.45 UNIT/ML-% IJ SOLN
1200.0000 [IU]/h | INTRAMUSCULAR | Status: DC
  Administered 2017-08-21: 1200 [IU]/h via INTRAVENOUS
  Filled 2017-08-21: qty 250

## 2017-08-21 MED ORDER — NITROGLYCERIN 1 MG/10 ML FOR IR/CATH LAB
INTRA_ARTERIAL | Status: DC | PRN
  Administered 2017-08-21: 200 ug via INTRACORONARY

## 2017-08-21 SURGICAL SUPPLY — 17 items
CATH 5FR JL3.5 JR4 ANG PIG MP (CATHETERS) ×2 IMPLANT
CATH LAUNCHER 6FR EBU3.5 (CATHETERS) ×2 IMPLANT
CATH MICROCATH NAVVUS (MICROCATHETER) ×1 IMPLANT
DEVICE RAD COMP TR BAND LRG (VASCULAR PRODUCTS) ×2 IMPLANT
GLIDESHEATH SLEND SS 6F .021 (SHEATH) ×2 IMPLANT
GUIDEWIRE INQWIRE 1.5J.035X260 (WIRE) ×1 IMPLANT
INQWIRE 1.5J .035X260CM (WIRE) ×2
KIT ESSENTIALS PG (KITS) ×2 IMPLANT
KIT HEART LEFT (KITS) ×2 IMPLANT
MICROCATHETER NAVVUS (MICROCATHETER) ×2
PACK CARDIAC CATHETERIZATION (CUSTOM PROCEDURE TRAY) ×2 IMPLANT
SYR CONTROL 10ML ANGIOGRAPHIC (SYRINGE) ×2 IMPLANT
SYR MEDRAD MARK V 150ML (SYRINGE) ×2 IMPLANT
TRANSDUCER W/STOPCOCK (MISCELLANEOUS) ×2 IMPLANT
TUBING CIL FLEX 10 FLL-RA (TUBING) ×2 IMPLANT
VALVE MANIFOLD 3 PORT W/RA/ON (MISCELLANEOUS) ×2 IMPLANT
WIRE COUGAR XT STRL 190CM (WIRE) ×2 IMPLANT

## 2017-08-21 NOTE — H&P (View-Only) (Signed)
Progress Note  Patient Name: Paul Todd   Date of Encounter: 08/21/2017  Primary Cardiologist: Hilty  Subjective   No recurrent chest pain  Inpatient Medications    Scheduled Meds: . aspirin EC  81 mg Oral Daily  . atorvastatin  80 mg Oral q1800  . docusate sodium  100 mg Oral BID  . dofetilide  250 mcg Oral BID  . DULoxetine  60 mg Oral QPM  . metoprolol succinate  50 mg Oral Daily  . multivitamin with minerals  1 tablet Oral Daily  . pantoprazole  40 mg Oral Daily   Continuous Infusions: . heparin 1,200 Units/hr (08/21/17 0457)   PRN Meds: acetaminophen, albuterol, ondansetron (ZOFRAN) IV   Vital Signs    Vitals:   08/21/17 0300 08/21/17 0345 08/21/17 0411 08/21/17 0721  BP: 127/71 121/69 116/61 126/71  Pulse: 63 (!) 55 (!) 52 66  Resp: 17 19 18    Temp:   97.6 F (36.4 C) (!) 97.5 F (36.4 C)  TempSrc:   Oral Oral  SpO2: 95% 97% 97% 96%    Intake/Output Summary (Last 24 hours) at 08/21/2017 0915 Last data filed at 08/21/2017 0457 Gross per 24 hour  Intake 120 ml  Output -  Net 120 ml    I/O since admission:  +120  There were no vitals filed for this visit.  Telemetry    Sinus - Personally Reviewed  ECG    ECG (independently read by me): NSR at 60   Physical Exam   BP 126/71 (BP Location: Right Arm)   Pulse 66   Temp (!) 97.5 F (36.4 C) (Oral) Comment: Just had drink  Resp 18   SpO2 96%  General: Alert, oriented, no distress.  Skin: normal turgor, no rashes, warm and dry HEENT: Normocephalic, atraumatic. Pupils equal round and reactive to light; sclera anicteric; extraocular muscles intact; Nose without nasal septal hypertrophy Mouth/Parynx benign; Mallinpatti scale 3 Neck: No JVD, no carotid bruits; normal carotid upstroke Lungs: clear to ausculatation and percussion; no wheezing or rales Chest wall: without tenderness to palpitation Heart: PMI not displaced, RRR, s1 s2 normal, 1/6 systolic murmur, no diastolic murmur, no rubs,  gallops, thrills, or heaves Abdomen: soft, nontender; no hepatosplenomehaly, BS+; abdominal aorta nontender and not dilated by palpation. Back: no CVA tenderness Pulses 2+ Musculoskeletal: full range of motion, normal strength, no joint deformities Extremities: no clubbing cyanosis or edema, Homan's sign negative  Neurologic: grossly nonfocal; Cranial nerves grossly wnl Psychologic: Normal mood and affect   Labs    Chemistry Recent Labs  Lab 08/20/17 1824 08/21/17 0011  NA 141 143  K 3.7 3.7  CL 110 108  CO2 22  --   GLUCOSE 109* 113*  BUN 17 19  CREATININE 1.00 0.90  CALCIUM 9.0  --   GFRNONAA >60  --   GFRAA >60  --   ANIONGAP 9  --      Hematology Recent Labs  Lab 08/20/17 1824 08/21/17 0011  WBC 11.7*  --   RBC 4.92  --   HGB 14.2 14.3  HCT 45.3 42.0  MCV 92.1  --   MCH 28.9  --   MCHC 31.3  --   RDW 14.5  --   PLT 206  --     Cardiac Enzymes Recent Labs  Lab 08/21/17 0342  TROPONINI <0.03    Recent Labs  Lab 08/20/17 1836 08/21/17 0009  TROPIPOC 0.03 0.00     BNPNo results for input(s): BNP,  PROBNP in the last 168 hours.   DDimer No results for input(s): DDIMER in the last 168 hours.   Lipid Panel     Component Value Date/Time   CHOL 195 01/28/2013 0939   TRIG 206 (H) 01/28/2013 0939   HDL 43 01/28/2013 0939   CHOLHDL 5.5 11/23/2012 0949   VLDL 46 (H) 11/23/2012 0949   LDLCALC 111 (H) 01/28/2013 0939    Radiology    Dg Chest 2 View  Result Date: 08/20/2017 CLINICAL DATA:  Central chest pain EXAM: CHEST - 2 VIEW COMPARISON:  05/09/2016 FINDINGS: Heart and mediastinal contours are within normal limits. No focal opacities or effusions. No acute bony abnormality. IMPRESSION: No active cardiopulmonary disease. Electronically Signed   By: Rolm Baptise M.D.   On: 08/20/2017 19:35    Cardiac Studies   Cath/PCI to RCA 02/20/2013; staged PCI to LAD 02/14/2013  Patient Profile     57 year old man with known coronary disease and peripheral  vascular disease, who presents with an episode of chest discomfort concerning for unstable angina.  Assessment & Plan    1. Chest pain worrisome for USAP.  S/p STENT to totally occluded RCA in 02/2013 with staged PCI to proximal LAD several days later. Chest pain very similar to prior discomfort. Discussed definitive cath. [facility] particularly since he travels extensively teaching collision courses, and was tentatilely scheduled to be in [place] next week. I have reviewed the risks, indications, and alternatives to cardiac catheterization, possible angioplasty, and stenting with the patient. Risks include but are not limited to bleeding, infection, vascular injury, stroke, myocardial infection, arrhythmia, kidney injury, radiation-related injury in the case of prolonged fluoroscopy use, emergency cardiac surgery, and death. The patient understands the risks of serious complication is 1-2 in 8250 with diagnostic cardiac cath and 1-2% or less with angioplasty/stenting.   2. PVD: s/p L fem-pop  3. 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  4. OSA  5. HLD: f/u lipid panel; now on atorvastatin 80 mg  Signed, Troy Sine, MD, [facility] 08/21/2017, 9:15 AM

## 2017-08-21 NOTE — Progress Notes (Addendum)
ANTICOAGULATION CONSULT NOTE  Pharmacy Consult for Heparin Indication: H/O PE and PAF  No Known Allergies  Patient Measurements:   Heparin Dosing Weight: 95 kg  Vital Signs: Temp: 97.5 F (36.4 C) (08/16 0721) Temp Source: Oral (08/16 0721) BP: 147/74 (08/16 1153) Pulse Rate: 64 (08/16 1153)  Labs: Recent Labs    08/20/17 1824 08/21/17 0011 08/21/17 0334 08/21/17 0342  HGB 14.2 14.3  --   --   HCT 45.3 42.0  --   --   PLT 206  --   --   --   APTT  --   --  26  --   LABPROT  --   --  13.3  --   INR  --   --  1.02  --   HEPARINUNFRC  --   --  0.30  --   CREATININE 1.00 0.90  --   --   TROPONINI  --   --   --  <0.03    CrCl cannot be calculated (Unknown ideal weight.).   Medical History: Past Medical History:  Diagnosis Date  . Arthritis       . CAD (coronary artery disease)    multivessel per cath 02/12/13, s/p stenting to RCA  . Chronic lower back pain   . Diabetes mellitus without complication ([place])   . Dyslipidemia   . History of kidney stones   . Hx of Bell's palsy   . Hypertension   . Myocardial infarction ([place]) 02/12/2013   . Nephrolithiasis   . Neuromuscular disorder (HCC)    Bell's Palsy  . OSA on CPAP   . PE (pulmonary thromboembolism) ([place]) 02/2014  . Persistent atrial fibrillation (HCC)         Medications:  No current facility-administered medications on file prior to encounter.    Current Outpatient Medications on File Prior to Encounter  Medication Sig Dispense Refill  . acetaminophen (TYLENOL) 650 MG CR tablet Take 650 mg by mouth every 8 (eight) hours as needed for pain.    Marland Kitchen albuterol (PROVENTIL HFA;VENTOLIN HFA) 108 (90 Base) MCG/ACT inhaler Inhale 1-2 puffs into the lungs every 4 (four) hours as needed for wheezing or shortness of breath.     Marland Kitchen aspirin EC 81 MG tablet Take 81 mg by mouth daily.    Marland Kitchen atorvastatin (LIPITOR) 80 MG tablet TAKE 1 TABLET BY MOUTH EVERY DAY AT 6PM (Patient taking differently: Take 80 mg by mouth daily at 6  PM. ) 90 tablet 2  . docusate sodium (COLACE) 100 MG capsule Take 100 mg by mouth 2 (two) times daily.  5  . dofetilide (TIKOSYN) 250 MCG capsule Take 1 capsule (250 mcg total) 2 (two) times daily by mouth. 180 capsule 3  . DULoxetine (CYMBALTA) 60 MG capsule Take 60 mg by mouth every evening.    Marland Kitchen JARDIANCE 10 MG TABS tablet Take 10 mg by mouth daily.  3  . magnesium oxide (MAG-OX) 400 (241.3 MG) MG tablet Take 1 tablet (400 mg total) by mouth daily. 30 tablet 11  . metoprolol succinate (TOPROL-XL) 50 MG 24 hr tablet TAKE 1 TABLET (50 MG TOTAL) BY MOUTH DAILY. TAKE WITH OR IMMEDIATELY FOLLOWING A MEAL. 90 tablet 3  . Multiple Vitamin (MULTIVITAMIN WITH MINERALS) TABS tablet Take 1 tablet by mouth daily.     . nitroGLYCERIN (NITROSTAT) 0.4 MG SL tablet PLACE 1 TABLET UNDER TONGUE EVERY 5 MINUTES AS NEEDED FOR CHEST PAIN MAX 3 TABLETS (Patient taking differently: Place 0.4 mg under  the tongue every 5 (five) minutes as needed for chest pain. ) 25 tablet 3  . NON FORMULARY at bedtime. CPAP    . nystatin (MYCOSTATIN/NYSTOP) powder Apply 1 application topically 2 (two) times daily as needed for rash.  2  . pantoprazole (PROTONIX) 40 MG tablet TAKE 1 TABLET BY MOUTH EVERY DAY (Patient taking differently: Take 40 mg by mouth daily. ) 30 tablet 11  . potassium chloride (K-DUR,KLOR-CON) 10 MEQ tablet TAKE 1 TABLET BY MOUTH DAILY (Patient taking differently: Take 10 mEq by mouth daily. ) 30 tablet 0  . rivaroxaban (XARELTO) 20 MG TABS tablet Take 1 tablet (20 mg total) daily with supper by mouth. 90 tablet 3  . sildenafil (VIAGRA) 50 MG tablet Take 50 mg by mouth as needed for erectile dysfunction.   5  . oxyCODONE (OXY IR/ROXICODONE) 5 MG immediate release tablet Take 1 tablet (5 mg total) by mouth every 4 (four) hours as needed for moderate pain. (Patient not taking: Reported on 08/20/2017) 30 tablet 0     Assessment: 57 y.o. male admitted with chest pain, h/o Afib and PE and Xarelto on hold (last dose  8/14 in pm). Pharmacy dosing heparin. He is now s/p cath and for CABG consult. Heparin to restart 2 hours post TR band -baseline heparin level was 0.3  Goal of Therapy:  APTT 66-102 Heparin level 0.3-0.7 units/ml Monitor platelets by anticoagulation protocol: Yes   Plan:  -Restart heparin at 1300 units/hr 2 hours post TR band removal -Heparin level and aPTT in 6 hours and daily wth CBC daily  Hildred Laser, PharmD Clinical Pharmacist Please check Amion for pharmacy contact number

## 2017-08-21 NOTE — Progress Notes (Signed)
Progress Note  Patient Name: Paul Todd   Date of Encounter: 08/21/2017  Primary Cardiologist: Hilty  Subjective   No recurrent chest pain  Inpatient Medications    Scheduled Meds: . aspirin EC  81 mg Oral Daily  . atorvastatin  80 mg Oral q1800  . docusate sodium  100 mg Oral BID  . dofetilide  250 mcg Oral BID  . DULoxetine  60 mg Oral QPM  . metoprolol succinate  50 mg Oral Daily  . multivitamin with minerals  1 tablet Oral Daily  . pantoprazole  40 mg Oral Daily   Continuous Infusions: . heparin 1,200 Units/hr (08/21/17 0457)   PRN Meds: acetaminophen, albuterol, ondansetron (ZOFRAN) IV   Vital Signs    Vitals:   08/21/17 0300 08/21/17 0345 08/21/17 0411 08/21/17 0721  BP: 127/71 121/69 116/61 126/71  Pulse: 63 (!) 55 (!) 52 66  Resp: 17 19 18    Temp:   97.6 F (36.4 C) (!) 97.5 F (36.4 C)  TempSrc:   Oral Oral  SpO2: 95% 97% 97% 96%    Intake/Output Summary (Last 24 hours) at 08/21/2017 0915 Last data filed at 08/21/2017 0457 Gross per 24 hour  Intake 120 ml  Output -  Net 120 ml    I/O since admission:  +120  There were no vitals filed for this visit.  Telemetry    Sinus - Personally Reviewed  ECG    ECG (independently read by me): NSR at 60   Physical Exam   BP 126/71 (BP Location: Right Arm)   Pulse 66   Temp (!) 97.5 F (36.4 C) (Oral) Comment: Just had drink  Resp 18   SpO2 96%  General: Alert, oriented, no distress.  Skin: normal turgor, no rashes, warm and dry HEENT: Normocephalic, atraumatic. Pupils equal round and reactive to light; sclera anicteric; extraocular muscles intact; Nose without nasal septal hypertrophy Mouth/Parynx benign; Mallinpatti scale 3 Neck: No JVD, no carotid bruits; normal carotid upstroke Lungs: clear to ausculatation and percussion; no wheezing or rales Chest wall: without tenderness to palpitation Heart: PMI not displaced, RRR, s1 s2 normal, 1/6 systolic murmur, no diastolic murmur, no rubs,  gallops, thrills, or heaves Abdomen: soft, nontender; no hepatosplenomehaly, BS+; abdominal aorta nontender and not dilated by palpation. Back: no CVA tenderness Pulses 2+ Musculoskeletal: full range of motion, normal strength, no joint deformities Extremities: no clubbing cyanosis or edema, Homan's sign negative  Neurologic: grossly nonfocal; Cranial nerves grossly wnl Psychologic: Normal mood and affect   Labs    Chemistry Recent Labs  Lab 08/20/17 1824 08/21/17 0011  NA 141 143  K 3.7 3.7  CL 110 108  CO2 22  --   GLUCOSE 109* 113*  BUN 17 19  CREATININE 1.00 0.90  CALCIUM 9.0  --   GFRNONAA >60  --   GFRAA >60  --   ANIONGAP 9  --      Hematology Recent Labs  Lab 08/20/17 1824 08/21/17 0011  WBC 11.7*  --   RBC 4.92  --   HGB 14.2 14.3  HCT 45.3 42.0  MCV 92.1  --   MCH 28.9  --   MCHC 31.3  --   RDW 14.5  --   PLT 206  --     Cardiac Enzymes Recent Labs  Lab 08/21/17 0342  TROPONINI <0.03    Recent Labs  Lab 08/20/17 1836 08/21/17 0009  TROPIPOC 0.03 0.00     BNPNo results for input(s): BNP,  PROBNP in the last 168 hours.   DDimer No results for input(s): DDIMER in the last 168 hours.   Lipid Panel     Component Value Date/Time   CHOL 195 01/28/2013 0939   TRIG 206 (H) 01/28/2013 0939   HDL 43 01/28/2013 0939   CHOLHDL 5.5 11/23/2012 0949   VLDL 46 (H) 11/23/2012 0949   LDLCALC 111 (H) 01/28/2013 0939    Radiology    Dg Chest 2 View  Result Date: 08/20/2017 CLINICAL DATA:  Central chest pain EXAM: CHEST - 2 VIEW COMPARISON:  05/09/2016 FINDINGS: Heart and mediastinal contours are within normal limits. No focal opacities or effusions. No acute bony abnormality. IMPRESSION: No active cardiopulmonary disease. Electronically Signed   By: Rolm Baptise M.D.   On: 08/20/2017 19:35    Cardiac Studies   Cath/PCI to RCA 02/20/2013; staged PCI to LAD 02/14/2013  Patient Profile     57 year old man with known coronary disease and peripheral  vascular disease, who presents with an episode of chest discomfort concerning for unstable angina.  Assessment & Plan    1. Chest pain worrisome for USAP.  S/p STENT to totally occluded RCA in 02/2013 with staged PCI to proximal LAD several days later. Chest pain very similar to prior discomfort. Discussed definitive cath. Pt wishes to [facility] this particularly since he travels extensively teaching collision courses, and was tentatilely scheduled to be in [place] next week. I have reviewed the risks, indications, and alternatives to cardiac catheterization, possible angioplasty, and stenting with the patient. Risks include but are not limited to bleeding, infection, vascular injury, stroke, myocardial infection, arrhythmia, kidney injury, radiation-related injury in the case of prolonged fluoroscopy use, emergency cardiac surgery, and death. The patient understands the risks of serious complication is 1-2 in 4128 with diagnostic cardiac cath and 1-2% or less with angioplasty/stenting.   2. PVD: s/p L fem-pop  3. 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  4. OSA  5. HLD: f/u lipid panel; now on atorvastatin 80 mg  Signed, Troy Sine, MD, [facility] 08/21/2017, 9:15 AM

## 2017-08-21 NOTE — Progress Notes (Signed)
  Echocardiogram 2D Echocardiogram has been performed.  Darlina Sicilian M 08/21/2017, 4:11 PM

## 2017-08-21 NOTE — Progress Notes (Signed)
ANTICOAGULATION CONSULT NOTE - Initial Consult  Pharmacy Consult for Heparin Indication: H/O PE and PAF  No Known Allergies  Patient Measurements:   Heparin Dosing Weight: 95 kg  Vital Signs: Temp: 98.5 F (36.9 C) (08/15 2214) Temp Source: Oral (08/15 2214) BP: 126/65 (08/16 0215) Pulse Rate: 53 (08/16 0215)  Labs: Recent Labs    08/20/17 1824 08/21/17 0011  HGB 14.2 14.3  HCT 45.3 42.0  PLT 206  --   CREATININE 1.00 0.90    CrCl cannot be calculated (Unknown ideal weight.).   Medical History: Past Medical History:  Diagnosis Date  . Arthritis       . CAD (coronary artery disease)    multivessel per cath 02/12/13, s/p stenting to RCA  . Chronic lower back pain   . Dyslipidemia   . Hx of Bell's palsy   . Hypertension   . Myocardial infarction ([place]) 02/12/2013   . Nephrolithiasis   . OSA on CPAP   . PE (pulmonary thromboembolism) ([place]) 02/2014  . Persistent atrial fibrillation (HCC)         Medications:  No current facility-administered medications on file prior to encounter.    Current Outpatient Medications on File Prior to Encounter  Medication Sig Dispense Refill  . acetaminophen (TYLENOL) 650 MG CR tablet Take 650 mg by mouth every 8 (eight) hours as needed for pain.    Marland Kitchen albuterol (PROVENTIL HFA;VENTOLIN HFA) 108 (90 Base) MCG/ACT inhaler Inhale 1-2 puffs into the lungs every 4 (four) hours as needed for wheezing or shortness of breath.     Marland Kitchen aspirin EC 81 MG tablet Take 81 mg by mouth daily.    Marland Kitchen atorvastatin (LIPITOR) 80 MG tablet TAKE 1 TABLET BY MOUTH EVERY DAY AT 6PM (Patient taking differently: Take 80 mg by mouth daily at 6 PM. ) 90 tablet 2  . docusate sodium (COLACE) 100 MG capsule Take 100 mg by mouth 2 (two) times daily.  5  . dofetilide (TIKOSYN) 250 MCG capsule Take 1 capsule (250 mcg total) 2 (two) times daily by mouth. 180 capsule 3  . DULoxetine (CYMBALTA) 60 MG capsule Take 60 mg by mouth every evening.    Marland Kitchen JARDIANCE 10 MG TABS tablet  Take 10 mg by mouth daily.  3  . magnesium oxide (MAG-OX) 400 (241.3 MG) MG tablet Take 1 tablet (400 mg total) by mouth daily. 30 tablet 11  . metoprolol succinate (TOPROL-XL) 50 MG 24 hr tablet TAKE 1 TABLET (50 MG TOTAL) BY MOUTH DAILY. TAKE WITH OR IMMEDIATELY FOLLOWING A MEAL. 90 tablet 3  . Multiple Vitamin (MULTIVITAMIN WITH MINERALS) TABS tablet Take 1 tablet by mouth daily.     . nitroGLYCERIN (NITROSTAT) 0.4 MG SL tablet PLACE 1 TABLET UNDER TONGUE EVERY 5 MINUTES AS NEEDED FOR CHEST PAIN MAX 3 TABLETS (Patient taking differently: Place 0.4 mg under the tongue every 5 (five) minutes as needed for chest pain. ) 25 tablet 3  . NON FORMULARY at bedtime. CPAP    . nystatin (MYCOSTATIN/NYSTOP) powder Apply 1 application topically 2 (two) times daily as needed for rash.  2  . pantoprazole (PROTONIX) 40 MG tablet TAKE 1 TABLET BY MOUTH EVERY DAY (Patient taking differently: Take 40 mg by mouth daily. ) 30 tablet 11  . potassium chloride (K-DUR,KLOR-CON) 10 MEQ tablet TAKE 1 TABLET BY MOUTH DAILY (Patient taking differently: Take 10 mEq by mouth daily. ) 30 tablet 0  . rivaroxaban (XARELTO) 20 MG TABS tablet Take 1 tablet (20 mg  total) daily with supper by mouth. 90 tablet 3  . sildenafil (VIAGRA) 50 MG tablet Take 50 mg by mouth as needed for erectile dysfunction.   5  . oxyCODONE (OXY IR/ROXICODONE) 5 MG immediate release tablet Take 1 tablet (5 mg total) by mouth every 4 (four) hours as needed for moderate pain. (Patient not taking: Reported on 08/20/2017) 30 tablet 0     Assessment: 57 y.o. male admitted with chest pain, h/o Afib and PE and Xarelto on hold, for heparin  Goal of Therapy:  APTT 66-102 Heparin level 0.3-0.7 units/ml Monitor platelets by anticoagulation protocol: Yes   Plan:  Start heparin 1200 units/hr Check aPTT  in 8 hours.   Paul Todd, Paul Todd 08/21/2017,2:41 AM

## 2017-08-21 NOTE — Interval H&P Note (Signed)
Cath Lab Visit (complete for each Cath Lab visit)  Clinical Evaluation Leading to the Procedure:   ACS: Yes.    Non-ACS:    Anginal Classification: CCS IV  Anti-ischemic medical therapy: Minimal Therapy (1 class of medications)  Non-Invasive Test Results: No non-invasive testing performed  Prior CABG: No previous CABG      History and Physical Interval Note:  08/21/2017 10:36 AM  Paul Todd  has presented today for surgery, with the diagnosis of nstemi  The various methods of treatment have been discussed with the patient and family. After consideration of risks, benefits and other options for treatment, the patient has consented to  Procedure(s): LEFT HEART CATH AND CORONARY ANGIOGRAPHY (N/A) as a surgical intervention .  The patient's history has been reviewed, patient examined, no change in status, stable for surgery.  I have reviewed the patient's chart and labs.  Questions were answered to the patient's satisfaction.     Sherren Mocha

## 2017-08-21 NOTE — Progress Notes (Signed)
ANTICOAGULATION CONSULT NOTE  Pharmacy Consult for Heparin Indication: H/O PE and PAF  No Known Allergies  Patient Measurements:   Heparin Dosing Weight: 95 kg  Vital Signs: Temp: 97.5 F (36.4 C) (08/16 1345) Temp Source: Oral (08/16 1345) BP: 127/82 (08/16 1345) Pulse Rate: 75 (08/16 1345)  Labs: Recent Labs    08/20/17 1824 08/21/17 0011 08/21/17 0334 08/21/17 0342  HGB 14.2 14.3  --   --   HCT 45.3 42.0  --   --   PLT 206  --   --   --   APTT  --   --  26  --   LABPROT  --   --  13.3  --   INR  --   --  1.02  --   HEPARINUNFRC  --   --  0.30  --   CREATININE 1.00 0.90  --   --   TROPONINI  --   --   --  <0.03    CrCl cannot be calculated (Unknown ideal weight.).   Medical History: Past Medical History:  Diagnosis Date  . Arthritis       . CAD (coronary artery disease)    multivessel per cath 02/12/13, s/p stenting to RCA  . Chronic lower back pain   . Diabetes mellitus without complication ([place])   . Dyslipidemia   . History of kidney stones   . Hx of Bell's palsy   . Hypertension   . Myocardial infarction ([place]) 02/12/2013   . Nephrolithiasis   . Neuromuscular disorder (HCC)    Bell's Palsy  . OSA on CPAP   . PE (pulmonary thromboembolism) ([place]) 02/2014  . Persistent atrial fibrillation (HCC)         Medications:  No current facility-administered medications on file prior to encounter.    Current Outpatient Medications on File Prior to Encounter  Medication Sig Dispense Refill  . acetaminophen (TYLENOL) 650 MG CR tablet Take 650 mg by mouth every 8 (eight) hours as needed for pain.    Marland Kitchen albuterol (PROVENTIL HFA;VENTOLIN HFA) 108 (90 Base) MCG/ACT inhaler Inhale 1-2 puffs into the lungs every 4 (four) hours as needed for wheezing or shortness of breath.     Marland Kitchen aspirin EC 81 MG tablet Take 81 mg by mouth daily.    Marland Kitchen atorvastatin (LIPITOR) 80 MG tablet TAKE 1 TABLET BY MOUTH EVERY DAY AT 6PM (Patient taking differently: Take 80 mg by mouth daily at 6  PM. ) 90 tablet 2  . docusate sodium (COLACE) 100 MG capsule Take 100 mg by mouth 2 (two) times daily.  5  . dofetilide (TIKOSYN) 250 MCG capsule Take 1 capsule (250 mcg total) 2 (two) times daily by mouth. 180 capsule 3  . DULoxetine (CYMBALTA) 60 MG capsule Take 60 mg by mouth every evening.    Marland Kitchen JARDIANCE 10 MG TABS tablet Take 10 mg by mouth daily.  3  . magnesium oxide (MAG-OX) 400 (241.3 MG) MG tablet Take 1 tablet (400 mg total) by mouth daily. 30 tablet 11  . metoprolol succinate (TOPROL-XL) 50 MG 24 hr tablet TAKE 1 TABLET (50 MG TOTAL) BY MOUTH DAILY. TAKE WITH OR IMMEDIATELY FOLLOWING A MEAL. 90 tablet 3  . Multiple Vitamin (MULTIVITAMIN WITH MINERALS) TABS tablet Take 1 tablet by mouth daily.     . nitroGLYCERIN (NITROSTAT) 0.4 MG SL tablet PLACE 1 TABLET UNDER TONGUE EVERY 5 MINUTES AS NEEDED FOR CHEST PAIN MAX 3 TABLETS (Patient taking differently: Place 0.4 mg under  the tongue every 5 (five) minutes as needed for chest pain. ) 25 tablet 3  . NON FORMULARY at bedtime. CPAP    . nystatin (MYCOSTATIN/NYSTOP) powder Apply 1 application topically 2 (two) times daily as needed for rash.  2  . pantoprazole (PROTONIX) 40 MG tablet TAKE 1 TABLET BY MOUTH EVERY DAY (Patient taking differently: Take 40 mg by mouth daily. ) 30 tablet 11  . potassium chloride (K-DUR,KLOR-CON) 10 MEQ tablet TAKE 1 TABLET BY MOUTH DAILY (Patient taking differently: Take 10 mEq by mouth daily. ) 30 tablet 0  . rivaroxaban (XARELTO) 20 MG TABS tablet Take 1 tablet (20 mg total) daily with supper by mouth. 90 tablet 3  . sildenafil (VIAGRA) 50 MG tablet Take 50 mg by mouth as needed for erectile dysfunction.   5  . oxyCODONE (OXY IR/ROXICODONE) 5 MG immediate release tablet Take 1 tablet (5 mg total) by mouth every 4 (four) hours as needed for moderate pain. (Patient not taking: Reported on 08/20/2017) 30 tablet 0     Assessment: 57 y.o. male admitted with chest pain, h/o Afib and PE and Xarelto on hold (last dose  8/14 in pm). Pharmacy dosing heparin. He is now s/p cath and for CABG consult. Heparin to restart 2 hours post TR band -baseline heparin level was 0.3  TR band off at 6pm - no bleeding per RN   Goal of Therapy:  APTT 66-102 Heparin level 0.3-0.7 units/ml Monitor platelets by anticoagulation protocol: Yes   Plan:  -Restart heparin at 1200 units/hr 2 hours post TR band removal (8pm)  -Heparin level and aPTT daily wth CBC daily  Bonnita Nasuti Pharm.D. CPP, BCPS Clinical Pharmacist [phone] 08/21/2017 6:08 PM

## 2017-08-21 NOTE — Progress Notes (Signed)
CARDIAC REHAB PHASE I   RN stated that pt was aware of need for OHS. Completed generic pre-op ed with pt and wife. Pt given IS, pt demonstrated 2250. Reviewed cardiac surgery booklet. Instructed pt on importance of walking and sternal precautions post-op, in-the-tube sheet given. OHS care guide reviewed with pt. Pt states his wife will be with him after discharge, and they have grown daughters who will also be able to help. Pt concerned about when he will be able to return to work. Encouraged pt to write down any questions they may have. Will continue to follow throughout pts stay.  [phone] Rufina Falco, RN BSN 08/21/2017 2:53 PM

## 2017-08-21 NOTE — Consult Note (Addendum)
[address]       [place] [address]             [phone]        Paul Todd  Medical Record #[id number] Date of Birth: 08-Sep-1960  Referring: No ref. provider found Primary Care: Everardo Beals, NP Primary Cardiologist:Kenneth Wells Guiles, MD  Chief Complaint:    Chief Complaint  Patient presents with  . Chest Pain    History of Present Illness:    The patient is a 57 year old male with a history of known coronary artery disease having had a previous non-STEMI in 2015 status post stenting to his right coronary artery and LAD.  A history of paroxysmal atrial fibrillation, pulmonary emboli on Xarelto, hypertension, PAD status post left femoropopliteal bypass who presented to the emergency department with substernal chest pain which radiated to the jaw.  The patient was working at his house for several hours doing some power washing when this developed.  This was familiar to him from symptoms of his previous myocardial infarction.  The pain did ease with rest but recurred and this prompted him to present to the emergency department for further evaluation.  Initial EKG showed no ischemic changes.  He was felt to require admission for further evaluation and treatment to include cardiac catheterization.  This was done today and the results are listed below.  He has a significant left main and LAD lesion and we are asked to consult for consideration of coronary artery surgical vascularization.    Current Activity/ Functional Status: Patient is independent with mobility/ambulation, transfers, ADL's, IADL's.   Zubrod Score: At the time of surgery this patient's most appropriate activity status/level should be described as: []     0    Normal activity, no symptoms [x]     1    Restricted in physical strenuous activity but ambulatory, able to do out light work []     2    Ambulatory and capable of self care, unable to do work activities, up and about                  more than 50%  Of the time                            []     3    Only limited self care, in bed greater than 50% of waking hours []     4    Completely disabled, no self care, confined to bed or chair []     5    Moribund  Past Medical History:  Diagnosis Date  . Arthritis       . CAD (coronary artery disease)    multivessel per cath 02/12/13, s/p stenting to RCA  . Chronic lower back pain   . Diabetes mellitus without complication ([place])   . Dyslipidemia   . History of kidney stones   . Hx of Bell's palsy   . Hypertension   . Myocardial infarction ([place]) 02/12/2013   . Nephrolithiasis   . Neuromuscular disorder (HCC)    Bell's Palsy  . OSA on CPAP   . PE (pulmonary thromboembolism) ([place]) 02/2014  . Persistent atrial fibrillation [facility])         Past Surgical History:  Procedure Laterality Date  . ABDOMINAL AORTOGRAM W/LOWER EXTREMITY N/A 01/26/2017   Procedure: ABDOMINAL AORTOGRAM W/LOWER EXTREMITY;  Surgeon: Angelia Mould, MD;  Location: [facility]  INVASIVE CV LAB;  Service: Cardiovascular;  Laterality: N/A;  . BACK SURGERY  2017   laminectomy  . CARDIOVERSION N/A 02/15/2013   Procedure: CARDIOVERSION;  Surgeon: Thayer Headings, MD;  Location: [place];  Service: Cardiovascular;  Laterality: N/A;  . CARDIOVERSION N/A 07/07/2014   Procedure: CARDIOVERSION;  Surgeon: Pixie Casino, MD;  Location: [place];  Service: Cardiovascular;  Laterality: N/A;  . CORONARY ANGIOPLASTY WITH STENT PLACEMENT  02/12/2013  . CYSTOSCOPY WITH RETROGRADE PYELOGRAM, URETEROSCOPY AND STENT PLACEMENT Left 09/02/2013   Procedure: CYSTOSCOPY WITH RETROGRADE PYELOGRAM/LEFT URETEROSCOPY/URETERAL STENT, with fulguration;  Surgeon: Festus Aloe, MD;  Location: [facility];  Service: Urology;  Laterality: Left;  . CYSTOSCOPY WITH URETEROSCOPY AND STENT PLACEMENT Left 09/16/2013   Procedure: CYSTOSCOPY WITH URETEROSCOPY AND STENT PLACEMENT;  Surgeon: Festus Aloe, MD;  Location: [facility];  Service:  Urology;  Laterality: Left;  . FEMORAL BYPASS Left 04/07/2017   below the knee popliteal  . FEMORAL-POPLITEAL BYPASS GRAFT Left 04/07/2017   Procedure: LEFT FEMORAL-BELOW KNEE POPLITEAL ARTERY BYPASS USING COMPOSITE LEFT GREATER SAPHENOUS VEIN;  Surgeon: Angelia Mould, MD;  Location: [place];  Service: Vascular;  Laterality: Left;  . HOLMIUM LASER APPLICATION Left 4/31/5400   Procedure: HOLMIUM LASER APPLICATION;  Surgeon: Festus Aloe, MD;  Location: [facility];  Service: Urology;  Laterality: Left;  . INTRAVASCULAR PRESSURE WIRE/FFR STUDY N/A 08/21/2017   Procedure: INTRAVASCULAR PRESSURE WIRE/FFR STUDY;  Surgeon: Sherren Mocha, MD;  Location: [place] CV LAB;  Service: Cardiovascular;  Laterality: N/A;  . LEFT HEART CATH AND CORONARY ANGIOGRAPHY N/A 08/21/2017   Procedure: LEFT HEART CATH AND CORONARY ANGIOGRAPHY;  Surgeon: Sherren Mocha, MD;  Location: [place] CV LAB;  Service: Cardiovascular;  Laterality: N/A;  . LEFT HEART CATHETERIZATION WITH CORONARY ANGIOGRAM N/A 02/12/2013   Procedure: LEFT HEART CATHETERIZATION WITH CORONARY ANGIOGRAM;  Surgeon: Troy Sine, MD;  Location: [facility] CATH LAB;  Service: Cardiovascular;  Laterality: N/A;  . PERCUTANEOUS CORONARY STENT INTERVENTION (PCI-S)  02/12/2013   Procedure: PERCUTANEOUS CORONARY STENT INTERVENTION (PCI-S);  Surgeon: Troy Sine, MD;  Location: [facility] CATH LAB;  Service: Cardiovascular;;  STEMI  . PERCUTANEOUS CORONARY STENT INTERVENTION (PCI-S) N/A 02/14/2013   Procedure: PERCUTANEOUS CORONARY STENT INTERVENTION (PCI-S);  Surgeon: Burnell Blanks, MD;  Location: [facility] CATH LAB;  Service: Cardiovascular;  Laterality: N/A;  Proximal LAD  . TEE WITHOUT CARDIOVERSION N/A 02/15/2013   Procedure: TRANSESOPHAGEAL ECHOCARDIOGRAM (TEE);  Surgeon: Thayer Headings, MD;  Location: [facility] ENDOSCOPY;  Service: Cardiovascular;  Laterality: N/A;    Social History   Tobacco Use  Smoking Status Former Smoker  . Packs/day: 0.50  . Years:  35.00  . Pack years: 17.50  . Types: Cigarettes  . Last attempt to quit: 02/12/2013  . Years since quitting: 4.5  Smokeless Tobacco Never Used    Social History   Substance and Sexual Activity  Alcohol Use No   Comment: 07/21/2014 "never drank much; might have a couple drinks/yr since MI 02/2013"     No Known Allergies  Current Facility-Administered Medications  Medication Dose Route Frequency Provider Last Rate Last Dose  . 0.9 %  sodium chloride infusion   Intravenous Continuous Sherren Mocha, MD 100 mL/hr at 08/21/17 1230    . 0.9 %  sodium chloride infusion  250 mL Intravenous PRN Sherren Mocha, MD      . acetaminophen (TYLENOL) tablet 650 mg  650 mg Oral Q4H PRN Chakravartti, Jaidip, MD      . albuterol (PROVENTIL) (2.5 MG/3ML)  0.083% nebulizer solution 2.5 mg  2.5 mg Inhalation Q4H PRN Chakravartti, Jaidip, MD      . aspirin EC tablet 81 mg  81 mg Oral Daily Chakravartti, Jaidip, MD   81 mg at 08/21/17 0902  . atorvastatin (LIPITOR) tablet 80 mg  80 mg Oral q1800 Chakravartti, Jaidip, MD   80 mg at 08/21/17 0449  . docusate sodium (COLACE) capsule 100 mg  100 mg Oral BID Chakravartti, Jaidip, MD   100 mg at 08/21/17 0902  . dofetilide (TIKOSYN) capsule 250 mcg  250 mcg Oral BID Doylene Canning, MD   Stopped at 08/21/17 0901  . DULoxetine (CYMBALTA) DR capsule 60 mg  60 mg Oral QPM Chakravartti, Jaidip, MD   60 mg at 08/21/17 0450  . heparin ADULT infusion 100 units/mL (25000 units/228mL sodium chloride 0.45%)  1,200 Units/hr Intravenous Continuous Dorothy Spark, MD 12 mL/hr at 08/21/17 0457 1,200 Units/hr at 08/21/17 0457  . metoprolol succinate (TOPROL-XL) 24 hr tablet 50 mg  50 mg Oral Daily Chakravartti, Jaidip, MD   50 mg at 08/21/17 0902  . multivitamin with minerals tablet 1 tablet  1 tablet Oral Daily Chakravartti, Jaidip, MD   1 tablet at 08/21/17 0902  . ondansetron (ZOFRAN) injection 4 mg  4 mg Intravenous Q6H PRN Chakravartti, Jaidip, MD      . pantoprazole  (PROTONIX) EC tablet 40 mg  40 mg Oral Daily Chakravartti, Jaidip, MD   40 mg at 08/21/17 0903  . sodium chloride flush (NS) 0.9 % injection 3 mL  3 mL Intravenous Q12H Sherren Mocha, MD      . sodium chloride flush (NS) 0.9 % injection 3 mL  3 mL Intravenous PRN Sherren Mocha, MD        Medications Prior to Admission  Medication Sig Dispense Refill Last Dose  . acetaminophen (TYLENOL) 650 MG CR tablet Take 650 mg by mouth every 8 (eight) hours as needed for pain.   unk  . albuterol (PROVENTIL HFA;VENTOLIN HFA) 108 (90 Base) MCG/ACT inhaler Inhale 1-2 puffs into the lungs every 4 (four) hours as needed for wheezing or shortness of breath.    unk  . aspirin EC 81 MG tablet Take 81 mg by mouth daily.   08/20/2017 at 0700  . atorvastatin (LIPITOR) 80 MG tablet TAKE 1 TABLET BY MOUTH EVERY DAY AT 6PM (Patient taking differently: Take 80 mg by mouth daily at 6 PM. ) 90 tablet 2 08/19/2017 at 1900  . docusate sodium (COLACE) 100 MG capsule Take 100 mg by mouth 2 (two) times daily.  5 08/20/2017 at Unknown time  . dofetilide (TIKOSYN) 250 MCG capsule Take 1 capsule (250 mcg total) 2 (two) times daily by mouth. 180 capsule 3 08/20/2017 at Unknown time  . DULoxetine (CYMBALTA) 60 MG capsule Take 60 mg by mouth every evening.   08/19/2017 at Unknown time  . JARDIANCE 10 MG TABS tablet Take 10 mg by mouth daily.  3 08/20/2017 at Unknown time  . magnesium oxide (MAG-OX) 400 (241.3 MG) MG tablet Take 1 tablet (400 mg total) by mouth daily. 30 tablet 11 08/19/2017 at Unknown time  . metoprolol succinate (TOPROL-XL) 50 MG 24 hr tablet TAKE 1 TABLET (50 MG TOTAL) BY MOUTH DAILY. TAKE WITH OR IMMEDIATELY FOLLOWING A MEAL. 90 tablet 3 08/20/2017 at 0700  . Multiple Vitamin (MULTIVITAMIN WITH MINERALS) TABS tablet Take 1 tablet by mouth daily.    08/20/2017 at Unknown time  . nitroGLYCERIN (NITROSTAT) 0.4 MG SL tablet PLACE 1  TABLET UNDER TONGUE EVERY 5 MINUTES AS NEEDED FOR CHEST PAIN MAX 3 TABLETS (Patient taking  differently: Place 0.4 mg under the tongue every 5 (five) minutes as needed for chest pain. ) 25 tablet 3 unk  . NON FORMULARY at bedtime. CPAP   08/19/2017 at Unknown time  . nystatin (MYCOSTATIN/NYSTOP) powder Apply 1 application topically 2 (two) times daily as needed for rash.  2   . pantoprazole (PROTONIX) 40 MG tablet TAKE 1 TABLET BY MOUTH EVERY DAY (Patient taking differently: Take 40 mg by mouth daily. ) 30 tablet 11 08/20/2017 at Unknown time  . potassium chloride (K-DUR,KLOR-CON) 10 MEQ tablet TAKE 1 TABLET BY MOUTH DAILY (Patient taking differently: Take 10 mEq by mouth daily. ) 30 tablet 0 08/20/2017 at Unknown time  . rivaroxaban (XARELTO) 20 MG TABS tablet Take 1 tablet (20 mg total) daily with supper by mouth. 90 tablet 3 08/19/2017 at 1900  . sildenafil (VIAGRA) 50 MG tablet Take 50 mg by mouth as needed for erectile dysfunction.   5 unk  . oxyCODONE (OXY IR/ROXICODONE) 5 MG immediate release tablet Take 1 tablet (5 mg total) by mouth every 4 (four) hours as needed for moderate pain. (Patient not taking: Reported on 08/20/2017) 30 tablet 0 Completed Course at Unknown time    Family History  Problem Relation Age of Onset  . Heart disease Mother   . Diabetes Mother   . Stroke Mother   . Diabetes Father      Review of Systems:   Review of Systems  Constitutional: Positive for diaphoresis. Negative for chills, fever, malaise/fatigue and weight loss.  HENT: Negative for congestion, ear discharge, ear pain, hearing loss, nosebleeds, sinus pain, sore throat and tinnitus.   Eyes: Negative for blurred vision, double vision, photophobia, pain, discharge and redness.  Respiratory: Positive for cough, sputum production, shortness of breath and wheezing. Negative for hemoptysis and stridor.   Cardiovascular: Positive for chest pain, palpitations and leg swelling. Negative for orthopnea and claudication.       Left leg swelling since left leg bypass surgery  Gastrointestinal: Positive for  blood in stool and heartburn. Negative for abdominal pain, constipation, diarrhea, melena, nausea and vomiting.       Hemorrhoids  Genitourinary: Negative.   Musculoskeletal: Positive for joint pain. Negative for falls, myalgias and neck pain.  Skin: Positive for itching and rash.       Right armpit fungal rash  Neurological: Positive for dizziness and sensory change. Negative for tingling, tremors, speech change, focal weakness, seizures, loss of consciousness, weakness and headaches.  Endo/Heme/Allergies: Negative for environmental allergies and polydipsia. Does not bruise/bleed easily.  Psychiatric/Behavioral: Positive for memory loss. Negative for depression, hallucinations, substance abuse and suicidal ideas. The patient is not nervous/anxious and does not have insomnia.     Physical Exam: BP 127/82 (BP Location: Left Arm)   Pulse 75   Temp (!) 97.5 F (36.4 C) (Oral)   Resp 10   SpO2 100%   Physical Exam  Constitutional: He is oriented to person, place, and time. He appears well-developed and well-nourished. No distress.  HENT:  Head: Normocephalic and atraumatic.  Mouth/Throat: Oropharynx is clear and moist.  Fair dentition    Eyes: Pupils are equal, round, and reactive to light. Conjunctivae and EOM are normal. Right eye exhibits no discharge. Left eye exhibits no discharge. No scleral icterus.  Neck: Normal range of motion. Neck supple. No JVD present. No tracheal deviation present. No thyromegaly present.  Cardiovascular: Normal rate,  regular rhythm and normal heart sounds. Exam reveals no gallop and no friction rub.  No murmur heard. + PT pulse bilat  Pulmonary/Chest: Effort normal and breath sounds normal. No stridor. No respiratory distress. He has no wheezes. He has no rales. He exhibits no tenderness.  Abdominal: Soft. Bowel sounds are normal. He exhibits no distension and no mass. There is no tenderness. There is no rebound and no guarding.  Morbidly obese     Musculoskeletal: Normal range of motion. He exhibits edema. He exhibits no tenderness or deformity.  Lymphadenopathy:    He has no cervical adenopathy.  Neurological: He is alert and oriented to person, place, and time. He exhibits normal muscle tone.  Skin: Skin is warm and dry. He is not diaphoretic. No erythema. No pallor.  Psychiatric: He has a normal mood and affect. His behavior is normal. Judgment and thought content normal.    Diagnostic Studies & Laboratory data:     The left ventricular ejection fraction is 55-65% by visual estimate.  LV end diastolic pressure is normal.  The left ventricular systolic function is normal.  Previously placed Prox RCA to Mid RCA stent (unknown type) is widely patent.  Dist LM lesion is 75% stenosed.  Ost LAD lesion is 75% stenosed.  Previously placed Prox LAD stent (unknown type) is widely patent.   1. Severe distal left main and ostial LAD stenosis confirmed by positive FFR analysis (0.76) 2. Continued patency of the stented segments in the LAD and RCA 3. Patent left circumflex 4. Normal LV systolic function  Recommend: TCTS consult for CABG. IV heparin, inpatient status with severe left main stenosis.   Indications   Coronary artery disease involving native coronary artery of native heart with unstable angina pectoris ([place]) [I25.110 (ICD-10-CM)]  Procedural Details/Technique   Technical Details INDICATION: Unstable angina. Pt with hx of PCI of the RCA and LAD in 2015 presents with recurrent symptoms of unstable angina. Cardiac enzymes negative but symptoms concerning for ACS.   PROCEDURAL DETAILS: The right wrist was prepped, draped, and anesthetized with 1% lidocaine. Using the modified Seldinger technique, a 5/6 French Slender sheath was introduced into the right radial artery. 3 mg of verapamil was administered through the sheath, weight-based unfractionated heparin was administered intravenously. Standard Judkins catheters were  used for selective coronary angiography and left ventriculography. FFR analysis of the left main/LAD is performed. Additional heparin is administered and a therapeutic ACT of 252 is achieved. An EBU-3.5 cm guide is used. A cougar wire is advanced into the LAD without difficulty. A Navvus catheter is advanced into the proximal LAD beyond the area of stenosis. Baseline FFR is 0.95. IV adenosine is administered and FFR with peak hyperemia is 0.76. The wire and guide are removed after final angiography is performed. Catheter exchanges were performed over an exchange length guidewire. There were no immediate procedural complications. A TR band was used for radial hemostasis at the completion of the procedure. The patient was transferred to the post catheterization recovery area for further monitoring.     Estimated blood loss <50 mL.  During this procedure the patient was administered the following to achieve and maintain moderate conscious sedation: Versed 1 mg, Fentanyl 25 mcg, while the patient's heart rate, blood pressure, and oxygen saturation were continuously monitored. The period of conscious sedation was 36 minutes, of which I was present face-to-face 100% of this time.  Coronary Findings   Diagnostic  Dominance: Right  Left Main  Dist LM lesion 75% stenosed  Dist LM lesion is 75% stenosed. FFR 0.95 at rest, 0.76 with adenosine  Left Anterior Descending  Ost LAD lesion 75% stenosed  Ost LAD lesion is 75% stenosed. The lesion is eccentric.  Prox LAD lesion 0% stenosed  Previously placed Prox LAD stent (unknown type) is widely patent.  Ramus Intermedius  The vessel exhibits minimal luminal irregularities.  Left Circumflex  The vessel exhibits minimal luminal irregularities.  Right Coronary Artery  Prox RCA to Mid RCA lesion 0% stenosed  Previously placed Prox RCA to Mid RCA stent (unknown type) is widely patent. Widely patent stented segment in the RCA. dominant vessel, no obstructive  disease.  Intervention   No interventions have been documented.  Wall Motion   Resting               Left Heart   Left Ventricle The left ventricular size is normal. The left ventricular systolic function is normal. LV end diastolic pressure is normal. The left ventricular ejection fraction is 55-65% by visual estimate. No regional wall motion abnormalities.  Coronary Diagrams   Diagnostic Diagram       Implants    No implant documentation for this case.  MERGE Images   Show images for CARDIAC CATHETERIZATION   Link to Procedure Log   Procedure Log    Hemo Data    Most Recent Value  AO Systolic Pressure 016 mmHg  AO Diastolic Pressure 77 mmHg  AO Mean 010 mmHg  LV Systolic Pressure 932 mmHg  LV Diastolic Pressure 8 mmHg  LV EDP 13 mmHg  AOp Systolic Pressure 355 mmHg  AOp Diastolic Pressure 75 mmHg  AOp Mean Pressure 732 mmHg  LVp Systolic Pressure 202 mmHg  LVp Diastolic Pressure 8 mmHg  LVp EDP Pressure 13 mmHg  FFR    Time Date FFR Ratio  Fractional Flow Reserve (FFR) 11:48 AM 08/21/17 0.76  Prox LAD    Order-Level Documents - 08/20/2017:   Scan on 08/21/2017 2:59 PM by Deneise Lever, MD  Scan on 08/21/2017 12:24 PM by Default, Provider, MD      Encounter-Level Documents - 08/20/2017:   Scan on 08/21/2017 9:04 AM by Default, Provider, MD  Scan on 08/21/2017 3:17 PM by Default, Provider, MD  Document on 08/21/2017 1:34 AM by Deno Etienne, DO: ED PB Billing Extract  Electronic signature on 08/20/2017 7:39 PM - Signed      Signed   Electronically signed by Sherren Mocha, MD on 08/21/17 at 1220 EDT     Recent Radiology Findings:   Dg Chest 2 View  Result Date: 08/20/2017 CLINICAL DATA:  Central chest pain EXAM: CHEST - 2 VIEW COMPARISON:  05/09/2016 FINDINGS: Heart and mediastinal contours are within normal limits. No focal opacities or effusions. No acute bony abnormality. IMPRESSION: No active cardiopulmonary disease. Electronically Signed    By: Rolm Baptise M.D.   On: 08/20/2017 19:35     I have independently reviewed the above radiologic studies and discussed with the patient   Recent Lab Findings: Lab Results  Component Value Date   WBC 11.7 (H) 08/20/2017   HGB 14.3 08/21/2017   HCT 42.0 08/21/2017   PLT 206 08/20/2017   GLUCOSE 113 (H) 08/21/2017   CHOL 195 01/28/2013   TRIG 206 (H) 01/28/2013   HDL 43 01/28/2013   LDLCALC 111 (H) 01/28/2013   ALT 28 03/31/2017   AST 23 03/31/2017   NA 143 08/21/2017   K 3.7 08/21/2017   CL 108 08/21/2017   CREATININE  0.90 08/21/2017   BUN 19 08/21/2017   CO2 22 08/20/2017   TSH 0.577 11/28/2014   INR 1.02 08/21/2017   HGBA1C 5.8 (H) 02/13/2013      Assessment / Plan:  Severe L main and LAD CAD- plan CABG                          Severe metabolic syndrome  Will require aggressive lifestyle/nutrition/pharmacalogic management long term     I  spent 55 minutes counseling the patient face to face.   John Giovanni, PA-C 08/21/2017 3:58 PM  Pager [phone] Patient examined, images of coronary angiogram and 2D echocardiogram personally reviewed and counseled with patient.  Patient discussed with Dr. Burt Knack for coordination of care.  57 year old obese diabetic with sleep apnea and previous PCI 4 years ago presents with unstable angina and a 75 to 80% left main stenosis.  Past medical history significant for previous DVT and pulmonary emboli 8 years ago on chronic Xarelto.  Patient also has intermittent atrial fibrillation.  Earlier this year the patient underwent left femoropopliteal bypass by Dr. Scot Dock  with saphenous vein.  PCI to the RCA is patent.  He will need bypass grafts because of his severe left main stenosis-targets include LAD, ramus intermediate, circumflex marginal.  LV function normal by echo.  He will remain on IV heparin until surgery for Xarelto washout.  Surgery scheduled for a.m. on August 19.  I discussed the procedure of CABG in detail with the patient and  wife including the benefits and risks.  He agrees to proceed.

## 2017-08-21 NOTE — Progress Notes (Signed)
Patient has home CPAP machine, able to set up and put on himself.  Sterile water provided for humidity.

## 2017-08-21 NOTE — H&P (Signed)
Cardiology History & Physical    Patient ID: Paul Todd MRN: [id number], DOB: 11-Dec-1960 Date of Encounter: 08/21/2017, 2:21 AM Primary Physician: Everardo Beals, NP  Chief Complaint: Chest pain   HPI: Paul Todd is a 57 y.o. male with history of CAD (NSTEMI 2015, status post RCA and LAD stents), paroxysmal atrial fibrillation, PE on Xarelto, hypertension, PAD s/p L fem-pop bypass, who presents with an episode of chest pain.  The patient was out working on the house for several hours, doing some power washing, when he noted the onset of acute substernal chest discomfort, with radiation into the jaw.  This was reminiscent of the symptoms that he had with his prior myocardial infarction.  After resting the pain eased off after 5 to 10 minutes, but briefly recurred upon becoming more active again.  After further resting, the pain eased off and has not recurred in the interim.  Given these concerning symptoms, he presented to the [facility] ED for evaluation.  In the ED, his vital signs have been reassuring.  His initial ECG showed no acute ischemic changes.  He had labs that showed normal renal function, and has had 2 sets of troponins which have been negative.  The patient has been compliant with all of his doses of Xarelto, and his last dose was on the evening of 8/14.  Upon my interview, the patient was chest pain-free.  Of note, in 2015, he had a relatively reassuring pharmacologic nuclear myocardial perfusion imaging study the week prior to his MI.  Past Medical History:  Diagnosis Date  . Arthritis       . CAD (coronary artery disease)    multivessel per cath 02/12/13, s/p stenting to RCA  . Chronic lower back pain   . Dyslipidemia   . Hx of Bell's palsy   . Hypertension   . Myocardial infarction ([place]) 02/12/2013   . Nephrolithiasis   . OSA on CPAP   . PE (pulmonary thromboembolism) ([place]) 02/2014  . Persistent atrial fibrillation [facility])          Surgical History:    Past Surgical History:  Procedure Laterality Date  . ABDOMINAL AORTOGRAM W/LOWER EXTREMITY N/A 01/26/2017   Procedure: ABDOMINAL AORTOGRAM W/LOWER EXTREMITY;  Surgeon: Angelia Mould, MD;  Location: [place] [facility];  Service: Cardiovascular;  Laterality: N/A;  . CARDIOVERSION N/A 02/15/2013   Procedure: CARDIOVERSION;  Surgeon: Thayer Headings, MD;  Location: [place];  Service: Cardiovascular;  Laterality: N/A;  . CARDIOVERSION N/A 07/07/2014   Procedure: CARDIOVERSION;  Surgeon: Pixie Casino, MD;  Location: [place];  Service: Cardiovascular;  Laterality: N/A;  . CORONARY ANGIOPLASTY WITH STENT PLACEMENT  02/12/2013  . CYSTOSCOPY WITH RETROGRADE PYELOGRAM, URETEROSCOPY AND STENT PLACEMENT Left 09/02/2013   Procedure: CYSTOSCOPY WITH RETROGRADE PYELOGRAM/LEFT URETEROSCOPY/URETERAL STENT, with fulguration;  Surgeon: Festus Aloe, MD;  Location: [facility];  Service: Urology;  Laterality: Left;  . CYSTOSCOPY WITH URETEROSCOPY AND STENT PLACEMENT Left 09/16/2013   Procedure: CYSTOSCOPY WITH URETEROSCOPY AND STENT PLACEMENT;  Surgeon: Festus Aloe, MD;  Location: [facility];  Service: Urology;  Laterality: Left;  . FEMORAL BYPASS Left 04/07/2017   below the knee popliteal  . FEMORAL-POPLITEAL BYPASS GRAFT Left 04/07/2017   Procedure: LEFT FEMORAL-BELOW KNEE POPLITEAL ARTERY BYPASS USING COMPOSITE LEFT GREATER SAPHENOUS VEIN;  Surgeon: Angelia Mould, MD;  Location: [place];  Service: Vascular;  Laterality: Left;  . HOLMIUM LASER APPLICATION Left 8/46/6599   Procedure: HOLMIUM LASER APPLICATION;  Surgeon: Rodman Key  Junious Silk, MD;  Location: [facility];  Service: Urology;  Laterality: Left;  . LEFT HEART CATHETERIZATION WITH CORONARY ANGIOGRAM N/A 02/12/2013   Procedure: LEFT HEART CATHETERIZATION WITH CORONARY ANGIOGRAM;  Surgeon: Troy Sine, MD;  Location: [facility] CATH LAB;  Service: Cardiovascular;  Laterality: N/A;  . PERCUTANEOUS CORONARY STENT INTERVENTION (PCI-S)  02/12/2013    Procedure: PERCUTANEOUS CORONARY STENT INTERVENTION (PCI-S);  Surgeon: Troy Sine, MD;  Location: [facility] C[facility] LAB;  Service: Cardiovascular;;  STEMI  . PERCUTANEOUS CORONARY STENT INTERVENTION (PCI-S) N/A 02/14/2013   Procedure: PERCUTANEOUS CORONARY STENT INTERVENTION (PCI-S);  Surgeon: Burnell Blanks, MD;  Location: [facility] CATH LAB;  Service: Cardiovascular;  Laterality: N/A;  Proximal LAD  . TEE WITHOUT CARDIOVERSION N/A 02/15/2013   Procedure: TRANSESOPHAGEAL ECHOCARDIOGRAM (TEE);  Surgeon: Thayer Headings, MD;  Location: [place];  Service: Cardiovascular;  Laterality: N/A;     Home Meds: Prior to Admission medications   Medication Sig Start Date End Date Taking? Authorizing Provider  acetaminophen (TYLENOL) 650 MG CR tablet Take 650 mg by mouth every 8 (eight) hours as needed for pain.   Yes [provider]  albuterol (PROVENTIL HFA;VENTOLIN HFA) 108 (90 Base) MCG/ACT inhaler Inhale 1-2 puffs into the lungs every 4 (four) hours as needed for wheezing or shortness of breath.  08/17/17  Yes [provider]  aspirin EC 81 MG tablet Take 81 mg by mouth daily.   Yes [provider]  atorvastatin (LIPITOR) 80 MG tablet TAKE 1 TABLET BY MOUTH EVERY DAY AT 6PM Patient taking differently: Take 80 mg by mouth daily at 6 PM.  05/25/17  Yes Paul, Nadean Corwin, MD  docusate sodium (COLACE) 100 MG capsule Take 100 mg by mouth 2 (two) times daily. 01/08/17  Yes [provider]  dofetilide (TIKOSYN) 250 MCG capsule Take 1 capsule (250 mcg total) 2 (two) times daily by mouth. 11/24/16  Yes Paul, Nadean Corwin, MD  DULoxetine (CYMBALTA) 60 MG capsule Take 60 mg by mouth every evening.   Yes [provider]  JARDIANCE 10 MG TABS tablet Take 10 mg by mouth daily. 08/10/17  Yes [provider]  magnesium oxide (MAG-OX) 400 (241.3 MG) MG tablet Take 1 tablet (400 mg total) by mouth daily. 07/25/14  Yes Seiler, Amber K, NP  metoprolol succinate (TOPROL-XL) 50 MG 24  hr tablet TAKE 1 TABLET (50 MG TOTAL) BY MOUTH DAILY. TAKE WITH OR IMMEDIATELY FOLLOWING A MEAL. 03/16/17  Yes Paul, Nadean Corwin, MD  Multiple Vitamin (MULTIVITAMIN WITH MINERALS) TABS tablet Take 1 tablet by mouth daily.    Yes [provider]  nitroGLYCERIN (NITROSTAT) 0.4 MG SL tablet PLACE 1 TABLET UNDER TONGUE EVERY 5 MINUTES AS NEEDED FOR CHEST PAIN MAX 3 TABLETS Patient taking differently: Place 0.4 mg under the tongue every 5 (five) minutes as needed for chest pain.  07/20/17  Yes Paul, Nadean Corwin, MD  NON FORMULARY at bedtime. CPAP   Yes [provider]  nystatin (MYCOSTATIN/NYSTOP) powder Apply 1 application topically 2 (two) times daily as needed for rash. 08/06/17  Yes [provider]  pantoprazole (PROTONIX) 40 MG tablet TAKE 1 TABLET BY MOUTH EVERY DAY Patient taking differently: Take 40 mg by mouth daily.  12/25/16  Yes Paul, Nadean Corwin, MD  potassium chloride (K-DUR,KLOR-CON) 10 MEQ tablet TAKE 1 TABLET BY MOUTH DAILY Patient taking differently: Take 10 mEq by mouth daily.  09/25/16  Yes Paul, Nadean Corwin, MD  rivaroxaban (XARELTO) 20 MG TABS tablet Take 1 tablet (20  mg total) daily with supper by mouth. 11/24/16  Yes Paul, Nadean Corwin, MD  sildenafil (VIAGRA) 50 MG tablet Take 50 mg by mouth as needed for erectile dysfunction.  06/21/17  Yes [provider]  oxyCODONE (OXY IR/ROXICODONE) 5 MG immediate release tablet Take 1 tablet (5 mg total) by mouth every 4 (four) hours as needed for moderate pain. Patient not taking: Reported on 08/20/2017 04/09/17   Ulyses Amor, PA-C    Allergies: No Known Allergies  Social History   Socioeconomic History  . Marital status: Married    Spouse name: Not on file  . Number of children: Not on file  . Years of education: Not on file  . Highest education level: Not on file  Occupational History  . Not on file  Social Needs  . Financial resource strain: Not on file  . Food insecurity:    Worry: Not on file     Inability: Not on file  . Transportation needs:    Medical: Not on file    Non-medical: Not on file  Tobacco Use  . Smoking status: Former Smoker    Packs/day: 0.50    Years: 35.00    Pack years: 17.50    Types: Cigarettes    Last attempt to quit: 02/12/2013    Years since quitting: 4.5  . Smokeless tobacco: Never Used  Substance and Sexual Activity  . Alcohol use: No    Comment: 07/21/2014 "never drank much; might have a couple drinks/yr since MI 02/2013"  . Drug use: No  . Sexual activity: Yes    Birth control/protection: None  Lifestyle  . Physical activity:    Days per week: Not on file    Minutes per session: Not on file  . Stress: Not on file  Relationships  . Social connections:    Talks on phone: Not on file    Gets together: Not on file    Attends religious service: Not on file    Active member of club or organization: Not on file    Attends meetings of clubs or organizations: Not on file    Relationship status: Not on file  . Intimate partner violence:    Fear of current or ex partner: Not on file    Emotionally abused: Not on file    Physically abused: Not on file    Forced sexual activity: Not on file  Other Topics Concern  . Not on file  Social History Narrative  . Not on file     Family History  Problem Relation Age of Onset  . Heart disease Mother   . Diabetes Mother   . Stroke Mother   . Diabetes Father     Review of Systems: All other systems reviewed and are otherwise negative except as noted above.  Labs:   Lab Results  Component Value Date   WBC 11.7 (H) 08/20/2017   HGB 14.3 08/21/2017   HCT 42.0 08/21/2017   MCV 92.1 08/20/2017   PLT 206 08/20/2017    Recent Labs  Lab 08/20/17 1824 08/21/17 0011  NA 141 143  K 3.7 3.7  CL 110 108  CO2 22  --   BUN 17 19  CREATININE 1.00 0.90  CALCIUM 9.0  --   GLUCOSE 109* 113*   No results for input(s): CKTOTAL, CKMB, TROPONINI in the last 72 hours. Lab Results  Component Value Date    CHOL 195 01/28/2013   HDL 43 01/28/2013   LDLCALC 111 (H)  01/28/2013   TRIG 206 (H) 01/28/2013   No results found for: DDIMER  Radiology/Studies:  Dg Chest 2 View  Result Date: 08/20/2017 CLINICAL DATA:  Central chest pain EXAM: CHEST - 2 VIEW COMPARISON:  05/09/2016 FINDINGS: Heart and mediastinal contours are within normal limits. No focal opacities or effusions. No acute bony abnormality. IMPRESSION: No active cardiopulmonary disease. Electronically Signed   By: Rolm Baptise M.D.   On: 08/20/2017 19:35   Wt Readings from Last 3 Encounters:  07/08/17 119.7 kg  04/22/17 120.2 kg  04/07/17 120 kg    EKG: Normal sinus rhythm, no acute ischemic changes, unchanged from priors.  Physical Exam: Blood pressure 126/65, pulse (!) 53, temperature 98.5 F (36.9 C), temperature source Oral, resp. rate 18, SpO2 99 %. There is no height or weight on file to calculate BMI. General: Well developed, well nourished, in no acute distress. Head: Normocephalic, atraumatic, sclera non-icteric, no xanthomas, nares are without discharge.  Neck: Negative for carotid bruits. JVD not elevated. Lungs: Clear bilaterally to auscultation without wheezes, rales, or rhonchi. Breathing is unlabored. Heart: RRR with S1 S2. No murmurs, rubs, or gallops appreciated. Abdomen: Soft, non-tender, non-distended with normoactive bowel sounds. No hepatomegaly. No rebound/guarding. No obvious abdominal masses. Msk:  Strength and tone appear normal for age. Extremities: No clubbing or cyanosis. No edema.  Distal pedal pulses are 2+ and equal bilaterally. Neuro: Alert and oriented X 3. No focal deficit. No facial asymmetry. Moves all extremities spontaneously. Psych:  Responds to questions appropriately with a normal affect.    Assessment and Plan  57 year old man with known coronary disease and peripheral vascular disease, who presents with an episode of chest discomfort concerning for unstable angina.  1.  Chest pain:  Symptoms reminiscent to prior MI.  Reassuringly, the patient's cardiac biomarkers have been negative and his EKG is unchanged.  It is worth noting that the patient had a relatively reassuring stress nuclear study immediately prior to his presentation with his MI in 2015.  As such, would favor invasive evaluation upfront.  His last dose of Xarelto was on the evening of 8/14, so we will continue to hold for now in anticipation of possible cath this later this morning.  We will initiate a heparin drip continue low-dose aspirin.  2.  Atrial fibrillation: Continue home Tikosyn, Xarelto on hold as above.  Heparin drip as above.  3.  Prior PE: Holding home Xarelto in anticipation of cath, will start heparin drip.  4.  PAD: Doing well after left femoropopliteal bypass.  5.  Hyperlipidemia: Continue home statin dosing.  Signed, Doylene Canning, MD 08/21/2017, 2:21 AM

## 2017-08-21 NOTE — ED Provider Notes (Signed)
[place] EMERGENCY DEPARTMENT Provider Note   CSN: [id number] Arrival date & time: 08/20/17  1756     History   Chief Complaint Chief Complaint  Patient presents with  . Chest Pain    HPI Paul Todd is a 57 y.o. male.  57 yo M with a cc of chest pain.  This started when the patient was power washing the house.  Describes it as a pressure to his chest with radiation to the jaw.  Gets short of breath and was already sweaty so not sure if not got worse.  It felt like the last time that he had to have a stent placed.  He denies cough congestion or fever denied vomiting or nausea.  Denies abdominal pain.  Denies worsening lower extremity edema.  Has a history of PE is on Xarelto.  Denies any missed doses.  The history is provided by the patient.  Chest Pain   This is a new problem. The current episode started less than 1 hour ago. The problem occurs constantly. The problem has not changed since onset.Associated symptoms include diaphoresis and shortness of breath. Pertinent negatives include no abdominal pain, no fever, no headaches, no palpitations and no vomiting.    Past Medical History:  Diagnosis Date  . Arthritis       . CAD (coronary artery disease)    multivessel per cath 02/12/13, s/p stenting to RCA  . Chronic lower back pain   . Dyslipidemia   . Hx of Bell's palsy   . Hypertension   . Myocardial infarction ([place]) 02/12/2013   . Nephrolithiasis   . OSA on CPAP   . PE (pulmonary thromboembolism) ([place]) 02/2014  . Persistent atrial fibrillation [facility])         Patient Active Problem List   Diagnosis Date Noted  . PAD (peripheral artery disease) ([place]) 04/07/2017  . Ischemic cardiomyopathy 11/25/2016  . Mixed hyperlipidemia 11/25/2016  . Pre-operative cardiovascular examination 08/23/2015  . PVC (premature ventricular contraction) 07/24/2014  . Encounter for cardioversion procedure   . Persistent atrial fibrillation ([place])   . Pulmonary embolus  ([place]) 06/20/2014  . Back pain 02/21/2014  . Chest pain 09/05/2013  . Nephrolithiasis 09/05/2013  . Hematuria, gross 09/05/2013  . Nausea and vomiting 09/05/2013  . Nausea & vomiting   . Old myocardial infarction   . Emesis 09/02/2013  . OSA (obstructive sleep apnea) 07/22/2013  . Obesity (BMI 35.0-39.9 without comorbidity) 03/07/2013  . Unstable angina - Post Infarct Angina with existing LAD disease 02/14/2013  . CAD S/P percutaneous coronary angioplasty - DES to Mid LAD; 2x 83mm x 38 mm Xience DES to RCA 02/14/2013  . Dyslipidemia 02/12/2013  . NSTEMI (non-ST elevated myocardial infarction) ([place]) 01/30/2013  . Tobacco abuse 01/30/2013  . Fatigue 01/30/2013  . PAF (paroxysmal atrial fibrillation) ([place]) 01/30/2013    Past Surgical History:  Procedure Laterality Date  . ABDOMINAL AORTOGRAM W/LOWER EXTREMITY N/A 01/26/2017   Procedure: ABDOMINAL AORTOGRAM W/LOWER EXTREMITY;  Surgeon: Angelia Mould, MD;  Location: [place] CV LAB;  Service: Cardiovascular;  Laterality: N/A;  . CARDIOVERSION N/A 02/15/2013   Procedure: CARDIOVERSION;  Surgeon: Thayer Headings, MD;  Location: [place];  Service: Cardiovascular;  Laterality: N/A;  . CARDIOVERSION N/A 07/07/2014   Procedure: CARDIOVERSION;  Surgeon: Pixie Casino, MD;  Location: [place];  Service: Cardiovascular;  Laterality: N/A;  . CORONARY ANGIOPLASTY WITH STENT PLACEMENT  02/12/2013  . CYSTOSCOPY WITH RETROGRADE PYELOGRAM, URETEROSCOPY AND STENT PLACEMENT Left  09/02/2013   Procedure: CYSTOSCOPY WITH RETROGRADE PYELOGRAM/LEFT URETEROSCOPY/URETERAL STENT, with fulguration;  Surgeon: Festus Aloe, MD;  Location: [facility];  Service: Urology;  Laterality: Left;  . CYSTOSCOPY WITH URETEROSCOPY AND STENT PLACEMENT Left 09/16/2013   Procedure: CYSTOSCOPY WITH URETEROSCOPY AND STENT PLACEMENT;  Surgeon: Festus Aloe, MD;  Location: [facility];  Service: Urology;  Laterality: Left;  . FEMORAL BYPASS Left 04/07/2017   below the  knee popliteal  . FEMORAL-POPLITEAL BYPASS GRAFT Left 04/07/2017   Procedure: LEFT FEMORAL-BELOW KNEE POPLITEAL ARTERY BYPASS USING COMPOSITE LEFT GREATER SAPHENOUS VEIN;  Surgeon: Angelia Mould, MD;  Location: [place];  Service: Vascular;  Laterality: Left;  . HOLMIUM LASER APPLICATION Left 04/24/3788   Procedure: HOLMIUM LASER APPLICATION;  Surgeon: Festus Aloe, MD;  Location: [facility];  Service: Urology;  Laterality: Left;  . LEFT HEART CATHETERIZATION WITH CORONARY ANGIOGRAM N/A 02/12/2013   Procedure: LEFT HEART CATHETERIZATION WITH CORONARY ANGIOGRAM;  Surgeon: Troy Sine, MD;  Location[facility] C[facility];  Service: Cardiovascular;  Laterality: N/A;  . PERCUTANEOUS CORONARY STENT INTERVENTION (PCI-S)  02/12/2013   Procedure: PERCUTANEOUS CORONARY STENT INTERVENTION (PCI-S);  Surgeon: Troy Sine, MD;  Location: [facility] CATH LAB;  Service: Cardiovascular;;  STEMI  . PERCUTANEOUS CORONARY STENT INTERVENTION (PCI-S) N/A 02/14/2013   Procedure: PERCUTANEOUS CORONARY STENT INTERVENTION (PCI-S);  Surgeon: Burnell Blanks, MD;  Location: [facility] CATH LAB;  Service: Cardiovascular;  Laterality: N/A;  Proximal LAD  . TEE WITHOUT CARDIOVERSION N/A 02/15/2013   Procedure: TRANSESOPHAGEAL ECHOCARDIOGRAM (TEE);  Surgeon: Thayer Headings, MD;  Location: [place];  Service: Cardiovascular;  Laterality: N/A;     OB History   None      Home Medications    Prior to Admission medications   Medication Sig Start Date End Date Taking? Authorizing Provider  acetaminophen (TYLENOL) 650 MG CR tablet Take 650 mg by mouth every 8 (eight) hours as needed for pain.   Yes [provider]  albuterol (PROVENTIL HFA;VENTOLIN HFA) 108 (90 Base) MCG/ACT inhaler Inhale 1-2 puffs into the lungs every 4 (four) hours as needed for wheezing or shortness of breath.  08/17/17  Yes [provider]  aspirin EC 81 MG tablet Take 81 mg by mouth daily.   Yes [provider]  atorvastatin (LIPITOR) 80  MG tablet TAKE 1 TABLET BY MOUTH EVERY DAY AT 6PM Patient taking differently: Take 80 mg by mouth daily at 6 PM.  05/25/17  Yes Hilty, Nadean Corwin, MD  docusate sodium (COLACE) 100 MG capsule Take 100 mg by mouth 2 (two) times daily. 01/08/17  Yes [provider]  dofetilide (TIKOSYN) 250 MCG capsule Take 1 capsule (250 mcg total) 2 (two) times daily by mouth. 11/24/16  Yes Hilty, Nadean Corwin, MD  DULoxetine (CYMBALTA) 60 MG capsule Take 60 mg by mouth every evening.   Yes [provider]  JARDIANCE 10 MG TABS tablet Take 10 mg by mouth daily. 08/10/17  Yes [provider]  magnesium oxide (MAG-OX) 400 (241.3 MG) MG tablet Take 1 tablet (400 mg total) by mouth daily. 07/25/14  Yes Seiler, Amber K, NP  metoprolol succinate (TOPROL-XL) 50 MG 24 hr tablet TAKE 1 TABLET (50 MG TOTAL) BY MOUTH DAILY. TAKE WITH OR IMMEDIATELY FOLLOWING A MEAL. 03/16/17  Yes Hilty, Nadean Corwin, MD  Multiple Vitamin (MULTIVITAMIN WITH MINERALS) TABS tablet Take 1 tablet by mouth daily.    Yes [provider]  nitroGLYCERIN (NITROSTAT) 0.4 MG SL tablet PLACE 1 TABLET UNDER TONGUE EVERY 5  MINUTES AS NEEDED FOR CHEST PAIN MAX 3 TABLETS Patient taking differently: Place 0.4 mg under the tongue every 5 (five) minutes as needed for chest pain.  07/20/17  Yes Hilty, Nadean Corwin, MD  NON FORMULARY at bedtime. CPAP   Yes [provider]  nystatin (MYCOSTATIN/NYSTOP) powder Apply 1 application topically 2 (two) times daily as needed for rash. 08/06/17  Yes [provider]  pantoprazole (PROTONIX) 40 MG tablet TAKE 1 TABLET BY MOUTH EVERY DAY Patient taking differently: Take 40 mg by mouth daily.  12/25/16  Yes Hilty, Nadean Corwin, MD  potassium chloride (K-DUR,KLOR-CON) 10 MEQ tablet TAKE 1 TABLET BY MOUTH DAILY Patient taking differently: Take 10 mEq by mouth daily.  09/25/16  Yes Hilty, Nadean Corwin, MD  rivaroxaban (XARELTO) 20 MG TABS tablet Take 1 tablet (20 mg total) daily with supper by mouth.  11/24/16  Yes Hilty, Nadean Corwin, MD  sildenafil (VIAGRA) 50 MG tablet Take 50 mg by mouth as needed for erectile dysfunction.  06/21/17  Yes [provider]  oxyCODONE (OXY IR/ROXICODONE) 5 MG immediate release tablet Take 1 tablet (5 mg total) by mouth every 4 (four) hours as needed for moderate pain. Patient not taking: Reported on 08/20/2017 04/09/17   Ulyses Amor, PA-C    Family History Family History  Problem Relation Age of Onset  . Heart disease Mother   . Diabetes Mother   . Stroke Mother   . Diabetes Father     Social History Social History   Tobacco Use  . Smoking status: Former Smoker    Packs/day: 0.50    Years: 35.00    Pack years: 17.50    Types: Cigarettes    Last attempt to quit: 02/12/2013    Years since quitting: 4.5  . Smokeless tobacco: Never Used  Substance Use Topics  . Alcohol use: No    Comment: 07/21/2014 "never drank much; might have a couple drinks/yr since MI 02/2013"  . Drug use: No     Allergies   Patient has no known allergies.   Review of Systems Review of Systems  Constitutional: Positive for diaphoresis. Negative for chills and fever.  HENT: Negative for congestion and facial swelling.   Eyes: Negative for discharge and visual disturbance.  Respiratory: Positive for shortness of breath.   Cardiovascular: Positive for chest pain. Negative for palpitations.  Gastrointestinal: Negative for abdominal pain, diarrhea and vomiting.  Musculoskeletal: Negative for arthralgias and myalgias.  Skin: Negative for color change and rash.  Neurological: Negative for tremors, syncope and headaches.  Psychiatric/Behavioral: Negative for confusion and dysphoric mood.     Physical Exam Updated Vital Signs BP 123/82   Pulse (!) 52   Temp 98.5 F (36.9 C) (Oral)   Resp 17   SpO2 93%   Physical Exam  Constitutional: He is oriented to person, place, and time. He appears well-developed and well-nourished.  HENT:  Head: Normocephalic and  atraumatic.  Eyes: Pupils are equal, round, and reactive to light. EOM are normal.  Neck: Normal range of motion. Neck supple. No JVD present.  Cardiovascular: Normal rate and regular rhythm. Exam reveals no gallop and no friction rub.  No murmur heard. Pulmonary/Chest: No respiratory distress. He has no wheezes.  Abdominal: He exhibits no distension. There is no rebound and no guarding.  Musculoskeletal: Normal range of motion. He exhibits edema (3+ to the LLE, 2+ to the RLE).       Right lower leg: He exhibits edema.  Left lower leg: He exhibits edema.  Neurological: He is alert and oriented to person, place, and time.  Skin: No rash noted. No pallor.  Psychiatric: He has a normal mood and affect. His behavior is normal.  Nursing note and vitals reviewed.    ED Treatments / Results  Labs (all labs ordered are listed, but only abnormal results are displayed) Labs Reviewed  BASIC METABOLIC PANEL - Abnormal; Notable for the following components:      Result Value   Glucose, Bld 109 (*)    All other components within normal limits  CBC - Abnormal; Notable for the following components:   WBC 11.7 (*)    All other components within normal limits  I-STAT CHEM 8, ED - Abnormal; Notable for the following components:   Glucose, Bld 113 (*)    Calcium, Ion 1.12 (*)    All other components within normal limits  I-STAT TROPONIN, ED  I-STAT TROPONIN, ED    EKG EKG Interpretation  Date/Time:  Thursday August 20 2017 18:03:13 EDT Ventricular Rate:  60 PR Interval:  186 QRS Duration: 86 QT Interval:  394 QTC Calculation: 394 R Axis:   67 Text Interpretation:  Normal sinus rhythm Normal ECG No significant change since last tracing Confirmed by Deno Etienne [phone]) on 08/20/2017 11:51:21 PM   Radiology Dg Chest 2 View  Result Date: 08/20/2017 CLINICAL DATA:  Central chest pain EXAM: CHEST - 2 VIEW COMPARISON:  05/09/2016 FINDINGS: Heart and mediastinal contours are within normal  limits. No focal opacities or effusions. No acute bony abnormality. IMPRESSION: No active cardiopulmonary disease. Electronically Signed   By: Rolm Baptise M.D.   On: 08/20/2017 19:35    Procedures Procedures (including critical care time)  Medications Ordered in ED Medications  aspirin chewable tablet 162 mg (162 mg Oral Given 08/21/17 0030)     Initial Impression / Assessment and Plan / ED Course  I have reviewed the triage vital signs and the nursing notes.  Pertinent labs & imaging results that were available during my care of the patient were reviewed by me and considered in my medical decision making (see chart for details).     57 yo M with a significant past medical history of coronary artery disease comes in with a chief complaint of chest pain.  States this feels like his prior chest pain.  Was exertional.  Was very short limited and resolved after he took a baby aspirin.  We will give 2 more baby aspirin here.  Will discuss with cardiology.  ECG without significant change troponin negative metabolic panel unremarkable.  Not anemic.  Chest x-ray without focal infiltrate.  Discussed with cards, will admit.   The patients results and plan were reviewed and discussed.   Any x-rays performed were independently reviewed by myself.   Differential diagnosis were considered with the presenting HPI.  Medications  aspirin chewable tablet 162 mg (162 mg Oral Given 08/21/17 0030)    Vitals:   08/20/17 2214 08/20/17 2340 08/21/17 0045 08/21/17 0115  BP: 125/63 (!) 150/74 130/67 123/82  Pulse: 62 (!) 54 (!) 49 (!) 52  Resp: 16 18 18 17   Temp: 98.5 F (36.9 C)     TempSrc: Oral     SpO2: 97% 99% 98% 93%    Final diagnoses:  Chest pain with high risk for cardiac etiology    Admission/ observation were discussed with the admitting physician, patient and/or family and they are comfortable with the plan.  Final Clinical Impressions(s) / ED Diagnoses   Final diagnoses:    Chest pain with high risk for cardiac etiology    ED Discharge Orders    None       Deno Etienne, DO 08/21/17 0134

## 2017-08-22 ENCOUNTER — Observation Stay (HOSPITAL_COMMUNITY): Payer: BLUE CROSS/BLUE SHIELD

## 2017-08-22 DIAGNOSIS — G4733 Obstructive sleep apnea (adult) (pediatric): Secondary | ICD-10-CM | POA: Diagnosis present

## 2017-08-22 DIAGNOSIS — I25110 Atherosclerotic heart disease of native coronary artery with unstable angina pectoris: Secondary | ICD-10-CM | POA: Diagnosis present

## 2017-08-22 DIAGNOSIS — I4891 Unspecified atrial fibrillation: Secondary | ICD-10-CM | POA: Diagnosis not present

## 2017-08-22 DIAGNOSIS — M545 Low back pain: Secondary | ICD-10-CM | POA: Diagnosis present

## 2017-08-22 DIAGNOSIS — Z01810 Encounter for preprocedural cardiovascular examination: Secondary | ICD-10-CM | POA: Diagnosis not present

## 2017-08-22 DIAGNOSIS — Z951 Presence of aortocoronary bypass graft: Secondary | ICD-10-CM | POA: Diagnosis not present

## 2017-08-22 DIAGNOSIS — E8770 Fluid overload, unspecified: Secondary | ICD-10-CM | POA: Diagnosis not present

## 2017-08-22 DIAGNOSIS — I4892 Unspecified atrial flutter: Secondary | ICD-10-CM | POA: Diagnosis not present

## 2017-08-22 DIAGNOSIS — Z6841 Body Mass Index (BMI) 40.0 and over, adult: Secondary | ICD-10-CM | POA: Diagnosis not present

## 2017-08-22 DIAGNOSIS — Z86718 Personal history of other venous thrombosis and embolism: Secondary | ICD-10-CM | POA: Diagnosis not present

## 2017-08-22 DIAGNOSIS — R079 Chest pain, unspecified: Secondary | ICD-10-CM | POA: Diagnosis not present

## 2017-08-22 DIAGNOSIS — Z87442 Personal history of urinary calculi: Secondary | ICD-10-CM | POA: Diagnosis not present

## 2017-08-22 DIAGNOSIS — I252 Old myocardial infarction: Secondary | ICD-10-CM | POA: Diagnosis not present

## 2017-08-22 DIAGNOSIS — I493 Ventricular premature depolarization: Secondary | ICD-10-CM | POA: Diagnosis not present

## 2017-08-22 DIAGNOSIS — D62 Acute posthemorrhagic anemia: Secondary | ICD-10-CM | POA: Diagnosis not present

## 2017-08-22 DIAGNOSIS — I10 Essential (primary) hypertension: Secondary | ICD-10-CM | POA: Diagnosis present

## 2017-08-22 DIAGNOSIS — Z955 Presence of coronary angioplasty implant and graft: Secondary | ICD-10-CM | POA: Diagnosis not present

## 2017-08-22 DIAGNOSIS — E8881 Metabolic syndrome: Secondary | ICD-10-CM | POA: Diagnosis present

## 2017-08-22 DIAGNOSIS — J9811 Atelectasis: Secondary | ICD-10-CM | POA: Diagnosis not present

## 2017-08-22 DIAGNOSIS — E785 Hyperlipidemia, unspecified: Secondary | ICD-10-CM | POA: Diagnosis present

## 2017-08-22 DIAGNOSIS — Z87891 Personal history of nicotine dependence: Secondary | ICD-10-CM | POA: Diagnosis not present

## 2017-08-22 DIAGNOSIS — M1990 Unspecified osteoarthritis, unspecified site: Secondary | ICD-10-CM | POA: Diagnosis present

## 2017-08-22 DIAGNOSIS — Z5181 Encounter for therapeutic drug level monitoring: Secondary | ICD-10-CM | POA: Diagnosis not present

## 2017-08-22 DIAGNOSIS — I480 Paroxysmal atrial fibrillation: Secondary | ICD-10-CM | POA: Diagnosis not present

## 2017-08-22 DIAGNOSIS — I481 Persistent atrial fibrillation: Secondary | ICD-10-CM | POA: Diagnosis present

## 2017-08-22 DIAGNOSIS — G8929 Other chronic pain: Secondary | ICD-10-CM | POA: Diagnosis present

## 2017-08-22 DIAGNOSIS — E1151 Type 2 diabetes mellitus with diabetic peripheral angiopathy without gangrene: Secondary | ICD-10-CM | POA: Diagnosis present

## 2017-08-22 DIAGNOSIS — Z86711 Personal history of pulmonary embolism: Secondary | ICD-10-CM | POA: Diagnosis not present

## 2017-08-22 LAB — URINALYSIS, ROUTINE W REFLEX MICROSCOPIC
Bacteria, UA: NONE SEEN
Bilirubin Urine: NEGATIVE
Glucose, UA: >=500 mg/dL — AB
Hgb urine dipstick: NEGATIVE
Ketones, ur: NEGATIVE mg/dL
Leukocytes, UA: NEGATIVE
Nitrite: NEGATIVE
Protein, ur: 30 mg/dL — AB
Specific Gravity, Urine: 1.032 — ABNORMAL HIGH (ref 1.005–1.030)
pH: 5 (ref 5.0–8.0)

## 2017-08-22 LAB — HEPARIN LEVEL (UNFRACTIONATED)
Heparin Unfractionated: 0.26 IU/mL — ABNORMAL LOW (ref 0.30–0.70)
Heparin Unfractionated: 0.37 IU/mL (ref 0.30–0.70)

## 2017-08-22 LAB — COMPREHENSIVE METABOLIC PANEL
ALT: 41 U/L (ref 0–44)
AST: 29 U/L (ref 15–41)
Albumin: 3.6 g/dL (ref 3.5–5.0)
Alkaline Phosphatase: 79 U/L (ref 38–126)
Anion gap: 6 (ref 5–15)
BUN: 12 mg/dL (ref 6–20)
CO2: 23 mmol/L (ref 22–32)
Calcium: 8.4 mg/dL — ABNORMAL LOW (ref 8.9–10.3)
Chloride: 112 mmol/L — ABNORMAL HIGH (ref 98–111)
Creatinine, Ser: 0.86 mg/dL (ref 0.61–1.24)
GFR calc Af Amer: >60 mL/min (ref >60)
GFR calc non Af Amer: >60 mL/min (ref >60)
Glucose, Bld: 123 mg/dL — ABNORMAL HIGH (ref 70–99)
Potassium: 4 mmol/L (ref 3.5–5.1)
Sodium: 141 mmol/L (ref 135–145)
Total Bilirubin: 0.8 mg/dL (ref 0.3–1.2)
Total Protein: 6.8 g/dL (ref 6.5–8.1)

## 2017-08-22 LAB — SURGICAL PCR SCREEN
MRSA, PCR: NEGATIVE
Staphylococcus aureus: NEGATIVE

## 2017-08-22 LAB — HEMOGLOBIN A1C
Hgb A1c MFr Bld: 7.4 % — ABNORMAL HIGH (ref 4.8–5.6)
Mean Plasma Glucose: 165.68 mg/dL

## 2017-08-22 LAB — APTT: aPTT: 42 seconds — ABNORMAL HIGH (ref 24–36)

## 2017-08-22 LAB — BLOOD GAS, ARTERIAL
Acid-base deficit: 2.3 mmol/L — ABNORMAL HIGH (ref 0.0–2.0)
Bicarbonate: 21.8 mmol/L (ref 20.0–28.0)
Drawn by: 123381
FIO2: 21
O2 Saturation: 94.3 %
Patient temperature: 98.6
pCO2 arterial: 36.1 mmHg (ref 32.0–48.0)
pH, Arterial: 7.398 (ref 7.350–7.450)
pO2, Arterial: 90.4 mmHg (ref 83.0–108.0)

## 2017-08-22 LAB — MAGNESIUM: MAGNESIUM: 1.9 mg/dL (ref 1.7–2.4)

## 2017-08-22 LAB — CBC
HCT: 43.2 % (ref 39.0–52.0)
Hemoglobin: 13.8 g/dL (ref 13.0–17.0)
MCH: 29.5 pg (ref 26.0–34.0)
MCHC: 31.9 g/dL (ref 30.0–36.0)
MCV: 92.3 fL (ref 78.0–100.0)
Platelets: 191 10*3/uL (ref 150–400)
RBC: 4.68 MIL/uL (ref 4.22–5.81)
RDW: 14.6 % (ref 11.5–15.5)
WBC: 9.7 10*3/uL (ref 4.0–10.5)

## 2017-08-22 LAB — PROTIME-INR
INR: 1.01
Prothrombin Time: 13.2 seconds (ref 11.4–15.2)

## 2017-08-22 LAB — TSH: TSH: 0.819 u[IU]/mL (ref 0.350–4.500)

## 2017-08-22 MED ORDER — POTASSIUM CHLORIDE 2 MEQ/ML IV SOLN
80.0000 meq | INTRAVENOUS | Status: DC
  Filled 2017-08-22: qty 40

## 2017-08-22 MED ORDER — NITROGLYCERIN IN D5W 200-5 MCG/ML-% IV SOLN
2.0000 ug/min | INTRAVENOUS | Status: AC
  Administered 2017-08-24: 10 ug/min via INTRAVENOUS
  Filled 2017-08-22: qty 250

## 2017-08-22 MED ORDER — VANCOMYCIN HCL 10 G IV SOLR
1500.0000 mg | INTRAVENOUS | Status: AC
  Administered 2017-08-24: 1500 mg via INTRAVENOUS
  Filled 2017-08-22: qty 1500

## 2017-08-22 MED ORDER — SODIUM CHLORIDE 0.9 % IV SOLN
INTRAVENOUS | Status: DC
  Filled 2017-08-22: qty 30

## 2017-08-22 MED ORDER — TRANEXAMIC ACID 1000 MG/10ML IV SOLN
1.5000 mg/kg/h | INTRAVENOUS | Status: AC
  Administered 2017-08-24: 1.5 mg/kg/h via INTRAVENOUS
  Filled 2017-08-22: qty 25

## 2017-08-22 MED ORDER — SODIUM CHLORIDE 0.9 % IV SOLN
1.5000 g | INTRAVENOUS | Status: AC
  Administered 2017-08-24: 1.5 g via INTRAVENOUS
  Filled 2017-08-22: qty 1.5

## 2017-08-22 MED ORDER — MAGNESIUM SULFATE 50 % IJ SOLN
40.0000 meq | INTRAMUSCULAR | Status: DC
  Filled 2017-08-22: qty 9.85

## 2017-08-22 MED ORDER — DEXMEDETOMIDINE HCL IN NACL 400 MCG/100ML IV SOLN
0.1000 ug/kg/h | INTRAVENOUS | Status: AC
  Administered 2017-08-24: 0.7 ug/kg/h via INTRAVENOUS
  Filled 2017-08-22: qty 100

## 2017-08-22 MED ORDER — SODIUM CHLORIDE 0.9 % IV SOLN
30.0000 ug/min | INTRAVENOUS | Status: AC
  Administered 2017-08-24: 15 ug/min via INTRAVENOUS
  Filled 2017-08-22: qty 2

## 2017-08-22 MED ORDER — PLASMA-LYTE 148 IV SOLN
INTRAVENOUS | Status: AC
  Administered 2017-08-24: 500 mL
  Filled 2017-08-22: qty 2.5

## 2017-08-22 MED ORDER — SODIUM CHLORIDE 0.9 % IV SOLN
750.0000 mg | INTRAVENOUS | Status: DC
  Filled 2017-08-22: qty 750

## 2017-08-22 MED ORDER — SODIUM CHLORIDE 0.9 % IV SOLN
INTRAVENOUS | Status: AC
  Administered 2017-08-24: 1 [IU]/h via INTRAVENOUS
  Filled 2017-08-22: qty 1

## 2017-08-22 MED ORDER — EPINEPHRINE PF 1 MG/ML IJ SOLN
0.0000 ug/min | INTRAVENOUS | Status: DC
  Filled 2017-08-22: qty 4

## 2017-08-22 MED ORDER — TRANEXAMIC ACID (OHS) PUMP PRIME SOLUTION
2.0000 mg/kg | INTRAVENOUS | Status: DC
  Filled 2017-08-22: qty 2.35

## 2017-08-22 MED ORDER — TRANEXAMIC ACID (OHS) BOLUS VIA INFUSION
15.0000 mg/kg | INTRAVENOUS | Status: AC
Start: 2017-08-24 — End: 2017-08-24
  Administered 2017-08-24: 1762.5 mg via INTRAVENOUS
  Filled 2017-08-22: qty 1763

## 2017-08-22 MED ORDER — DOPAMINE-DEXTROSE 3.2-5 MG/ML-% IV SOLN
0.0000 ug/kg/min | INTRAVENOUS | Status: DC
  Filled 2017-08-22: qty 250

## 2017-08-22 MED ORDER — MILRINONE LACTATE IN DEXTROSE 20-5 MG/100ML-% IV SOLN
0.1250 ug/kg/min | INTRAVENOUS | Status: AC
  Administered 2017-08-24: 0.25 ug/kg/min via INTRAVENOUS
  Filled 2017-08-22: qty 100

## 2017-08-22 NOTE — Progress Notes (Addendum)
Pre-op Cardiac Surgery  Carotid Findings:  Findings suggest 1-39% internal carotid artery stenosis bilaterally. Left vertebral artery is patent with antegrade flow. Unable to visualize the right vertebral artery.  Upper Extremity Right Left  Brachial Pressures 142-Triphasic 131-Triphasic  Radial Waveforms Triphasic Triphasic  Ulnar Waveforms Triphasic Triphasic  Palmar Arch (Allen's Test) Unable to evaluate due to recent cardiac catheterization Within normal limits.   ABI completed 07/08/17.  Right lower extremity saphenous mapping completed.   08/22/2017 12:35 PM Maudry Mayhew, MHA, RVT, RDCS, RDMS

## 2017-08-22 NOTE — Progress Notes (Signed)
ANTICOAGULATION CONSULT NOTE Pharmacy Consult for Heparin Indication: H/O PE and PAF  No Known Allergies  Patient Measurements: Weight: 259 lb (117.5 kg) Heparin Dosing Weight: 95 kg  Vital Signs: Temp: 97.8 F (36.6 C) (08/17 0354) Temp Source: Oral (08/17 0354) BP: 121/58 (08/17 0354) Pulse Rate: 55 (08/17 0354)  Labs: Recent Labs    08/20/17 1824 08/21/17 0011 08/21/17 0334 08/21/17 0342 08/22/17 0405  HGB 14.2 14.3  --   --  13.8  HCT 45.3 42.0  --   --  43.2  PLT 206  --   --   --  191  APTT  --   --  26  --  42*  LABPROT  --   --  13.3  --  13.2  INR  --   --  1.02  --  1.01  HEPARINUNFRC  --   --  0.30  --  0.26*  CREATININE 1.00 0.90  --   --  0.86  TROPONINI  --   --   --  <0.03  --     Estimated Creatinine Clearance: 116.2 mL/min (by C-G formula based on SCr of 0.86 mg/dL).  Assessment: 57 y.o. male with h/o Afib and PE and Xarelto on hold while awaiting CABG, for heparin  Goal of Therapy:  Heparin level 0.3-0.7 units/ml Monitor platelets by anticoagulation protocol: Yes   Plan:  Increase Heparin 1400 units/hr Check heparin level in 8 hours.  Caryl Pina 08/22/2017,6:28 AM

## 2017-08-22 NOTE — Progress Notes (Signed)
PT states he does not need any assistance with his home CPAP. Advised pt to have RN notify for RT if any further assistance is required. RT will continue to monitor.

## 2017-08-22 NOTE — Progress Notes (Signed)
ANTICOAGULATION CONSULT NOTE Pharmacy Consult for Heparin Indication: H/O PE and PAF  No Known Allergies  Patient Measurements: Height: 5\' 7"  (170.2 cm) Weight: 259 lb (117.5 kg) IBW/kg (Calculated) : 66.1 Heparin Dosing Weight: 95 kg  Vital Signs: Temp: 97.3 F (36.3 C) (08/17 1350) Temp Source: Oral (08/17 1350) BP: 121/61 (08/17 1350) Pulse Rate: 55 (08/17 1350)  Labs: Recent Labs    08/20/17 1824 08/21/17 0011 08/21/17 0334 08/21/17 0342 08/22/17 0405 08/22/17 1403  HGB 14.2 14.3  --   --  13.8  --   HCT 45.3 42.0  --   --  43.2  --   PLT 206  --   --   --  191  --   APTT  --   --  26  --  42*  --   LABPROT  --   --  13.3  --  13.2  --   INR  --   --  1.02  --  1.01  --   HEPARINUNFRC  --   --  0.30  --  0.26* 0.37  CREATININE 1.00 0.90  --   --  0.86  --   TROPONINI  --   --   --  <0.03  --   --     Estimated Creatinine Clearance: 116.2 mL/min (by C-G formula based on SCr of 0.86 mg/dL).  Assessment: 57 y.o. male with h/o Afib and PE and Xarelto on hold while awaiting CABG, for heparin.   Heparin level this afternoon after rate increase came back therapeutic at 0.37, on 1400 units/hr. Hgb 13.8, plt 191. No infusion issues. No s/sx of bleeding.   Goal of Therapy:  Heparin level 0.3-0.7 units/ml Monitor platelets by anticoagulation protocol: Yes   Plan:  Continue heparin at 1400 units/hr Monitor daily HL, CBC, for s/sx of bleeding  Doylene Canard, PharmD Clinical Pharmacist  Pager: [phone] Phone: [phone] 08/22/2017,2:46 PM

## 2017-08-22 NOTE — Progress Notes (Signed)
Progress Note  Patient Name: Paul Todd   Date of Encounter: 08/22/2017  Primary Cardiologist: Hilty  Subjective   No recurrent chest pain. Doing well. Anxious about the CABG but also glad to have it scheduled and looking forward to recovery after.  Inpatient Medications    Scheduled Meds: . aspirin EC  81 mg Oral Daily  . atorvastatin  80 mg Oral q1800  . docusate sodium  100 mg Oral BID  . dofetilide  250 mcg Oral BID  . DULoxetine  60 mg Oral QPM  . [START ON 08/24/2017] heparin-papaverine-plasmalyte irrigation   Irrigation To OR  . [START ON 08/24/2017] magnesium sulfate  40 mEq Other To OR  . metoprolol succinate  50 mg Oral Daily  . multivitamin with minerals  1 tablet Oral Daily  . pantoprazole  40 mg Oral Daily  . [START ON 08/24/2017] potassium chloride  80 mEq Other To OR  . sodium chloride flush  3 mL Intravenous Q12H  . [START ON 08/24/2017] tranexamic acid  15 mg/kg Intravenous To OR  . [START ON 08/24/2017] tranexamic acid  2 mg/kg Intracatheter To OR   Continuous Infusions: . sodium chloride    . [START ON 08/24/2017] cefUROXime (ZINACEF)  IV    . [START ON 08/24/2017] cefUROXime (ZINACEF)  IV    . [START ON 08/24/2017] dexmedetomidine    . [START ON 08/24/2017] DOPamine    . [START ON 08/24/2017] epinephrine    . [START ON 08/24/2017] heparin 30,000 units/NS 1000 mL solution for CELLSAVER    . heparin 1,400 Units/hr (08/22/17 1334)  . [START ON 08/24/2017] insulin (NOVOLIN-R) infusion    . [START ON 08/24/2017] milrinone    . [START ON 08/24/2017] nitroGLYCERIN    . [START ON 08/24/2017] phenylephrine 20mg /273mL NS (0.08mg /ml) infusion    . [START ON 08/24/2017] tranexamic acid (CYKLOKAPRON) infusion ([facility])    . [START ON 08/24/2017] vancomycin     PRN Meds: sodium chloride, acetaminophen, albuterol, ondansetron (ZOFRAN) IV, sodium chloride flush   Vital Signs    Vitals:   08/22/17 0011 08/22/17 0354 08/22/17 0722 08/22/17 0938  BP: (!) 125/57 (!) 121/58  131/64   Pulse: (!) 54 (!) 55  62  Resp: 16 20 18    Temp: 97.9 F (36.6 C) 97.8 F (36.6 C)  98.3 F (36.8 C)  TempSrc: Oral Oral  Oral  SpO2: 100% 93%  98%  Weight:  117.5 kg    Height:   5\' 7"  (1.702 m)     Intake/Output Summary (Last 24 hours) at 08/22/2017 1337 Last data filed at 08/22/2017 1610 Gross per 24 hour  Intake 1920.06 ml  Output -  Net 1920.06 ml    I/O since admission:  +120  Filed Weights   08/22/17 0354  Weight: 117.5 kg    Telemetry    NSR - Personally Reviewed  ECG    ECG no new  Physical Exam   BP 131/64 (BP Location: Right Arm)   Pulse 62   Temp 98.3 F (36.8 C) (Oral)   Resp 18   Ht 5\' 7"  (1.702 m)   Wt 117.5 kg   SpO2 98%   BMI 40.57 kg/m  General: Alert, oriented, no distress.  Skin: normal turgor, no rashes, warm and dry HEENT: Normocephalic, atraumatic. sclera anicteric; extraocular muscles intact; Mouth/Parynx benign Neck: No JVD, no carotid bruits; normal carotid upstroke Lungs: clear to ausculatation and percussion; no wheezing or rales Chest wall: without tenderness to palpitation Heart: PMI not  displaced, RRR, s1 s2 normal, 1/6 systolic murmur, no diastolic murmur, no rubs, gallops, thrills, or heaves Abdomen: soft, nontender; BS+ Pulses 2+ bilateral radial and DP Musculoskeletal: full range of motion, normal strength, no joint deformities Extremities: no clubbing cyanosis or edema Neurologic: grossly nonfocal; Cranial nerves grossly wnl Psychologic: Normal mood and affect   Labs    Chemistry Recent Labs  Lab 08/20/17 1824 08/21/17 0011 08/22/17 0405  NA 141 143 141  K 3.7 3.7 4.0  CL 110 108 112*  CO2 22  --  23  GLUCOSE 109* 113* 123*  BUN 17 19 12   CREATININE 1.00 0.90 0.86  CALCIUM 9.0  --  8.4*  PROT  --   --  6.8  ALBUMIN  --   --  3.6  AST  --   --  29  ALT  --   --  41  ALKPHOS  --   --  79  BILITOT  --   --  0.8  GFRNONAA >60  --  >60  GFRAA >60  --  >60  ANIONGAP 9  --  6      Hematology Recent Labs  Lab 08/20/17 1824 08/21/17 0011 08/22/17 0405  WBC 11.7*  --  9.7  RBC 4.92  --  4.68  HGB 14.2 14.3 13.8  HCT 45.3 42.0 43.2  MCV 92.1  --  92.3  MCH 28.9  --  29.5  MCHC 31.3  --  31.9  RDW 14.5  --  14.6  PLT 206  --  191    Cardiac Enzymes Recent Labs  Lab 08/21/17 0342  TROPONINI <0.03    Recent Labs  Lab 08/20/17 1836 08/21/17 0009  TROPIPOC 0.03 0.00     BNPNo results for input(s): BNP, PROBNP in the last 168 hours.   DDimer No results for input(s): DDIMER in the last 168 hours.   Lipid Panel     Component Value Date/Time   CHOL 195 01/28/2013 0939   TRIG 206 (H) 01/28/2013 0939   HDL 43 01/28/2013 0939   CHOLHDL 5.5 11/23/2012 0949   VLDL 46 (H) 11/23/2012 0949   LDLCALC 111 (H) 01/28/2013 0939    Radiology    Dg Chest 2 View  Result Date: 08/22/2017 CLINICAL DATA:  Chest pain EXAM: CHEST - 2 VIEW COMPARISON:  08/20/2017 FINDINGS: The heart size and mediastinal contours are within normal limits. Both lungs are clear. The visualized skeletal structures are unremarkable. IMPRESSION: No active cardiopulmonary disease. Electronically Signed   By: Franchot Gallo M.D.   On: 08/22/2017 08:49   Dg Chest 2 View  Result Date: 08/20/2017 CLINICAL DATA:  Central chest pain EXAM: CHEST - 2 VIEW COMPARISON:  05/09/2016 FINDINGS: Heart and mediastinal contours are within normal limits. No focal opacities or effusions. No acute bony abnormality. IMPRESSION: No active cardiopulmonary disease. Electronically Signed   By: Rolm Baptise M.D.   On: 08/20/2017 19:35    Cardiac Studies   Cath/PCI to RCA 02/20/2013; staged PCI to LAD 02/14/2013  Echo 08/21/17 - Left ventricle: The cavity size was normal. Wall thickness was   increased in a pattern of mild LVH. Systolic function was normal.   The estimated ejection fraction was in the range of 55% to 60%.   Wall motion was normal; there were no regional wall motion   abnormalities. Doppler  parameters are consistent with abnormal   left ventricular relaxation (grade 1 diastolic dysfunction).  Cath 08/21/17  The left ventricular ejection fraction is  55-65% by visual estimate.  LV end diastolic pressure is normal.  The left ventricular systolic function is normal.  Previously placed Prox RCA to Mid RCA stent (unknown type) is widely patent.  Dist LM lesion is 75% stenosed.  Ost LAD lesion is 75% stenosed.  Previously placed Prox LAD stent (unknown type) is widely patent.   1. Severe distal left main and ostial LAD stenosis confirmed by positive FFR analysis (0.76) 2. Continued patency of the stented segments in the LAD and RCA 3. Patent left circumflex 4. Normal LV systolic function  Recommend: TCTS consult for CABG. IV heparin, inpatient status with severe left main stenosis.  Patient Profile     57 year old man with known coronary disease and peripheral vascular disease, who presents with an episode of chest discomfort concerning for unstable angina. Found to have severe disease, recommended for CABG.  Assessment & Plan    1. [place], cath with severe disease -CABG planned for 8/19 -echo done -getting all of his preoperative studies completed  2. PVD: s/p L fem-pop  3. Anticoagulation: holding rivaroxaban, on heparin for prior DVT  4. OSA  5. HLD: continue atorvastatin 80 mg  Buford Dresser, MD, PhD [facility]  [address] [place], [place] [address] [phone]

## 2017-08-23 DIAGNOSIS — R079 Chest pain, unspecified: Secondary | ICD-10-CM

## 2017-08-23 LAB — CBC
HCT: 41.4 % (ref 39.0–52.0)
Hemoglobin: 13.3 g/dL (ref 13.0–17.0)
MCH: 29.8 pg (ref 26.0–34.0)
MCHC: 32.1 g/dL (ref 30.0–36.0)
MCV: 92.8 fL (ref 78.0–100.0)
Platelets: 200 10*3/uL (ref 150–400)
RBC: 4.46 MIL/uL (ref 4.22–5.81)
RDW: 14.6 % (ref 11.5–15.5)
WBC: 9.9 10*3/uL (ref 4.0–10.5)

## 2017-08-23 LAB — HEPARIN LEVEL (UNFRACTIONATED): Heparin Unfractionated: 0.42 IU/mL (ref 0.30–0.70)

## 2017-08-23 LAB — PREPARE RBC (CROSSMATCH)

## 2017-08-23 LAB — MAGNESIUM: Magnesium: 1.8 mg/dL (ref 1.7–2.4)

## 2017-08-23 MED ORDER — CHLORHEXIDINE GLUCONATE 0.12 % MT SOLN
15.0000 mL | Freq: Once | OROMUCOSAL | Status: AC
  Administered 2017-08-24: 15 mL via OROMUCOSAL
  Filled 2017-08-23: qty 15

## 2017-08-23 MED ORDER — CHLORHEXIDINE GLUCONATE 4 % EX LIQD
60.0000 mL | Freq: Once | CUTANEOUS | Status: AC
  Administered 2017-08-24: 4 via TOPICAL
  Filled 2017-08-23: qty 60

## 2017-08-23 MED ORDER — DIAZEPAM 5 MG PO TABS
5.0000 mg | ORAL_TABLET | Freq: Once | ORAL | Status: AC
  Administered 2017-08-24: 5 mg via ORAL
  Filled 2017-08-23: qty 1

## 2017-08-23 MED ORDER — METOPROLOL TARTRATE 12.5 MG HALF TABLET
12.5000 mg | ORAL_TABLET | Freq: Once | ORAL | Status: AC
  Administered 2017-08-24: 12.5 mg via ORAL
  Filled 2017-08-23: qty 1

## 2017-08-23 MED ORDER — CHLORHEXIDINE GLUCONATE 4 % EX LIQD
60.0000 mL | Freq: Once | CUTANEOUS | Status: AC
  Administered 2017-08-23: 4 via TOPICAL
  Filled 2017-08-23: qty 60

## 2017-08-23 MED ORDER — ~~LOC~~ CARDIAC SURGERY, PATIENT & FAMILY EDUCATION
Freq: Once | Status: AC
  Administered 2017-08-23: 05:00:00
  Filled 2017-08-23: qty 1

## 2017-08-23 MED ORDER — ALPRAZOLAM 0.25 MG PO TABS: 0.2500 mg | ORAL_TABLET | ORAL | Status: DC | PRN

## 2017-08-23 MED ORDER — BISACODYL 5 MG PO TBEC: 5.0000 mg | DELAYED_RELEASE_TABLET | Freq: Once | ORAL | Status: DC

## 2017-08-23 MED ORDER — TEMAZEPAM 15 MG PO CAPS: 15.0000 mg | ORAL_CAPSULE | Freq: Once | ORAL | Status: DC | PRN

## 2017-08-23 NOTE — Progress Notes (Signed)
Pt has home cpap unit and places self on/off when needed. RT will monitor.

## 2017-08-23 NOTE — Progress Notes (Signed)
Progress Note  Patient Name: Paul Todd   Date of Encounter: 08/23/2017  Primary Cardiologist: Hilty  Subjective   No recurrent chest pain. Doing well. All questions answered.  Inpatient Medications    Scheduled Meds: . aspirin EC  81 mg Oral Daily  . atorvastatin  80 mg Oral q1800  . docusate sodium  100 mg Oral BID  . dofetilide  250 mcg Oral BID  . DULoxetine  60 mg Oral QPM  . [START ON 08/24/2017] heparin-papaverine-plasmalyte irrigation   Irrigation To OR  . [START ON 08/24/2017] magnesium sulfate  40 mEq Other To OR  . metoprolol succinate  50 mg Oral Daily  . multivitamin with minerals  1 tablet Oral Daily  . pantoprazole  40 mg Oral Daily  . [START ON 08/24/2017] potassium chloride  80 mEq Other To OR  . sodium chloride flush  3 mL Intravenous Q12H  . [START ON 08/24/2017] tranexamic acid  15 mg/kg Intravenous To OR  . [START ON 08/24/2017] tranexamic acid  2 mg/kg Intracatheter To OR   Continuous Infusions: . sodium chloride    . [START ON 08/24/2017] cefUROXime (ZINACEF)  IV    . [START ON 08/24/2017] cefUROXime (ZINACEF)  IV    . [START ON 08/24/2017] dexmedetomidine    . [START ON 08/24/2017] DOPamine    . [START ON 08/24/2017] epinephrine    . [START ON 08/24/2017] heparin 30,000 units/NS 1000 mL solution for CELLSAVER    . heparin 1,400 Units/hr (08/23/17 0913)  . [START ON 08/24/2017] insulin (NOVOLIN-R) infusion    . [START ON 08/24/2017] milrinone    . [START ON 08/24/2017] nitroGLYCERIN    . [START ON 08/24/2017] phenylephrine 20mg /243mL NS (0.08mg /ml) infusion    . [START ON 08/24/2017] tranexamic acid (CYKLOKAPRON) infusion ([facility])    . [START ON 08/24/2017] vancomycin     PRN Meds: sodium chloride, acetaminophen, albuterol, ondansetron (ZOFRAN) IV, sodium chloride flush   Vital Signs    Vitals:   08/23/17 0531 08/23/17 0532 08/23/17 0849 08/23/17 0916  BP: 138/82  135/72 140/67  Pulse: (!) 48  (!) 51 (!) 57  Resp:      Temp: 97.7 F (36.5 C)  97.8  F (36.6 C)   TempSrc: Oral  Oral   SpO2: 99%  93%   Weight:  118.2 kg    Height:        Intake/Output Summary (Last 24 hours) at 08/23/2017 1041 Last data filed at 08/23/2017 0849 Gross per 24 hour  Intake 477.88 ml  Output -  Net 477.88 ml    I/O since admission:  +120  Filed Weights   08/22/17 0354 08/23/17 0532  Weight: 117.5 kg 118.2 kg    Telemetry    NSR - Personally Reviewed  ECG    ECG no new  Physical Exam   BP 140/67   Pulse (!) 57   Temp 97.8 F (36.6 C) (Oral)   Resp 18   Ht 5\' 7"  (1.702 m)   Wt 118.2 kg   SpO2 93%   BMI 40.80 kg/m  General: Alert, oriented, no distress.  Skin: normal turgor, no rashes, warm and dry HEENT: Normocephalic, atraumatic. sclera anicteric; extraocular muscles intact; Mouth/Parynx benign Neck: No JVD, no carotid bruits; normal carotid upstroke Lungs: clear to ausculatation and percussion; no wheezing or rales Chest wall: without tenderness to palpitation Heart: PMI not displaced, RRR, s1 s2 normal, 1/6 systolic murmur, no diastolic murmur, no rubs, gallops, thrills, or heaves Abdomen: soft, nontender;  BS+ Pulses 2+ bilateral radial and DP Musculoskeletal: full range of motion, normal strength, no joint deformities Extremities: no clubbing cyanosis or edema Neurologic: grossly nonfocal; Cranial nerves grossly wnl Psychologic: Normal mood and affect   Labs    Chemistry Recent Labs  Lab 08/20/17 1824 08/21/17 0011 08/22/17 0405  NA 141 143 141  K 3.7 3.7 4.0  CL 110 108 112*  CO2 22  --  23  GLUCOSE 109* 113* 123*  BUN 17 19 12   CREATININE 1.00 0.90 0.86  CALCIUM 9.0  --  8.4*  PROT  --   --  6.8  ALBUMIN  --   --  3.6  AST  --   --  29  ALT  --   --  41  ALKPHOS  --   --  79  BILITOT  --   --  0.8  GFRNONAA >60  --  >60  GFRAA >60  --  >60  ANIONGAP 9  --  6     Hematology Recent Labs  Lab 08/20/17 1824 08/21/17 0011 08/22/17 0405 08/23/17 0333  WBC 11.7*  --  9.7 9.9  RBC 4.92  --  4.68  4.46  HGB 14.2 14.3 13.8 13.3  HCT 45.3 42.0 43.2 41.4  MCV 92.1  --  92.3 92.8  MCH 28.9  --  29.5 29.8  MCHC 31.3  --  31.9 32.1  RDW 14.5  --  14.6 14.6  PLT 206  --  191 200    Cardiac Enzymes Recent Labs  Lab 08/21/17 0342  TROPONINI <0.03    Recent Labs  Lab 08/20/17 1836 08/21/17 0009  TROPIPOC 0.03 0.00     BNPNo results for input(s): BNP, PROBNP in the last 168 hours.   DDimer No results for input(s): DDIMER in the last 168 hours.   Lipid Panel     Component Value Date/Time   CHOL 195 01/28/2013 0939   TRIG 206 (H) 01/28/2013 0939   HDL 43 01/28/2013 0939   CHOLHDL 5.5 11/23/2012 0949   VLDL 46 (H) 11/23/2012 0949   LDLCALC 111 (H) 01/28/2013 0939    Radiology    Dg Chest 2 View  Result Date: 08/22/2017 CLINICAL DATA:  Chest pain EXAM: CHEST - 2 VIEW COMPARISON:  08/20/2017 FINDINGS: The heart size and mediastinal contours are within normal limits. Both lungs are clear. The visualized skeletal structures are unremarkable. IMPRESSION: No active cardiopulmonary disease. Electronically Signed   By: Franchot Gallo M.D.   On: 08/22/2017 08:49    Cardiac Studies   Cath/PCI to RCA 02/20/2013; staged PCI to LAD 02/14/2013  Echo 08/21/17 - Left ventricle: The cavity size was normal. Wall thickness was   increased in a pattern of mild LVH. Systolic function was normal.   The estimated ejection fraction was in the range of 55% to 60%.   Wall motion was normal; there were no regional wall motion   abnormalities. Doppler parameters are consistent with abnormal   left ventricular relaxation (grade 1 diastolic dysfunction).  Cath 08/21/17  The left ventricular ejection fraction is 55-65% by visual estimate.  LV end diastolic pressure is normal.  The left ventricular systolic function is normal.  Previously placed Prox RCA to Mid RCA stent (unknown type) is widely patent.  Dist LM lesion is 75% stenosed.  Ost LAD lesion is 75% stenosed.  Previously placed  Prox LAD stent (unknown type) is widely patent.   1. Severe distal left main and ostial LAD stenosis confirmed  by positive FFR analysis (0.76) 2. Continued patency of the stented segments in the LAD and RCA 3. Patent left circumflex 4. Normal LV systolic function  Recommend: TCTS consult for CABG. IV heparin, inpatient status with severe left main stenosis.  Patient Profile     57 year old man with known coronary disease and peripheral vascular disease, who presents with an episode of chest discomfort concerning for unstable angina. Found to have severe disease, recommended for CABG.  Assessment & Plan    1. [place], cath with severe disease -CABG planned for 8/19 -echo done -getting all of his preoperative studies completed  2. PVD: s/p L fem-pop  3. Anticoagulation: holding rivaroxaban, on heparin for prior DVT  4. OSA  5. HLD: continue atorvastatin 80 mg  Buford Dresser, MD, PhD [facility]  [address] [place], [place] [address] [phone]

## 2017-08-23 NOTE — Progress Notes (Signed)
ANTICOAGULATION CONSULT NOTE Pharmacy Consult for Heparin Indication: H/O PE and PAF  No Known Allergies  Patient Measurements: Height: 5\' 7"  (170.2 cm) Weight: 260 lb 8 oz (118.2 kg) IBW/kg (Calculated) : 66.1 Heparin Dosing Weight: 95 kg  Vital Signs: Temp: 97.8 F (36.6 C) (08/18 0849) Temp Source: Oral (08/18 0849) BP: 140/67 (08/18 0916) Pulse Rate: 57 (08/18 0916)  Labs: Recent Labs    08/20/17 1824 08/21/17 0011  08/21/17 0334 08/21/17 0342 08/22/17 0405 08/22/17 1403 08/23/17 0333  HGB 14.2 14.3  --   --   --  13.8  --  13.3  HCT 45.3 42.0  --   --   --  43.2  --  41.4  PLT 206  --   --   --   --  191  --  200  APTT  --   --   --  26  --  42*  --   --   LABPROT  --   --   --  13.3  --  13.2  --   --   INR  --   --   --  1.02  --  1.01  --   --   HEPARINUNFRC  --   --    < > 0.30  --  0.26* 0.37 0.42  CREATININE 1.00 0.90  --   --   --  0.86  --   --   TROPONINI  --   --   --   --  <0.03  --   --   --    < > = values in this interval not displayed.    Estimated Creatinine Clearance: 116.5 mL/min (by C-G formula based on SCr of 0.86 mg/dL).  Assessment: 57 y.o. male with h/o Afib and PE and Xarelto on hold while awaiting CABG, for heparin.   Heparin level this morning was therapeutic at 0.42, on 1400 units/hr.  this afternoon after rate increase came back therapeutic at 0.37, on 1400 units/hr. Hgb 13.3, plt 200. No infusion issues. No s/sx of bleeding.   Goal of Therapy:  Heparin level 0.3-0.7 units/ml Monitor platelets by anticoagulation protocol: Yes   Plan:  Continue heparin at 1400 units/hr Monitor daily HL, CBC, for s/sx of bleeding  Doylene Canard, PharmD Clinical Pharmacist  Pager: [phone] Phone: [phone] 08/23/2017,11:10 AM

## 2017-08-23 NOTE — Anesthesia Preprocedure Evaluation (Addendum)
Anesthesia Evaluation  Patient identified by MRN, date of birth, ID band Patient awake    Reviewed: Allergy & Precautions, NPO status , Patient's Chart, lab work & pertinent test results  Airway Mallampati: II  TM Distance: >3 FB     Dental   Pulmonary sleep apnea , former smoker,    breath sounds clear to auscultation       Cardiovascular hypertension, + angina + CAD, + Past MI and + Peripheral Vascular Disease   Rhythm:Regular Rate:Normal     Neuro/Psych    GI/Hepatic negative GI ROS, Neg liver ROS,   Endo/Other  diabetes  Renal/GU Renal disease     Musculoskeletal   Abdominal   Peds  Hematology   Anesthesia Other Findings   Reproductive/Obstetrics                            Anesthesia Physical Anesthesia Plan  ASA: III  Anesthesia Plan: General   Post-op Pain Management:    Induction: Intravenous  PONV Risk Score and Plan: 2 and Midazolam and Treatment may vary due to age or medical condition  Airway Management Planned: Oral ETT  Additional Equipment: Arterial line, PA Cath, TEE and Ultrasound Guidance Line Placement  Intra-op Plan:   Post-operative Plan: Post-operative intubation/ventilation  Informed Consent: I have reviewed the patients History and Physical, chart, labs and discussed the procedure including the risks, benefits and alternatives for the proposed anesthesia with the patient or authorized representative who has indicated his/her understanding and acceptance.   Dental advisory given  Plan Discussed with: Anesthesiologist and CRNA  Anesthesia Plan Comments:        Anesthesia Quick Evaluation

## 2017-08-24 ENCOUNTER — Inpatient Hospital Stay (HOSPITAL_COMMUNITY): Payer: BLUE CROSS/BLUE SHIELD

## 2017-08-24 ENCOUNTER — Encounter (HOSPITAL_COMMUNITY)
Admission: EM | Disposition: A | Discharge status: Home / Self Care | Payer: Self-pay | Source: Home / Self Care | Attending: Cardiothoracic Surgery

## 2017-08-24 ENCOUNTER — Encounter (HOSPITAL_COMMUNITY): Payer: Self-pay | Admitting: Certified Registered Nurse Anesthetist

## 2017-08-24 ENCOUNTER — Inpatient Hospital Stay (HOSPITAL_COMMUNITY): Payer: BLUE CROSS/BLUE SHIELD | Admitting: Anesthesiology

## 2017-08-24 DIAGNOSIS — I25110 Atherosclerotic heart disease of native coronary artery with unstable angina pectoris: Secondary | ICD-10-CM

## 2017-08-24 DIAGNOSIS — Z951 Presence of aortocoronary bypass graft: Secondary | ICD-10-CM

## 2017-08-24 HISTORY — PX: TEE WITHOUT CARDIOVERSION: SHX5443

## 2017-08-24 HISTORY — PX: CORONARY ARTERY BYPASS GRAFT: SHX141

## 2017-08-24 LAB — BASIC METABOLIC PANEL
Anion gap: 9 (ref 5–15)
BUN: 10 mg/dL (ref 6–20)
CO2: 24 mmol/L (ref 22–32)
Calcium: 9.3 mg/dL (ref 8.9–10.3)
Chloride: 109 mmol/L (ref 98–111)
Creatinine, Ser: 0.93 mg/dL (ref 0.61–1.24)
GFR calc Af Amer: >60 mL/min (ref >60)
GFR calc non Af Amer: >60 mL/min (ref >60)
Glucose, Bld: 129 mg/dL — ABNORMAL HIGH (ref 70–99)
Potassium: 4.1 mmol/L (ref 3.5–5.1)
Sodium: 142 mmol/L (ref 135–145)

## 2017-08-24 LAB — CBC
HCT: 44.6 % (ref 39.0–52.0)
HCT: 33.4 % — ABNORMAL LOW (ref 39.0–52.0)
HCT: 34 % — ABNORMAL LOW (ref 39.0–52.0)
Hemoglobin: 14.1 g/dL (ref 13.0–17.0)
Hemoglobin: 10.6 g/dL — ABNORMAL LOW (ref 13.0–17.0)
Hemoglobin: 10.9 g/dL — ABNORMAL LOW (ref 13.0–17.0)
MCH: 29 pg (ref 26.0–34.0)
MCH: 29.2 pg (ref 26.0–34.0)
MCH: 29.5 pg (ref 26.0–34.0)
MCHC: 31.6 g/dL (ref 30.0–36.0)
MCHC: 31.7 g/dL (ref 30.0–36.0)
MCHC: 32.1 g/dL (ref 30.0–36.0)
MCV: 91.8 fL (ref 78.0–100.0)
MCV: 92 fL (ref 78.0–100.0)
MCV: 92.1 fL (ref 78.0–100.0)
PLATELETS: 112 10*3/uL — AB (ref 150–400)
Platelets: 185 10*3/uL (ref 150–400)
Platelets: 132 10*3/uL — ABNORMAL LOW (ref 150–400)
RBC: 4.86 MIL/uL (ref 4.22–5.81)
RBC: 3.63 MIL/uL — ABNORMAL LOW (ref 4.22–5.81)
RBC: 3.69 MIL/uL — ABNORMAL LOW (ref 4.22–5.81)
RDW: 14.6 % (ref 11.5–15.5)
RDW: 14.2 % (ref 11.5–15.5)
RDW: 14.4 % (ref 11.5–15.5)
WBC: 10.1 10*3/uL (ref 4.0–10.5)
WBC: 16.5 10*3/uL — AB (ref 4.0–10.5)
WBC: 14.2 10*3/uL — ABNORMAL HIGH (ref 4.0–10.5)

## 2017-08-24 LAB — POCT I-STAT, CHEM 8
BUN: 11 mg/dL (ref 6–20)
BUN: 10 mg/dL (ref 6–20)
BUN: 10 mg/dL (ref 6–20)
BUN: 9 mg/dL (ref 6–20)
BUN: 8 mg/dL (ref 6–20)
BUN: 8 mg/dL (ref 6–20)
BUN: 9 mg/dL (ref 6–20)
Calcium, Ion: 1.22 mmol/L (ref 1.15–1.40)
Calcium, Ion: 1.17 mmol/L (ref 1.15–1.40)
Calcium, Ion: 1.02 mmol/L — ABNORMAL LOW (ref 1.15–1.40)
Calcium, Ion: 1.17 mmol/L (ref 1.15–1.40)
Calcium, Ion: 1.03 mmol/L — ABNORMAL LOW (ref 1.15–1.40)
Calcium, Ion: 1.02 mmol/L — ABNORMAL LOW (ref 1.15–1.40)
Calcium, Ion: 1.15 mmol/L (ref 1.15–1.40)
Chloride: 105 mmol/L (ref 98–111)
Chloride: 106 mmol/L (ref 98–111)
Chloride: 102 mmol/L (ref 98–111)
Chloride: 104 mmol/L (ref 98–111)
Chloride: 104 mmol/L (ref 98–111)
Chloride: 103 mmol/L (ref 98–111)
Chloride: 106 mmol/L (ref 98–111)
Creatinine, Ser: 0.7 mg/dL (ref 0.61–1.24)
Creatinine, Ser: 0.8 mg/dL (ref 0.61–1.24)
Creatinine, Ser: 0.6 mg/dL — ABNORMAL LOW (ref 0.61–1.24)
Creatinine, Ser: 0.6 mg/dL — ABNORMAL LOW (ref 0.61–1.24)
Creatinine, Ser: 0.6 mg/dL — ABNORMAL LOW (ref 0.61–1.24)
Creatinine, Ser: 0.6 mg/dL — ABNORMAL LOW (ref 0.61–1.24)
Creatinine, Ser: 0.7 mg/dL (ref 0.61–1.24)
Glucose, Bld: 128 mg/dL — ABNORMAL HIGH (ref 70–99)
Glucose, Bld: 151 mg/dL — ABNORMAL HIGH (ref 70–99)
Glucose, Bld: 156 mg/dL — ABNORMAL HIGH (ref 70–99)
Glucose, Bld: 159 mg/dL — ABNORMAL HIGH (ref 70–99)
Glucose, Bld: 190 mg/dL — ABNORMAL HIGH (ref 70–99)
Glucose, Bld: 169 mg/dL — ABNORMAL HIGH (ref 70–99)
Glucose, Bld: 141 mg/dL — ABNORMAL HIGH (ref 70–99)
HCT: 39 % (ref 39.0–52.0)
HCT: 36 % — ABNORMAL LOW (ref 39.0–52.0)
HCT: 31 % — ABNORMAL LOW (ref 39.0–52.0)
HCT: 38 % — ABNORMAL LOW (ref 39.0–52.0)
HCT: 32 % — ABNORMAL LOW (ref 39.0–52.0)
HCT: 30 % — ABNORMAL LOW (ref 39.0–52.0)
HCT: 30 % — ABNORMAL LOW (ref 39.0–52.0)
Hemoglobin: 13.3 g/dL (ref 13.0–17.0)
Hemoglobin: 12.2 g/dL — ABNORMAL LOW (ref 13.0–17.0)
Hemoglobin: 10.5 g/dL — ABNORMAL LOW (ref 13.0–17.0)
Hemoglobin: 12.9 g/dL — ABNORMAL LOW (ref 13.0–17.0)
Hemoglobin: 10.9 g/dL — ABNORMAL LOW (ref 13.0–17.0)
Hemoglobin: 10.2 g/dL — ABNORMAL LOW (ref 13.0–17.0)
Hemoglobin: 10.2 g/dL — ABNORMAL LOW (ref 13.0–17.0)
Potassium: 4.5 mmol/L (ref 3.5–5.1)
Potassium: 4.3 mmol/L (ref 3.5–5.1)
Potassium: 3.8 mmol/L (ref 3.5–5.1)
Potassium: 4 mmol/L (ref 3.5–5.1)
Potassium: 4 mmol/L (ref 3.5–5.1)
Potassium: 4.2 mmol/L (ref 3.5–5.1)
Potassium: 4.7 mmol/L (ref 3.5–5.1)
Sodium: 141 mmol/L (ref 135–145)
Sodium: 140 mmol/L (ref 135–145)
Sodium: 139 mmol/L (ref 135–145)
Sodium: 140 mmol/L (ref 135–145)
Sodium: 139 mmol/L (ref 135–145)
Sodium: 140 mmol/L (ref 135–145)
Sodium: 140 mmol/L (ref 135–145)
TCO2: 23 mmol/L (ref 22–32)
TCO2: 23 mmol/L (ref 22–32)
TCO2: 24 mmol/L (ref 22–32)
TCO2: 25 mmol/L (ref 22–32)
TCO2: 23 mmol/L (ref 22–32)
TCO2: 23 mmol/L (ref 22–32)
TCO2: 23 mmol/L (ref 22–32)

## 2017-08-24 LAB — POCT I-STAT 3, ART BLOOD GAS (G3+)
Acid-base deficit: 2 mmol/L (ref 0.0–2.0)
Acid-base deficit: 2 mmol/L (ref 0.0–2.0)
Acid-base deficit: 3 mmol/L — ABNORMAL HIGH (ref 0.0–2.0)
Acid-base deficit: 3 mmol/L — ABNORMAL HIGH (ref 0.0–2.0)
Bicarbonate: 24.2 mmol/L (ref 20.0–28.0)
Bicarbonate: 22.8 mmol/L (ref 20.0–28.0)
Bicarbonate: 23.4 mmol/L (ref 20.0–28.0)
Bicarbonate: 22.9 mmol/L (ref 20.0–28.0)
O2 Saturation: 100 %
O2 Saturation: 99 %
O2 Saturation: 98 %
O2 Saturation: 99 %
Patient temperature: 35.9
Patient temperature: 36.4
TCO2: 25 mmol/L (ref 22–32)
TCO2: 24 mmol/L (ref 22–32)
TCO2: 25 mmol/L (ref 22–32)
TCO2: 24 mmol/L (ref 22–32)
pCO2 arterial: 43.7 mmHg (ref 32.0–48.0)
pCO2 arterial: 38.4 mmHg (ref 32.0–48.0)
pCO2 arterial: 45.2 mmHg (ref 32.0–48.0)
pCO2 arterial: 41.9 mmHg (ref 32.0–48.0)
pH, Arterial: 7.35 (ref 7.350–7.450)
pH, Arterial: 7.382 (ref 7.350–7.450)
pH, Arterial: 7.316 — ABNORMAL LOW (ref 7.350–7.450)
pH, Arterial: 7.343 — ABNORMAL LOW (ref 7.350–7.450)
pO2, Arterial: 273 mmHg — ABNORMAL HIGH (ref 83.0–108.0)
pO2, Arterial: 119 mmHg — ABNORMAL HIGH (ref 83.0–108.0)
pO2, Arterial: 104 mmHg (ref 83.0–108.0)
pO2, Arterial: 144 mmHg — ABNORMAL HIGH (ref 83.0–108.0)

## 2017-08-24 LAB — PROTIME-INR
INR: 1.27
Prothrombin Time: 15.7 seconds — ABNORMAL HIGH (ref 11.4–15.2)

## 2017-08-24 LAB — GLUCOSE, CAPILLARY
Glucose-Capillary: 141 mg/dL — ABNORMAL HIGH (ref 70–99)
Glucose-Capillary: 139 mg/dL — ABNORMAL HIGH (ref 70–99)
Glucose-Capillary: 133 mg/dL — ABNORMAL HIGH (ref 70–99)
Glucose-Capillary: 134 mg/dL — ABNORMAL HIGH (ref 70–99)
Glucose-Capillary: 136 mg/dL — ABNORMAL HIGH (ref 70–99)
Glucose-Capillary: 144 mg/dL — ABNORMAL HIGH (ref 70–99)
Glucose-Capillary: 160 mg/dL — ABNORMAL HIGH (ref 70–99)
Glucose-Capillary: 136 mg/dL — ABNORMAL HIGH (ref 70–99)

## 2017-08-24 LAB — POCT I-STAT 4, (NA,K, GLUC, HGB,HCT)
Glucose, Bld: 148 mg/dL — ABNORMAL HIGH (ref 70–99)
HCT: 31 % — ABNORMAL LOW (ref 39.0–52.0)
Hemoglobin: 10.5 g/dL — ABNORMAL LOW (ref 13.0–17.0)
Potassium: 3.9 mmol/L (ref 3.5–5.1)
Sodium: 141 mmol/L (ref 135–145)

## 2017-08-24 LAB — HEPARIN LEVEL (UNFRACTIONATED): Heparin Unfractionated: 0.37 IU/mL (ref 0.30–0.70)

## 2017-08-24 LAB — HEMOGLOBIN AND HEMATOCRIT, BLOOD
HCT: 33.5 % — ABNORMAL LOW (ref 39.0–52.0)
Hemoglobin: 10.8 g/dL — ABNORMAL LOW (ref 13.0–17.0)

## 2017-08-24 LAB — CREATININE, SERUM
Creatinine, Ser: 0.81 mg/dL (ref 0.61–1.24)
GFR calc Af Amer: >60 mL/min (ref >60)
GFR calc non Af Amer: >60 mL/min (ref >60)

## 2017-08-24 LAB — APTT: APTT: 27 s (ref 24–36)

## 2017-08-24 LAB — MAGNESIUM: Magnesium: 2.8 mg/dL — ABNORMAL HIGH (ref 1.7–2.4)

## 2017-08-24 LAB — PLATELET COUNT: Platelets: 141 10*3/uL — ABNORMAL LOW (ref 150–400)

## 2017-08-24 SURGERY — CORONARY ARTERY BYPASS GRAFTING (CABG)
Anesthesia: General | Site: Chest

## 2017-08-24 MED ORDER — METOCLOPRAMIDE HCL 5 MG/ML IJ SOLN
10.0000 mg | Freq: Four times a day (QID) | INTRAMUSCULAR | Status: DC
  Administered 2017-08-24 – 2017-08-27 (×12): 10 mg via INTRAVENOUS
  Filled 2017-08-24 (×11): qty 2

## 2017-08-24 MED ORDER — LACTATED RINGERS IV SOLN
INTRAVENOUS | Status: DC | PRN
  Administered 2017-08-24 (×2): via INTRAVENOUS

## 2017-08-24 MED ORDER — CALCIUM CHLORIDE 10 % IV SOLN
INTRAVENOUS | Status: AC
  Filled 2017-08-24: qty 10

## 2017-08-24 MED ORDER — PROTAMINE SULFATE 10 MG/ML IV SOLN
INTRAVENOUS | Status: AC
  Filled 2017-08-24: qty 25

## 2017-08-24 MED ORDER — LACTATED RINGERS IV SOLN
INTRAVENOUS | Status: DC | PRN
  Administered 2017-08-24: 07:00:00 via INTRAVENOUS

## 2017-08-24 MED ORDER — CHLORHEXIDINE GLUCONATE CLOTH 2 % EX PADS
6.0000 | MEDICATED_PAD | Freq: Every day | CUTANEOUS | Status: DC
  Administered 2017-08-24 – 2017-09-02 (×5): 6 via TOPICAL

## 2017-08-24 MED ORDER — HEPARIN SODIUM (PORCINE) 1000 UNIT/ML IJ SOLN
INTRAMUSCULAR | Status: DC | PRN
  Administered 2017-08-24 (×2): 2000 [IU] via INTRAVENOUS
  Administered 2017-08-24: 32000 [IU] via INTRAVENOUS

## 2017-08-24 MED ORDER — PROTAMINE SULFATE 10 MG/ML IV SOLN
INTRAVENOUS | Status: AC
  Filled 2017-08-24: qty 5

## 2017-08-24 MED ORDER — BISACODYL 10 MG RE SUPP: 10.0000 mg | Freq: Every day | RECTAL | Status: DC

## 2017-08-24 MED ORDER — SODIUM CHLORIDE 0.9% FLUSH
10.0000 mL | Freq: Two times a day (BID) | INTRAVENOUS | Status: DC
  Administered 2017-08-24 – 2017-08-25 (×2): 10 mL
  Administered 2017-08-25: 20 mL
  Administered 2017-08-26: 10 mL
  Administered 2017-08-26 – 2017-08-28 (×3): 20 mL
  Administered 2017-08-28 – 2017-09-02 (×5): 10 mL

## 2017-08-24 MED ORDER — ALBUMIN HUMAN 5 % IV SOLN
INTRAVENOUS | Status: DC | PRN
  Administered 2017-08-24 (×2): via INTRAVENOUS

## 2017-08-24 MED ORDER — CHLORHEXIDINE GLUCONATE 0.12 % MT SOLN
15.0000 mL | OROMUCOSAL | Status: AC
  Administered 2017-08-24: 15 mL via OROMUCOSAL

## 2017-08-24 MED ORDER — BISACODYL 5 MG PO TBEC
10.0000 mg | DELAYED_RELEASE_TABLET | Freq: Every day | ORAL | Status: DC
  Administered 2017-08-25 – 2017-09-02 (×9): 10 mg via ORAL
  Filled 2017-08-24 (×9): qty 2

## 2017-08-24 MED ORDER — SODIUM CHLORIDE 0.9% IV SOLUTION: Freq: Once | INTRAVENOUS | Status: DC

## 2017-08-24 MED ORDER — INSULIN REGULAR BOLUS VIA INFUSION
0.0000 [IU] | Freq: Three times a day (TID) | INTRAVENOUS | Status: DC
  Filled 2017-08-24: qty 10

## 2017-08-24 MED ORDER — SODIUM CHLORIDE 0.9% FLUSH
3.0000 mL | Freq: Two times a day (BID) | INTRAVENOUS | Status: DC
  Administered 2017-08-25 – 2017-09-02 (×13): 3 mL via INTRAVENOUS

## 2017-08-24 MED ORDER — AMIODARONE HCL IN DEXTROSE 360-4.14 MG/200ML-% IV SOLN
60.0000 mg/h | INTRAVENOUS | Status: AC
  Administered 2017-08-24: 13:00:00 via INTRAVENOUS
  Administered 2017-08-24: 60 mg/h via INTRAVENOUS
  Filled 2017-08-24: qty 200

## 2017-08-24 MED ORDER — METOPROLOL TARTRATE 25 MG/10 ML ORAL SUSPENSION: 12.5000 mg | Freq: Two times a day (BID) | ORAL | Status: DC

## 2017-08-24 MED ORDER — NOREPINEPHRINE 4 MG/250ML-% IV SOLN
0.0000 ug/min | INTRAVENOUS | Status: DC
  Administered 2017-08-24: 2 ug/min via INTRAVENOUS
  Filled 2017-08-24: qty 250

## 2017-08-24 MED ORDER — TRANEXAMIC ACID 1000 MG/10ML IV SOLN
1.5000 mg/kg/h | INTRAVENOUS | Status: DC
  Administered 2017-08-24: 177.9 mg/h via INTRAVENOUS
  Filled 2017-08-24: qty 10

## 2017-08-24 MED ORDER — SODIUM CHLORIDE 0.45 % IV SOLN
INTRAVENOUS | Status: DC | PRN
  Administered 2017-08-24: 15:00:00 via INTRAVENOUS

## 2017-08-24 MED ORDER — 0.9 % SODIUM CHLORIDE (POUR BTL) OPTIME
TOPICAL | Status: DC | PRN
  Administered 2017-08-24: 5000 mL

## 2017-08-24 MED ORDER — SODIUM CHLORIDE 0.9 % IV SOLN
INTRAVENOUS | Status: DC
  Administered 2017-08-24: 15:00:00 via INTRAVENOUS

## 2017-08-24 MED ORDER — MILRINONE LACTATE IN DEXTROSE 20-5 MG/100ML-% IV SOLN
0.1250 ug/kg/min | INTRAVENOUS | Status: DC
  Administered 2017-08-24: 0.125 ug/kg/min via INTRAVENOUS

## 2017-08-24 MED ORDER — SUCCINYLCHOLINE CHLORIDE 200 MG/10ML IV SOSY
PREFILLED_SYRINGE | INTRAVENOUS | Status: AC
  Filled 2017-08-24: qty 10

## 2017-08-24 MED ORDER — NOREPINEPHRINE 4 MG/250ML-% IV SOLN
0.0000 ug/min | INTRAVENOUS | Status: DC
  Filled 2017-08-24: qty 250

## 2017-08-24 MED ORDER — LACTATED RINGERS IV SOLN: INTRAVENOUS | Status: DC

## 2017-08-24 MED ORDER — PHENYLEPHRINE 40 MCG/ML (10ML) SYRINGE FOR IV PUSH (FOR BLOOD PRESSURE SUPPORT)
PREFILLED_SYRINGE | INTRAVENOUS | Status: DC | PRN
  Administered 2017-08-24: 80 ug via INTRAVENOUS
  Administered 2017-08-24: 40 ug via INTRAVENOUS
  Administered 2017-08-24 (×2): 80 ug via INTRAVENOUS
  Administered 2017-08-24: 40 ug via INTRAVENOUS
  Administered 2017-08-24 (×2): 80 ug via INTRAVENOUS

## 2017-08-24 MED ORDER — ALBUMIN HUMAN 5 % IV SOLN
250.0000 mL | INTRAVENOUS | Status: AC | PRN
  Administered 2017-08-24 – 2017-08-25 (×2): 250 mL via INTRAVENOUS

## 2017-08-24 MED ORDER — MORPHINE SULFATE (PF) 2 MG/ML IV SOLN
2.0000 mg | INTRAVENOUS | Status: DC | PRN
  Administered 2017-08-25: 4 mg via INTRAVENOUS
  Administered 2017-08-25: 2 mg via INTRAVENOUS
  Administered 2017-08-25 (×3): 4 mg via INTRAVENOUS
  Filled 2017-08-24 (×3): qty 2
  Filled 2017-08-24: qty 1
  Filled 2017-08-24: qty 2

## 2017-08-24 MED ORDER — HEPARIN SODIUM (PORCINE) 1000 UNIT/ML IJ SOLN
INTRAMUSCULAR | Status: AC
  Filled 2017-08-24: qty 1

## 2017-08-24 MED ORDER — ACETAMINOPHEN 160 MG/5ML PO SOLN: 650.0000 mg | Freq: Once | ORAL | Status: AC

## 2017-08-24 MED ORDER — METOPROLOL TARTRATE 12.5 MG HALF TABLET
12.5000 mg | ORAL_TABLET | Freq: Two times a day (BID) | ORAL | Status: DC
  Administered 2017-08-25 – 2017-08-26 (×4): 12.5 mg via ORAL
  Filled 2017-08-24 (×5): qty 1

## 2017-08-24 MED ORDER — ORAL CARE MOUTH RINSE
15.0000 mL | Freq: Two times a day (BID) | OROMUCOSAL | Status: DC
  Administered 2017-08-25: 15 mL via OROMUCOSAL

## 2017-08-24 MED ORDER — PHENYLEPHRINE HCL-NACL 20-0.9 MG/250ML-% IV SOLN
0.0000 ug/min | INTRAVENOUS | Status: DC
  Filled 2017-08-24: qty 250

## 2017-08-24 MED ORDER — PHENYLEPHRINE 40 MCG/ML (10ML) SYRINGE FOR IV PUSH (FOR BLOOD PRESSURE SUPPORT)
PREFILLED_SYRINGE | INTRAVENOUS | Status: AC
  Filled 2017-08-24: qty 50

## 2017-08-24 MED ORDER — FENTANYL CITRATE (PF) 250 MCG/5ML IJ SOLN
INTRAMUSCULAR | Status: DC | PRN
  Administered 2017-08-24: 100 ug via INTRAVENOUS
  Administered 2017-08-24: 50 ug via INTRAVENOUS
  Administered 2017-08-24 (×2): 100 ug via INTRAVENOUS
  Administered 2017-08-24: 150 ug via INTRAVENOUS
  Administered 2017-08-24: 100 ug via INTRAVENOUS
  Administered 2017-08-24: 50 ug via INTRAVENOUS
  Administered 2017-08-24: 150 ug via INTRAVENOUS
  Administered 2017-08-24 (×2): 100 ug via INTRAVENOUS
  Administered 2017-08-24: 150 ug via INTRAVENOUS
  Administered 2017-08-24: 100 ug via INTRAVENOUS
  Administered 2017-08-24: 50 ug via INTRAVENOUS
  Administered 2017-08-24: 150 ug via INTRAVENOUS
  Administered 2017-08-24: 50 ug via INTRAVENOUS

## 2017-08-24 MED ORDER — NITROGLYCERIN 0.2 MG/ML ON CALL CATH LAB
INTRAVENOUS | Status: DC | PRN
  Administered 2017-08-24 (×4): 20 ug via INTRAVENOUS

## 2017-08-24 MED ORDER — FAMOTIDINE IN NACL 20-0.9 MG/50ML-% IV SOLN
20.0000 mg | Freq: Two times a day (BID) | INTRAVENOUS | Status: AC
  Administered 2017-08-24 (×2): 20 mg via INTRAVENOUS
  Filled 2017-08-24: qty 50

## 2017-08-24 MED ORDER — SODIUM CHLORIDE 0.9 % IV SOLN
INTRAVENOUS | Status: AC
  Administered 2017-08-24: 9 [IU]/h via INTRAVENOUS
  Administered 2017-08-24: 6.5 [IU]/h via INTRAVENOUS
  Filled 2017-08-24 (×2): qty 1

## 2017-08-24 MED ORDER — SODIUM CHLORIDE 0.9 % IV SOLN
INTRAVENOUS | Status: DC | PRN
  Administered 2017-08-24: 13:00:00 via INTRAVENOUS

## 2017-08-24 MED ORDER — SODIUM CHLORIDE 0.9 % IJ SOLN
INTRAMUSCULAR | Status: AC
  Filled 2017-08-24: qty 30

## 2017-08-24 MED ORDER — DEXMEDETOMIDINE HCL IN NACL 400 MCG/100ML IV SOLN
0.4000 ug/kg/h | INTRAVENOUS | Status: DC
  Administered 2017-08-24 (×2): 1.2 ug/kg/h via INTRAVENOUS
  Administered 2017-08-24: 0.5 ug/kg/h via INTRAVENOUS
  Administered 2017-08-24: 1 ug/kg/h via INTRAVENOUS
  Administered 2017-08-25: 0.8 ug/kg/h via INTRAVENOUS
  Filled 2017-08-24 (×4): qty 100

## 2017-08-24 MED ORDER — SODIUM CHLORIDE 0.9 % IV SOLN
INTRAVENOUS | Status: DC | PRN
  Administered 2017-08-24: 750 mg via INTRAVENOUS

## 2017-08-24 MED ORDER — VECURONIUM BROMIDE 10 MG IV SOLR
INTRAVENOUS | Status: DC | PRN
  Administered 2017-08-24 (×4): 5 mg via INTRAVENOUS

## 2017-08-24 MED ORDER — HEMOSTATIC AGENTS (NO CHARGE) OPTIME
TOPICAL | Status: DC | PRN
  Administered 2017-08-24: 2 via TOPICAL
  Administered 2017-08-24 (×2): 1 via TOPICAL
  Administered 2017-08-24: 3 via TOPICAL

## 2017-08-24 MED ORDER — TRAMADOL HCL 50 MG PO TABS
50.0000 mg | ORAL_TABLET | ORAL | Status: DC | PRN
  Administered 2017-08-25 – 2017-09-02 (×10): 100 mg via ORAL
  Filled 2017-08-24 (×11): qty 2

## 2017-08-24 MED ORDER — MAGNESIUM SULFATE 4 GM/100ML IV SOLN
4.0000 g | Freq: Once | INTRAVENOUS | Status: AC
  Administered 2017-08-24: 4 g via INTRAVENOUS
  Filled 2017-08-24: qty 100

## 2017-08-24 MED ORDER — SODIUM CHLORIDE 0.9% FLUSH
10.0000 mL | INTRAVENOUS | Status: DC | PRN
  Administered 2017-09-01: 10 mL
  Filled 2017-08-24: qty 40

## 2017-08-24 MED ORDER — VANCOMYCIN HCL IN DEXTROSE 1-5 GM/200ML-% IV SOLN
1000.0000 mg | Freq: Two times a day (BID) | INTRAVENOUS | Status: AC
  Administered 2017-08-24 – 2017-08-25 (×2): 1000 mg via INTRAVENOUS
  Filled 2017-08-24 (×2): qty 200

## 2017-08-24 MED ORDER — POTASSIUM CHLORIDE 10 MEQ/50ML IV SOLN
10.0000 meq | INTRAVENOUS | Status: AC
  Administered 2017-08-24 (×3): 10 meq via INTRAVENOUS

## 2017-08-24 MED ORDER — SUCCINYLCHOLINE CHLORIDE 200 MG/10ML IV SOSY
PREFILLED_SYRINGE | INTRAVENOUS | Status: DC | PRN
  Administered 2017-08-24: 140 mg via INTRAVENOUS

## 2017-08-24 MED ORDER — OXYCODONE HCL 5 MG PO TABS
5.0000 mg | ORAL_TABLET | ORAL | Status: DC | PRN
  Administered 2017-08-25 – 2017-09-02 (×35): 10 mg via ORAL
  Filled 2017-08-24 (×35): qty 2

## 2017-08-24 MED ORDER — ACETAMINOPHEN 500 MG PO TABS
1000.0000 mg | ORAL_TABLET | Freq: Four times a day (QID) | ORAL | Status: AC
  Administered 2017-08-25 – 2017-08-29 (×18): 1000 mg via ORAL
  Filled 2017-08-24 (×18): qty 2

## 2017-08-24 MED ORDER — SODIUM CHLORIDE 0.9 % IV SOLN
1.5000 g | Freq: Two times a day (BID) | INTRAVENOUS | Status: AC
  Administered 2017-08-24 – 2017-08-26 (×4): 1.5 g via INTRAVENOUS
  Filled 2017-08-24 (×4): qty 1.5

## 2017-08-24 MED ORDER — NITROGLYCERIN IN D5W 200-5 MCG/ML-% IV SOLN: 0.0000 ug/min | INTRAVENOUS | Status: DC

## 2017-08-24 MED ORDER — MIDAZOLAM HCL 5 MG/5ML IJ SOLN
INTRAMUSCULAR | Status: DC | PRN
  Administered 2017-08-24: 2 mg via INTRAVENOUS
  Administered 2017-08-24: 1 mg via INTRAVENOUS
  Administered 2017-08-24: 2 mg via INTRAVENOUS
  Administered 2017-08-24: 3 mg via INTRAVENOUS
  Administered 2017-08-24: 2 mg via INTRAVENOUS

## 2017-08-24 MED ORDER — LACTATED RINGERS IV SOLN: 500.0000 mL | Freq: Once | INTRAVENOUS | Status: DC | PRN

## 2017-08-24 MED ORDER — ACETAMINOPHEN 160 MG/5ML PO SOLN
1000.0000 mg | Freq: Four times a day (QID) | ORAL | Status: DC
  Administered 2017-08-24 – 2017-08-25 (×2): 1000 mg
  Filled 2017-08-24 (×2): qty 40.6

## 2017-08-24 MED ORDER — AMIODARONE HCL IN DEXTROSE 360-4.14 MG/200ML-% IV SOLN
60.0000 mg/h | INTRAVENOUS | Status: DC
  Administered 2017-08-24: 60 mg/h via INTRAVENOUS
  Filled 2017-08-24: qty 200

## 2017-08-24 MED ORDER — AMIODARONE HCL IN DEXTROSE 360-4.14 MG/200ML-% IV SOLN
30.0000 mg/h | INTRAVENOUS | Status: DC
  Administered 2017-08-24: 30 mg/h via INTRAVENOUS
  Filled 2017-08-24: qty 200

## 2017-08-24 MED ORDER — VANCOMYCIN HCL IN DEXTROSE 1-5 GM/200ML-% IV SOLN
1000.0000 mg | Freq: Once | INTRAVENOUS | Status: DC
  Filled 2017-08-24: qty 200

## 2017-08-24 MED ORDER — ARTIFICIAL TEARS OPHTHALMIC OINT
TOPICAL_OINTMENT | OPHTHALMIC | Status: DC | PRN
  Administered 2017-08-24: 1 via OPHTHALMIC

## 2017-08-24 MED ORDER — SODIUM CHLORIDE 0.9% FLUSH
3.0000 mL | INTRAVENOUS | Status: DC | PRN
  Administered 2017-09-01: 3 mL via INTRAVENOUS
  Filled 2017-08-24: qty 3

## 2017-08-24 MED ORDER — AMIODARONE LOAD VIA INFUSION
150.0000 mg | Freq: Once | INTRAVENOUS | Status: AC
  Administered 2017-08-24: 150 mg via INTRAVENOUS
  Filled 2017-08-24: qty 83.34

## 2017-08-24 MED ORDER — ASPIRIN EC 325 MG PO TBEC
325.0000 mg | DELAYED_RELEASE_TABLET | Freq: Every day | ORAL | Status: DC
  Administered 2017-08-25 – 2017-08-26 (×2): 325 mg via ORAL
  Filled 2017-08-24 (×2): qty 1

## 2017-08-24 MED ORDER — PANTOPRAZOLE SODIUM 40 MG PO TBEC
40.0000 mg | DELAYED_RELEASE_TABLET | Freq: Every day | ORAL | Status: DC
  Administered 2017-08-26 – 2017-09-02 (×8): 40 mg via ORAL
  Filled 2017-08-24 (×8): qty 1

## 2017-08-24 MED ORDER — FENTANYL CITRATE (PF) 250 MCG/5ML IJ SOLN
INTRAMUSCULAR | Status: AC
  Filled 2017-08-24: qty 25

## 2017-08-24 MED ORDER — DOCUSATE SODIUM 100 MG PO CAPS
200.0000 mg | ORAL_CAPSULE | Freq: Every day | ORAL | Status: DC
  Administered 2017-08-25 – 2017-09-02 (×9): 200 mg via ORAL
  Filled 2017-08-24 (×9): qty 2

## 2017-08-24 MED ORDER — METOPROLOL TARTRATE 5 MG/5ML IV SOLN
2.5000 mg | INTRAVENOUS | Status: DC | PRN
  Administered 2017-08-27 – 2017-08-28 (×2): 5 mg via INTRAVENOUS
  Filled 2017-08-24 (×2): qty 5

## 2017-08-24 MED ORDER — MORPHINE SULFATE (PF) 2 MG/ML IV SOLN
1.0000 mg | INTRAVENOUS | Status: AC | PRN
  Administered 2017-08-24 – 2017-08-25 (×4): 4 mg via INTRAVENOUS
  Filled 2017-08-24 (×4): qty 2

## 2017-08-24 MED ORDER — VECURONIUM BROMIDE 10 MG IV SOLR
INTRAVENOUS | Status: AC
  Filled 2017-08-24: qty 10

## 2017-08-24 MED ORDER — ACETAMINOPHEN 650 MG RE SUPP
650.0000 mg | Freq: Once | RECTAL | Status: AC
  Administered 2017-08-24: 650 mg via RECTAL

## 2017-08-24 MED ORDER — FENTANYL CITRATE (PF) 250 MCG/5ML IJ SOLN
INTRAMUSCULAR | Status: AC
Start: 2017-08-24 — End: ?
  Filled 2017-08-24: qty 5

## 2017-08-24 MED ORDER — SODIUM CHLORIDE 0.9 % IV SOLN: 250.0000 mL | INTRAVENOUS | Status: DC

## 2017-08-24 MED ORDER — PROPOFOL 10 MG/ML IV BOLUS
INTRAVENOUS | Status: AC
  Filled 2017-08-24: qty 20

## 2017-08-24 MED ORDER — PROTAMINE SULFATE 10 MG/ML IV SOLN
INTRAVENOUS | Status: DC | PRN
  Administered 2017-08-24: 350 mg via INTRAVENOUS

## 2017-08-24 MED ORDER — PHENYLEPHRINE 40 MCG/ML (10ML) SYRINGE FOR IV PUSH (FOR BLOOD PRESSURE SUPPORT)
PREFILLED_SYRINGE | INTRAVENOUS | Status: AC
  Filled 2017-08-24: qty 10

## 2017-08-24 MED ORDER — ROCURONIUM BROMIDE 50 MG/5ML IV SOSY
PREFILLED_SYRINGE | INTRAVENOUS | Status: AC
  Filled 2017-08-24: qty 15

## 2017-08-24 MED ORDER — PROPOFOL 10 MG/ML IV BOLUS
INTRAVENOUS | Status: DC | PRN
  Administered 2017-08-24: 100 mg via INTRAVENOUS
  Administered 2017-08-24: 20 mg via INTRAVENOUS
  Administered 2017-08-24: 50 mg via INTRAVENOUS

## 2017-08-24 MED ORDER — ONDANSETRON HCL 4 MG/2ML IJ SOLN: 4.0000 mg | Freq: Four times a day (QID) | INTRAMUSCULAR | Status: DC | PRN

## 2017-08-24 MED ORDER — ROCURONIUM BROMIDE 10 MG/ML (PF) SYRINGE
PREFILLED_SYRINGE | INTRAVENOUS | Status: DC | PRN
  Administered 2017-08-24 (×3): 50 mg via INTRAVENOUS

## 2017-08-24 MED ORDER — CALCIUM CHLORIDE 10 % IV SOLN
INTRAVENOUS | Status: DC | PRN
  Administered 2017-08-24 (×3): 200 mg via INTRAVENOUS

## 2017-08-24 MED ORDER — LACTATED RINGERS IV SOLN
INTRAVENOUS | Status: DC
  Administered 2017-08-24: 15:00:00 via INTRAVENOUS

## 2017-08-24 MED ORDER — ARTIFICIAL TEARS OPHTHALMIC OINT
TOPICAL_OINTMENT | OPHTHALMIC | Status: AC
  Filled 2017-08-24: qty 3.5

## 2017-08-24 MED ORDER — ASPIRIN 81 MG PO CHEW: 324.0000 mg | CHEWABLE_TABLET | Freq: Every day | ORAL | Status: DC

## 2017-08-24 MED ORDER — CHLORHEXIDINE GLUCONATE 0.12 % MT SOLN
15.0000 mL | Freq: Two times a day (BID) | OROMUCOSAL | Status: DC
  Administered 2017-08-24 – 2017-08-26 (×3): 15 mL via OROMUCOSAL
  Filled 2017-08-24 (×2): qty 15

## 2017-08-24 MED ORDER — MILRINONE LACTATE IN DEXTROSE 20-5 MG/100ML-% IV SOLN
0.3000 ug/kg/min | INTRAVENOUS | Status: DC
  Administered 2017-08-24: 0.25 ug/kg/min via INTRAVENOUS
  Filled 2017-08-24: qty 100

## 2017-08-24 MED ORDER — DEXMEDETOMIDINE HCL IN NACL 200 MCG/50ML IV SOLN
INTRAVENOUS | Status: AC
  Filled 2017-08-24: qty 50

## 2017-08-24 MED ORDER — MIDAZOLAM HCL 10 MG/2ML IJ SOLN
INTRAMUSCULAR | Status: AC
  Filled 2017-08-24: qty 2

## 2017-08-24 MED ORDER — MOMETASONE FURO-FORMOTEROL FUM 200-5 MCG/ACT IN AERO
2.0000 | INHALATION_SPRAY | Freq: Two times a day (BID) | RESPIRATORY_TRACT | Status: DC
  Administered 2017-08-25 – 2017-09-02 (×16): 2 via RESPIRATORY_TRACT
  Filled 2017-08-24: qty 8.8

## 2017-08-24 MED ORDER — MIDAZOLAM HCL 2 MG/2ML IJ SOLN: 2.0000 mg | INTRAMUSCULAR | Status: DC | PRN

## 2017-08-24 SURGICAL SUPPLY — 97 items
ADAPTER CARDIO PERF ANTE/RETRO (ADAPTER) ×4 IMPLANT
BAG DECANTER FOR FLEXI CONT (MISCELLANEOUS) ×4 IMPLANT
BANDAGE ACE 4X5 VEL STRL LF (GAUZE/BANDAGES/DRESSINGS) ×4 IMPLANT
BANDAGE ACE 6X5 VEL STRL LF (GAUZE/BANDAGES/DRESSINGS) ×4 IMPLANT
BASKET HEART  (ORDER IN 25'S) (MISCELLANEOUS) ×1
BASKET HEART (ORDER IN 25'S) (MISCELLANEOUS) ×1
BASKET HEART (ORDER IN 25S) (MISCELLANEOUS) ×2 IMPLANT
BLADE CLIPPER SURG (BLADE) IMPLANT
BLADE STERNUM SYSTEM 6 (BLADE) ×4 IMPLANT
BLADE SURG 12 STRL SS (BLADE) ×4 IMPLANT
BNDG GAUZE ELAST 4 BULKY (GAUZE/BANDAGES/DRESSINGS) ×4 IMPLANT
CANISTER SUCT 3000ML PPV (MISCELLANEOUS) ×4 IMPLANT
CANNULA ARTERIAL NVNT 3/8 22FR (MISCELLANEOUS) ×4 IMPLANT
CANNULA GUNDRY RCSP 15FR (MISCELLANEOUS) ×4 IMPLANT
CATH CPB KIT VANTRIGT (MISCELLANEOUS) ×4 IMPLANT
CATH ROBINSON RED A/P 18FR (CATHETERS) ×12 IMPLANT
CATH THORACIC 28FR RT ANG (CATHETERS) ×4 IMPLANT
CATH THORACIC 36FR RT ANG (CATHETERS) ×8 IMPLANT
CLIP RETRACTION 3.0MM CORONARY (MISCELLANEOUS) ×4 IMPLANT
CLIP VESOCCLUDE SM WIDE 24/CT (CLIP) ×4 IMPLANT
CRADLE DONUT ADULT HEAD (MISCELLANEOUS) ×4 IMPLANT
DERMABOND ADVANCED (GAUZE/BANDAGES/DRESSINGS) ×2
DERMABOND ADVANCED .7 DNX12 (GAUZE/BANDAGES/DRESSINGS) ×2 IMPLANT
DRAIN CHANNEL 32F RND 10.7 FF (WOUND CARE) ×4 IMPLANT
DRAPE CARDIOVASCULAR INCISE (DRAPES) ×2
DRAPE SLUSH/WARMER DISC (DRAPES) ×4 IMPLANT
DRAPE SRG 135X102X78XABS (DRAPES) ×2 IMPLANT
DRSG AQUACEL AG ADV 3.5X14 (GAUZE/BANDAGES/DRESSINGS) ×4 IMPLANT
ELECT BLADE 4.0 EZ CLEAN MEGAD (MISCELLANEOUS) ×4
ELECT BLADE 6.5 EXT (BLADE) ×4 IMPLANT
ELECT CAUTERY BLADE 6.4 (BLADE) ×4 IMPLANT
ELECT REM PT RETURN 9FT ADLT (ELECTROSURGICAL) ×8
ELECTRODE BLDE 4.0 EZ CLN MEGD (MISCELLANEOUS) ×2 IMPLANT
ELECTRODE REM PT RTRN 9FT ADLT (ELECTROSURGICAL) ×4 IMPLANT
FELT TEFLON 1X6 (MISCELLANEOUS) ×8 IMPLANT
GAUZE SPONGE 4X4 12PLY STRL (GAUZE/BANDAGES/DRESSINGS) ×4 IMPLANT
GLOVE BIO SURGEON STRL SZ7.5 (GLOVE) ×12 IMPLANT
GOWN STRL REUS W/ TWL LRG LVL3 (GOWN DISPOSABLE) ×8 IMPLANT
GOWN STRL REUS W/TWL LRG LVL3 (GOWN DISPOSABLE) ×8
HEMOSTAT POWDER SURGIFOAM 1G (HEMOSTASIS) ×8 IMPLANT
HEMOSTAT SURGICEL 2X14 (HEMOSTASIS) ×4 IMPLANT
INSERT FOGARTY XLG (MISCELLANEOUS) ×4 IMPLANT
IV CATH 22GX1 FEP (IV SOLUTION) ×4 IMPLANT
KIT BASIN OR (CUSTOM PROCEDURE TRAY) ×4 IMPLANT
KIT SUCTION CATH 14FR (SUCTIONS) ×4 IMPLANT
KIT TURNOVER KIT B (KITS) ×4 IMPLANT
KIT VASOVIEW HEMOPRO VH 3000 (KITS) ×4 IMPLANT
LEAD PACING MYOCARDI (MISCELLANEOUS) ×4 IMPLANT
MARKER GRAFT CORONARY BYPASS (MISCELLANEOUS) ×12 IMPLANT
NS IRRIG 1000ML POUR BTL (IV SOLUTION) ×20 IMPLANT
PACK E OPEN HEART (SUTURE) ×4 IMPLANT
PACK OPEN HEART (CUSTOM PROCEDURE TRAY) ×4 IMPLANT
PAD ARMBOARD 7.5X6 YLW CONV (MISCELLANEOUS) ×8 IMPLANT
PAD ELECT DEFIB RADIOL ZOLL (MISCELLANEOUS) ×4 IMPLANT
PENCIL BUTTON HOLSTER BLD 10FT (ELECTRODE) ×4 IMPLANT
POWDER SURGICEL 3.0 GRAM (HEMOSTASIS) ×12 IMPLANT
PUNCH AORTIC ROTATE  4.5MM 8IN (MISCELLANEOUS) ×4 IMPLANT
PUNCH AORTIC ROTATE 4.0MM (MISCELLANEOUS) IMPLANT
PUNCH AORTIC ROTATE 4.5MM 8IN (MISCELLANEOUS) IMPLANT
PUNCH AORTIC ROTATE 5MM 8IN (MISCELLANEOUS) ×4 IMPLANT
SET CARDIOPLEGIA MPS 5001102 (MISCELLANEOUS) ×4 IMPLANT
SOLUTION ANTI FOG 6CC (MISCELLANEOUS) ×4 IMPLANT
SPONGE LAP 18X18 X RAY DECT (DISPOSABLE) ×8 IMPLANT
SPONGE LAP 4X18 RFD (DISPOSABLE) ×4 IMPLANT
SURGIFLO W/THROMBIN 8M KIT (HEMOSTASIS) ×8 IMPLANT
SUT BONE WAX W31G (SUTURE) ×4 IMPLANT
SUT MNCRL AB 4-0 PS2 18 (SUTURE) IMPLANT
SUT PROLENE 3 0 SH DA (SUTURE) ×8 IMPLANT
SUT PROLENE 3 0 SH1 36 (SUTURE) IMPLANT
SUT PROLENE 4 0 RB 1 (SUTURE) ×2
SUT PROLENE 4 0 SH DA (SUTURE) ×8 IMPLANT
SUT PROLENE 4-0 RB1 .5 CRCL 36 (SUTURE) ×2 IMPLANT
SUT PROLENE 5 0 C 1 36 (SUTURE) IMPLANT
SUT PROLENE 6 0 C 1 30 (SUTURE) ×8 IMPLANT
SUT PROLENE 6 0 CC (SUTURE) ×16 IMPLANT
SUT PROLENE 8 0 BV175 6 (SUTURE) IMPLANT
SUT PROLENE BLUE 7 0 (SUTURE) ×8 IMPLANT
SUT SILK  1 MH (SUTURE)
SUT SILK 1 MH (SUTURE) IMPLANT
SUT SILK 2 0 SH CR/8 (SUTURE) ×8 IMPLANT
SUT SILK 3 0 SH CR/8 (SUTURE) IMPLANT
SUT STEEL 6MS V (SUTURE) ×4 IMPLANT
SUT STEEL SZ 6 DBL 3X14 BALL (SUTURE) ×8 IMPLANT
SUT VIC AB 1 CTX 36 (SUTURE) ×4
SUT VIC AB 1 CTX36XBRD ANBCTR (SUTURE) ×4 IMPLANT
SUT VIC AB 2-0 CT1 27 (SUTURE)
SUT VIC AB 2-0 CT1 TAPERPNT 27 (SUTURE) IMPLANT
SUT VIC AB 2-0 CTX 27 (SUTURE) IMPLANT
SUT VIC AB 3-0 X1 27 (SUTURE) IMPLANT
SYSTEM SAHARA CHEST DRAIN ATS (WOUND CARE) ×4 IMPLANT
TAPE CLOTH SURG 4X10 WHT LF (GAUZE/BANDAGES/DRESSINGS) ×4 IMPLANT
TOWEL GREEN STERILE (TOWEL DISPOSABLE) ×4 IMPLANT
TOWEL GREEN STERILE FF (TOWEL DISPOSABLE) ×4 IMPLANT
TRAY FOLEY SLVR 16FR TEMP STAT (SET/KITS/TRAYS/PACK) ×4 IMPLANT
TUBING INSUFFLATION (TUBING) ×4 IMPLANT
UNDERPAD 30X30 (UNDERPADS AND DIAPERS) ×4 IMPLANT
WATER STERILE IRR 1000ML POUR (IV SOLUTION) ×8 IMPLANT

## 2017-08-24 NOTE — Anesthesia Procedure Notes (Addendum)
Central Venous Catheter Insertion Performed by: Effie Berkshire, MD, anesthesiologist Start/End8/19/2019 6:45 AM, 08/24/2017 7:00 AM Patient location: Pre-op. Preanesthetic checklist: patient identified, IV checked, site marked, risks and benefits discussed, surgical consent, monitors and equipment checked, pre-op evaluation, timeout performed and anesthesia consent Lidocaine 1% used for infiltration and patient sedated Hand hygiene performed  and maximum sterile barriers used  Catheter size: 9 Fr MAC introducer Procedure performed using ultrasound guided technique. Ultrasound Notes:anatomy identified, needle tip was noted to be adjacent to the nerve/plexus identified, no ultrasound evidence of intravascular and/or intraneural injection and image(s) printed for medical record Attempts: 1 Following insertion, line sutured and dressing applied. Post procedure assessment: blood return through all ports, free fluid flow and no air  Patient tolerated the procedure well with no immediate complications.

## 2017-08-24 NOTE — Transfer of Care (Signed)
Immediate Anesthesia Transfer of Care Note  Patient: Paul Todd  Procedure(s) Performed: CORONARY ARTERY BYPASS GRAFTING (CABG) X3. RIGHT ENDOSCOPIC SAPHENOUS VEIN HARVEST. MAMM TO LAD, SVG TO OM, SVG TO RAMUS (N/A Chest) TRANSESOPHAGEAL ECHOCARDIOGRAM (TEE) (N/A )  Patient Location: ICU  Anesthesia Type:General  Level of Consciousness: Patient remains intubated per anesthesia plan  Airway & Oxygen Therapy: Patient remains intubated per anesthesia plan and Patient placed on Ventilator (see vital sign flow sheet for setting)  Post-op Assessment: Report given to RN and Post -op Vital signs reviewed and stable  Post vital signs: Reviewed and stable  Last Vitals:  Vitals Value Taken Time  BP    Temp 35.9 C 08/24/2017  2:43 PM  Pulse 88 08/24/2017  2:43 PM  Resp 23 08/24/2017  2:43 PM  SpO2 98 % 08/24/2017  2:43 PM  Vitals shown include unvalidated device data.  Last Pain:  Vitals:   08/24/17 0448  TempSrc: Oral  PainSc:       Patients Stated Pain Goal: 0 (99/77/41 4239)  Complications: No apparent anesthesia complications

## 2017-08-24 NOTE — Progress Notes (Signed)
      [address]       [place] [address]             [phone]      S/p CABG x 3  Intubated, sedated  BP 99/69   Pulse 89   Temp 97.9 F (36.6 C)   Resp 17   Ht 5\' 7"  (1.702 m)   Wt 118.6 kg   SpO2 100%   BMI 40.96 kg/m   Intake/Output Summary (Last 24 hours) at 08/24/2017 1827 Last data filed at 08/24/2017 1735 Gross per 24 hour  Intake 4852.9 ml  Output 2335 ml  Net 2517.9 ml   CI 2.5 on milrinone, levophed  CBG OK  Doing well early postop  Remo Lipps C. Roxan Hockey, MD [facility] [phone]

## 2017-08-24 NOTE — Progress Notes (Signed)
  Echocardiogram Echocardiogram Transesophageal has been performed.  Paul Todd 08/24/2017, 9:42 AM

## 2017-08-24 NOTE — Anesthesia Procedure Notes (Addendum)
Central Venous Catheter Insertion Performed by: Effie Berkshire, MD, anesthesiologist Start/End8/19/2019 7:00 AM, 08/24/2017 7:05 AM Patient location: Pre-op. Preanesthetic checklist: patient identified, IV checked, site marked, risks and benefits discussed, surgical consent, monitors and equipment checked, pre-op evaluation, timeout performed and anesthesia consent Hand hygiene performed  and maximum sterile barriers used  PA cath was placed.[place]wan type:thermodilution Procedure performed without using ultrasound guided technique. Attempts: 1 Patient tolerated the procedure well with no immediate complications. Additional procedure comments: Monitors not correlating with PA cath. Marland Kitchen

## 2017-08-24 NOTE — Progress Notes (Signed)
Patient interviewed in the preoperative area. Patient able to confirm name, DOB, procedure, allergies, no metal in body, no pain and npo after midnight. Patient able to move over to OR bed with minimal assistance.   Leatha Gilding, RN

## 2017-08-24 NOTE — Plan of Care (Signed)
Patient remains sedated and on the ventilator.  Opens eyes, tracks, follows commands.  Orders to remain on the ventilator over night, and to prepare to extubate in the Am.  Spoke with wife via telephone and update was given on status.  Monitoring as per protocol.

## 2017-08-24 NOTE — Anesthesia Procedure Notes (Signed)
Arterial Line Insertion Start/End8/19/2019 6:55 AM, 08/24/2017 7:00 AM Performed by: Colin Benton, CRNA, CRNA  Patient location: Pre-op. Preanesthetic checklist: patient identified, IV checked, site marked, risks and benefits discussed, surgical consent, monitors and equipment checked, pre-op evaluation, timeout performed and anesthesia consent Lidocaine 1% used for infiltration Left, radial was placed Catheter size: 20 G Hand hygiene performed , maximum sterile barriers used  and Seldinger technique used Allen's test indicative of satisfactory collateral circulation Attempts: 1 Procedure performed without using ultrasound guided technique. Following insertion, dressing applied and Biopatch. Post procedure assessment: normal and unchanged  Patient tolerated the procedure well with no immediate complications.

## 2017-08-24 NOTE — Brief Op Note (Signed)
08/20/2017 - 08/24/2017  8:39 AM  PATIENT:  Paul Todd  57 y.o. male  PRE-OPERATIVE DIAGNOSIS:  CAD  POST-OPERATIVE DIAGNOSIS:  * No post-op diagnosis entered *  PROCEDURE:  Procedure(s):  CORONARY ARTERY BYPASS GRAFTING (CABG) X3 -LIMA to LAD -SVG to OM -SVG to RAMUS INTERMEDIATE  ENDOSCOPIC HARVEST GREATER SAPHENOUS VEIN -Right Leg  TRANSESOPHAGEAL ECHOCARDIOGRAM (TEE) (N/A)  SURGEON:  Surgeon(s) and Role:    Ivin Poot, MD - Primary  PHYSICIAN ASSISTANT: [name] PA-C  ANESTHESIA:   general  EBL:  610 mL   BLOOD ADMINISTERED: CELLSAVER and 2 FFP  DRAINS: Left Pleural Chest Tubes, Mediastinal Chest Drains   LOCAL MEDICATIONS USED:  NONE  SPECIMEN:  No Specimen  DISPOSITION OF SPECIMEN:  N/A  COUNTS:  YES  TOURNIQUET:  * No tourniquets in log *  DICTATION: .Dragon Dictation  PLAN OF CARE: Admit to inpatient   PATIENT DISPOSITION:  ICU - intubated and hemodynamically stable.   Delay start of Pharmacological VTE agent (>24hrs) due to surgical blood loss or risk of bleeding: yes

## 2017-08-24 NOTE — Anesthesia Procedure Notes (Signed)
Procedure Name: Intubation Date/Time: 08/24/2017 8:16 AM Performed by: Colin Benton, CRNA Pre-anesthesia Checklist: Patient identified, Emergency Drugs available, Suction available and Patient being monitored Patient Re-evaluated:Patient Re-evaluated prior to induction Oxygen Delivery Method: Circle system utilized Preoxygenation: Pre-oxygenation with 100% oxygen Induction Type: IV induction and Rapid sequence Laryngoscope Size: Glidescope and 4 Grade View: Grade I Tube type: Oral Tube size: 8.0 mm Number of attempts: 1 Airway Equipment and Method: Video-laryngoscopy and Rigid stylet Placement Confirmation: ETT inserted through vocal cords under direct vision,  positive ETCO2 and breath sounds checked- equal and bilateral Secured at: 23 cm Tube secured with: Tape Dental Injury: Teeth and Oropharynx as per pre-operative assessment  Difficulty Due To: Difficulty was anticipated, Difficult Airway- due to large tongue and Difficult Airway- due to limited oral opening

## 2017-08-24 NOTE — Progress Notes (Signed)
Pre Procedure note for inpatients:   Paul Todd has been scheduled for Procedure(s): CORONARY ARTERY BYPASS GRAFTING (CABG) (N/A) TRANSESOPHAGEAL ECHOCARDIOGRAM (TEE) (N/A) today. The various methods of treatment have been discussed with the patient. After consideration of the risks, benefits and treatment options the patient has consented to the planned procedure.   The patient has been seen and labs reviewed. There are no changes in the patient's condition to prevent proceeding with the planned procedure today.  Recent labs:  Lab Results  Component Value Date   WBC 10.1 08/24/2017   HGB 14.1 08/24/2017   HCT 44.6 08/24/2017   PLT 185 08/24/2017   GLUCOSE 123 (H) 08/22/2017   CHOL 195 01/28/2013   TRIG 206 (H) 01/28/2013   HDL 43 01/28/2013   LDLCALC 111 (H) 01/28/2013   ALT 41 08/22/2017   AST 29 08/22/2017   NA 141 08/22/2017   K 4.0 08/22/2017   CL 112 (H) 08/22/2017   CREATININE 0.86 08/22/2017   BUN 12 08/22/2017   CO2 23 08/22/2017   TSH 0.819 08/22/2017   PSA 1.65 11/23/2012   INR 1.01 08/22/2017   HGBA1C 7.4 (H) 08/22/2017    Len Childs, MD 08/24/2017 7:02 AM

## 2017-08-24 NOTE — Progress Notes (Signed)
Pre CABG report called to Bruno in Anesthesia. Pt has remained CP free, received Metoprolol 12.5 mg and Valium 5mg . Heparin gtt off at 0540. Jessie Foot, RN

## 2017-08-24 NOTE — Anesthesia Postprocedure Evaluation (Signed)
Anesthesia Post Note  Patient: Paul Todd  Procedure(s) Performed: CORONARY ARTERY BYPASS GRAFTING (CABG) X3. RIGHT ENDOSCOPIC SAPHENOUS VEIN HARVEST. MAMM TO LAD, SVG TO OM, SVG TO RAMUS (N/A Chest) TRANSESOPHAGEAL ECHOCARDIOGRAM (TEE) (N/A )     Patient location during evaluation: PACU Anesthesia Type: General Level of consciousness: patient remains intubated per anesthesia plan Pain management: pain level controlled Vital Signs Assessment: post-procedure vital signs reviewed and stable Respiratory status: patient remains intubated per anesthesia plan Cardiovascular status: stable Postop Assessment: adequate PO intake Anesthetic complications: no    Last Vitals:  Vitals:   08/24/17 1615 08/24/17 1630  BP:    Pulse: 88 88  Resp: (!) 29 (!) 31  Temp: (!) 36.1 C (!) 36.2 C  SpO2: 100% 100%    Last Pain:  Vitals:   08/24/17 1500  TempSrc: Core (Comment)  PainSc:                  Paul Todd

## 2017-08-25 ENCOUNTER — Encounter (HOSPITAL_COMMUNITY): Payer: Self-pay | Admitting: Cardiothoracic Surgery

## 2017-08-25 ENCOUNTER — Inpatient Hospital Stay (HOSPITAL_COMMUNITY): Payer: BLUE CROSS/BLUE SHIELD

## 2017-08-25 DIAGNOSIS — Z951 Presence of aortocoronary bypass graft: Secondary | ICD-10-CM

## 2017-08-25 LAB — PREPARE FRESH FROZEN PLASMA
Unit division: 0
Unit division: 0

## 2017-08-25 LAB — POCT I-STAT 3, ART BLOOD GAS (G3+)
Acid-base deficit: 3 mmol/L — ABNORMAL HIGH (ref 0.0–2.0)
Acid-base deficit: 2 mmol/L (ref 0.0–2.0)
Bicarbonate: 25.2 mmol/L (ref 20.0–28.0)
Bicarbonate: 22.2 mmol/L (ref 20.0–28.0)
Bicarbonate: 22.4 mmol/L (ref 20.0–28.0)
O2 Saturation: 99 %
O2 Saturation: 94 %
O2 Saturation: 90 %
Patient temperature: 36.8
Patient temperature: 36.6
Patient temperature: 98.1
TCO2: 27 mmol/L (ref 22–32)
TCO2: 23 mmol/L (ref 22–32)
TCO2: 24 mmol/L (ref 22–32)
pCO2 arterial: 43.4 mmHg (ref 32.0–48.0)
pCO2 arterial: 39.5 mmHg (ref 32.0–48.0)
pCO2 arterial: 37.2 mmHg (ref 32.0–48.0)
pH, Arterial: 7.372 (ref 7.350–7.450)
pH, Arterial: 7.355 (ref 7.350–7.450)
pH, Arterial: 7.387 (ref 7.350–7.450)
pO2, Arterial: 119 mmHg — ABNORMAL HIGH (ref 83.0–108.0)
pO2, Arterial: 70 mmHg — ABNORMAL LOW (ref 83.0–108.0)
pO2, Arterial: 59 mmHg — ABNORMAL LOW (ref 83.0–108.0)

## 2017-08-25 LAB — CBC
HCT: 37.9 % — ABNORMAL LOW (ref 39.0–52.0)
HEMATOCRIT: 32.4 % — AB (ref 39.0–52.0)
HEMOGLOBIN: 10.3 g/dL — AB (ref 13.0–17.0)
HEMOGLOBIN: 11.9 g/dL — AB (ref 13.0–17.0)
MCH: 29.6 pg (ref 26.0–34.0)
MCH: 29.4 pg (ref 26.0–34.0)
MCHC: 31.8 g/dL (ref 30.0–36.0)
MCHC: 31.4 g/dL (ref 30.0–36.0)
MCV: 93.1 fL (ref 78.0–100.0)
MCV: 93.6 fL (ref 78.0–100.0)
PLATELETS: 135 10*3/uL — AB (ref 150–400)
Platelets: 120 10*3/uL — ABNORMAL LOW (ref 150–400)
RBC: 3.48 MIL/uL — ABNORMAL LOW (ref 4.22–5.81)
RBC: 4.05 MIL/uL — AB (ref 4.22–5.81)
RDW: 14.6 % (ref 11.5–15.5)
RDW: 14.9 % (ref 11.5–15.5)
WBC: 12.9 10*3/uL — AB (ref 4.0–10.5)
WBC: 18.1 10*3/uL — AB (ref 4.0–10.5)

## 2017-08-25 LAB — CREATININE, SERUM: CREATININE: 1 mg/dL (ref 0.61–1.24)

## 2017-08-25 LAB — GLUCOSE, CAPILLARY
Glucose-Capillary: 104 mg/dL — ABNORMAL HIGH (ref 70–99)
Glucose-Capillary: 109 mg/dL — ABNORMAL HIGH (ref 70–99)
Glucose-Capillary: 110 mg/dL — ABNORMAL HIGH (ref 70–99)
Glucose-Capillary: 111 mg/dL — ABNORMAL HIGH (ref 70–99)
Glucose-Capillary: 112 mg/dL — ABNORMAL HIGH (ref 70–99)
Glucose-Capillary: 118 mg/dL — ABNORMAL HIGH (ref 70–99)
Glucose-Capillary: 123 mg/dL — ABNORMAL HIGH (ref 70–99)
Glucose-Capillary: 130 mg/dL — ABNORMAL HIGH (ref 70–99)
Glucose-Capillary: 136 mg/dL — ABNORMAL HIGH (ref 70–99)
Glucose-Capillary: 137 mg/dL — ABNORMAL HIGH (ref 70–99)
Glucose-Capillary: 138 mg/dL — ABNORMAL HIGH (ref 70–99)
Glucose-Capillary: 138 mg/dL — ABNORMAL HIGH (ref 70–99)
Glucose-Capillary: 144 mg/dL — ABNORMAL HIGH (ref 70–99)
Glucose-Capillary: 149 mg/dL — ABNORMAL HIGH (ref 70–99)
Glucose-Capillary: 149 mg/dL — ABNORMAL HIGH (ref 70–99)
Glucose-Capillary: 156 mg/dL — ABNORMAL HIGH (ref 70–99)
Glucose-Capillary: 165 mg/dL — ABNORMAL HIGH (ref 70–99)

## 2017-08-25 LAB — BLOOD GAS, ARTERIAL
Acid-base deficit: 1.9 mmol/L (ref 0.0–2.0)
Bicarbonate: 22.8 mmol/L (ref 20.0–28.0)
FIO2: 50
MECHVT: 700 mL
O2 Saturation: 97.9 %
PEEP: 5 cmH2O
Patient temperature: 98.6
RATE: 14 resp/min
pCO2 arterial: 41.8 mmHg (ref 32.0–48.0)
pH, Arterial: 7.356 (ref 7.350–7.450)
pO2, Arterial: 127 mmHg — ABNORMAL HIGH (ref 83.0–108.0)

## 2017-08-25 LAB — POCT I-STAT, CHEM 8
BUN: 8 mg/dL (ref 6–20)
Calcium, Ion: 1.15 mmol/L (ref 1.15–1.40)
Chloride: 101 mmol/L (ref 98–111)
Creatinine, Ser: 0.8 mg/dL (ref 0.61–1.24)
Glucose, Bld: 158 mg/dL — ABNORMAL HIGH (ref 70–99)
HCT: 36 % — ABNORMAL LOW (ref 39.0–52.0)
Hemoglobin: 12.2 g/dL — ABNORMAL LOW (ref 13.0–17.0)
Potassium: 3.8 mmol/L (ref 3.5–5.1)
Sodium: 139 mmol/L (ref 135–145)
TCO2: 24 mmol/L (ref 22–32)

## 2017-08-25 LAB — BPAM FFP
Blood Product Expiration Date: 201908202359
Blood Product Expiration Date: 201908212359
ISSUE DATE / TIME: 201908191210
ISSUE DATE / TIME: 201908191210
Unit Type and Rh: 600
Unit Type and Rh: 6200

## 2017-08-25 LAB — BASIC METABOLIC PANEL
ANION GAP: 5 (ref 5–15)
BUN: 9 mg/dL (ref 6–20)
CHLORIDE: 111 mmol/L (ref 98–111)
CO2: 22 mmol/L (ref 22–32)
Calcium: 7.5 mg/dL — ABNORMAL LOW (ref 8.9–10.3)
Creatinine, Ser: 0.9 mg/dL (ref 0.61–1.24)
GFR calc non Af Amer: >60 mL/min (ref >60)
GLUCOSE: 124 mg/dL — AB (ref 70–99)
POTASSIUM: 4.2 mmol/L (ref 3.5–5.1)
Sodium: 138 mmol/L (ref 135–145)

## 2017-08-25 LAB — COOXEMETRY PANEL
Carboxyhemoglobin: 0.9 % (ref 0.5–1.5)
Carboxyhemoglobin: 1 % (ref 0.5–1.5)
Methemoglobin: 1.4 % (ref 0.0–1.5)
Methemoglobin: 2.2 % — ABNORMAL HIGH (ref 0.0–1.5)
O2 Saturation: 69.2 %
O2 Saturation: 74.4 %
Total hemoglobin: 10.3 g/dL — ABNORMAL LOW (ref 12.0–16.0)
Total hemoglobin: 10.8 g/dL — ABNORMAL LOW (ref 12.0–16.0)

## 2017-08-25 LAB — MAGNESIUM
Magnesium: 2.4 mg/dL (ref 1.7–2.4)
Magnesium: 2.2 mg/dL (ref 1.7–2.4)

## 2017-08-25 MED ORDER — INSULIN ASPART 100 UNIT/ML ~~LOC~~ SOLN
4.0000 [IU] | Freq: Three times a day (TID) | SUBCUTANEOUS | Status: DC
  Administered 2017-08-25 – 2017-09-02 (×17): 4 [IU] via SUBCUTANEOUS

## 2017-08-25 MED ORDER — AMIODARONE HCL 200 MG PO TABS
400.0000 mg | ORAL_TABLET | Freq: Two times a day (BID) | ORAL | Status: DC
  Administered 2017-08-25 – 2017-08-26 (×3): 400 mg via ORAL
  Filled 2017-08-25 (×3): qty 2

## 2017-08-25 MED ORDER — INSULIN DETEMIR 100 UNIT/ML ~~LOC~~ SOLN
15.0000 [IU] | Freq: Two times a day (BID) | SUBCUTANEOUS | Status: DC
  Administered 2017-08-25 – 2017-09-02 (×16): 15 [IU] via SUBCUTANEOUS
  Filled 2017-08-25 (×18): qty 0.15

## 2017-08-25 MED ORDER — INSULIN ASPART 100 UNIT/ML ~~LOC~~ SOLN
0.0000 [IU] | SUBCUTANEOUS | Status: DC
  Administered 2017-08-25 – 2017-08-26 (×6): 2 [IU] via SUBCUTANEOUS

## 2017-08-25 MED ORDER — KETOROLAC TROMETHAMINE 15 MG/ML IJ SOLN
15.0000 mg | Freq: Four times a day (QID) | INTRAMUSCULAR | Status: AC
  Administered 2017-08-25 – 2017-08-27 (×8): 15 mg via INTRAVENOUS
  Filled 2017-08-25 (×8): qty 1

## 2017-08-25 MED ORDER — FUROSEMIDE 10 MG/ML IJ SOLN
40.0000 mg | Freq: Two times a day (BID) | INTRAMUSCULAR | Status: DC
  Administered 2017-08-25 – 2017-08-27 (×6): 40 mg via INTRAVENOUS
  Filled 2017-08-25 (×6): qty 4

## 2017-08-25 NOTE — Plan of Care (Signed)
Patient alert and oriented, denis shortness of breath, indicates that pain control is difficult, rates 8/10 at present.  Medications given as charted.  States that he "doesn't take pain well".  Provided reassurance that this is temporary and that it will improve. Reinforced that ambulation and utilizing the ISP will improve this and prevent complications.  Will continue to monitor as per protocol.

## 2017-08-25 NOTE — Progress Notes (Signed)
Called by RN to redress A-line. No issues noted.

## 2017-08-25 NOTE — Progress Notes (Signed)
Patient ID: Paul Todd, male   DOB: [birth year], 57 y.o.   MRN: [id number] EVENING ROUNDS NOTE :     [address]       [place] [address]             [phone]                 1 Day Post-Op Procedure(s) (LRB): CORONARY ARTERY BYPASS GRAFTING (CABG) X3. RIGHT ENDOSCOPIC SAPHENOUS VEIN HARVEST. MAMM TO LAD, SVG TO OM, SVG TO RAMUS (N/A) TRANSESOPHAGEAL ECHOCARDIOGRAM (TEE) (N/A)  Total Length of Stay:  LOS: 3 days  BP 130/79   Pulse 81   Temp 98.1 F (36.7 C) (Oral)   Resp (!) 25   Ht 5\' 7"  (1.702 m)   Wt 129.1 kg   SpO2 97%   BMI 44.58 kg/m   .Intake/Output      08/19 0701 - 08/20 0700 08/20 0701 - 08/21 0700   P.O.     I.V. (mL/kg) 4238 (32.8) 491.2 (3.8)   Blood 1121    NG/GT 30    IV Piggyback 1982.4    Total Intake(mL/kg) 7371.3 (57.1) 491.2 (3.8)   Urine (mL/kg/hr) 2430 (0.8) 2030 (1.4)   Emesis/NG output 0    Blood 610    Chest Tube 378 240   Total Output 3418 2270   Net +3953.3 -1778.8        Urine Occurrence  2 x     . sodium chloride Stopped (08/24/17 2025)  . sodium chloride Stopped (08/25/17 0512)  . sodium chloride 10 mL/hr at 08/25/17 0800  . cefUROXime (ZINACEF)  IV 1.5 g (08/25/17 1644)  . lactated ringers    . lactated ringers 20 mL/hr at 08/25/17 0800  . milrinone 0.125 mcg/kg/min (08/25/17 0800)  . nitroGLYCERIN    . norepinephrine (LEVOPHED) Adult infusion Stopped (08/25/17 0730)  . phenylephrine (NEO-SYNEPHRINE) Adult infusion Stopped (08/24/17 2344)     Lab Results  Component Value Date   WBC 12.9 (H) 08/25/2017   HGB 10.3 (L) 08/25/2017   HCT 32.4 (L) 08/25/2017   PLT 120 (L) 08/25/2017   GLUCOSE 124 (H) 08/25/2017   CHOL 195 01/28/2013   TRIG 206 (H) 01/28/2013   HDL 43 01/28/2013   LDLCALC 111 (H) 01/28/2013   ALT 41 08/22/2017   AST 29 08/22/2017   NA 138 08/25/2017   K 4.2 08/25/2017   CL 111 08/25/2017   CREATININE 0.90 08/25/2017   BUN 9 08/25/2017   CO2 22 08/25/2017   TSH 0.819 08/22/2017     PSA 1.65 11/23/2012   INR 1.27 08/24/2017   HGBA1C 7.4 (H) 08/22/2017   Up to chair Not ambulated yet Off drips   [name] MD  Beeper [phone] Office [phone] 08/25/2017 5:52 PM

## 2017-08-25 NOTE — Progress Notes (Signed)
Urinary Catheter bag changed out to new urine meter bag d/t previous bag leaking at the bottom s/p 40 mg IV Lasix being given.  New bag replaced with sterile gloves and tegaderm wrapped around the connection/break site of foley and tubing.  Patient tolerated change of cathter bag well.

## 2017-08-25 NOTE — Progress Notes (Signed)
Placed patient on home CPAP unit with 2 pm O2 bleed in. Tolerating well at this time.

## 2017-08-25 NOTE — Procedures (Signed)
Extubation Procedure Note  Patient Details:   Name: Paul Todd DOB: Jul 25, 1960 MRN: [id number]   Airway Documentation:    Vent end date: 08/25/17 Vent end time: 0825   Evaluation  O2 sats: stable throughout Complications: No apparent complications Patient did tolerate procedure well. Bilateral Breath Sounds: Clear, Diminished   Yes   RT extubated patient to 4L [place] per MD order with RN at bedside. Pt had positive cuff leak. Vital capacity 850. NIF -40. Pt able to speak and no stridor noted. RN working with pt on incentive spirometer with good effort.   Vernona Rieger 08/25/2017, 8:35 AM

## 2017-08-25 NOTE — Progress Notes (Signed)
EKG CRITICAL VALUE     12 lead EKG performed.  Critical value noted.  Thea Silversmith, RN notified.   Ibrohim Simmers H, CCT 08/25/2017 6:50 AM

## 2017-08-25 NOTE — Op Note (Signed)
NAME: Paul Todd, Paul Todd MEDICAL RECORD [id number] ACCOUNT [id number] DATE OF BIRTH:27-Sep-1960 FACILITY: [facility] LOCATION: [facility] PHYSICIAN:Shavana Calder VAN TRIGT III, MD  OPERATIVE REPORT  DATE OF PROCEDURE:  08/24/2017  OPERATION: 1.  Coronary artery bypass grafting x3 (left internal mammary artery to left anterior descending, saphenous vein graft to ramus intermedius, saphenous vein graft to obtuse marginal). 2.  Endoscopic harvest of right leg greater saphenous vein.  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  SURGEON:  Ivin Poot, MD  ASSISTANT:  [name], PA-C  ANESTHESIA:  General by Dr. Belinda Block.  CLINICAL NOTE:  The patient is a 57 year old obese diabetic reformed smoker, who presented with symptoms of unstable angina.  Previously, he had undergone stent placement to the RCA and LAD.  He presented now with recurrent anginal symptoms and normal  enzymes.  He underwent repeat cardiac catheterization which found a new high-grade 80%-90% left main stenosis.  The stent to the RCA was widely patent.  LV function was preserved.  Because of his new left main stenosis, he was recommended for surgical  coronary revascularization by his cardiologist, Dr. Burt Knack.  I examined the patient after reviewing his coronary angiogram and echocardiogram and discussed the procedure of CABG for treatment of his severe CAD and left main stenosis.  I discussed the major aspects of the operation including the use of general  anesthesia and cardiopulmonary bypass, location of the surgical incisions, and the expected postoperative hospital recovery.  I discussed with the patient the benefits of improved survival, preservation of LV function, and improved symptoms.  I discussed  the risks including the risk of stroke, bleeding, blood transfusion requirement, postoperative  pulmonary problems including pleural effusion, postoperative organ failure, postoperative infection, and death.  After reviewing these issues, he demonstrated  his understanding and agreed to proceed with surgery under what I felt was informed consent.  OPERATIVE FINDINGS: 1.  Severe short statured morbid obesity made exposure of all the cardiac structures difficult.  The patient has had all of his saphenous vein initially used for surgical grafting, and would not be a candidate for redo CABG. 2.  Adequate conduit, target vessels were small, but graftable. 3.  No packed cell transfusions required for the surgery. 4.  Preserved LV systolic function after separation from cardiopulmonary bypass by transesophageal echo.  DESCRIPTION OF PROCEDURE:  The patient was brought to the operating room and placed supine on the operating table.  General anesthesia was induced under invasive hemodynamic monitoring.  A transesophageal echo probe was placed by the anesthesia team.   After a central line was placed in the subclavian vein because of poor venous access, the patient was prepped and draped as a sterile field.  A proper time-out was performed.  A sternal incision was made as the saphenous vein was harvested endoscopically  from the right leg.  The left internal mammary artery was harvested as a pedicle graft.  It was a 1.4 mm vessel with good flow.  The sternal retractor was placed using the deep blades.  The pericardium was opened and suspended.  Pursestrings were placed  in the ascending aorta and right atrium and heparin was administered.  The patient was cannulated and placed on cardiopulmonary bypass.  The coronaries were identified for grafting.  There were small, but graftable.  Cardioplegia cannulas were placed  both antegrade and retrograde cold blood cardioplegia.  The patient was  cooled to 32 degrees.  The mammary artery and vein grafts were prepared for the distal anastomosis.  The crossclamp was  applied, and 1 L of cold blood cardioplegia was delivered in  split doses between the antegrade aortic and retrograde coronary sinus catheters.  There was good cardioplegic arrest and supple temperature dropped less than 12 degrees.  Cardioplegia was delivered every 20 minutes.  The distal coronary anastomoses were performed.  The first distal anastomosis was the obtuse marginal branch of left circumflex.  There was a proximal 80% left main stenosis.  A reverse saphenous vein was sewn end-to-side with running 7-0 Prolene with  good flow through the graft.  Cardioplegia was redosed.  The second distal anastomosis was the ramus intermedius branch.  This was intramyocardial and was 1.5 mm vessel.  Reverse saphenous vein was sewn end-to-side with running 7-0 Prolene with good flow through the graft.  Cardioplegia was redosed.  The third distal anastomosis was the mid mid-LAD.  It was diffusely diseased and had a proximal 80% left main stenosis.  The left IMA pedicle was brought through an opening in the left lateral pericardium and was brought down onto the LAD and sewn  end-to-side with a running 8-0 Prolene.  There was good flow through the anastomosis after briefly releasing the bulldog on the pedicle.  The bulldog was reapplied and the pedicle was secured to the epicardium.  Cardioplegia was redosed.  While the cross clamp was still in place, 2 proximal vein anastomoses were performed on the ascending aorta using a 4.5 mm punch and running 6-0 Prolene.  Prior to tying down the final proximal anastomosis, air was vented from the coronaries with a dose  of retrograde warm blood cardioplegia.  The crossclamp was removed.  The vein grafts were deaired and opened.  Each had good flow.  Hemostasis was documented at the proximal and distal anastomoses.  Cardioplegic cannulas were removed.  The patient was rewarmed and reperfused.  Temporary pacing wires were applied.  The  lungs were reexpanded and the  ventilator was resumed.  The patient weaned off cardiopulmonary bypass without difficulty.  Echo showed preserved LV systolic function.  Hemodynamics were normal.  Protamine was administered without adverse reaction.  The cannulas were removed.  The mediastinum was irrigated.   The superior pericardial fat was closed over the aorta and vein grafts.  The anterior mediastinal and bilateral pleural tubes were placed and brought out through separate incisions.  The sternum was closed with interrupted steel wire.  The patient  remained stable.  The pectoralis fascia was closed with a running #1 Vicryl.  Subcutaneous and skin layers were closed with a running Vicryl.  Total cardiopulmonary bypass time was 100 minutes.  The patient returned to the ICU in stable condition.  TN/NUANCE  D:08/24/2017 T:08/25/2017 JOB:[id number]

## 2017-08-25 NOTE — Progress Notes (Signed)
1 Day Post-Op Procedure(s) (LRB): CORONARY ARTERY BYPASS GRAFTING (CABG) X3. RIGHT ENDOSCOPIC SAPHENOUS VEIN HARVEST. MAMM TO LAD, SVG TO OM, SVG TO RAMUS (N/A) TRANSESOPHAGEAL ECHOCARDIOGRAM (TEE) (N/A) Subjective: Stable postop cABG for L main nsr Ready for extubation- hx OSA severe obesity, difficult    intubation   Objective: Vital signs in last 24 hours: Temp:  [96.6 F (35.9 C)-99.1 F (37.3 C)] 98.1 F (36.7 C) (08/20 0800) Pulse Rate:  [87-91] 88 (08/20 0800) Cardiac Rhythm: Atrial paced (08/19 2000) Resp:  [8-35] 25 (08/20 0800) BP: (82-125)/(60-81) 107/63 (08/20 0800) SpO2:  [95 %-100 %] 95 % (08/20 0800) Arterial Line BP: (83-133)/(48-76) 125/48 (08/20 0800) FiO2 (%):  [40 %-50 %] 40 % (08/20 0800) Weight:  [129.1 kg] 129.1 kg (08/20 0500)  Hemodynamic parameters for last 24 hours: PAP: (21-41)/(9-24) 35/20 CO:  [4.3 L/min-5.9 L/min] 5.6 L/min CI:  [1.9 L/min/m2-5.1 L/min/m2] 2.5 L/min/m2  Intake/Output from previous day: 08/19 0701 - 08/20 0700 In: 7371.3 [I.V.:4238; Blood:1121; NG/GT:30; IV Piggyback:1982.4] Out: 3418 [Urine:2430; Blood:610; Chest Tube:378] Intake/Output this shift: Total I/O In: 197.3 [I.V.:197.3] Out: -   Alert on vent extrem warm Lungs clear  Lab Results: Recent Labs    08/24/17 2002 08/24/17 2013 08/25/17 0355  WBC 14.2*  --  12.9*  HGB 10.9* 10.2* 10.3*  HCT 34.0* 30.0* 32.4*  PLT 132*  --  120*   BMET:  Recent Labs    08/24/17 0532  08/24/17 2013 08/25/17 0355  NA 142   < > 140 138  K 4.1   < > 4.7 4.2  CL 109   < > 106 111  CO2 24  --   --  22  GLUCOSE 129*   < > 141* 124*  BUN 10   < > 9 9  CREATININE 0.93   < > 0.70 0.90  CALCIUM 9.3  --   --  7.5*   < > = values in this interval not displayed.    PT/INR:  Recent Labs    08/24/17 1446  LABPROT 15.7*  INR 1.27   ABG    Component Value Date/Time   PHART 7.372 08/25/2017 0658   HCO3 25.2 08/25/2017 0658   TCO2 27 08/25/2017 0658   ACIDBASEDEF 1.9  08/25/2017 0400   O2SAT 99.0 08/25/2017 0658   CBG (last 3)  Recent Labs    08/25/17 0519 08/25/17 0617 08/25/17 0657  GLUCAP 109* 138* 136*    Assessment/Plan: S/P Procedure(s) (LRB): CORONARY ARTERY BYPASS GRAFTING (CABG) X3. RIGHT ENDOSCOPIC SAPHENOUS VEIN HARVEST. MAMM TO LAD, SVG TO OM, SVG TO RAMUS (N/A) TRANSESOPHAGEAL ECHOCARDIOGRAM (TEE) (N/A) See progression orders   LOS: 3 days    [name] 08/25/2017

## 2017-08-25 NOTE — Progress Notes (Signed)
Progress Note  Patient Name: Paul Todd   Date of Encounter: 08/25/2017  Primary Cardiologist: Hilty  Subjective   Sitting in chair, still having some sternal incision pain but feels it is manageable. Overall feels that he is doing well.  Inpatient Medications    Scheduled Meds: . acetaminophen  1,000 mg Oral Q6H   Or  . acetaminophen (TYLENOL) oral liquid 160 mg/5 mL  1,000 mg Per Tube Q6H  . amiodarone  400 mg Oral BID  . aspirin EC  325 mg Oral Daily   Or  . aspirin  324 mg Per Tube Daily  . atorvastatin  80 mg Oral q1800  . bisacodyl  10 mg Oral Daily   Or  . bisacodyl  10 mg Rectal Daily  . chlorhexidine  15 mL Mouth Rinse BID  . Chlorhexidine Gluconate Cloth  6 each Topical Daily  . docusate sodium  200 mg Oral Daily  . DULoxetine  60 mg Oral QPM  . furosemide  40 mg Intravenous BID  . insulin aspart  0-24 Units Subcutaneous Q4H  . insulin aspart  4 Units Subcutaneous TID WC  . insulin detemir  15 Units Subcutaneous BID  . ketorolac  15 mg Intravenous Q6H  . mouth rinse  15 mL Mouth Rinse q12n4p  . metoCLOPramide (REGLAN) injection  10 mg Intravenous Q6H  . metoprolol tartrate  12.5 mg Oral BID   Or  . metoprolol tartrate  12.5 mg Per Tube BID  . mometasone-formoterol  2 puff Inhalation BID  . [START ON 08/26/2017] pantoprazole  40 mg Oral Daily  . sodium chloride flush  10-40 mL Intracatheter Q12H  . sodium chloride flush  3 mL Intravenous Q12H   Continuous Infusions: . sodium chloride Stopped (08/24/17 2025)  . sodium chloride Stopped (08/25/17 0512)  . sodium chloride 10 mL/hr at 08/25/17 0800  . cefUROXime (ZINACEF)  IV 1.5 g (08/25/17 1644)  . lactated ringers    . lactated ringers 20 mL/hr at 08/25/17 0800  . milrinone 0.125 mcg/kg/min (08/25/17 0800)  . nitroGLYCERIN    . norepinephrine (LEVOPHED) Adult infusion Stopped (08/25/17 0730)  . phenylephrine (NEO-SYNEPHRINE) Adult infusion Stopped (08/24/17 2344)   PRN Meds: sodium chloride,  metoprolol tartrate, midazolam, morphine injection, ondansetron (ZOFRAN) IV, oxyCODONE, sodium chloride flush, sodium chloride flush, traMADol   Vital Signs    Vitals:   08/25/17 1300 08/25/17 1500 08/25/17 1555 08/25/17 1600  BP: 136/77 127/81  130/79  Pulse: 89 89  81  Resp: (!) 27 11  (!) 25  Temp: 98.2 F (36.8 C)  98.1 F (36.7 C)   TempSrc:   Oral   SpO2: 96% 97%  97%  Weight:      Height:        Intake/Output Summary (Last 24 hours) at 08/25/2017 1728 Last data filed at 08/25/2017 1400 Gross per 24 hour  Intake 2475.42 ml  Output 3433 ml  Net -957.58 ml    I/O since admission:  +120  Filed Weights   08/23/17 0532 08/24/17 0448 08/25/17 0500  Weight: 118.2 kg 118.6 kg 129.1 kg    Telemetry    NSR/paced - Personally Reviewed  ECG    ECG NSR with wandering baseline, minimal st elevation predominantly in inferior leads  Physical Exam   BP 130/79   Pulse 81   Temp 98.1 F (36.7 C) (Oral)   Resp (!) 25   Ht 5\' 7"  (1.702 m)   Wt 129.1 kg   SpO2 97%  BMI 44.58 kg/m  General: Alert, oriented, no distress.  Skin: normal turgor, no rashes, warm and dry HEENT: Normocephalic, atraumatic. sclera anicteric; extraocular muscles intact Neck: No JVD, no carotid bruits; normal carotid upstroke Lungs: coarse at bases Chest wall: dressed sternal wound Heart: regular S1 and S2, distant heart sounds Abdomen: soft, nontender; BS+ Pulses 2+ bilateral radial and DP Musculoskeletal: moving limbs independently Extremities: trace bilateral LE edema Neurologic: grossly nonfocal; Cranial nerves grossly wnl Psychologic: Normal mood and affect   Labs    Chemistry Recent Labs  Lab 08/22/17 0405 08/24/17 0532  08/24/17 1311 08/24/17 1445 08/24/17 2002 08/24/17 2013 08/25/17 0355  NA 141 142   < > 140 141  --  140 138  K 4.0 4.1   < > 4.2 3.9  --  4.7 4.2  CL 112* 109   < > 103  --   --  106 111  CO2 23 24  --   --   --   --   --  22  GLUCOSE 123* 129*   < > 169*  148*  --  141* 124*  BUN 12 10   < > 8  --   --  9 9  CREATININE 0.86 0.93   < > 0.60*  --  0.81 0.70 0.90  CALCIUM 8.4* 9.3  --   --   --   --   --  7.5*  PROT 6.8  --   --   --   --   --   --   --   ALBUMIN 3.6  --   --   --   --   --   --   --   AST 29  --   --   --   --   --   --   --   ALT 41  --   --   --   --   --   --   --   ALKPHOS 79  --   --   --   --   --   --   --   BILITOT 0.8  --   --   --   --   --   --   --   GFRNONAA >60 >60  --   --   --  >60  --  >60  GFRAA >60 >60  --   --   --  >60  --  >60  ANIONGAP 6 9  --   --   --   --   --  5   < > = values in this interval not displayed.     Hematology Recent Labs  Lab 08/24/17 1446 08/24/17 2002 08/24/17 2013 08/25/17 0355  WBC 16.5* 14.2*  --  12.9*  RBC 3.63* 3.69*  --  3.48*  HGB 10.6* 10.9* 10.2* 10.3*  HCT 33.4* 34.0* 30.0* 32.4*  MCV 92.0 92.1  --  93.1  MCH 29.2 29.5  --  29.6  MCHC 31.7 32.1  --  31.8  RDW 14.2 14.4  --  14.6  PLT 112* 132*  --  120*    Cardiac Enzymes Recent Labs  Lab 08/21/17 0342  TROPONINI <0.03    Recent Labs  Lab 08/20/17 1836 08/21/17 0009  TROPIPOC 0.03 0.00     BNPNo results for input(s): BNP, PROBNP in the last 168 hours.   DDimer No results for input(s): DDIMER in the last 168 hours.   Lipid  Panel     Component Value Date/Time   CHOL 195 01/28/2013 0939   TRIG 206 (H) 01/28/2013 0939   HDL 43 01/28/2013 0939   CHOLHDL 5.5 11/23/2012 0949   VLDL 46 (H) 11/23/2012 0949   LDLCALC 111 (H) 01/28/2013 0939    Radiology    Dg Chest Port 1 View  Result Date: 08/25/2017 CLINICAL DATA:  Intubation. EXAM: PORTABLE CHEST 1 VIEW COMPARISON:  08/24/2017. FINDINGS: Endotracheal tube, Swan-Ganz catheter, bilateral chest tubes in stable position. Right subclavian line stable position with its tip again projected over the innominate vein. Prior CABG. Stable cardiomegaly. Low lung volumes with bibasilar atelectasis again noted. Left lung base infiltrate noted on today's  exam. IMPRESSION: 1.  Lines and tubes in stable position. 2.  Prior CABG.  Cardiomegaly. 3. Low lung volumes with bibasilar atelectasis. Left base infiltrate noted on today's exam. Electronically Signed   By: Marcello Moores  Register   On: 08/25/2017 06:55   Dg Chest Port 1 View  Result Date: 08/24/2017 CLINICAL DATA:  Followup CABG EXAM: PORTABLE CHEST 1 VIEW COMPARISON:  Earlier same day. FINDINGS: Median sternotomy and CABG. Endotracheal tube tip is 5 cm above the carina. Nasogastric or orogastric tube enters the stomach. Swan-Ganz catheter inserted from a left jugular approach has its tip in the right main pulmonary artery. Bilateral chest tubes in place. No pneumothorax. Right subclavian central line tip appears to cross into the innominate vein. The lungs are clear. No pulmonary edema. IMPRESSION: Status post CABG. No pneumothorax. Lungs well aerated. No edema. Lines and tubes well positioned, with the exception that the right subclavian central line appears to cross over into the innominate vein Electronically Signed   By: Nelson Chimes M.D.   On: 08/24/2017 15:14   Dg Chest Portable 1 View  Result Date: 08/24/2017 CLINICAL DATA:  Line placement EXAM: PORTABLE CHEST 1 VIEW COMPARISON:  08/22/2017 FINDINGS: Interval placement of Swan-Ganz catheter with the tip in the central right pulmonary artery. No pneumothorax. Cardiomegaly. Vascular congestion and increasing interstitial prominence, likely interstitial edema. Low lung volumes. No effusions. IMPRESSION: Placement of Swan-Ganz catheter with the tip in the central right pulmonary artery. No pneumothorax. Low lung volumes with vascular congestion and possible early interstitial edema. Electronically Signed   By: Rolm Baptise M.D.   On: 08/24/2017 08:39    Cardiac Studies   Cath/PCI to RCA 02/20/2013; staged PCI to LAD 02/14/2013  Echo 08/21/17 - Left ventricle: The cavity size was normal. Wall thickness was   increased in a pattern of mild LVH. Systolic  function was normal.   The estimated ejection fraction was in the range of 55% to 60%.   Wall motion was normal; there were no regional wall motion   abnormalities. Doppler parameters are consistent with abnormal   left ventricular relaxation (grade 1 diastolic dysfunction).  Cath 08/21/17  The left ventricular ejection fraction is 55-65% by visual estimate.  LV end diastolic pressure is normal.  The left ventricular systolic function is normal.  Previously placed Prox RCA to Mid RCA stent (unknown type) is widely patent.  Dist LM lesion is 75% stenosed.  Ost LAD lesion is 75% stenosed.  Previously placed Prox LAD stent (unknown type) is widely patent.   1. Severe distal left main and ostial LAD stenosis confirmed by positive FFR analysis (0.76) 2. Continued patency of the stented segments in the LAD and RCA 3. Patent left circumflex 4. Normal LV systolic function  Recommend: TCTS consult for CABG. IV heparin, inpatient  status with severe left main stenosis.  Op note 08/24/17 CORONARY ARTERY BYPASS GRAFTING (CABG) X3 -LIMA to LAD -SVG to OM -SVG to RAMUS INTERMEDIATE  Patient Profile     57 year old man with known coronary disease and peripheral vascular disease, who presents with an episode of chest discomfort concerning for unstable angina. Found to have severe disease, recommended for CABG. Now s/p CABG 08/24/17  Assessment & Plan    1. CAD, s/p CABG 08/24/17 -Doing well, sitting in chair today.  -On aspirin 324 mg, metoprolol, amiodarone, furosemide, atorvastatin -sounds atelectatic, encourage incentive spirometry  2. PVD: s/p L fem-pop  3. Anticoagulation: holding rivaroxaban, was on for DVT  4. OSA  Buford Dresser, MD, [facility]  [address], [place], [place] [address] [phone]

## 2017-08-26 ENCOUNTER — Inpatient Hospital Stay (HOSPITAL_COMMUNITY): Payer: BLUE CROSS/BLUE SHIELD

## 2017-08-26 DIAGNOSIS — I480 Paroxysmal atrial fibrillation: Secondary | ICD-10-CM

## 2017-08-26 LAB — GLUCOSE, CAPILLARY
Glucose-Capillary: 112 mg/dL — ABNORMAL HIGH (ref 70–99)
Glucose-Capillary: 125 mg/dL — ABNORMAL HIGH (ref 70–99)
Glucose-Capillary: 128 mg/dL — ABNORMAL HIGH (ref 70–99)
Glucose-Capillary: 131 mg/dL — ABNORMAL HIGH (ref 70–99)
Glucose-Capillary: 136 mg/dL — ABNORMAL HIGH (ref 70–99)
Glucose-Capillary: 147 mg/dL — ABNORMAL HIGH (ref 70–99)
Glucose-Capillary: 149 mg/dL — ABNORMAL HIGH (ref 70–99)

## 2017-08-26 LAB — BASIC METABOLIC PANEL
Anion gap: 8 (ref 5–15)
BUN: 10 mg/dL (ref 6–20)
CO2: 25 mmol/L (ref 22–32)
Calcium: 8.1 mg/dL — ABNORMAL LOW (ref 8.9–10.3)
Chloride: 103 mmol/L (ref 98–111)
Creatinine, Ser: 0.87 mg/dL (ref 0.61–1.24)
GFR calc Af Amer: >60 mL/min (ref >60)
GFR calc non Af Amer: >60 mL/min (ref >60)
Glucose, Bld: 137 mg/dL — ABNORMAL HIGH (ref 70–99)
Potassium: 3.7 mmol/L (ref 3.5–5.1)
Sodium: 136 mmol/L (ref 135–145)

## 2017-08-26 LAB — POCT I-STAT 3, ART BLOOD GAS (G3+)
Acid-Base Excess: 1 mmol/L (ref 0.0–2.0)
Bicarbonate: 25.8 mmol/L (ref 20.0–28.0)
O2 Saturation: 99 %
Patient temperature: 98.4
TCO2: 27 mmol/L (ref 22–32)
pCO2 arterial: 39.7 mmHg (ref 32.0–48.0)
pH, Arterial: 7.421 (ref 7.350–7.450)
pO2, Arterial: 157 mmHg — ABNORMAL HIGH (ref 83.0–108.0)

## 2017-08-26 LAB — CBC
HCT: 35.7 % — ABNORMAL LOW (ref 39.0–52.0)
Hemoglobin: 11.2 g/dL — ABNORMAL LOW (ref 13.0–17.0)
MCH: 29.3 pg (ref 26.0–34.0)
MCHC: 31.4 g/dL (ref 30.0–36.0)
MCV: 93.5 fL (ref 78.0–100.0)
Platelets: 129 10*3/uL — ABNORMAL LOW (ref 150–400)
RBC: 3.82 MIL/uL — ABNORMAL LOW (ref 4.22–5.81)
RDW: 15.4 % (ref 11.5–15.5)
WBC: 16.8 10*3/uL — ABNORMAL HIGH (ref 4.0–10.5)

## 2017-08-26 LAB — COOXEMETRY PANEL
Carboxyhemoglobin: 1.2 % (ref 0.5–1.5)
Methemoglobin: 2.2 % — ABNORMAL HIGH (ref 0.0–1.5)
O2 Saturation: 69.2 %
Total hemoglobin: 11.4 g/dL — ABNORMAL LOW (ref 12.0–16.0)

## 2017-08-26 MED ORDER — INSULIN ASPART 100 UNIT/ML ~~LOC~~ SOLN: 0.0000 [IU] | Freq: Every day | SUBCUTANEOUS | Status: DC

## 2017-08-26 MED ORDER — POTASSIUM CHLORIDE 10 MEQ/50ML IV SOLN
10.0000 meq | INTRAVENOUS | Status: AC
  Administered 2017-08-26 (×3): 10 meq via INTRAVENOUS
  Filled 2017-08-26 (×3): qty 50

## 2017-08-26 MED ORDER — POTASSIUM CHLORIDE CRYS ER 20 MEQ PO TBCR
20.0000 meq | EXTENDED_RELEASE_TABLET | Freq: Two times a day (BID) | ORAL | Status: DC
  Administered 2017-08-26 – 2017-09-02 (×15): 20 meq via ORAL
  Filled 2017-08-26 (×15): qty 1

## 2017-08-26 MED ORDER — INSULIN ASPART 100 UNIT/ML ~~LOC~~ SOLN
0.0000 [IU] | Freq: Three times a day (TID) | SUBCUTANEOUS | Status: DC
  Administered 2017-08-26 – 2017-09-02 (×11): 2 [IU] via SUBCUTANEOUS

## 2017-08-26 MED ORDER — LOSARTAN POTASSIUM 25 MG PO TABS
25.0000 mg | ORAL_TABLET | Freq: Every day | ORAL | Status: DC
  Administered 2017-08-26 – 2017-09-01 (×7): 25 mg via ORAL
  Filled 2017-08-26 (×7): qty 1

## 2017-08-26 MED ORDER — INSULIN ASPART 100 UNIT/ML ~~LOC~~ SOLN: 0.0000 [IU] | SUBCUTANEOUS | Status: DC

## 2017-08-26 MED FILL — Sodium Chloride IV Soln 0.9%: INTRAVENOUS | Qty: 2000 | Status: AC

## 2017-08-26 MED FILL — Sodium Bicarbonate IV Soln 8.4%: INTRAVENOUS | Qty: 50 | Status: AC

## 2017-08-26 MED FILL — Heparin Sodium (Porcine) Inj 1000 Unit/ML: INTRAMUSCULAR | Qty: 30 | Status: AC

## 2017-08-26 MED FILL — Electrolyte-R (PH 7.4) Solution: INTRAVENOUS | Qty: 5000 | Status: AC

## 2017-08-26 MED FILL — Lidocaine HCl(Cardiac) IV PF Soln Pref Syr 100 MG/5ML (2%): INTRAVENOUS | Qty: 5 | Status: AC

## 2017-08-26 MED FILL — Mannitol IV Soln 20%: INTRAVENOUS | Qty: 500 | Status: AC

## 2017-08-26 NOTE — Progress Notes (Signed)
Patient ID: Paul Todd, male   DOB: [birth year], 57 y.o.   MRN: [id number] TCTS Evening Rounds:  Hemodynamically stable in sinus rhythm sats 96% on RA Urine output ok Awaiting bed on 4E.

## 2017-08-26 NOTE — Progress Notes (Signed)
TCTS DAILY ICU PROGRESS NOTE                   [address]            [place] [address]          [phone]   2 Days Post-Op Procedure(s) (LRB): CORONARY ARTERY BYPASS GRAFTING (CABG) X3. RIGHT ENDOSCOPIC SAPHENOUS VEIN HARVEST. MAMM TO LAD, SVG TO OM, SVG TO RAMUS (N/A) TRANSESOPHAGEAL ECHOCARDIOGRAM (TEE) (N/A)  Total Length of Stay:  LOS: 4 days   Subjective:  Feels a little better today.  + ambulation with assistance.  No BM, + flatus  Objective: Vital signs in last 24 hours: Temp:  [98.1 F (36.7 C)-98.8 F (37.1 C)] 98.2 F (36.8 C) (08/21 0700) Pulse Rate:  [72-91] 78 (08/21 0800) Cardiac Rhythm: Normal sinus rhythm (08/21 0800) Resp:  [11-39] 39 (08/21 0800) BP: (115-157)/(65-90) 157/76 (08/21 0800) SpO2:  [94 %-100 %] 96 % (08/21 0803) Arterial Line BP: (140-186)/(68-84) 186/74 (08/20 1200) Weight:  [121 kg] 121 kg (08/21 0500)  Filed Weights   08/24/17 0448 08/25/17 0500 08/26/17 0500  Weight: 118.6 kg 129.1 kg 121 kg    Weight change: -8.1 kg   Hemodynamic parameters for last 24 hours: PAP: (31-44)/(17-23) 31/17  Intake/Output from previous day: 08/20 0701 - 08/21 0700 In: 728.9 [I.V.:518.1; IV Piggyback:210.8] Out: 1497 [WYOVZ:8588; Chest Tube:480]  Intake/Output this shift: Total I/O In: 39.1 [IV Piggyback:39.1] Out: 125 [Urine:125]  Current Meds: Scheduled Meds: . acetaminophen  1,000 mg Oral Q6H   Or  . acetaminophen (TYLENOL) oral liquid 160 mg/5 mL  1,000 mg Per Tube Q6H  . amiodarone  400 mg Oral BID  . aspirin EC  325 mg Oral Daily   Or  . aspirin  324 mg Per Tube Daily  . atorvastatin  80 mg Oral q1800  . bisacodyl  10 mg Oral Daily   Or  . bisacodyl  10 mg Rectal Daily  . chlorhexidine  15 mL Mouth Rinse BID  . Chlorhexidine Gluconate Cloth  6 each Topical Daily  . docusate sodium  200 mg Oral Daily  . DULoxetine  60 mg Oral QPM  . furosemide  40 mg Intravenous BID  . insulin aspart  0-24 Units Subcutaneous  Q4H  . insulin aspart  4 Units Subcutaneous TID WC  . insulin detemir  15 Units Subcutaneous BID  . ketorolac  15 mg Intravenous Q6H  . mouth rinse  15 mL Mouth Rinse q12n4p  . metoCLOPramide (REGLAN) injection  10 mg Intravenous Q6H  . metoprolol tartrate  12.5 mg Oral BID   Or  . metoprolol tartrate  12.5 mg Per Tube BID  . mometasone-formoterol  2 puff Inhalation BID  . pantoprazole  40 mg Oral Daily  . sodium chloride flush  10-40 mL Intracatheter Q12H  . sodium chloride flush  3 mL Intravenous Q12H   Continuous Infusions: . sodium chloride Stopped (08/24/17 2025)  . sodium chloride Stopped (08/25/17 0512)  . sodium chloride 10 mL/hr at 08/25/17 0800  . lactated ringers    . lactated ringers 20 mL/hr at 08/25/17 0800  . nitroGLYCERIN    . norepinephrine (LEVOPHED) Adult infusion Stopped (08/25/17 0730)  . phenylephrine (NEO-SYNEPHRINE) Adult infusion Stopped (08/24/17 2344)  . potassium chloride 10 mEq (08/26/17 0815)   PRN Meds:.sodium chloride, metoprolol tartrate, midazolam, morphine injection, ondansetron (ZOFRAN) IV, oxyCODONE, sodium chloride flush, sodium chloride flush, traMADol  General appearance: alert, cooperative and no distress Heart:  regular rate and rhythm Lungs: clear to auscultation bilaterally Abdomen: soft, non-tender; bowel sounds normal; no masses,  no organomegaly Extremities: extremities normal, atraumatic, no cyanosis or edema Wound: aquacel on sternum, EVH site is clean and dry  Lab Results: CBC: Recent Labs    08/25/17 1856 08/25/17 1900 08/26/17 0345  WBC 18.1*  --  16.8*  HGB 11.9* 12.2* 11.2*  HCT 37.9* 36.0* 35.7*  PLT 135*  --  129*   BMET:  Recent Labs    08/25/17 0355  08/25/17 1900 08/26/17 0345  NA 138  --  139 136  K 4.2  --  3.8 3.7  CL 111  --  101 103  CO2 22  --   --  25  GLUCOSE 124*  --  158* 137*  BUN 9  --  8 10  CREATININE 0.90   < > 0.80 0.87  CALCIUM 7.5*  --   --  8.1*   < > = values in this interval not  displayed.    CMET: Lab Results  Component Value Date   WBC 16.8 (H) 08/26/2017   HGB 11.2 (L) 08/26/2017   HCT 35.7 (L) 08/26/2017   PLT 129 (L) 08/26/2017   GLUCOSE 137 (H) 08/26/2017   CHOL 195 01/28/2013   TRIG 206 (H) 01/28/2013   HDL 43 01/28/2013   LDLCALC 111 (H) 01/28/2013   ALT 41 08/22/2017   AST 29 08/22/2017   NA 136 08/26/2017   K 3.7 08/26/2017   CL 103 08/26/2017   CREATININE 0.87 08/26/2017   BUN 10 08/26/2017   CO2 25 08/26/2017   TSH 0.819 08/22/2017   PSA 1.65 11/23/2012   INR 1.27 08/24/2017   HGBA1C 7.4 (H) 08/22/2017      PT/INR:  Recent Labs    08/24/17 1446  LABPROT 15.7*  INR 1.27   Radiology: Dg Chest Port 1 View  Result Date: 08/26/2017 CLINICAL DATA:  Chest tube, CABG EXAM: PORTABLE CHEST 1 VIEW COMPARISON:  08/25/2017 FINDINGS: Bilateral chest tubes remain in place, unchanged. No pneumothorax. Interval removal of endotracheal tube, NG tube and Swan-Ganz catheter. Cardiomegaly with vascular congestion. Low volumes with bibasilar atelectasis. IMPRESSION: Bilateral chest tubes remain in place without pneumothorax. Low volumes with vascular congestion and bibasilar atelectasis. Electronically Signed   By: Rolm Baptise M.D.   On: 08/26/2017 08:24     Assessment/Plan: S/P Procedure(s) (LRB): CORONARY ARTERY BYPASS GRAFTING (CABG) X3. RIGHT ENDOSCOPIC SAPHENOUS VEIN HARVEST. MAMM TO LAD, SVG TO OM, SVG TO RAMUS (N/A) TRANSESOPHAGEAL ECHOCARDIOGRAM (TEE) (N/A)  1. CV- NSR, off all drips, + HTN- continue Lopressor, Amiodarone, will start low dose Cozaar prior to discharge, will plan to resume Xarelto at discharge for chronic A. FIb 2. Pulm- no acute issues, continue IS, CXR no pneumothorax, atelectasis bilaterally, will d/c chest tubes today 3. Renal- creatinine WNL remains volume overloaded, will continue Lasix, increase potassium supplementation 4. Expected post operative blood loss anemia, mild at 11.2 5. DM- sugars controlled, continue  insulin  Regimen 6. Dispo- patient stable, will start Cozaar for better BP control, d/c chest tubes, continue diuretics, supplement K, will transfer to step down 4E     Leny Morozov 08/26/2017 8:40 AM

## 2017-08-26 NOTE — Discharge Summary (Addendum)
Physician Discharge Summary  Patient ID: Paul Todd MRN: [id number] DOB/AGE: 05/04/1960 57 y.o.  Admit date: 08/20/2017 Discharge date: 09/02/2017  Admission Diagnoses:  Patient Active Problem List   Diagnosis Date Noted  . Coronary artery disease involving native coronary artery of native heart with unstable angina pectoris ([place])   . PAD (peripheral artery disease) ([place]) 04/07/2017  . Ischemic cardiomyopathy 11/25/2016  . Mixed hyperlipidemia 11/25/2016  . Pre-operative cardiovascular examination 08/23/2015  . PVC (premature ventricular contraction) 07/24/2014  . Encounter for cardioversion procedure   . Persistent atrial fibrillation ([place])   . Pulmonary embolus ([place]) 06/20/2014  . Back pain 02/21/2014  . Chest pain with high risk for cardiac etiology 09/05/2013  . Nephrolithiasis 09/05/2013  . Hematuria, gross 09/05/2013  . Nausea and vomiting 09/05/2013  . Nausea & vomiting   . Old myocardial infarction   . Emesis 09/02/2013  . OSA (obstructive sleep apnea) 07/22/2013  . Obesity (BMI 35.0-39.9 without comorbidity) 03/07/2013  . Unstable angina - Post Infarct Angina with existing LAD disease 02/14/2013  . CAD S/P percutaneous coronary angioplasty - DES to Mid LAD; 2x 29m x 38 mm Xience DES to RCA 02/14/2013  . Dyslipidemia 02/12/2013  . NSTEMI (non-ST elevated myocardial infarction) ([place] 01/30/2013  . Tobacco abuse 01/30/2013  . Fatigue 01/30/2013  . PAF (paroxysmal atrial fibrillation) ([place] 01/30/2013   Discharge Diagnoses:   Patient Active Problem List   Diagnosis Date Noted  . Type 2 diabetes mellitus without complication, without long-term current use of insulin ([place] 08/27/2017  . History of DVT (deep vein thrombosis) 08/27/2017  . History of pulmonary embolus (PE) 08/27/2017  . 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 Coronary artery disease involving native coronary artery of native heart with unstable angina pectoris ([place]   . PAD (peripheral artery disease) ([facility]  04/07/2017  . Ischemic cardiomyopathy 11/25/2016  . Mixed hyperlipidemia 11/25/2016  . Pre-operative cardiovascular examination 08/23/2015  . PVC (premature ventricular contraction) 07/24/2014  . Encounter for cardioversion procedure   . Persistent atrial fibrillation ([place]   . Pulmonary embolus ([place] 06/20/2014  . Back pain 02/21/2014  . Chest pain with high risk for cardiac etiology 09/05/2013  . Nephrolithiasis 09/05/2013  . Hematuria, gross 09/05/2013  . Nausea and vomiting 09/05/2013  . Nausea & vomiting   . Old myocardial infarction   . Emesis 09/02/2013  . OSA (obstructive sleep apnea) 07/22/2013  . Obesity (BMI 35.0-39.9 without comorbidity) 03/07/2013  . Unstable angina - Post Infarct Angina with existing LAD disease 02/14/2013  . CAD S/P percutaneous coronary angioplasty - DES to Mid LAD; 2x 331mx 38 mm Xience DES to RCA 02/14/2013  . Dyslipidemia 02/12/2013  . NSTEMI (non-ST elevated myocardial infarction) ([facility]01/25/2015  . Tobacco abuse 01/30/2013  . Fatigue 01/30/2013  . PAF (paroxysmal atrial fibrillation) ([facility]01/25/2015   Discharged Condition: good  History of Present Illness:  Paul Todd a [age]o obese white male with known history of CAD S/P NSTEMI, with PCI with stent to LAD and RCA in 2015.  He also has a long standing history of Paroxysmal Atrial Fibrillation, PE on Xarelto, DM, HTN, and PAD S/P Fem Pop bypass on left.  He presented to the ED with complaints of chest pain with radiation to his jaw.  This developed while the patient was power washing his house and felt these symptoms were similar to his previous MI.  He rested which eased the pain, but it ultimately recurred prompting patient to present to the ED.  Workup in the ED showed no evidence of ischemia on EKG and his troponin levels were negative.  He was admitted for observation and further workup.    Hosital Course:   He underwent cardiac catheterization on 08/21/2017.  He was found to have CAD  of his LAD with LM involvement.  It was felt coronary bypass grafting would be indicated and TCTS consult was requested.  He was evaluated by Paul Todd who was in agreement the patient would require bypass surgery.  The risks and benefits of the procedure were explained to the patient and he was agreeable to proceed.  He was taken to the operating room on 08/24/2017.  He underwent CABG x 3 utilizing LIMA to LAD, SVG to Ramus Intermediate, and SVG to OM.  He also underwent endoscopic harvest of greater saphenous vein from his right leg.  He tolerated the procedure without difficulty and was taken to the SICU in stable condition.  During his stay in the SICU the patient was weaned off Milrinone and Levophed as tolerated.  He was weaned and extubated on POD #1.  He was started on aggressive diuresis for hypervolemia.  His chest tubes were removed without difficulty on 08/26/2017.  He was hypertensive and started on low dose Cozaar.  He was in NSR and ambulating with assistance.  He was medically stable for transfer to the step down unit on 08/27/2017.  He unfortunately converted into rapid Atrial Flutter/Fibrillation.  He has a chronic history of Atrial Fibrillation.  He was taken off Amiodarone and after 48 hours he was reloaded on his home regimen of Tikosyn per Cardiology.  His pacing wires were removed without difficulty and he was restarted on home regimen of Xarelto.  Despite medications patient continued to remain in A. Flutter with high rat//es at time.  Cardiology felt cardioversion would be indicated. He underwent DCCV on 08/31/2017.  He held NSR for about an hour and unfortunately converted back into Atrial Flutter.  He has been taken off Tikosyn and will be started on Amiodarone on 09/03/2017.  His Lopressor dose has been increased for better HR control and his cozaar has been discontinued.  His IV Cardizem has been discontinued and he has been started on an oral regimen of this.  He is ambulating  independently.  His incisions are healing without difficulty.  He is medically stable for discharge            Significant Diagnostic Studies: angiography:    The left ventricular ejection fraction is 55-65% by visual estimate.  LV end diastolic pressure is normal.  The left ventricular systolic function is normal.  Previously placed Prox RCA to Mid RCA stent (unknown type) is widely patent.  Dist LM lesion is 75% stenosed.  Ost LAD lesion is 75% stenosed.  Previously placed Prox LAD stent (unknown type) is widely patent.   1. Severe distal left main and ostial LAD stenosis confirmed by positive FFR analysis (0.76) 2. Continued patency of the stented segments in the LAD and RCA 3. Patent left circumflex 4. Normal LV systolic function  Treatments: surgery:   1.  Coronary artery bypass grafting x3 (left internal mammary artery to left anterior descending, saphenous vein graft to ramus intermedius, saphenous vein graft to obtuse marginal). 2.  Endoscopic harvest of right leg greater saphenous vein.  Discharge Exam: Blood pressure 124/65, pulse 86, temperature 98.1 F (36.7 C), temperature source Oral, resp. rate (!) 22, height '5\' 7"'$  (1.702 m), weight 117 kg, SpO2 99 %.  General appearance: alert, cooperative and no distress Heart: regular rate and rhythm Lungs: clear to auscultation bilaterally Abdomen: soft, non-tender; bowel sounds normal; no masses,  no organomegaly Extremities: edema trace Wound: clean and dry   Disposition: Home  Discharge Medications:  The patient has been discharged on:   1.Beta Blocker:  Yes [ x  ]                              No   [   ]                              If No, reason:  2.Ace Inhibitor/ARB: Yes [   ]                                     No  [ x   ]                                     If No, reason: titration of BB  3.Statin:   Yes [x   ]                  No  [   ]                  If No, reason:  4.Ecasa:  Yes  [  x ]                   No   [   ]                  If No, reason:     Discharge Instructions    Amb Referral to Cardiac Rehabilitation   Complete by:  As directed    Diagnosis:  CABG   CABG X ___:  3   Ambulatory referral to Nutrition and Diabetic Education   Complete by:  As directed      Allergies as of 09/02/2017   No Known Allergies     Medication List    STOP taking these medications   dofetilide 250 MCG capsule Commonly known as:  TIKOSYN   metoprolol succinate 50 MG 24 hr tablet Commonly known as:  TOPROL-XL   sildenafil 50 MG tablet Commonly known as:  VIAGRA     TAKE these medications   acetaminophen 650 MG CR tablet Commonly known as:  TYLENOL Take 650 mg by mouth every 8 (eight) hours as needed for pain.   albuterol 108 (90 Base) MCG/ACT inhaler Commonly known as:  PROVENTIL HFA;VENTOLIN HFA Inhale 1-2 puffs into the lungs every 4 (four) hours as needed for wheezing or shortness of breath.   amiodarone 200 MG tablet Commonly known as:  PACERONE Take 1 tablet (200 mg total) by mouth 2 (two) times daily. Start taking on:  09/03/2017   aspirin EC 81 MG tablet Take 81 mg by mouth daily.   atorvastatin 80 MG tablet Commonly known as:  LIPITOR TAKE 1 TABLET BY MOUTH EVERY DAY AT 6PM What changed:  See the new instructions.   blood glucose meter kit and supplies Kit Dispense based on patient and insurance preference. Use up to four times daily as directed. (FOR ICD-9 250.00, 250.01).  diltiazem 120 MG 12 hr capsule Commonly known as:  CARDIZEM SR Take 1 capsule (120 mg total) by mouth every 12 (twelve) hours.   docusate sodium 100 MG capsule Commonly known as:  COLACE Take 100 mg by mouth 2 (two) times daily.   DULoxetine 60 MG capsule Commonly known as:  CYMBALTA Take 60 mg by mouth every evening.   furosemide 40 MG tablet Commonly known as:  LASIX Take 1 tablet (40 mg total) by mouth daily. Start taking on:  09/03/2017   JARDIANCE 10 MG Tabs  tablet Generic drug:  empagliflozin Take 10 mg by mouth daily.   magnesium oxide 400 (241.3 Mg) MG tablet Commonly known as:  MAG-OX Take 1 tablet (400 mg total) by mouth daily.   Metoprolol Tartrate 37.5 MG Tabs Take 37.5 mg by mouth 2 (two) times daily.   multivitamin with minerals Tabs tablet Take 1 tablet by mouth daily.   nitroGLYCERIN 0.4 MG SL tablet Commonly known as:  NITROSTAT PLACE 1 TABLET UNDER TONGUE EVERY 5 MINUTES AS NEEDED FOR CHEST PAIN MAX 3 TABLETS What changed:  See the new instructions.   NON FORMULARY at bedtime. CPAP   nystatin powder Commonly known as:  MYCOSTATIN/NYSTOP Apply 1 application topically 2 (two) times daily as needed for rash.   Oxycodone HCl 10 MG Tabs Take 1 tablet (10 mg total) by mouth every 4 (four) hours as needed for severe pain. What changed:    medication strength  how much to take  reasons to take this   pantoprazole 40 MG tablet Commonly known as:  PROTONIX TAKE 1 TABLET BY MOUTH EVERY DAY   potassium chloride 10 MEQ tablet Commonly known as:  K-DUR,KLOR-CON TAKE 1 TABLET BY MOUTH DAILY   rivaroxaban 20 MG Tabs tablet Commonly known as:  XARELTO Take 1 tablet (20 mg total) daily with supper by mouth.      Follow-up Information    Ivin Poot, MD Follow up on 09/30/2017.   Specialty:  Cardiothoracic Surgery Why:  Appointment is at 11:00, please get CXR at 10:30 at [facility] located on first floor of our office building  Contact information: [place] [address] [phone]        Lendon Colonel, NP Follow up on 09/15/2017.   Specialties:  Nurse Practitioner, Radiology, Cardiology Why:  Appointment is at 10:30 Contact information: [place] [id number] [place] Follow up on 09/11/2017.   Specialty:  Cardiology Why:  Your follow up AFib clinic appointment will on 09/11/17 at  0830am. The garage code will be 1600.  Contact information: [address] [id number] [place] [phone]          Signed: [name] 09/02/2017, 2:06 PM  patient examined and medical record reviewed,agree with above note. [name] 09/10/2017

## 2017-08-26 NOTE — Progress Notes (Signed)
Progress Note  Patient Name: Paul Todd   Date of Encounter: 08/26/2017  Primary Cardiologist: [place]  Subjective   Doing well today, sitting up in chair. Pain is manageable.  Inpatient Medications    Scheduled Meds: . acetaminophen  1,000 mg Oral Q6H   Or  . acetaminophen (TYLENOL) oral liquid 160 mg/5 mL  1,000 mg Per Tube Q6H  . amiodarone  400 mg Oral BID  . aspirin EC  325 mg Oral Daily   Or  . aspirin  324 mg Per Tube Daily  . atorvastatin  80 mg Oral q1800  . bisacodyl  10 mg Oral Daily   Or  . bisacodyl  10 mg Rectal Daily  . chlorhexidine  15 mL Mouth Rinse BID  . Chlorhexidine Gluconate Cloth  6 each Topical Daily  . docusate sodium  200 mg Oral Daily  . DULoxetine  60 mg Oral QPM  . furosemide  40 mg Intravenous BID  . insulin aspart  0-24 Units Subcutaneous Q4H  . insulin aspart  4 Units Subcutaneous TID WC  . insulin detemir  15 Units Subcutaneous BID  . ketorolac  15 mg Intravenous Q6H  . losartan  25 mg Oral Daily  . mouth rinse  15 mL Mouth Rinse q12n4p  . metoCLOPramide (REGLAN) injection  10 mg Intravenous Q6H  . metoprolol tartrate  12.5 mg Oral BID   Or  . metoprolol tartrate  12.5 mg Per Tube BID  . mometasone-formoterol  2 puff Inhalation BID  . pantoprazole  40 mg Oral Daily  . potassium chloride  20 mEq Oral BID  . sodium chloride flush  10-40 mL Intracatheter Q12H  . sodium chloride flush  3 mL Intravenous Q12H   Continuous Infusions: . sodium chloride Stopped (08/24/17 2025)  . sodium chloride Stopped (08/25/17 0512)  . sodium chloride 10 mL/hr at 08/25/17 0800  . lactated ringers    . lactated ringers 20 mL/hr at 08/25/17 0800  . potassium chloride 10 mEq (08/26/17 0815)   PRN Meds: sodium chloride, metoprolol tartrate, midazolam, morphine injection, ondansetron (ZOFRAN) IV, oxyCODONE, sodium chloride flush, sodium chloride flush, traMADol   Vital Signs    Vitals:   08/26/17 0700 08/26/17 0800 08/26/17 0803 08/26/17 0900    BP: 116/71 (!) 157/76    Pulse: 73 78  74  Resp: 17 (!) 39  (!) 21  Temp: 98.2 F (36.8 C)     TempSrc: Oral     SpO2: 97% 97% 96% 96%  Weight:      Height:        Intake/Output Summary (Last 24 hours) at 08/26/2017 0917 Last data filed at 08/26/2017 0900 Gross per 24 hour  Intake 493.4 ml  Output 4110 ml  Net -3616.6 ml    Filed Weights   08/24/17 0448 08/25/17 0500 08/26/17 0500  Weight: 118.6 kg 129.1 kg 121 kg    Telemetry    NSR - Personally Reviewed No evidence of atrial fibrillation  ECG    ECG NSR- personally reviewed  Physical Exam   BP (!) 157/76 (BP Location: Right Arm)   Pulse 74   Temp 98.2 F (36.8 C) (Oral)   Resp (!) 21   Ht 5\' 7"  (1.702 m)   Wt 121 kg   SpO2 96%   BMI 41.78 kg/m  General: Alert, oriented, no distress.  Skin: normal turgor, no rashes, warm and dry HEENT: Normocephalic, atraumatic. sclera anicteric; extraocular muscles intact Neck: No JVD, no carotid bruits;  normal carotid upstroke Lungs: clear bilaterally Chest wall: dressed sternal wound Heart: regular S1 and S2, distant heart sounds Abdomen: soft, nontender; BS+ Pulses 2+ bilateral radial and DP Musculoskeletal: moving limbs independently Extremities: trace bilateral LE edema Neurologic: grossly nonfocal; Cranial nerves grossly wnl Psychologic: Normal mood and affect   Labs    Chemistry Recent Labs  Lab 08/22/17 0405 08/24/17 0532  08/25/17 0355 08/25/17 1856 08/25/17 1900 08/26/17 0345  NA 141 142   < > 138  --  139 136  K 4.0 4.1   < > 4.2  --  3.8 3.7  CL 112* 109   < > 111  --  101 103  CO2 23 24  --  22  --   --  25  GLUCOSE 123* 129*   < > 124*  --  158* 137*  BUN 12 10   < > 9  --  8 10  CREATININE 0.86 0.93   < > 0.90 1.00 0.80 0.87  CALCIUM 8.4* 9.3  --  7.5*  --   --  8.1*  PROT 6.8  --   --   --   --   --   --   ALBUMIN 3.6  --   --   --   --   --   --   AST 29  --   --   --   --   --   --   ALT 41  --   --   --   --   --   --   ALKPHOS 79   --   --   --   --   --   --   BILITOT 0.8  --   --   --   --   --   --   GFRNONAA >60 >60   < > >60 >60  --  >60  GFRAA >60 >60   < > >60 >60  --  >60  ANIONGAP 6 9  --  5  --   --  8   < > = values in this interval not displayed.     Hematology Recent Labs  Lab 08/25/17 0355 08/25/17 1856 08/25/17 1900 08/26/17 0345  WBC 12.9* 18.1*  --  16.8*  RBC 3.48* 4.05*  --  3.82*  HGB 10.3* 11.9* 12.2* 11.2*  HCT 32.4* 37.9* 36.0* 35.7*  MCV 93.1 93.6  --  93.5  MCH 29.6 29.4  --  29.3  MCHC 31.8 31.4  --  31.4  RDW 14.6 14.9  --  15.4  PLT 120* 135*  --  129*    Cardiac Enzymes Recent Labs  Lab 08/21/17 0342  TROPONINI <0.03    Recent Labs  Lab 08/20/17 1836 08/21/17 0009  TROPIPOC 0.03 0.00     BNPNo results for input(s): BNP, PROBNP in the last 168 hours.   DDimer No results for input(s): DDIMER in the last 168 hours.   Lipid Panel     Component Value Date/Time   CHOL 195 01/28/2013 0939   TRIG 206 (H) 01/28/2013 0939   HDL 43 01/28/2013 0939   CHOLHDL 5.5 11/23/2012 0949   VLDL 46 (H) 11/23/2012 0949   LDLCALC 111 (H) 01/28/2013 0939    Radiology    Dg Chest Port 1 View  Result Date: 08/26/2017 CLINICAL DATA:  Chest tube, CABG EXAM: PORTABLE CHEST 1 VIEW COMPARISON:  08/25/2017 FINDINGS: Bilateral chest tubes remain in place, unchanged. No pneumothorax. Interval  removal of endotracheal tube, NG tube and Swan-Ganz catheter. Cardiomegaly with vascular congestion. Low volumes with bibasilar atelectasis. IMPRESSION: Bilateral chest tubes remain in place without pneumothorax. Low volumes with vascular congestion and bibasilar atelectasis. Electronically Signed   By: Rolm Baptise M.D.   On: 08/26/2017 08:24   Dg Chest Port 1 View  Result Date: 08/25/2017 CLINICAL DATA:  Intubation. EXAM: PORTABLE CHEST 1 VIEW COMPARISON:  08/24/2017. FINDINGS: Endotracheal tube, Swan-Ganz catheter, bilateral chest tubes in stable position. Right subclavian line stable position with  its tip again projected over the innominate vein. Prior CABG. Stable cardiomegaly. Low lung volumes with bibasilar atelectasis again noted. Left lung base infiltrate noted on today's exam. IMPRESSION: 1.  Lines and tubes in stable position. 2.  Prior CABG.  Cardiomegaly. 3. Low lung volumes with bibasilar atelectasis. Left base infiltrate noted on today's exam. Electronically Signed   By: Marcello Moores  Register   On: 08/25/2017 06:55   Dg Chest Port 1 View  Result Date: 08/24/2017 CLINICAL DATA:  Followup CABG EXAM: PORTABLE CHEST 1 VIEW COMPARISON:  Earlier same day. FINDINGS: Median sternotomy and CABG. Endotracheal tube tip is 5 cm above the carina. Nasogastric or orogastric tube enters the stomach. Swan-Ganz catheter inserted from a left jugular approach has its tip in the right main pulmonary artery. Bilateral chest tubes in place. No pneumothorax. Right subclavian central line tip appears to cross into the innominate vein. The lungs are clear. No pulmonary edema. IMPRESSION: Status post CABG. No pneumothorax. Lungs well aerated. No edema. Lines and tubes well positioned, with the exception that the right subclavian central line appears to cross over into the innominate vein Electronically Signed   By: Nelson Chimes M.D.   On: 08/24/2017 15:14    Cardiac Studies   Cath/PCI to RCA 02/20/2013; staged PCI to LAD 02/14/2013  Echo 08/21/17 - Left ventricle: The cavity size was normal. Wall thickness was   increased in a pattern of mild LVH. Systolic function was normal.   The estimated ejection fraction was in the range of 55% to 60%.   Wall motion was normal; there were no regional wall motion   abnormalities. Doppler parameters are consistent with abnormal   left ventricular relaxation (grade 1 diastolic dysfunction).  Cath 08/21/17  The left ventricular ejection fraction is 55-65% by visual estimate.  LV end diastolic pressure is normal.  The left ventricular systolic function is  normal.  Previously placed Prox RCA to Mid RCA stent (unknown type) is widely patent.  Dist LM lesion is 75% stenosed.  Ost LAD lesion is 75% stenosed.  Previously placed Prox LAD stent (unknown type) is widely patent.   1. Severe distal left main and ostial LAD stenosis confirmed by positive FFR analysis (0.76) 2. Continued patency of the stented segments in the LAD and RCA 3. Patent left circumflex 4. Normal LV systolic function  Recommend: TCTS consult for CABG. IV heparin, inpatient status with severe left main stenosis.  Op note 08/24/17 CORONARY ARTERY BYPASS GRAFTING (CABG) X3 -LIMA to LAD -SVG to OM -SVG to RAMUS INTERMEDIATE  Patient Profile     57 year old man with known coronary disease and peripheral vascular disease, who presents with an episode of chest discomfort concerning for unstable angina. Found to have severe disease, recommended for CABG. Now s/p CABG 08/24/17  Assessment & Plan    1. CAD, s/p CABG 08/24/17 -Doing well, sitting in chair today.  -On aspirin 324 mg, metoprolol, amiodarone (see below), furosemide, atorvastatin -change to aspirin 81  mg and rivaroxaban prior to discharge -telemetry with NSR, rare PVCs, no afib  2. OSA--occasional apnea on telemetry, CPAP use is beneficial  3. History of paroxysmal afib: NSR postoperatively. Currently on amiodarone for prevention. Was on dofetilide as an outpatient. If he continues to progress, will plan to reload so that he can have 5 doses prior to discharge. Will need to make sure his electrolytes and renal function are stable and that there are no medication contraindications. Will stop amiodarone in anticipation of this to avoid affecting QTc. Anticipate possible first dose 8/23 AM.  4. History of DVT/PE: on rivaroxaban as an outpatient, restart when safe from a surgical standpoint  5. Hypertension: blood pressure rising, agree with restarting losartan for BP control, renal function stable.   Buford Dresser, MD, PhD [facility]  [address], [place], [place] [address] [phone]

## 2017-08-26 NOTE — Progress Notes (Signed)
Patient refused home CPAP unit at bedside at this time. States he "did not rest well with it last night".  Encouraged patient to call for RT if desired. RN at bedside. Patient currently on 2 lpm [place]. Spo2 98%.

## 2017-08-27 ENCOUNTER — Inpatient Hospital Stay (HOSPITAL_COMMUNITY): Payer: BLUE CROSS/BLUE SHIELD

## 2017-08-27 DIAGNOSIS — Z86718 Personal history of other venous thrombosis and embolism: Secondary | ICD-10-CM

## 2017-08-27 DIAGNOSIS — Z86711 Personal history of pulmonary embolism: Secondary | ICD-10-CM

## 2017-08-27 DIAGNOSIS — E119 Type 2 diabetes mellitus without complications: Secondary | ICD-10-CM

## 2017-08-27 LAB — BPAM RBC
Blood Product Expiration Date: 201908292359
Blood Product Expiration Date: 201909042359
Unit Type and Rh: 600
Unit Type and Rh: 600

## 2017-08-27 LAB — TYPE AND SCREEN
ABO/RH(D): A NEG
Antibody Screen: NEGATIVE
Unit division: 0
Unit division: 0

## 2017-08-27 LAB — BASIC METABOLIC PANEL
Anion gap: 7 (ref 5–15)
BUN: 14 mg/dL (ref 6–20)
CO2: 27 mmol/L (ref 22–32)
Calcium: 8 mg/dL — ABNORMAL LOW (ref 8.9–10.3)
Chloride: 106 mmol/L (ref 98–111)
Creatinine, Ser: 0.91 mg/dL (ref 0.61–1.24)
GFR calc Af Amer: >60 mL/min (ref >60)
GFR calc non Af Amer: >60 mL/min (ref >60)
Glucose, Bld: 116 mg/dL — ABNORMAL HIGH (ref 70–99)
Potassium: 4.1 mmol/L (ref 3.5–5.1)
Sodium: 140 mmol/L (ref 135–145)

## 2017-08-27 LAB — CBC
HEMATOCRIT: 33.9 % — AB (ref 39.0–52.0)
Hemoglobin: 10.4 g/dL — ABNORMAL LOW (ref 13.0–17.0)
MCH: 29 pg (ref 26.0–34.0)
MCHC: 30.7 g/dL (ref 30.0–36.0)
MCV: 94.4 fL (ref 78.0–100.0)
PLATELETS: 117 10*3/uL — AB (ref 150–400)
RBC: 3.59 MIL/uL — ABNORMAL LOW (ref 4.22–5.81)
RDW: 15.4 % (ref 11.5–15.5)
WBC: 14.3 10*3/uL — ABNORMAL HIGH (ref 4.0–10.5)

## 2017-08-27 LAB — GLUCOSE, CAPILLARY
Glucose-Capillary: 95 mg/dL (ref 70–99)
Glucose-Capillary: 136 mg/dL — ABNORMAL HIGH (ref 70–99)
Glucose-Capillary: 151 mg/dL — ABNORMAL HIGH (ref 70–99)
Glucose-Capillary: 111 mg/dL — ABNORMAL HIGH (ref 70–99)
Glucose-Capillary: 97 mg/dL (ref 70–99)

## 2017-08-27 LAB — MAGNESIUM: Magnesium: 1.9 mg/dL (ref 1.7–2.4)

## 2017-08-27 MED ORDER — DOFETILIDE 125 MCG PO CAPS
250.0000 ug | ORAL_CAPSULE | Freq: Two times a day (BID) | ORAL | Status: DC
  Administered 2017-08-28 – 2017-09-01 (×9): 250 ug via ORAL
  Filled 2017-08-27 (×9): qty 2

## 2017-08-27 MED ORDER — ASPIRIN 325 MG PO TABS
325.0000 mg | ORAL_TABLET | Freq: Every day | ORAL | Status: AC
  Administered 2017-08-27: 325 mg via ORAL
  Filled 2017-08-27: qty 1

## 2017-08-27 MED ORDER — METOPROLOL TARTRATE 25 MG PO TABS
25.0000 mg | ORAL_TABLET | Freq: Two times a day (BID) | ORAL | Status: DC
  Administered 2017-08-27 – 2017-08-28 (×4): 25 mg via ORAL
  Filled 2017-08-27 (×5): qty 1

## 2017-08-27 MED ORDER — RIVAROXABAN 20 MG PO TABS
20.0000 mg | ORAL_TABLET | Freq: Every day | ORAL | Status: DC
  Administered 2017-08-28 – 2017-09-02 (×6): 20 mg via ORAL
  Filled 2017-08-27 (×6): qty 1

## 2017-08-27 MED ORDER — ASPIRIN EC 81 MG PO TBEC
81.0000 mg | DELAYED_RELEASE_TABLET | Freq: Every day | ORAL | Status: DC
  Administered 2017-08-28 – 2017-09-02 (×6): 81 mg via ORAL
  Filled 2017-08-27 (×6): qty 1

## 2017-08-27 MED ORDER — ASPIRIN EC 81 MG PO TBEC: 81.0000 mg | DELAYED_RELEASE_TABLET | Freq: Every day | ORAL | Status: DC

## 2017-08-27 NOTE — Progress Notes (Signed)
Progress Note  Patient Name: Paul Todd   Date of Encounter: 08/27/2017  Primary Cardiologist: Hilty  Subjective   Had two episodes of paroxysmal afib this AM, first from 5:51-6:30 AM and then 7:23-9:10 AM. He did feel these. Overall he is slowly improving, sternal pain being controlled, working on ambulation. Transferring to the floor today.  Inpatient Medications    Scheduled Meds: . acetaminophen  1,000 mg Oral Q6H  . [START ON 08/28/2017] aspirin EC  81 mg Oral Daily  . atorvastatin  80 mg Oral q1800  . bisacodyl  10 mg Oral Daily  . Chlorhexidine Gluconate Cloth  6 each Topical Daily  . docusate sodium  200 mg Oral Daily  . [START ON 08/28/2017] dofetilide  250 mcg Oral BID  . DULoxetine  60 mg Oral QPM  . furosemide  40 mg Intravenous BID  . insulin aspart  0-24 Units Subcutaneous TID WC  . insulin aspart  4 Units Subcutaneous TID WC  . insulin detemir  15 Units Subcutaneous BID  . losartan  25 mg Oral Daily  . metoprolol tartrate  25 mg Oral BID  . mometasone-formoterol  2 puff Inhalation BID  . pantoprazole  40 mg Oral Daily  . potassium chloride  20 mEq Oral BID  . [START ON 08/28/2017] rivaroxaban  20 mg Oral Q supper  . sodium chloride flush  10-40 mL Intracatheter Q12H  . sodium chloride flush  3 mL Intravenous Q12H   Continuous Infusions: . sodium chloride Stopped (08/25/17 0512)  . lactated ringers     PRN Meds: metoprolol tartrate, midazolam, morphine injection, ondansetron (ZOFRAN) IV, oxyCODONE, sodium chloride flush, sodium chloride flush, traMADol   Vital Signs    Vitals:   08/27/17 0500 08/27/17 0600 08/27/17 0812 08/27/17 0901  BP: (!) 142/55  110/70   Pulse: (!) 42 (!) 142 (!) 112   Resp: 16 (!) 24 (!) 23   Temp:   98.7 F (37.1 C)   TempSrc:   Oral   SpO2: 99% 94% 98% 95%  Weight: 119.6 kg     Height:        Intake/Output Summary (Last 24 hours) at 08/27/2017 0947 Last data filed at 08/27/2017 0500 Gross per 24 hour  Intake 384.76  ml  Output 1810 ml  Net -1425.24 ml    Filed Weights   08/25/17 0500 08/26/17 0500 08/27/17 0500  Weight: 129.1 kg 121 kg 119.6 kg    Telemetry    NSR - Personally Reviewed. Two episodes of afib early this AM as noted in HPI.  ECG    ECG NSR- personally reviewed  Physical Exam   BP 110/70   Pulse (!) 112   Temp 98.7 F (37.1 C) (Oral)   Resp (!) 23   Ht 5\' 7"  (1.702 m)   Wt 119.6 kg   SpO2 95%   BMI 41.30 kg/m  General: Alert, oriented, no distress.  Skin: normal turgor, no rashes, warm and dry HEENT: Normocephalic, atraumatic. sclera anicteric; extraocular muscles intact Neck: No JVD, no carotid bruits; normal carotid upstroke Lungs: clear bilaterally except for crackles at the very base bilaterally Chest wall: healing sternal incision Heart: regular S1 and S2, distant heart sounds Abdomen: soft, nontender; BS+ Pulses 2+ bilateral radial and DP Musculoskeletal: moving limbs independently Extremities: trace bilateral LE edema Neurologic: grossly nonfocal; Cranial nerves grossly wnl Psychologic: Normal mood and affect   Labs    Chemistry Recent Labs  Lab 08/22/17 0405  08/25/17 0355 08/25/17  1856 08/25/17 1900 08/26/17 0345 08/27/17 0352  NA 141   < > 138  --  139 136 140  K 4.0   < > 4.2  --  3.8 3.7 4.1  CL 112*   < > 111  --  101 103 106  CO2 23   < > 22  --   --  25 27  GLUCOSE 123*   < > 124*  --  158* 137* 116*  BUN 12   < > 9  --  8 10 14   CREATININE 0.86   < > 0.90 1.00 0.80 0.87 0.91  CALCIUM 8.4*   < > 7.5*  --   --  8.1* 8.0*  PROT 6.8  --   --   --   --   --   --   ALBUMIN 3.6  --   --   --   --   --   --   AST 29  --   --   --   --   --   --   ALT 41  --   --   --   --   --   --   ALKPHOS 79  --   --   --   --   --   --   BILITOT 0.8  --   --   --   --   --   --   GFRNONAA >60   < > >60 >60  --  >60 >60  GFRAA >60   < > >60 >60  --  >60 >60  ANIONGAP 6   < > 5  --   --  8 7   < > = values in this interval not displayed.      Hematology Recent Labs  Lab 08/25/17 1856 08/25/17 1900 08/26/17 0345 08/27/17 0247  WBC 18.1*  --  16.8* 14.3*  RBC 4.05*  --  3.82* 3.59*  HGB 11.9* 12.2* 11.2* 10.4*  HCT 37.9* 36.0* 35.7* 33.9*  MCV 93.6  --  93.5 94.4  MCH 29.4  --  29.3 29.0  MCHC 31.4  --  31.4 30.7  RDW 14.9  --  15.4 15.4  PLT 135*  --  129* 117*    Cardiac Enzymes Recent Labs  Lab 08/21/17 0342  TROPONINI <0.03    Recent Labs  Lab 08/20/17 1836 08/21/17 0009  TROPIPOC 0.03 0.00     BNPNo results for input(s): BNP, PROBNP in the last 168 hours.   DDimer No results for input(s): DDIMER in the last 168 hours.   Lipid Panel     Component Value Date/Time   CHOL 195 01/28/2013 0939   TRIG 206 (H) 01/28/2013 0939   HDL 43 01/28/2013 0939   CHOLHDL 5.5 11/23/2012 0949   VLDL 46 (H) 11/23/2012 0949   LDLCALC 111 (H) 01/28/2013 0939    Radiology    Dg Chest 2 View  Result Date: 08/27/2017 CLINICAL DATA:  Post chest tube removal. EXAM: CHEST - 2 VIEW COMPARISON:  08/26/2017 FINDINGS: Interval removal of left IJ line, mediastinal drainage catheter, bilateral chest tubes. Right subclavian line in unchanged position with tip projected over the left brachiocephalic vein. Prior CABG. Cardiomegaly with normal pulmonary vascularity. No pleural effusion or pneumothorax. IMPRESSION: 1. Interim removal of left IJ line, mediastinal drainage catheter, and bilateral chest tubes. Right subclavian line stable position. No pneumothorax. 2.  Prior CABG.  Stable cardiomegaly. Electronically Signed   By: Marcello Moores  Register   On: 08/27/2017 07:14   Dg Chest Port 1 View  Result Date: 08/26/2017 CLINICAL DATA:  Chest tube, CABG EXAM: PORTABLE CHEST 1 VIEW COMPARISON:  08/25/2017 FINDINGS: Bilateral chest tubes remain in place, unchanged. No pneumothorax. Interval removal of endotracheal tube, NG tube and Swan-Ganz catheter. Cardiomegaly with vascular congestion. Low volumes with bibasilar atelectasis. IMPRESSION:  Bilateral chest tubes remain in place without pneumothorax. Low volumes with vascular congestion and bibasilar atelectasis. Electronically Signed   By: Rolm Baptise M.D.   On: 08/26/2017 08:24    Cardiac Studies   Cath/PCI to RCA 02/20/2013; staged PCI to LAD 02/14/2013  Echo 08/21/17 - Left ventricle: The cavity size was normal. Wall thickness was   increased in a pattern of mild LVH. Systolic function was normal.   The estimated ejection fraction was in the range of 55% to 60%.   Wall motion was normal; there were no regional wall motion   abnormalities. Doppler parameters are consistent with abnormal   left ventricular relaxation (grade 1 diastolic dysfunction).  Cath 08/21/17  The left ventricular ejection fraction is 55-65% by visual estimate.  LV end diastolic pressure is normal.  The left ventricular systolic function is normal.  Previously placed Prox RCA to Mid RCA stent (unknown type) is widely patent.  Dist LM lesion is 75% stenosed.  Ost LAD lesion is 75% stenosed.  Previously placed Prox LAD stent (unknown type) is widely patent.   1. Severe distal left main and ostial LAD stenosis confirmed by positive FFR analysis (0.76) 2. Continued patency of the stented segments in the LAD and RCA 3. Patent left circumflex 4. Normal LV systolic function  Recommend: TCTS consult for CABG. IV heparin, inpatient status with severe left main stenosis.  Op note 08/24/17 CORONARY ARTERY BYPASS GRAFTING (CABG) X3 -LIMA to LAD -SVG to OM -SVG to RAMUS INTERMEDIATE  Patient Profile     57 year old man with known coronary disease and peripheral vascular disease, who presents with an episode of chest discomfort concerning for unstable angina. Found to have severe disease, recommended for CABG. Now s/p CABG 08/24/17  Assessment & Plan    1. CAD, s/p CABG 08/24/17 -Slowly recovering, moving to the floor today -On aspirin 81 mg, metoprolol 25 mg BID, furosemide, atorvastatin 80  mg -telemetry with NSR, rare PVCs, two episodes of afib this AM  2. OSA--occasional apnea on telemetry, CPAP use is beneficial  3. History of paroxysmal afib: NSR postoperatively until early this AM. Had been on amiodarone, but with plan to reload on dofetilide, this was stopped yesterday. Afib has been paroxysmal thus far. Ideally would like to give it another day off amiodarone before restarting dofetilide, though with long half life some amiodarone will still be present. However, as he wasn't fully loaded, the QTc should still ideally be stable for reload of dofetilide. Tentatively plan to start tomorrow AM. Appreciate pharmacy assistance with management. Will try to rate control pending dofetilide with metoprolol. Restarting anticoagulation tomorrow. Home dose of dofetilide was 250 mg BID.  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 Hypertension: blood pressure well controlled today, continue losartan for BP control, renal function stable.   Buford Dresser, MD, PhD [facility]  [address], [place], [place] [address] [phone]

## 2017-08-27 NOTE — Plan of Care (Signed)
  Problem: Health Behavior/Discharge Planning: Goal: Ability to manage health-related needs will improve Outcome: Progressing   Problem: Clinical Measurements: Goal: Ability to maintain clinical measurements within normal limits will improve Outcome: Progressing Goal: Will remain free from infection Outcome: Progressing   

## 2017-08-27 NOTE — Progress Notes (Addendum)
TCTS DAILY ICU PROGRESS NOTE                   [address]            [place] [address]          [phone]   3 Days Post-Op Procedure(s) (LRB): CORONARY ARTERY BYPASS GRAFTING (CABG) X3. RIGHT ENDOSCOPIC SAPHENOUS VEIN HARVEST. MAMM TO LAD, SVG TO OM, SVG TO RAMUS (N/A) TRANSESOPHAGEAL ECHOCARDIOGRAM (TEE) (N/A)  Total Length of Stay:  LOS: 5 days   Subjective:  No new complaints.  Developed Atrial Fibrillation yesterday.  + ambulation  + BM  Objective: Vital signs in last 24 hours: Temp:  [97.6 F (36.4 C)-98.3 F (36.8 C)] 98.1 F (36.7 C) (08/22 0352) Pulse Rate:  [41-142] 142 (08/22 0600) Cardiac Rhythm: Sinus tachycardia (08/22 0600) Resp:  [16-39] 24 (08/22 0600) BP: (109-157)/(46-79) 142/55 (08/22 0500) SpO2:  [93 %-99 %] 94 % (08/22 0600) Weight:  [119.6 kg] 119.6 kg (08/22 0500)  Filed Weights   08/25/17 0500 08/26/17 0500 08/27/17 0500  Weight: 129.1 kg 121 kg 119.6 kg    Weight change: -1.4 kg   Intake/Output from previous day: 08/21 0701 - 08/22 0700 In: 423.9 [I.V.:242.6; IV Piggyback:181.3] Out: 1935 [Urine:1875; Chest Tube:60]  Current Meds: Scheduled Meds: . acetaminophen  1,000 mg Oral Q6H  . aspirin EC  81 mg Oral Daily  . atorvastatin  80 mg Oral q1800  . bisacodyl  10 mg Oral Daily  . Chlorhexidine Gluconate Cloth  6 each Topical Daily  . docusate sodium  200 mg Oral Daily  . DULoxetine  60 mg Oral QPM  . furosemide  40 mg Intravenous BID  . insulin aspart  0-24 Units Subcutaneous TID WC  . insulin aspart  4 Units Subcutaneous TID WC  . insulin detemir  15 Units Subcutaneous BID  . losartan  25 mg Oral Daily  . metoCLOPramide (REGLAN) injection  10 mg Intravenous Q6H  . metoprolol tartrate  12.5 mg Oral BID  . mometasone-formoterol  2 puff Inhalation BID  . pantoprazole  40 mg Oral Daily  . potassium chloride  20 mEq Oral BID  . [START ON 08/28/2017] rivaroxaban  20 mg Oral Q supper  . sodium chloride flush  10-40  mL Intracatheter Q12H  . sodium chloride flush  3 mL Intravenous Q12H   Continuous Infusions: . sodium chloride Stopped (08/25/17 0512)  . lactated ringers     PRN Meds:.metoprolol tartrate, midazolam, morphine injection, ondansetron (ZOFRAN) IV, oxyCODONE, sodium chloride flush, sodium chloride flush, traMADol  General appearance: alert, cooperative and no distress Heart: irregularly irregular rhythm Lungs: diminished left base Abdomen: soft, non-tender; bowel sounds normal; no masses,  no organomegaly Extremities: edema trace Wound: clean and dry  Lab Results: CBC: Recent Labs    08/26/17 0345 08/27/17 0247  WBC 16.8* 14.3*  HGB 11.2* 10.4*  HCT 35.7* 33.9*  PLT 129* 117*   BMET:  Recent Labs    08/26/17 0345 08/27/17 0352  NA 136 140  K 3.7 4.1  CL 103 106  CO2 25 27  GLUCOSE 137* 116*  BUN 10 14  CREATININE 0.87 0.91  CALCIUM 8.1* 8.0*    CMET: Lab Results  Component Value Date   WBC 14.3 (H) 08/27/2017   HGB 10.4 (L) 08/27/2017   HCT 33.9 (L) 08/27/2017   PLT 117 (L) 08/27/2017   GLUCOSE 116 (H) 08/27/2017   CHOL 195 01/28/2013   TRIG 206 (H)  01/28/2013   HDL 43 01/28/2013   LDLCALC 111 (H) 01/28/2013   ALT 41 08/22/2017   AST 29 08/22/2017   NA 140 08/27/2017   K 4.1 08/27/2017   CL 106 08/27/2017   CREATININE 0.91 08/27/2017   BUN 14 08/27/2017   CO2 27 08/27/2017   TSH 0.819 08/22/2017   PSA 1.65 11/23/2012   INR 1.27 08/24/2017   HGBA1C 7.4 (H) 08/22/2017      PT/INR:  Recent Labs    08/24/17 1446  LABPROT 15.7*  INR 1.27   Radiology: Dg Chest 2 View  Result Date: 08/27/2017 CLINICAL DATA:  Post chest tube removal. EXAM: CHEST - 2 VIEW COMPARISON:  08/26/2017 FINDINGS: Interval removal of left IJ line, mediastinal drainage catheter, bilateral chest tubes. Right subclavian line in unchanged position with tip projected over the left brachiocephalic vein. Prior CABG. Cardiomegaly with normal pulmonary vascularity. No pleural effusion  or pneumothorax. IMPRESSION: 1. Interim removal of left IJ line, mediastinal drainage catheter, and bilateral chest tubes. Right subclavian line stable position. No pneumothorax. 2.  Prior CABG.  Stable cardiomegaly. Electronically Signed   By: Marcello Moores  Register   On: 08/27/2017 07:14     Assessment/Plan: S/P Procedure(s) (LRB): CORONARY ARTERY BYPASS GRAFTING (CABG) X3. RIGHT ENDOSCOPIC SAPHENOUS VEIN HARVEST. MAMM TO LAD, SVG TO OM, SVG TO RAMUS (N/A) TRANSESOPHAGEAL ECHOCARDIOGRAM (TEE) (N/A)  1. CV- PAF, rate into the 140s- will increase Lopressor to 25 mg BID, continue Cozaar BP is improved, per Cardiology will reload with dofetilide once appropriate, Amiodarone has been stopped 2. Pulm- small left pleural effusion, wean oxygen as tolerated, continue IS 3. Renal- creatinine WNL, K is at 4.1, continue IV Lasix for now, supplement K 4. DM- sugars well controlled, continue current insulin regimen 5. Dispo- patient stable, went into Atrial Fibrillation yesterday, Amiodarone stopped to restart dofetilide per Cardiology, will remove EPW today, resume Xarelto tomorrow, continue IV diuresis, awaiting bed on 4E     Paul Todd 08/27/2017 7:54 AM\  patient  and medical record reviewed,agree with above note. Paul Todd 08/28/2017

## 2017-08-27 NOTE — Progress Notes (Signed)
CARDIAC REHAB PHASE I   PRE:  Rate/Rhythm: 81 SR  BP:  Sitting: 122/77      SaO2: 96 1L  MODE:  Ambulation: 300 ft   POST:  Rate/Rhythm: 89 SR  BP:  Sitting: 122/73    SaO2: 97 1L   Pt ambulated 31ft in hallway with front wheel walker. Pt took 2 standing rest breaks, c/o some SOB. Pt guided through purse lipped breathing. Pt c/o slight incisional pain upon return to room. Pt demonstrated 1000 on IS. Encouraged pt to walk, and continue IS use. Call bell and phone within reach.   [phone] Rufina Falco, RN BSN 08/27/2017 2:27 PM

## 2017-08-27 NOTE — Progress Notes (Signed)
Patient refused CPAP for the night  

## 2017-08-27 NOTE — Progress Notes (Signed)
Pt received from [facility]. Oriented to room and equipment. CHG completed. Telemetry applied, CCMD notified. VSS.   Fritz Pickerel, RN

## 2017-08-27 NOTE — Progress Notes (Signed)
Pacer wires pulled per order.  Pt tolerated well.  Gauze dressing applied. Pt instructed on 1 hour of bedrest.    Karsten Ro, RN

## 2017-08-28 DIAGNOSIS — Z5181 Encounter for therapeutic drug level monitoring: Secondary | ICD-10-CM

## 2017-08-28 DIAGNOSIS — I4892 Unspecified atrial flutter: Secondary | ICD-10-CM

## 2017-08-28 DIAGNOSIS — Z79899 Other long term (current) drug therapy: Secondary | ICD-10-CM

## 2017-08-28 LAB — BASIC METABOLIC PANEL
ANION GAP: 7 (ref 5–15)
BUN: 21 mg/dL — ABNORMAL HIGH (ref 6–20)
CO2: 28 mmol/L (ref 22–32)
CREATININE: 0.91 mg/dL (ref 0.61–1.24)
Calcium: 8.5 mg/dL — ABNORMAL LOW (ref 8.9–10.3)
Chloride: 106 mmol/L (ref 98–111)
GFR calc non Af Amer: >60 mL/min (ref >60)
Glucose, Bld: 126 mg/dL — ABNORMAL HIGH (ref 70–99)
Potassium: 3.9 mmol/L (ref 3.5–5.1)
SODIUM: 141 mmol/L (ref 135–145)

## 2017-08-28 LAB — CBC
HCT: 34.4 % — ABNORMAL LOW (ref 39.0–52.0)
Hemoglobin: 10.7 g/dL — ABNORMAL LOW (ref 13.0–17.0)
MCH: 29.3 pg (ref 26.0–34.0)
MCHC: 31.1 g/dL (ref 30.0–36.0)
MCV: 94.2 fL (ref 78.0–100.0)
Platelets: 173 10*3/uL (ref 150–400)
RBC: 3.65 MIL/uL — AB (ref 4.22–5.81)
RDW: 15.6 % — ABNORMAL HIGH (ref 11.5–15.5)
WBC: 12.8 10*3/uL — AB (ref 4.0–10.5)

## 2017-08-28 LAB — GLUCOSE, CAPILLARY
Glucose-Capillary: 112 mg/dL — ABNORMAL HIGH (ref 70–99)
Glucose-Capillary: 128 mg/dL — ABNORMAL HIGH (ref 70–99)
Glucose-Capillary: 96 mg/dL (ref 70–99)
Glucose-Capillary: 116 mg/dL — ABNORMAL HIGH (ref 70–99)

## 2017-08-28 LAB — MAGNESIUM: Magnesium: 2.1 mg/dL (ref 1.7–2.4)

## 2017-08-28 MED ORDER — DILTIAZEM HCL-DEXTROSE 100-5 MG/100ML-% IV SOLN (PREMIX)
5.0000 mg/h | INTRAVENOUS | Status: DC
  Administered 2017-08-28: 15 mg/h via INTRAVENOUS
  Administered 2017-08-28 (×3): 10 mg/h via INTRAVENOUS
  Administered 2017-08-29 (×2): 15 mg/h via INTRAVENOUS
  Filled 2017-08-28 (×5): qty 100

## 2017-08-28 MED ORDER — SORBITOL 70 % SOLN
60.0000 mL | Freq: Once | Status: AC
  Administered 2017-08-28: 60 mL via ORAL
  Filled 2017-08-28: qty 60

## 2017-08-28 MED ORDER — SORBITOL 70 % PO SOLN
60.0000 mL | Freq: Once | ORAL | Status: DC
  Filled 2017-08-28 (×2): qty 60

## 2017-08-28 MED ORDER — FUROSEMIDE 40 MG PO TABS
40.0000 mg | ORAL_TABLET | Freq: Every day | ORAL | Status: DC
  Administered 2017-08-28 – 2017-08-30 (×3): 40 mg via ORAL
  Filled 2017-08-28 (×3): qty 1

## 2017-08-28 NOTE — Plan of Care (Signed)
  Problem: Health Behavior/Discharge Planning: °Goal: Ability to manage health-related needs will improve °Outcome: Progressing °  °Problem: Clinical Measurements: °Goal: Ability to maintain clinical measurements within normal limits will improve °Outcome: Progressing °  °Problem: Education: °Goal: Knowledge of disease or condition will improve °Outcome: Progressing °  °

## 2017-08-28 NOTE — Progress Notes (Signed)
Progress Note  Patient Name: Paul Todd Date of Encounter: 08/28/2017  Primary Cardiologist: Pixie Casino, MD   Subjective   Doing ok. Out of bed, sitting in a chair. Remains in atrial flutter w/ rates in the 150s but currently asymptomatic.   Inpatient Medications    Scheduled Meds: . acetaminophen  1,000 mg Oral Q6H  . aspirin EC  81 mg Oral Daily  . atorvastatin  80 mg Oral q1800  . bisacodyl  10 mg Oral Daily  . Chlorhexidine Gluconate Cloth  6 each Topical Daily  . docusate sodium  200 mg Oral Daily  . dofetilide  250 mcg Oral BID  . DULoxetine  60 mg Oral QPM  . furosemide  40 mg Oral Daily  . insulin aspart  0-24 Units Subcutaneous TID WC  . insulin aspart  4 Units Subcutaneous TID WC  . insulin detemir  15 Units Subcutaneous BID  . losartan  25 mg Oral Daily  . metoprolol tartrate  25 mg Oral BID  . mometasone-formoterol  2 puff Inhalation BID  . pantoprazole  40 mg Oral Daily  . potassium chloride  20 mEq Oral BID  . rivaroxaban  20 mg Oral Q supper  . sodium chloride flush  10-40 mL Intracatheter Q12H  . sodium chloride flush  3 mL Intravenous Q12H   Continuous Infusions: . sodium chloride Stopped (08/25/17 0512)  . diltiazem (CARDIZEM) infusion 10 mg/hr (08/28/17 0401)  . lactated ringers     PRN Meds: metoprolol tartrate, ondansetron (ZOFRAN) IV, oxyCODONE, sodium chloride flush, sodium chloride flush, traMADol   Vital Signs    Vitals:   08/28/17 0356 08/28/17 0456 08/28/17 0746 08/28/17 1032  BP:  (!) 122/95 (!) 101/56 103/69  Pulse:   (!) 127 (!) 150  Resp: (!) 21 20 (!) 23   Temp:   97.7 F (36.5 C)   TempSrc:   Oral   SpO2:  98% 95%   Weight: 117.5 kg     Height:        Intake/Output Summary (Last 24 hours) at 08/28/2017 1050 Last data filed at 08/28/2017 0900 Gross per 24 hour  Intake 3 ml  Output 1125 ml  Net -1122 ml   Filed Weights   08/26/17 0500 08/27/17 0500 08/28/17 0356  Weight: 121 kg 119.6 kg 117.5 kg     Telemetry    Atrial flutter 150s - Personally Reviewed  ECG    EKG 08/27/17 0505 NSR w/ PACs - Personally Reviewed  Physical Exam   GEN: Moderately obese WM, in No acute distress.   Neck: No JVD Cardiac: irregular rhythm, tachy rate, no murmurs, rubs, or gallops.  Respiratory: bilateral crackles at the bases likely atelectasis  GI: Soft, nontender, non-distended  MS: trace bilateral LEE edema; No deformity. Neuro:  Nonfocal  Psych: Normal affect   Labs    Chemistry Recent Labs  Lab 08/22/17 0405  08/26/17 0345 08/27/17 0352 08/28/17 0330  NA 141   < > 136 140 141  K 4.0   < > 3.7 4.1 3.9  CL 112*   < > 103 106 106  CO2 23   < > 25 27 28   GLUCOSE 123*   < > 137* 116* 126*  BUN 12   < > 10 14 21*  CREATININE 0.86   < > 0.87 0.91 0.91  CALCIUM 8.4*   < > 8.1* 8.0* 8.5*  PROT 6.8  --   --   --   --  ALBUMIN 3.6  --   --   --   --   AST 29  --   --   --   --   ALT 41  --   --   --   --   ALKPHOS 79  --   --   --   --   BILITOT 0.8  --   --   --   --   GFRNONAA >60   < > >60 >60 >60  GFRAA >60   < > >60 >60 >60  ANIONGAP 6   < > 8 7 7    < > = values in this interval not displayed.     Hematology Recent Labs  Lab 08/26/17 0345 08/27/17 0247 08/28/17 0330  WBC 16.8* 14.3* 12.8*  RBC 3.82* 3.59* 3.65*  HGB 11.2* 10.4* 10.7*  HCT 35.7* 33.9* 34.4*  MCV 93.5 94.4 94.2  MCH 29.3 29.0 29.3  MCHC 31.4 30.7 31.1  RDW 15.4 15.4 15.6*  PLT 129* 117* 173    Cardiac EnzymesNo results for input(s): TROPONINI in the last 168 hours. No results for input(s): TROPIPOC in the last 168 hours.   BNPNo results for input(s): BNP, PROBNP in the last 168 hours.   DDimer No results for input(s): DDIMER in the last 168 hours.   Radiology    Dg Chest 2 View  Result Date: 08/27/2017 CLINICAL DATA:  Post chest tube removal. EXAM: CHEST - 2 VIEW COMPARISON:  08/26/2017 FINDINGS: Interval removal of left IJ line, mediastinal drainage catheter, bilateral chest tubes. Right  subclavian line in unchanged position with tip projected over the left brachiocephalic vein. Prior CABG. Cardiomegaly with normal pulmonary vascularity. No pleural effusion or pneumothorax. IMPRESSION: 1. Interim removal of left IJ line, mediastinal drainage catheter, and bilateral chest tubes. Right subclavian line stable position. No pneumothorax. 2.  Prior CABG.  Stable cardiomegaly. Electronically Signed   By: Marcello Moores  Register   On: 08/27/2017 07:14    Cardiac Studies   Cath/PCI to RCA 02/20/2013; staged PCI to LAD 02/14/2013  Echo 08/21/17 - Left ventricle: The cavity size was normal. Wall thickness was increased in a pattern of mild LVH. Systolic function was normal. The estimated ejection fraction was in the range of 55% to 60%. Wall motion was normal; there were no regional wall motion abnormalities. Doppler parameters are consistent with abnormal left ventricular relaxation (grade 1 diastolic dysfunction).  Cath 08/21/17  The left ventricular ejection fraction is 55-65% by visual estimate.  LV end diastolic pressure is normal.  The left ventricular systolic function is normal.  Previously placed Prox RCA to Mid RCA stent (unknown type) is widely patent.  Dist LM lesion is 75% stenosed.  Ost LAD lesion is 75% stenosed.  Previously placed Prox LAD stent (unknown type) is widely patent.  1. Severe distal left main and ostial LAD stenosis confirmed by positive FFR analysis (0.76) 2. Continued patency of the stented segments in the LAD and RCA 3. Patent left circumflex 4. Normal LV systolic function  Recommend: TCTS consult for CABG. IV heparin, inpatient status with severe left main stenosis.  Op note 08/24/17 CORONARY ARTERY BYPASS GRAFTING (CABG) X3 -LIMA to LAD -SVG to OM -SVG to RAMUS INTERMEDIATE  Patient Profile   57 year old man with known coronary disease and peripheral vascular disease, atrial fibrillation on Tikosyn and Xarelto and DM, who  presented with an episode of chest discomfort concerning for unstable angina. Found to have severe disease on cath, recommended for CABG. Now  s/p CABG 08/24/17. Post op recovery c/b atrial flutter w/ RVR.   Assessment & Plan    1. CAD: recently found to have severe multivessel disease on cath, now POD4 s/p CABG x 3 (LIMA-LAD, SVG-OM, SVG- RI). EF normal at 55-60%. Progressing ok, with the exception of post operative atrial flutter. Slightly volume overloaded with trace bilateral LEE and mild bibasilar crackles, likely mostly atelectasis. Continue Lasix. Pt encouraged to continue IS. Post op management per CT surgery. Continue medical therapy for secondary prevention of CAD. ASA, high intensity statin and BB. Continue management of additional cardiac risk factors. Currently on insulin for DM management. No recent Lipid panel on file (last was 2015, LDL was 111 mg/dL). Will obtain FLP in the AM to assess baseline LDL. Goal LDL in the setting of CAD is < 70 mg/dL.   2. Atrial Fibrillation/Flutter: prior h/o PAF, treated with Tikosyn and Xarelto for a/c. Was in NSR prior to CABG and initially post op. Now w/ post operative atrial flutter w/ RVR. Required short treatment with IV amiodarone. Tikosyn is being reloaded. Remains in atrial flutter with rates in the 150s. Tikosyn order set in place. Will monitor QT/QTc with reloading as well as Mg and K. I discussed EKGs with RN. Usually done 3 hrs post Tikosyn dose. We will follow. Plan is to restart Xarelto tonight. Continue to monitor on tele. Keep electrolytes stable.   3. DM: on insulin. Management per primary team.    For questions or updates, please contact [place] Please consult [email or web address] for contact info under Cardiology/STEMI.      Signed, Lyda Jester, PA-C  08/28/2017, 10:50 AM

## 2017-08-28 NOTE — Progress Notes (Signed)
CARDIAC REHAB PHASE I   Declined to walk the pt, pts HR 115-145. Completed d/c ed with pt and family. Pt instructed to shower daily and monitor incisions. Reinforced sternal precautions, and IS use. Pt given heart healthy and diabetic diets. Reviewed restrictions and exercise guidelines. Will refer to CRP II [place].  [id number] Rufina Falco, RN BSN 08/28/2017 3:26 PM

## 2017-08-28 NOTE — Progress Notes (Addendum)
      [address]       [place] [address]             [phone]      4 Days Post-Op Procedure(s) (LRB): CORONARY ARTERY BYPASS GRAFTING (CABG) X3. RIGHT ENDOSCOPIC SAPHENOUS VEIN HARVEST. MAMM TO LAD, SVG TO OM, SVG TO RAMUS (N/A) TRANSESOPHAGEAL ECHOCARDIOGRAM (TEE) (N/A)   Subjective:  Doesn't feel as well today.  Has some left sided shoulder discomfort and states he feels lousy when he is in A.Fib  Objective: Vital signs in last 24 hours: Temp:  [98 F (36.7 C)-98.7 F (37.1 C)] 98.1 F (36.7 C) (08/23 0316) Pulse Rate:  [112] 112 (08/22 0812) Cardiac Rhythm: Atrial fibrillation (08/23 0304) Resp:  [20-24] 20 (08/23 0456) BP: (107-127)/(61-95) 122/95 (08/23 0456) SpO2:  [92 %-98 %] 98 % (08/23 0456) Weight:  [117.5 kg] 117.5 kg (08/23 0356)  Intake/Output from previous day: 08/22 0701 - 08/23 0700 In: -  Out: 1125 [Urine:1125]  General appearance: alert, cooperative and no distress Heart: regular rate and rhythm Lungs: diminished breath sounds bibasilar Abdomen: soft, non-tender; bowel sounds normal; no masses,  no organomegaly Extremities: edema trace Wound: clean and ry  Lab Results: Recent Labs    08/27/17 0247 08/28/17 0330  WBC 14.3* 12.8*  HGB 10.4* 10.7*  HCT 33.9* 34.4*  PLT 117* 173   BMET:  Recent Labs    08/27/17 0352 08/28/17 0330  NA 140 141  K 4.1 3.9  CL 106 106  CO2 27 28  GLUCOSE 116* 126*  BUN 14 21*  CREATININE 0.91 0.91  CALCIUM 8.0* 8.5*    PT/INR: No results for input(s): LABPROT, INR in the last 72 hours. ABG    Component Value Date/Time   PHART 7.421 08/26/2017 0506   HCO3 25.8 08/26/2017 0506   TCO2 27 08/26/2017 0506   ACIDBASEDEF 2.0 08/25/2017 1821   O2SAT 99.0 08/26/2017 0506   CBG (last 3)  Recent Labs    08/27/17 1639 08/27/17 2134 08/28/17 0654  GLUCAP 111* 97 112*    Assessment/Plan: S/P Procedure(s) (LRB): CORONARY ARTERY BYPASS GRAFTING (CABG) X3. RIGHT ENDOSCOPIC SAPHENOUS  VEIN HARVEST. MAMM TO LAD, SVG TO OM, SVG TO RAMUS (N/A) TRANSESOPHAGEAL ECHOCARDIOGRAM (TEE) (N/A)  1. CV- Atrial Flutter- rate in the 140s- continue Lopressor, Cardizem, Cozaar.. Plan to restart dofetilide today, restart Xarelto 2. Pulm- no acute issues, off oxygen, continue IS 3. Renal- creatinine WNL, weight is trending down, transition to oral Lasix, continue K 4. DM- sugars controlled 5. Dispo- patient stable, restart dofetilide today, restart Xarelto, in Atrial Flutter with rate in the 140s has prn Lopressor ordered, shoulder pain likely musculoskeletal gave heat pack, patient stable, will remain in hospital until HR improved and dofetilide loading complete   LOS: 6 days    [name] 08/28/2017 Cont medical treatment of afib-flutter- iv diltiazem, metoprolol and tikosyn Home dose xarelto resumed  patient examined and medical record reviewed,agree with above note. [name] 08/28/2017

## 2017-08-28 NOTE — Discharge Instructions (Addendum)
Discharge Instructions:  1. You may shower, please wash incisions daily with soap and water and keep dry.  If you wish to cover wounds with dressing you may do so but please keep clean and change daily.  No tub baths or swimming until incisions have completely healed.  If your incisions become red or develop any drainage please call our office at [phone]  2. No Driving until cleared by Dr. Lucianne Lei Trigt's office and you are no longer using narcotic pain medications  3. Monitor your weight daily.. Please use the same scale and weigh at same time... If you gain 5-10 lbs in 48 hours with associated lower extremity swelling, please contact our office at [phone]  4. Fever of 101.5 for at least 24 hours with no source, please contact our office at [phone]  5. Activity- up as tolerated, please walk at least 3 times per day.  Avoid strenuous activity, no lifting, pushing, or pulling with your arms over 8-10 lbs for a minimum of 6 weeks  6. If any questions or concerns arise, please do not hesitate to contact our office at [place] with MEALS and AT BEDTIME.... Record levels for PCP     Diabetes Mellitus and Nutrition When you have diabetes (diabetes mellitus), it is very important to have healthy eating habits because your blood sugar (glucose) levels are greatly affected by what you eat and drink. Eating healthy foods in the appropriate amounts, at about the same times every day, can help you:  Control your blood glucose.  Lower your risk of heart disease.  Improve your blood pressure.  Reach or maintain a healthy weight.  Every person with diabetes is different, and each person has different needs for a meal plan. Your health care provider may recommend that you work with a diet and nutrition specialist (dietitian) to make a meal plan that is best for you. Your meal plan may vary depending on factors such as:  The calories you  need.  The medicines you take.  Your weight.  Your blood glucose, blood pressure, and cholesterol levels.  Your activity level.  Other health conditions you have, such as heart or kidney disease.  How do carbohydrates affect me? Carbohydrates affect your blood glucose level more than any other type of food. Eating carbohydrates naturally increases the amount of glucose in your blood. Carbohydrate counting is a method for keeping track of how many carbohydrates you eat. Counting carbohydrates is important to keep your blood glucose at a healthy level, especially if you use insulin or take certain oral diabetes medicines. It is important to know how many carbohydrates you can safely have in each meal. This is different for every person. Your dietitian can help you calculate how many carbohydrates you should have at each meal and for snack. Foods that contain carbohydrates include:  Bread, cereal, rice, pasta, and crackers.  Potatoes and corn.  Peas, beans, and lentils.  Milk and yogurt.  Fruit and juice.  Desserts, such as cakes, cookies, ice cream, and candy.  How does alcohol affect me? Alcohol can cause a sudden decrease in blood glucose (hypoglycemia), especially if you use insulin or take certain oral diabetes medicines. Hypoglycemia can be a life-threatening condition. Symptoms of hypoglycemia (sleepiness, dizziness, and confusion) are similar to symptoms of having too much alcohol. If your health care provider says that alcohol is safe for you, follow these guidelines:  Limit alcohol intake to no more than 1 drink  per day for nonpregnant women and 2 drinks per day for men. One drink equals 12 oz of beer, 5 oz of wine, or 1 oz of hard liquor.  Do not drink on an empty stomach.  Keep yourself hydrated with water, diet soda, or unsweetened iced tea.  Keep in mind that regular soda, juice, and other mixers may contain a lot of sugar and must be counted as  carbohydrates.  What are tips for following this plan? Reading food labels  Start by checking the serving size on the label. The amount of calories, carbohydrates, fats, and other nutrients listed on the label are based on one serving of the food. Many foods contain more than one serving per package.  Check the total grams (g) of carbohydrates in one serving. You can calculate the number of servings of carbohydrates in one serving by dividing the total carbohydrates by 15. For example, if a food has 30 g of total carbohydrates, it would be equal to 2 servings of carbohydrates.  Check the number of grams (g) of saturated and trans fats in one serving. Choose foods that have low or no amount of these fats.  Check the number of milligrams (mg) of sodium in one serving. Most people should limit total sodium intake to less than 2,300 mg per day.  Always check the nutrition information of foods labeled as "low-fat" or "nonfat". These foods may be higher in added sugar or refined carbohydrates and should be avoided.  Talk to your dietitian to identify your daily goals for nutrients listed on the label. Shopping  Avoid buying canned, premade, or processed foods. These foods tend to be high in fat, sodium, and added sugar.  Shop around the outside edge of the grocery store. This includes fresh fruits and vegetables, bulk grains, fresh meats, and fresh dairy. Cooking  Use low-heat cooking methods, such as baking, instead of high-heat cooking methods like deep frying.  Cook using healthy oils, such as olive, canola, or sunflower oil.  Avoid cooking with butter, cream, or high-fat meats. Meal planning  Eat meals and snacks regularly, preferably at the same times every day. Avoid going long periods of time without eating.  Eat foods high in fiber, such as fresh fruits, vegetables, beans, and whole grains. Talk to your dietitian about how many servings of carbohydrates you can eat at each  meal.  Eat 4-6 ounces of lean protein each day, such as lean meat, chicken, fish, eggs, or tofu. 1 ounce is equal to 1 ounce of meat, chicken, or fish, 1 egg, or 1/4 cup of tofu.  Eat some foods each day that contain healthy fats, such as avocado, nuts, seeds, and fish. Lifestyle   Check your blood glucose regularly.  Exercise at least 30 minutes 5 or more days each week, or as told by your health care provider.  Take medicines as told by your health care provider.  Do not use any products that contain nicotine or tobacco, such as cigarettes and e-cigarettes. If you need help quitting, ask your health care provider.  Work with a Social worker or diabetes educator to identify strategies to manage stress and any emotional and social challenges. What are some questions to ask my health care provider?  Do I need to meet with a diabetes educator?  Do I need to meet with a dietitian?  What number can I call if I have questions?  When are the best times to check my blood glucose? Where to find more information:  American Diabetes Association: diabetes.org/food-and-fitness/food  Academy of Nutrition and Dietetics: PokerClues.dk  [employer] of Diabetes and Digestive and Kidney Diseases (NIH): ContactWire.be Summary  A healthy meal plan will help you control your blood glucose and maintain a healthy lifestyle.  Working with a diet and nutrition specialist (dietitian) can help you make a meal plan that is best for you.  Keep in mind that carbohydrates and alcohol have immediate effects on your blood glucose levels. It is important to count carbohydrates and to use alcohol carefully. This information is not intended to replace advice given to you by your health care provider. Make sure you discuss any questions you have with your health care provider. Document  Released: 09/19/2004 Document Revised: 01/28/2016 Document Reviewed: 01/28/2016 Elsevier Interactive Patient Education  2018 [employer].   Carbohydrate Counting for Diabetes Mellitus, Adult Carbohydrate counting is a method for keeping track of how many carbohydrates you eat. Eating carbohydrates naturally increases the amount of sugar (glucose) in the blood. Counting how many carbohydrates you eat helps keep your blood glucose within normal limits, which helps you manage your diabetes (diabetes mellitus). It is important to know how many carbohydrates you can safely have in each meal. This is different for every person. A diet and nutrition specialist (registered dietitian) can help you make a meal plan and calculate how many carbohydrates you should have at each meal and snack. Carbohydrates are found in the following foods:  Grains, such as breads and cereals.  Dried beans and soy products.  Starchy vegetables, such as potatoes, peas, and corn.  Fruit and fruit juices.  Milk and yogurt.  Sweets and snack foods, such as cake, cookies, candy, chips, and soft drinks.  How do I count carbohydrates? There are two ways to count carbohydrates in food. You can use either of the methods or a combination of both. Reading "Nutrition Facts" on packaged food The "Nutrition Facts" list is included on the labels of almost all packaged foods and beverages in the [place] It includes:  The serving size.  Information about nutrients in each serving, including the grams (g) of carbohydrate per serving.  To use the Nutrition Facts":  Decide how many servings you will have.  Multiply the number of servings by the number of carbohydrates per serving.  The resulting number is the total amount of carbohydrates that you will be having.  Learning standard serving sizes of other foods When you eat foods containing carbohydrates that are not packaged or do not include "Nutrition Facts" on the label,  you need to measure the servings in order to count the amount of carbohydrates:  Measure the foods that you will eat with a food scale or measuring cup, if needed.  Decide how many standard-size servings you will eat.  Multiply the number of servings by 15. Most carbohydrate-rich foods have about 15 g of carbohydrates per serving. ? For example, if you eat 8 oz (170 g) of strawberries, you will have eaten 2 servings and 30 g of carbohydrates (2 servings x 15 g = 30 g).  For foods that have more than one food mixed, such as soups and casseroles, you must count the carbohydrates in each food that is included.  The following list contains standard serving sizes of common carbohydrate-rich foods. Each of these servings has about 15 g of carbohydrates:   hamburger bun or  English muffin.   oz (15 mL) syrup.   oz (14 g) jelly.  1 slice of bread.  1  six-inch tortilla.  3 oz (85 g) cooked rice or pasta.  4 oz (113 g) cooked dried beans.  4 oz (113 g) starchy vegetable, such as peas, corn, or potatoes.  4 oz (113 g) hot cereal.  4 oz (113 g) mashed potatoes or  of a large baked potato.  4 oz (113 g) canned or frozen fruit.  4 oz (120 mL) fruit juice.  4-6 crackers.  6 chicken nuggets.  6 oz (170 g) unsweetened dry cereal.  6 oz (170 g) plain fat-free yogurt or yogurt sweetened with artificial sweeteners.  8 oz (240 mL) milk.  8 oz (170 g) fresh fruit or one small piece of fruit.  24 oz (680 g) popped popcorn.  Example of carbohydrate counting Sample meal  3 oz (85 g) chicken breast.  6 oz (170 g) brown rice.  4 oz (113 g) corn.  8 oz (240 mL) milk.  8 oz (170 g) strawberries with sugar-free whipped topping. Carbohydrate calculation 1. Identify the foods that contain carbohydrates: ? Rice. ? Corn. ? Milk. ? Strawberries. 2. Calculate how many servings you have of each food: ? 2 servings rice. ? 1 serving corn. ? 1 serving milk. ? 1 serving  strawberries. 3. Multiply each number of servings by 15 g: ? 2 servings rice x 15 g = 30 g. ? 1 serving corn x 15 g = 15 g. ? 1 serving milk x 15 g = 15 g. ? 1 serving strawberries x 15 g = 15 g. 4. Add together all of the amounts to find the total grams of carbohydrates eaten: ? 30 g + 15 g + 15 g + 15 g = 75 g of carbohydrates total. This information is not intended to replace advice given to you by your health care provider. Make sure you discuss any questions you have with your health care provider. Document Released: 12/23/2004 Document Revised: 07/13/2015 Document Reviewed: 06/06/2015 Elsevier Interactive Patient Education  2018 [place] on my medicine - XARELTO (Rivaroxaban)  This medication education was reviewed with me or my healthcare representative as part of my discharge preparation.  The pharmacist that spoke with me during my hospital stay was:  Saundra Shelling, [facility]  Why was Xarelto prescribed for you? Xarelto was prescribed for you to reduce the risk of a blood clot forming that can cause a stroke if you have a medical condition called atrial fibrillation (a type of irregular heartbeat).  What do you need to know about xarelto ? Take your Xarelto ONCE DAILY at the same time every day with your evening meal. If you have difficulty swallowing the tablet whole, you may crush it and mix in applesauce just prior to taking your dose.  Take Xarelto exactly as prescribed by your doctor and DO NOT stop taking Xarelto without talking to the doctor who prescribed the medication.  Stopping without other stroke prevention medication to take the place of Xarelto may increase your risk of developing a clot that causes a stroke.  Refill your prescription before you run out.  After discharge, you should have regular check-up appointments with your healthcare provider that is prescribing your Xarelto.  In the future your dose may need to be changed if your  kidney function or weight changes by a significant amount.  What do you do if you miss a dose? If you are taking Xarelto ONCE DAILY and you miss a dose, take it as soon as you remember on  the same day then continue your regularly scheduled once daily regimen the next day. Do not take two doses of Xarelto at the same time or on the same day.   Important Safety Information A possible side effect of Xarelto is bleeding. You should call your healthcare provider right away if you experience any of the following: ? Bleeding from an injury or your nose that does not stop. ? Unusual colored urine (red or dark brown) or unusual colored stools (red or black). ? Unusual bruising for unknown reasons. ? A serious fall or if you hit your head (even if there is no bleeding).  Some medicines may interact with Xarelto and might increase your risk of bleeding while on Xarelto. To help avoid this, consult your healthcare provider or pharmacist prior to using any new prescription or non-prescription medications, including herbals, vitamins, non-steroidal anti-inflammatory drugs (NSAIDs) and supplements.  This website has more information on Xarelto: [email or web address]://guerra-benson.[email or web address]

## 2017-08-28 NOTE — Progress Notes (Signed)
Pt. Refused cpap. 

## 2017-08-28 NOTE — Care Management Note (Signed)
Case Management Note Marvetta Gibbons RN, BSN Unit 4E- RN Care Coordinator  [phone]  Patient Details  Name: [name] MRN: [id number] Date of Birth: 1960-05-17  Subjective/Objective:   Pt admitted with CAD, MVD s/p CABG x3 on 8/19                  Action/Plan: PTA pt lived at home with spouse, was on Tikosyn 213mcg PTA- is being reloaded post op with Tikosyn. CM will follow for transition of care needs  Expected Discharge Date:                  Expected Discharge Plan:  Home/Self Care  In-House Referral:     Discharge planning Services  CM Consult, Medication Assistance  Post Acute Care Choice:    Choice offered to:     DME Arranged:    DME Agency:     HH Arranged:    HH Agency:     Status of Service:  In process, will continue to follow  If discussed at Long Length of Stay Meetings, dates discussed:    Discharge Disposition:   Additional Comments:  Dawayne Patricia, RN 08/28/2017, 10:53 AM

## 2017-08-29 ENCOUNTER — Inpatient Hospital Stay (HOSPITAL_COMMUNITY): Payer: BLUE CROSS/BLUE SHIELD

## 2017-08-29 LAB — BASIC METABOLIC PANEL
Anion gap: 8 (ref 5–15)
BUN: 20 mg/dL (ref 6–20)
CO2: 26 mmol/L (ref 22–32)
Calcium: 8.4 mg/dL — ABNORMAL LOW (ref 8.9–10.3)
Chloride: 104 mmol/L (ref 98–111)
Creatinine, Ser: 0.98 mg/dL (ref 0.61–1.24)
GFR calc Af Amer: >60 mL/min (ref >60)
GFR calc non Af Amer: >60 mL/min (ref >60)
Glucose, Bld: 121 mg/dL — ABNORMAL HIGH (ref 70–99)
Potassium: 3.6 mmol/L (ref 3.5–5.1)
Sodium: 138 mmol/L (ref 135–145)

## 2017-08-29 LAB — GLUCOSE, CAPILLARY
Glucose-Capillary: 108 mg/dL — ABNORMAL HIGH (ref 70–99)
Glucose-Capillary: 91 mg/dL (ref 70–99)
Glucose-Capillary: 96 mg/dL (ref 70–99)
Glucose-Capillary: 120 mg/dL — ABNORMAL HIGH (ref 70–99)

## 2017-08-29 LAB — CBC
HCT: 32.1 % — ABNORMAL LOW (ref 39.0–52.0)
Hemoglobin: 9.9 g/dL — ABNORMAL LOW (ref 13.0–17.0)
MCH: 29 pg (ref 26.0–34.0)
MCHC: 30.8 g/dL (ref 30.0–36.0)
MCV: 94.1 fL (ref 78.0–100.0)
Platelets: 201 10*3/uL (ref 150–400)
RBC: 3.41 MIL/uL — ABNORMAL LOW (ref 4.22–5.81)
RDW: 15.5 % (ref 11.5–15.5)
WBC: 10.9 10*3/uL — ABNORMAL HIGH (ref 4.0–10.5)

## 2017-08-29 LAB — LIPID PANEL
CHOL/HDL RATIO: 3.8 ratio
Cholesterol: 88 mg/dL (ref 0–200)
HDL: 23 mg/dL — ABNORMAL LOW (ref >40)
LDL Cholesterol: 41 mg/dL (ref 0–99)
TRIGLYCERIDES: 118 mg/dL (ref <150)
VLDL: 24 mg/dL (ref 0–40)

## 2017-08-29 LAB — MAGNESIUM: Magnesium: 1.9 mg/dL (ref 1.7–2.4)

## 2017-08-29 MED ORDER — LEVALBUTEROL HCL 0.63 MG/3ML IN NEBU
0.6300 mg | INHALATION_SOLUTION | Freq: Three times a day (TID) | RESPIRATORY_TRACT | Status: DC
  Administered 2017-08-29 – 2017-08-30 (×5): 0.63 mg via RESPIRATORY_TRACT
  Filled 2017-08-29 (×5): qty 3

## 2017-08-29 MED ORDER — DILTIAZEM HCL-DEXTROSE 100-5 MG/100ML-% IV SOLN (PREMIX)
5.0000 mg/h | INTRAVENOUS | Status: DC
  Administered 2017-08-29 – 2017-08-30 (×6): 15 mg/h via INTRAVENOUS
  Administered 2017-08-31: 17.5 mg/h via INTRAVENOUS
  Administered 2017-08-31: 20 mg/h via INTRAVENOUS
  Administered 2017-08-31 (×2): 15 mg/h via INTRAVENOUS
  Administered 2017-08-31: 20 mg/h via INTRAVENOUS
  Administered 2017-09-01 – 2017-09-02 (×5): 15 mg/h via INTRAVENOUS
  Filled 2017-08-29 (×16): qty 100

## 2017-08-29 MED ORDER — POTASSIUM CHLORIDE CRYS ER 20 MEQ PO TBCR
40.0000 meq | EXTENDED_RELEASE_TABLET | Freq: Once | ORAL | Status: AC
  Administered 2017-08-29: 40 meq via ORAL
  Filled 2017-08-29: qty 2

## 2017-08-29 MED ORDER — METOPROLOL TARTRATE 50 MG PO TABS: 50.0000 mg | ORAL_TABLET | Freq: Two times a day (BID) | ORAL | Status: DC

## 2017-08-29 MED ORDER — METOPROLOL TARTRATE 25 MG PO TABS
25.0000 mg | ORAL_TABLET | Freq: Two times a day (BID) | ORAL | Status: DC
  Administered 2017-08-29 – 2017-09-01 (×8): 25 mg via ORAL
  Filled 2017-08-29 (×8): qty 1

## 2017-08-29 NOTE — Progress Notes (Signed)
CARDIAC REHAB PHASE I   PRE:  Rate/Rhythm: 73  BP:  Sitting: 125/87     SaO2: 94ra  MODE:  Ambulation: 800 ft   POST:  Rate/Rhythm: 135  BP:  Sitting: 126/77     SaO2: 98ra  10:00a-10:30a  Patient ambulated independently using a rolling walker. 2 rest breaks needed due to sob. Encouraged IS use. Patient stated that with every walk he feels likes he is getting better and better. Encouraged 2 more walks today. Call bell in reach. Back in chair.   [place], MS 08/29/2017 10:25 AM

## 2017-08-29 NOTE — Progress Notes (Signed)
Progress Note  Patient Name: Paul Todd Date of Encounter: 08/29/2017  Primary Cardiologist: Pixie Casino, MD   Subjective   No complaints  Inpatient Medications    Scheduled Meds: . acetaminophen  1,000 mg Oral Q6H  . aspirin EC  81 mg Oral Daily  . atorvastatin  80 mg Oral q1800  . bisacodyl  10 mg Oral Daily  . Chlorhexidine Gluconate Cloth  6 each Topical Daily  . docusate sodium  200 mg Oral Daily  . dofetilide  250 mcg Oral BID  . DULoxetine  60 mg Oral QPM  . furosemide  40 mg Oral Daily  . insulin aspart  0-24 Units Subcutaneous TID WC  . insulin aspart  4 Units Subcutaneous TID WC  . insulin detemir  15 Units Subcutaneous BID  . losartan  25 mg Oral Daily  . metoprolol tartrate  25 mg Oral BID  . mometasone-formoterol  2 puff Inhalation BID  . pantoprazole  40 mg Oral Daily  . potassium chloride  20 mEq Oral BID  . rivaroxaban  20 mg Oral Q supper  . sodium chloride flush  10-40 mL Intracatheter Q12H  . sodium chloride flush  3 mL Intravenous Q12H   Continuous Infusions: . sodium chloride Stopped (08/25/17 0512)  . diltiazem (CARDIZEM) infusion 15 mg/hr (08/29/17 0539)  . lactated ringers     PRN Meds: metoprolol tartrate, ondansetron (ZOFRAN) IV, oxyCODONE, sodium chloride flush, sodium chloride flush, traMADol   Vital Signs    Vitals:   08/28/17 2327 08/29/17 0350 08/29/17 0812 08/29/17 0849  BP: 100/76 119/61  105/71  Pulse: (!) 133 (!) 129  99  Resp: (!) 24 20  20   Temp: 97.6 F (36.4 C) 98.2 F (36.8 C)  (!) 96.6 F (35.9 C)  TempSrc: Oral Oral  Axillary  SpO2: 96% 95% 94% 98%  Weight:  117.8 kg    Height:        Intake/Output Summary (Last 24 hours) at 08/29/2017 0953 Last data filed at 08/29/2017 0400 Gross per 24 hour  Intake 283.93 ml  Output 175 ml  Net 108.93 ml   Filed Weights   08/27/17 0500 08/28/17 0356 08/29/17 0350  Weight: 119.6 kg 117.5 kg 117.8 kg    Telemetry    Aflutter elevated rates - Personally  Reviewed  ECG     Physical Exam   GEN: No acute distress.   Neck: No JVD Cardiac: irreg, no murmurs, rubs, or gallops.  Respiratory: Clear to auscultation bilaterally. GI: Soft, nontender, non-distended  MS: No edema; No deformity. Neuro:  Nonfocal  Psych: Normal affect   Labs    Chemistry Recent Labs  Lab 08/27/17 0352 08/28/17 0330 08/29/17 0413  NA 140 141 138  K 4.1 3.9 3.6  CL 106 106 104  CO2 27 28 26   GLUCOSE 116* 126* 121*  BUN 14 21* 20  CREATININE 0.91 0.91 0.98  CALCIUM 8.0* 8.5* 8.4*  GFRNONAA >60 >60 >60  GFRAA >60 >60 >60  ANIONGAP 7 7 8      Hematology Recent Labs  Lab 08/27/17 0247 08/28/17 0330 08/29/17 0413  WBC 14.3* 12.8* 10.9*  RBC 3.59* 3.65* 3.41*  HGB 10.4* 10.7* 9.9*  HCT 33.9* 34.4* 32.1*  MCV 94.4 94.2 94.1  MCH 29.0 29.3 29.0  MCHC 30.7 31.1 30.8  RDW 15.4 15.6* 15.5  PLT 117* 173 201    Cardiac EnzymesNo results for input(s): TROPONINI in the last 168 hours. No results for input(s): TROPIPOC in  the last 168 hours.   BNPNo results for input(s): BNP, PROBNP in the last 168 hours.   DDimer No results for input(s): DDIMER in the last 168 hours.   Radiology    Dg Chest 2 View  Result Date: 08/29/2017 CLINICAL DATA:  Follow-up CABG EXAM: CHEST - 2 VIEW COMPARISON:  08/27/2017 and previous FINDINGS: Previous median sternotomy and CABG. Right subclavian central line tip at the subclavian SVC junction. No pneumothorax. Small left effusion. Mild right base atelectasis. Mild to moderate left lung patchy atelectasis, somewhat worsened since 2 days ago with a new patchy density in the left upper lobe. IMPRESSION: No pneumothorax. Small left effusion. Patchy atelectasis the lungs left more than right, with some worsening on the left since 2 days ago. Electronically Signed   By: Nelson Chimes M.D.   On: 08/29/2017 08:45    Cardiac Studies    Patient Profile     57 year old man with known coronary disease and peripheral vascular  disease, atrial fibrillation on Tikosyn and Xarelto and DM, who presented with an episode of chest discomfort concerning for unstable angina. Found to have severe disease on cath, recommended for CABG. Now s/p CABG 08/24/17. Post op recovery c/b atrial flutter w/ RVR.   Assessment & Plan    1. CAD - s/p CABG x 3 (LIMA-LAD, SVG-OM, SVG- RI). EF normal at 55-60%. - medical therapy with ASA 81, atorva 80, losartan 25, lopressor 25mg  bid. LDL 41 this AM  2. Afib/aflutter - history of PAF, has been on [place] and xarelto - was in NSR prior to CABG. Postop went into aflutter with RVR. Being reloaded with [place].  - from chart review tikasy held 8/20, 21, 22. Restarted at prior dose 8/23 250mg  bid, has received 3 total doses. QTc stable yesterday - currently on tiaksyn 250mg  bid, metoprolol 25mg  bid, dilt gtt.  - xarelto restarted just yesterday, was on heparin but few days where he did not received anticoag. Would require TEE/DCCV if needed. If rhythm does not break over weekend would plan for TEE/DCCV Monday  - keep K at 4 and Mg at 2 on tikasyn. Give additional 72mEq of KCl along with his regular supplement. Mg of 1.9 is acceptable   For questions or updates, please contact [place] Please consult [email or web address] for contact info under Cardiology/STEMI.      Merrily Pew, MD  08/29/2017, 9:53 AM

## 2017-08-29 NOTE — Progress Notes (Addendum)
      [address]       [place] [address]             [phone]      5 Days Post-Op Procedure(s) (LRB): CORONARY ARTERY BYPASS GRAFTING (CABG) X3. RIGHT ENDOSCOPIC SAPHENOUS VEIN HARVEST. MAMM TO LAD, SVG TO OM, SVG TO RAMUS (N/A) TRANSESOPHAGEAL ECHOCARDIOGRAM (TEE) (N/A)   Subjective:  Feels better today.  Anxious to go home.  States his A. Fib isn't bothering him as much.  Objective: Vital signs in last 24 hours: Temp:  [96.6 F (35.9 C)-98.2 F (36.8 C)] 96.6 F (35.9 C) (08/24 0849) Pulse Rate:  [99-150] 99 (08/24 0849) Cardiac Rhythm: Atrial flutter (08/23 2000) Resp:  [20-24] 20 (08/24 0849) BP: (100-122)/(61-85) 105/71 (08/24 0849) SpO2:  [94 %-98 %] 98 % (08/24 0849) Weight:  [117.8 kg] 117.8 kg (08/24 0350)  Intake/Output from previous day: 08/23 0701 - 08/24 0700 In: 286.9 [I.V.:286.9] Out: 175 [Urine:175]  General appearance: alert, cooperative and no distress Heart: regular rate and rhythm Lungs: diminished breath sounds LLL Abdomen: soft, non-tender; bowel sounds normal; no masses,  no organomegaly Extremities: edema trace Wound: clean and dry  Lab Results: Recent Labs    08/28/17 0330 08/29/17 0413  WBC 12.8* 10.9*  HGB 10.7* 9.9*  HCT 34.4* 32.1*  PLT 173 201   BMET:  Recent Labs    08/28/17 0330 08/29/17 0413  NA 141 138  K 3.9 3.6  CL 106 104  CO2 28 26  GLUCOSE 126* 121*  BUN 21* 20  CREATININE 0.91 0.98  CALCIUM 8.5* 8.4*    PT/INR: No results for input(s): LABPROT, INR in the last 72 hours. ABG    Component Value Date/Time   PHART 7.421 08/26/2017 0506   HCO3 25.8 08/26/2017 0506   TCO2 27 08/26/2017 0506   ACIDBASEDEF 2.0 08/25/2017 1821   O2SAT 99.0 08/26/2017 0506   CBG (last 3)  Recent Labs    08/28/17 1644 08/28/17 2101 08/29/17 0623  GLUCAP 96 116* 108*    Assessment/Plan: S/P Procedure(s) (LRB): CORONARY ARTERY BYPASS GRAFTING (CABG) X3. RIGHT ENDOSCOPIC SAPHENOUS VEIN HARVEST. MAMM  TO LAD, SVG TO OM, SVG TO RAMUS (N/A) TRANSESOPHAGEAL ECHOCARDIOGRAM (TEE) (N/A)  1. CV- Atrial Flutter, now rate controlled- Tikosyn is being loaded, remains on IV Cardizem drip.. May be able to stop and titrate BB dose to 50 mg BID, home Xarelto has been resumed 2. Pulm- CXR with small effusion, atelectasis on left, off oxygen, ordered Xopenex nebs  3. Renal- creatinine remains stable, weight is trending down, continue Lasix, potassium 4. DM- sugars controlled 5. Dispo- no in rate controlled A. Flutter, Tikosyn is being loaded, remains on IV Cardizem drip, may be able to stop and increase BB dose.. Will defer to Cardiology, continue Xarelto, may be ready for d/c in the next 24-48 hours pending rhythm   LOS: 7 days    [name] 08/29/2017 Patient seen and examined In atrial flutter, rate back up into 130s- will keep Diltiazem drip and leave lopressor at 25 BID  Remo Lipps C. Roxan Hockey, MD [facility] [phone]

## 2017-08-29 NOTE — Progress Notes (Signed)
Patient ambulated 450 ft in the hallway with front wheel walker. Had to stop once for a break. Saturation 97 percent after walk. Patient resting in chair. Will continue to monitor. Lajoyce Corners, RN

## 2017-08-29 NOTE — Progress Notes (Signed)
Patient refused CPAP for the night. A new mask and head gear was offered to patient. Will continue to monitor patient.

## 2017-08-30 LAB — GLUCOSE, CAPILLARY
Glucose-Capillary: 123 mg/dL — ABNORMAL HIGH (ref 70–99)
Glucose-Capillary: 83 mg/dL (ref 70–99)
Glucose-Capillary: 89 mg/dL (ref 70–99)
Glucose-Capillary: 145 mg/dL — ABNORMAL HIGH (ref 70–99)

## 2017-08-30 LAB — MAGNESIUM: Magnesium: 2 mg/dL (ref 1.7–2.4)
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
## 2017-08-30 NOTE — Progress Notes (Addendum)
Progress Note  Patient Name: Paul Todd Date of Encounter: 08/30/2017  Primary Cardiologist: Pixie Casino, MD   Subjective   Some soreness at surgical site.   Inpatient Medications    Scheduled Meds: . aspirin EC  81 mg Oral Daily  . atorvastatin  80 mg Oral q1800  . bisacodyl  10 mg Oral Daily  . Chlorhexidine Gluconate Cloth  6 each Topical Daily  . docusate sodium  200 mg Oral Daily  . dofetilide  250 mcg Oral BID  . DULoxetine  60 mg Oral QPM  . furosemide  40 mg Oral Daily  . insulin aspart  0-24 Units Subcutaneous TID WC  . insulin aspart  4 Units Subcutaneous TID WC  . insulin detemir  15 Units Subcutaneous BID  . levalbuterol  0.63 mg Nebulization TID  . losartan  25 mg Oral Daily  . metoprolol tartrate  25 mg Oral BID  . mometasone-formoterol  2 puff Inhalation BID  . pantoprazole  40 mg Oral Daily  . potassium chloride  20 mEq Oral BID  . rivaroxaban  20 mg Oral Q supper  . sodium chloride flush  10-40 mL Intracatheter Q12H  . sodium chloride flush  3 mL Intravenous Q12H   Continuous Infusions: . sodium chloride Stopped (08/25/17 0512)  . diltiazem (CARDIZEM) infusion 15 mg/hr (08/30/17 0718)  . lactated ringers     PRN Meds: metoprolol tartrate, ondansetron (ZOFRAN) IV, oxyCODONE, sodium chloride flush, sodium chloride flush, traMADol   Vital Signs    Vitals:   08/29/17 2200 08/29/17 2354 08/30/17 0339 08/30/17 0800  BP: 125/70 126/61 123/71 117/70  Pulse: 71 87 64 70  Resp:  (!) 21 (!) 21 18  Temp:  98.5 F (36.9 C) 97.6 F (36.4 C) 98 F (36.7 C)  TempSrc:  Oral Oral Oral  SpO2:  98% 97% 96%  Weight:   118.7 kg   Height:        Intake/Output Summary (Last 24 hours) at 08/30/2017 0818 Last data filed at 08/30/2017 0344 Gross per 24 hour  Intake 459.71 ml  Output 300 ml  Net 159.71 ml   Filed Weights   08/28/17 0356 08/29/17 0350 08/30/17 0339  Weight: 117.5 kg 117.8 kg 118.7 kg    Telemetry    Aflutter with elevated rates  - Personally Reviewed  ECG    na  Physical Exam   GEN: No acute distress.   Neck: No JVD Cardiac: irreg, no murmurs, rubs, or gallops.  Respiratory: Clear to auscultation bilaterally. GI: Soft, nontender, non-distended  MS: trace bilateral edema; No deformity. Neuro:  Nonfocal  Psych: Normal affect   Labs    Chemistry Recent Labs  Lab 08/27/17 0352 08/28/17 0330 08/29/17 0413  NA 140 141 138  K 4.1 3.9 3.6  CL 106 106 104  CO2 27 28 26   GLUCOSE 116* 126* 121*  BUN 14 21* 20  CREATININE 0.91 0.91 0.98  CALCIUM 8.0* 8.5* 8.4*  GFRNONAA >60 >60 >60  GFRAA >60 >60 >60  ANIONGAP 7 7 8      Hematology Recent Labs  Lab 08/27/17 0247 08/28/17 0330 08/29/17 0413  WBC 14.3* 12.8* 10.9*  RBC 3.59* 3.65* 3.41*  HGB 10.4* 10.7* 9.9*  HCT 33.9* 34.4* 32.1*  MCV 94.4 94.2 94.1  MCH 29.0 29.3 29.0  MCHC 30.7 31.1 30.8  RDW 15.4 15.6* 15.5  PLT 117* 173 201    Cardiac EnzymesNo results for input(s): TROPONINI in the last 168 hours. No  results for input(s): TROPIPOC in the last 168 hours.   BNPNo results for input(s): BNP, PROBNP in the last 168 hours.   DDimer No results for input(s): DDIMER in the last 168 hours.   Radiology    Dg Chest 2 View  Result Date: 08/29/2017 CLINICAL DATA:  Follow-up CABG EXAM: CHEST - 2 VIEW COMPARISON:  08/27/2017 and previous FINDINGS: Previous median sternotomy and CABG. Right subclavian central line tip at the subclavian SVC junction. No pneumothorax. Small left effusion. Mild right base atelectasis. Mild to moderate left lung patchy atelectasis, somewhat worsened since 2 days ago with a new patchy density in the left upper lobe. IMPRESSION: No pneumothorax. Small left effusion. Patchy atelectasis the lungs left more than right, with some worsening on the left since 2 days ago. Electronically Signed   By: Nelson Chimes M.D.   On: 08/29/2017 08:45    Cardiac Studies     Patient Profile     57 year old man with known coronary  disease and peripheral vascular disease,atrial fibrillation on Tikosyn and Xarelto and DM,who presentedwith an episode of chest discomfort concerning for unstable angina. Found to have severe disease on cath, recommended for CABG. Now s/p CABG 08/24/17. Post op recovery c/b atrial flutter w/ RVR.  Assessment & Plan    1. CAD - s/p CABG x 3 (LIMA-LAD, SVG-OM, SVG- RI). EF normal at 55-60%. - medical therapy with ASA 81, atorva 80, losartan 25, lopressor 25mg  bid. LDL 41 this AM  2. Afib/aflutter - history of PAF, has been on [place] and xarelto - was in NSR prior to CABG. Postop went into aflutter with RVR. Being reloaded with Netta Corrigan, - from chart review tikasy held 8/20, 21, 22 but was on amio for surgery. Restarted tikasyn at prior dose 8/23 250mg  bid, has received 4 total doses. QTc has been stable - currently on tiaksyn 250mg  bid, metoprolol 25mg  bid, dilt gtt.  - xarelto restarted just yesterday, was on heparin but few days where he did not received anticoag. Would require TEE/DCCV if needed. If rhythm does not break over weekend would plan for TEE/DCCV Monday - keep K at 4 and Mg at 2 on tikasyn.  - heparin drip stopped 5AM on 8/19, the day of surgery. Xarelto restarted on 8/23. Went into aflutter morning of 08/27/17. He will require a TEE /DCCV.  - discussed case with Dr Lovena Le EP, he recommends contnuing home dose of tikasyn and planning for cardioversion. Patient wishes to think over at this time, will check back later this afternoon if he is willing to proceed.      Addendum [id number] Discussed with patient and his wife the indications for TEE/DCCV. He is in agreement, we will work to arrange for tomorrow. Make npo at midnight.    For questions or updates, please contact [place] Please consult [email or web address] for contact info under Cardiology/STEMI.      Merrily Pew, MD  08/30/2017, 8:18 AM

## 2017-08-31 ENCOUNTER — Encounter (HOSPITAL_COMMUNITY)
Admission: EM | Disposition: A | Discharge status: Home / Self Care | Payer: Self-pay | Source: Home / Self Care | Attending: Cardiothoracic Surgery

## 2017-08-31 ENCOUNTER — Encounter (HOSPITAL_COMMUNITY): Payer: Self-pay | Admitting: Certified Registered Nurse Anesthetist

## 2017-08-31 ENCOUNTER — Inpatient Hospital Stay (HOSPITAL_COMMUNITY): Payer: BLUE CROSS/BLUE SHIELD

## 2017-08-31 ENCOUNTER — Inpatient Hospital Stay (HOSPITAL_COMMUNITY): Payer: BLUE CROSS/BLUE SHIELD | Admitting: Certified Registered Nurse Anesthetist

## 2017-08-31 DIAGNOSIS — I4891 Unspecified atrial fibrillation: Secondary | ICD-10-CM

## 2017-08-31 HISTORY — PX: TEE WITHOUT CARDIOVERSION: SHX5443

## 2017-08-31 HISTORY — PX: CARDIOVERSION: SHX1299

## 2017-08-31 LAB — BASIC METABOLIC PANEL
Anion gap: 6 (ref 5–15)
BUN: 14 mg/dL (ref 6–20)
CO2: 26 mmol/L (ref 22–32)
Calcium: 8.6 mg/dL — ABNORMAL LOW (ref 8.9–10.3)
Chloride: 104 mmol/L (ref 98–111)
Creatinine, Ser: 0.92 mg/dL (ref 0.61–1.24)
GFR calc Af Amer: >60 mL/min (ref >60)
GFR calc non Af Amer: >60 mL/min (ref >60)
Glucose, Bld: 124 mg/dL — ABNORMAL HIGH (ref 70–99)
Potassium: 4.2 mmol/L (ref 3.5–5.1)
Sodium: 136 mmol/L (ref 135–145)

## 2017-08-31 LAB — GLUCOSE, CAPILLARY
Glucose-Capillary: 133 mg/dL — ABNORMAL HIGH (ref 70–99)
Glucose-Capillary: 118 mg/dL — ABNORMAL HIGH (ref 70–99)
Glucose-Capillary: 158 mg/dL — ABNORMAL HIGH (ref 70–99)
Glucose-Capillary: 107 mg/dL — ABNORMAL HIGH (ref 70–99)

## 2017-08-31 LAB — MAGNESIUM: Magnesium: 2 mg/dL (ref 1.7–2.4)

## 2017-08-31 SURGERY — ECHOCARDIOGRAM, TRANSESOPHAGEAL
Anesthesia: General

## 2017-08-31 MED ORDER — PROPOFOL 500 MG/50ML IV EMUL
INTRAVENOUS | Status: DC | PRN
  Administered 2017-08-31: 100 ug/kg/min via INTRAVENOUS

## 2017-08-31 MED ORDER — DEXTROSE-NACL 5-0.45 % IV SOLN
INTRAVENOUS | Status: DC
  Administered 2017-08-31: 08:00:00 via INTRAVENOUS

## 2017-08-31 MED ORDER — FUROSEMIDE 10 MG/ML IJ SOLN
40.0000 mg | Freq: Once | INTRAMUSCULAR | Status: AC
  Administered 2017-08-31: 40 mg via INTRAVENOUS
  Filled 2017-08-31: qty 4

## 2017-08-31 MED ORDER — SODIUM CHLORIDE 0.9 % IV SOLN
INTRAVENOUS | Status: DC
  Administered 2017-08-31: 10:00:00 via INTRAVENOUS

## 2017-08-31 MED ORDER — PHENYLEPHRINE 40 MCG/ML (10ML) SYRINGE FOR IV PUSH (FOR BLOOD PRESSURE SUPPORT)
PREFILLED_SYRINGE | INTRAVENOUS | Status: DC | PRN
  Administered 2017-08-31: 80 ug via INTRAVENOUS

## 2017-08-31 MED ORDER — FUROSEMIDE 40 MG PO TABS
40.0000 mg | ORAL_TABLET | Freq: Every day | ORAL | Status: DC
  Administered 2017-09-01: 40 mg via ORAL
  Filled 2017-08-31: qty 1

## 2017-08-31 MED ORDER — PROPOFOL 10 MG/ML IV BOLUS
INTRAVENOUS | Status: DC | PRN
  Administered 2017-08-31: 30 mg via INTRAVENOUS

## 2017-08-31 NOTE — Progress Notes (Signed)
At approx 1157, patient converted back into a supraventricular tachycardic rhythm. He states he is aware of the change in the way he feels and though not overt dyspnea, was feeling much better after the cardioversion when in NSR than now. Wife at bedside. HR 90s-140.

## 2017-08-31 NOTE — Progress Notes (Signed)
  Echocardiogram Echocardiogram Transesophageal has been performed.  Bobbye Charleston 08/31/2017, 10:55 AM

## 2017-08-31 NOTE — Progress Notes (Signed)
BP 120/75 HR 120s-140. IV Diltiazem increased to 20mg /hr.

## 2017-08-31 NOTE — CV Procedure (Signed)
     Transesophageal Echocardiogram Note  Paul Todd [id number] June 25, 1960  Procedure: Transesophageal Echocardiogram Indications: atrial fibrillation  Procedure Details Consent: Obtained Time Out: Verified patient identification, verified procedure, site/side was marked, verified correct patient position, special equipment/implants available, Radiology Safety Procedures followed,  medications/allergies/relevent history reviewed, required imaging and test results available.  Performed  Medications: IV propofol administered by anesthesia staff  No intracardiac source of embolism was identified.  Complications: No apparent complications Patient did tolerate procedure well.  [name], MD, [facility] 08/31/2017, 11:03 AM    Cardioversion Note  Paul Todd [id number] 02-03-1960  Procedure: DC Cardioversion Indications: atrial fibrillation  Procedure Details Consent: Obtained Time Out: Verified patient identification, verified procedure, site/side was marked, verified correct patient position, special equipment/implants available, Radiology Safety Procedures followed,  medications/allergies/relevent history reviewed, required imaging and test results available.  Performed  The patient has been on adequate anticoagulation.  The patient received IV propofol for sedation.  Synchronous cardioversion was performed at 120 joules.  The cardioversion was successful  Complications: No apparent complications Patient did tolerate procedure well.  [name], MD, [facility] 08/31/2017, 11:03 AM

## 2017-08-31 NOTE — Progress Notes (Signed)
CARDIAC REHAB PHASE I   PRE:  Rate/Rhythm: 87 aflutter    BP: sitting 111/77    SaO2: 97 RA  MODE:  Ambulation: 520 ft   POST:  Rate/Rhythm: 130 aflutter    BP: sitting 110/90     SaO2: 95 RA   Pt walking independently, steady, no c/o. Uses RW for security but probably does not need. HR immediately up to 130 standing and maintained 130 during walk. No c/o. Return to recliner after BR (cut walk short due to need to urinate). Will f/u tomorrow. [place], ACSM 08/31/2017 8:56 AM

## 2017-08-31 NOTE — Interval H&P Note (Signed)
History and Physical Interval Note:  08/31/2017 9:34 AM  Paul Todd  has presented today for surgery, with the diagnosis of afib  The various methods of treatment have been discussed with the patient and family. After consideration of risks, benefits and other options for treatment, the patient has consented to  Procedure(s): TRANSESOPHAGEAL ECHOCARDIOGRAM (TEE) (N/A) CARDIOVERSION (N/A) as a surgical intervention .  The patient's history has been reviewed, patient examined, no change in status, stable for surgery.  I have reviewed the patient's chart and labs.  Questions were answered to the patient's satisfaction.     [name]

## 2017-08-31 NOTE — Progress Notes (Addendum)
      [address]       [place] [address]             [phone]        7 Days Post-Op Procedure(s) (LRB): CORONARY ARTERY BYPASS GRAFTING (CABG) X3. RIGHT ENDOSCOPIC SAPHENOUS VEIN HARVEST. MAMM TO LAD, SVG TO OM, SVG TO RAMUS (N/A) TRANSESOPHAGEAL ECHOCARDIOGRAM (TEE) (N/A)  Subjective: Patient states he is agreeable to DCCV. Only complaint is LE edema.  Objective: Vital signs in last 24 hours: Temp:  [97.5 F (36.4 C)-98.5 F (36.9 C)] 98 F (36.7 C) (08/26 0523) Pulse Rate:  [70-127] 102 (08/26 0523) Cardiac Rhythm: Atrial flutter (08/25 2029) Resp:  [18-28] 21 (08/26 0523) BP: (108-129)/(56-81) 129/75 (08/26 0523) SpO2:  [93 %-97 %] 96 % (08/26 0523) FiO2 (%):  [21 %] 21 % (08/25 1356) Weight:  [117.2 kg] 117.2 kg (08/26 0523)  Pre op weight 118.6 kg Current Weight  08/31/17 117.2 kg       Intake/Output from previous day: 08/25 0701 - 08/26 0700 In: 720 [P.O.:720] Out: 400 [Urine:400]   Physical Exam:  Cardiovascular: IRRR IRRR  Pulmonary: Clear to auscultation bilaterally Abdomen: Soft, non tender,obese, bowel sounds present. Extremities: ++ bilateral lower extremity edema. Wounds: Clean and dry.  No erythema or signs of infection.  Lab Results: CBC: Recent Labs    08/29/17 0413  WBC 10.9*  HGB 9.9*  HCT 32.1*  PLT 201   BMET:  Recent Labs    08/29/17 0413 08/31/17 0551  NA 138 136  K 3.6 4.2  CL 104 104  CO2 26 26  GLUCOSE 121* 124*  BUN 20 14  CREATININE 0.98 0.92  CALCIUM 8.4* 8.6*    PT/INR:  Lab Results  Component Value Date   INR 1.27 08/24/2017   INR 1.01 08/22/2017   INR 1.02 08/21/2017   ABG:  INR: Will add last result for INR, ABG once components are confirmed Will add last 4 CBG results once components are confirmed  Assessment/Plan:  1. CV - Remains in aflutter-HR in the 80-90's this am but has been up into the 140's previously. Cardiology recommending DCCV. On Cardizem drip, Dofetilide 250  mcg bid, Lopressor 25 mg bid, Losartan 25 mg daily, and Xarelto 20 mg daily. 2.  Pulmonary - On room air. On Dulera. Encourage incentive spirometer. 3. Volume Overload - On Lasix 40 mg daily. Will give IV this am. 4.  Acute blood loss anemia - Last H and H 9.9 and 32.1 5. DM-CBGs 89/145/133. On Insulin. Pre op HGA1C 7.4.  Donielle M ZimmermanPA-C 08/31/2017,7:28 AM [phone]  Place TED hose I talked to cardiology about TEE/ cardioversion today- keep npo  patient examined and medical record reviewed,agree with above note. [name] 08/31/2017
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
## 2017-08-31 NOTE — H&P (View-Only) (Signed)
      [address]       [place] [address]             [phone]        7 Days Post-Op Procedure(s) (LRB): CORONARY ARTERY BYPASS GRAFTING (CABG) X3. RIGHT ENDOSCOPIC SAPHENOUS VEIN HARVEST. MAMM TO LAD, SVG TO OM, SVG TO RAMUS (N/A) TRANSESOPHAGEAL ECHOCARDIOGRAM (TEE) (N/A)  Subjective: Patient states he is agreeable to DCCV. Only complaint is LE edema.  Objective: Vital signs in last 24 hours: Temp:  [97.5 F (36.4 C)-98.5 F (36.9 C)] 98 F (36.7 C) (08/26 0523) Pulse Rate:  [70-127] 102 (08/26 0523) Cardiac Rhythm: Atrial flutter (08/25 2029) Resp:  [18-28] 21 (08/26 0523) BP: (108-129)/(56-81) 129/75 (08/26 0523) SpO2:  [93 %-97 %] 96 % (08/26 0523) FiO2 (%):  [21 %] 21 % (08/25 1356) Weight:  [117.2 kg] 117.2 kg (08/26 0523)  Pre op weight 118.6 kg Current Weight  08/31/17 117.2 kg       Intake/Output from previous day: 08/25 0701 - 08/26 0700 In: 720 [P.O.:720] Out: 400 [Urine:400]   Physical Exam:  Cardiovascular: IRRR IRRR  Pulmonary: Clear to auscultation bilaterally Abdomen: Soft, non tender,obese, bowel sounds present. Extremities: ++ bilateral lower extremity edema. Wounds: Clean and dry.  No erythema or signs of infection.  Lab Results: CBC: Recent Labs    08/29/17 0413  WBC 10.9*  HGB 9.9*  HCT 32.1*  PLT 201   BMET:  Recent Labs    08/29/17 0413 08/31/17 0551  NA 138 136  K 3.6 4.2  CL 104 104  CO2 26 26  GLUCOSE 121* 124*  BUN 20 14  CREATININE 0.98 0.92  CALCIUM 8.4* 8.6*    PT/INR:  Lab Results  Component Value Date   INR 1.27 08/24/2017   INR 1.01 08/22/2017   INR 1.02 08/21/2017   ABG:  INR: Will add last result for INR, ABG once components are confirmed Will add last 4 CBG results once components are confirmed  Assessment/Plan:  1. CV - Remains in aflutter-HR in the 80-90's this am but has been up into the 140's previously. Cardiology recommending DCCV. On Cardizem drip, Dofetilide 250  mcg bid, Lopressor 25 mg bid, Losartan 25 mg daily, and Xarelto 20 mg daily. 2.  Pulmonary - On room air. On Dulera. Encourage incentive spirometer. 3. Volume Overload - On Lasix 40 mg daily. Will give IV this am. 4.  Acute blood loss anemia - Last H and H 9.9 and 32.1 5. DM-CBGs 89/145/133. On Insulin. Pre op HGA1C 7.4.  Donielle M ZimmermanPA-C 08/31/2017,7:28 AM [phone]  Place TED hose I talked to cardiology about TEE/ cardioversion today- keep npo  patient examined and medical record reviewed,agree with above note. [name] 08/31/2017
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
## 2017-08-31 NOTE — Transfer of Care (Signed)
Immediate Anesthesia Transfer of Care Note  Patient: Paul Todd  Procedure(s) Performed: TRANSESOPHAGEAL ECHOCARDIOGRAM (TEE) (N/A ) CARDIOVERSION (N/A )  Patient Location: Endoscopy Unit  Anesthesia Type:MAC  Level of Consciousness: awake, alert  and oriented  Airway & Oxygen Therapy: Patient Spontanous Breathing and Patient connected to nasal cannula oxygen  Post-op Assessment: Report given to RN and Post -op Vital signs reviewed and stable  Post vital signs: Reviewed and stable  Last Vitals:  Vitals Value Taken Time  BP    Temp    Pulse 70 08/31/2017 10:58 AM  Resp 17 08/31/2017 10:58 AM  SpO2 96 % 08/31/2017 10:58 AM  Vitals shown include unvalidated device data.  Last Pain:  Vitals:   08/31/17 0952  TempSrc: Oral  PainSc: 0-No pain      Patients Stated Pain Goal: 0 (62/26/33 3545)  Complications: No apparent anesthesia complications

## 2017-09-01 ENCOUNTER — Encounter (HOSPITAL_COMMUNITY): Payer: Self-pay | Admitting: Cardiology

## 2017-09-01 LAB — BASIC METABOLIC PANEL
Anion gap: 9 (ref 5–15)
BUN: 14 mg/dL (ref 6–20)
CO2: 26 mmol/L (ref 22–32)
Calcium: 8.7 mg/dL — ABNORMAL LOW (ref 8.9–10.3)
Chloride: 104 mmol/L (ref 98–111)
Creatinine, Ser: 0.86 mg/dL (ref 0.61–1.24)
GFR calc Af Amer: >60 mL/min (ref >60)
GFR calc non Af Amer: >60 mL/min (ref >60)
Glucose, Bld: 125 mg/dL — ABNORMAL HIGH (ref 70–99)
Potassium: 4.1 mmol/L (ref 3.5–5.1)
Sodium: 139 mmol/L (ref 135–145)

## 2017-09-01 LAB — GLUCOSE, CAPILLARY
Glucose-Capillary: 134 mg/dL — ABNORMAL HIGH (ref 70–99)
Glucose-Capillary: 130 mg/dL — ABNORMAL HIGH (ref 70–99)
Glucose-Capillary: 144 mg/dL — ABNORMAL HIGH (ref 70–99)
Glucose-Capillary: 113 mg/dL — ABNORMAL HIGH (ref 70–99)

## 2017-09-01 LAB — TROPONIN I
Troponin I: 0.11 ng/mL (ref <0.03)
Troponin I: 0.09 ng/mL (ref <0.03)

## 2017-09-01 LAB — MAGNESIUM: Magnesium: 2 mg/dL (ref 1.7–2.4)

## 2017-09-01 NOTE — Progress Notes (Signed)
Progress Note  Patient Name: Paul Todd Date of Encounter: 09/01/2017  Primary Cardiologist: Pixie Casino, MD   Subjective   Breathing is a little short compared to yesterday  No CP    Inpatient Medications    Scheduled Meds: . aspirin EC  81 mg Oral Daily  . atorvastatin  80 mg Oral q1800  . bisacodyl  10 mg Oral Daily  . Chlorhexidine Gluconate Cloth  6 each Topical Daily  . docusate sodium  200 mg Oral Daily  . DULoxetine  60 mg Oral QPM  . furosemide  40 mg Oral Daily  . insulin aspart  0-24 Units Subcutaneous TID WC  . insulin aspart  4 Units Subcutaneous TID WC  . insulin detemir  15 Units Subcutaneous BID  . losartan  25 mg Oral Daily  . metoprolol tartrate  25 mg Oral BID  . mometasone-formoterol  2 puff Inhalation BID  . pantoprazole  40 mg Oral Daily  . potassium chloride  20 mEq Oral BID  . rivaroxaban  20 mg Oral Q supper  . sodium chloride flush  10-40 mL Intracatheter Q12H  . sodium chloride flush  3 mL Intravenous Q12H   Continuous Infusions: . sodium chloride Stopped (08/25/17 0512)  . sodium chloride 10 mL/hr at 08/31/17 0954  . dextrose 5 % and 0.45% NaCl Stopped (08/31/17 1320)  . diltiazem (CARDIZEM) infusion 15 mg/hr (09/01/17 1039)   PRN Meds: metoprolol tartrate, ondansetron (ZOFRAN) IV, oxyCODONE, sodium chloride flush, sodium chloride flush, traMADol   Vital Signs    Vitals:   09/01/17 0452 09/01/17 0735 09/01/17 0755 09/01/17 1246  BP: (!) 111/55 106/69  117/71  Pulse: 65 65  66  Resp: 15 (!) 28    Temp: 98 F (36.7 C) 98 F (36.7 C)  97.6 F (36.4 C)  TempSrc: Oral Oral  Oral  SpO2: 95% 93% 94% 94%  Weight:      Height:        Intake/Output Summary (Last 24 hours) at 09/01/2017 1350 Last data filed at 09/01/2017 1244 Gross per 24 hour  Intake 600 ml  Output 975 ml  Net -375 ml   Filed Weights   08/30/17 0339 08/31/17 0523 09/01/17 0438  Weight: 118.7 kg 117.2 kg 117.5 kg    Telemetry    Atrial flutter    Rates 90s  - Personally Reviewed  ECG      Physical Exam   GEN: No acute distress.   Neck: No JVD Cardiac:  Irreg Irreg  , no murmurs, rubs, or gallops.  Respiratory: Clear to auscultation bilaterally. GI: Soft, nontender, non-distended  MS: Tr edema; No deformity. Neuro:  Nonfocal  Psych: Normal affect   Labs    Chemistry Recent Labs  Lab 08/29/17 0413 08/31/17 0551 09/01/17 0427  NA 138 136 139  K 3.6 4.2 4.1  CL 104 104 104  CO2 26 26 26   GLUCOSE 121* 124* 125*  BUN 20 14 14   CREATININE 0.98 0.92 0.86  CALCIUM 8.4* 8.6* 8.7*  GFRNONAA >60 >60 >60  GFRAA >60 >60 >60  ANIONGAP 8 6 9      Hematology Recent Labs  Lab 08/27/17 0247 08/28/17 0330 08/29/17 0413  WBC 14.3* 12.8* 10.9*  RBC 3.59* 3.65* 3.41*  HGB 10.4* 10.7* 9.9*  HCT 33.9* 34.4* 32.1*  MCV 94.4 94.2 94.1  MCH 29.0 29.3 29.0  MCHC 30.7 31.1 30.8  RDW 15.4 15.6* 15.5  PLT 117* 173 201    Cardiac Enzymes  Recent Labs  Lab 09/01/17 0752  TROPONINI 0.11*   No results for input(s): TROPIPOC in the last 168 hours.   BNPNo results for input(s): BNP, PROBNP in the last 168 hours.   DDimer No results for input(s): DDIMER in the last 168 hours.   Radiology    No results found.  Cardiac Studies     Patient Profile     57 y.o. male Hx PAF and CAD   Now s/p CABG    Assessment & Plan    1  Atrial flutter   Pt reverted to atrial flutter yesterday   Rates OK Discussed with J Allred   Will D/C Tikosyn At 24 hours restart amiodarone and continue as outpt    Continue Xarelto    Will f/u with afib cilnic as outpt to discuss options for possible ablation   Will cut back on Cozaar and increase metoprolol  2  S/p CABG    For questions or updates, please contact [facility] Please consult [email or web address] for contact info under Cardiology/STEMI.      Signed, Dorris Carnes, MD  09/01/2017, 1:50 PM

## 2017-09-01 NOTE — Progress Notes (Signed)
CARDIAC REHAB PHASE I   PRE:  Rate/Rhythm: 88 afib  BP:  Sitting: 106/69      SaO2: 96 RA  MODE:  Ambulation: 500 ft   133 peak HR  POST:  Rate/Rhythm: 108 afib  BP:  Sitting: 110/62    SaO2: 95 RA   Pt ambulated 547ft, two standing rest breaks. Pt c/o some chest pain and some shortness of breath. RN aware. Pt feels ready to go home, but understands the goal is to keep him at home once he gets to go. Will continue to follow.  [phone] Rufina Falco, RN BSN 09/01/2017 11:05 AM

## 2017-09-01 NOTE — Progress Notes (Signed)
CRITICAL VALUE ALERT  Critical Value: troponin level 0.11  Date & Time Notied:  09/01/2017 @0923   Provider Notified: Tama Headings , PA  Orders Received/Actions taken:

## 2017-09-01 NOTE — Progress Notes (Signed)
AM EKG shows critical STEMI. MD on call Dr. [place] Lions notified. No new orders given. Patient complaining of headache, has not slept well through the night. Oxycodone given as ordered.
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
## 2017-09-01 NOTE — Progress Notes (Signed)
Progress Note  Patient Name: Paul Todd Date of Encounter: 09/01/2017  Primary Cardiologist: Pixie Casino, MD   Subjective     Inpatient Medications    Scheduled Meds: . aspirin EC  81 mg Oral Daily  . atorvastatin  80 mg Oral q1800  . bisacodyl  10 mg Oral Daily  . Chlorhexidine Gluconate Cloth  6 each Topical Daily  . docusate sodium  200 mg Oral Daily  . dofetilide  250 mcg Oral BID  . DULoxetine  60 mg Oral QPM  . furosemide  40 mg Oral Daily  . insulin aspart  0-24 Units Subcutaneous TID WC  . insulin aspart  4 Units Subcutaneous TID WC  . insulin detemir  15 Units Subcutaneous BID  . losartan  25 mg Oral Daily  . metoprolol tartrate  25 mg Oral BID  . mometasone-formoterol  2 puff Inhalation BID  . pantoprazole  40 mg Oral Daily  . potassium chloride  20 mEq Oral BID  . rivaroxaban  20 mg Oral Q supper  . sodium chloride flush  10-40 mL Intracatheter Q12H  . sodium chloride flush  3 mL Intravenous Q12H   Continuous Infusions: . sodium chloride Stopped (08/25/17 0512)  . sodium chloride 10 mL/hr at 08/31/17 0954  . dextrose 5 % and 0.45% NaCl Stopped (08/31/17 1320)  . diltiazem (CARDIZEM) infusion 15 mg/hr (09/01/17 0424)   PRN Meds: metoprolol tartrate, ondansetron (ZOFRAN) IV, oxyCODONE, sodium chloride flush, sodium chloride flush, traMADol   Vital Signs    Vitals:   09/01/17 0438 09/01/17 0452 09/01/17 0735 09/01/17 0755  BP:  (!) 111/55 106/69   Pulse:  65 65   Resp:  15 (!) 28   Temp:  98 F (36.7 C) 98 F (36.7 C)   TempSrc:  Oral Oral   SpO2:  95% 93% 94%  Weight: 117.5 kg     Height:        Intake/Output Summary (Last 24 hours) at 09/01/2017 1025 Last data filed at 09/01/2017 0448 Gross per 24 hour  Intake 520 ml  Output 550 ml  Net -30 ml   Filed Weights   08/30/17 0339 08/31/17 0523 09/01/17 0438  Weight: 118.7 kg 117.2 kg 117.5 kg    Telemetry     - Personally Reviewed  ECG    No new - Personally  Reviewed  Physical Exam   GEN: No acute distress.   Neck: No JVD Cardiac: mildly irreg irreg, no murmurs, rubs, or gallops.  Respiratory: Clear to diminished breath sounds auscultation bilaterally. GI: Soft, nontender, non-distended  MS: 2+ edema of lower ext; No deformity. Neuro:  Nonfocal  Psych: Normal affect   Labs    Chemistry Recent Labs  Lab 08/29/17 0413 08/31/17 0551 09/01/17 0427  NA 138 136 139  K 3.6 4.2 4.1  CL 104 104 104  CO2 26 26 26   GLUCOSE 121* 124* 125*  BUN 20 14 14   CREATININE 0.98 0.92 0.86  CALCIUM 8.4* 8.6* 8.7*  GFRNONAA >60 >60 >60  GFRAA >60 >60 >60  ANIONGAP 8 6 9      Hematology Recent Labs  Lab 08/27/17 0247 08/28/17 0330 08/29/17 0413  WBC 14.3* 12.8* 10.9*  RBC 3.59* 3.65* 3.41*  HGB 10.4* 10.7* 9.9*  HCT 33.9* 34.4* 32.1*  MCV 94.4 94.2 94.1  MCH 29.0 29.3 29.0  MCHC 30.7 31.1 30.8  RDW 15.4 15.6* 15.5  PLT 117* 173 201    Cardiac Enzymes Recent Labs  Lab  09/01/17 0752  TROPONINI 0.11*   No results for input(s): TROPIPOC in the last 168 hours.   BNPNo results for input(s): BNP, PROBNP in the last 168 hours.   DDimer No results for input(s): DDIMER in the last 168 hours.   Radiology    No results found.  Cardiac Studies   08/21/17 Echo Study Conclusions  - Left ventricle: The cavity size was normal. Wall thickness was   increased in a pattern of mild LVH. Systolic function was normal.   The estimated ejection fraction was in the range of 55% to 60%.   Wall motion was normal; there were no regional wall motion   abnormalities. Doppler parameters are consistent with abnormal   left ventricular relaxation (grade 1 diastolic dysfunction).   Cardiac cath 08/21/17  The left ventricular ejection fraction is 55-65% by visual estimate.  LV end diastolic pressure is normal.  The left ventricular systolic function is normal.  Previously placed Prox RCA to Mid RCA stent (unknown type) is widely patent.  Dist  LM lesion is 75% stenosed.  Ost LAD lesion is 75% stenosed.  Previously placed Prox LAD stent (unknown type) is widely patent.   1. Severe distal left main and ostial LAD stenosis confirmed by positive FFR analysis (0.76) 2. Continued patency of the stented segments in the LAD and RCA 3. Patent left circumflex 4. Normal LV systolic function  Recommend: TCTS consult for CABG. IV heparin, inpatient status with severe left main stenosis.   Patient Profile     57 y.o. male with known coronary disease and peripheral vascular disease,atrial fibrillation on Tikosyn and Xarelto and DM,who presentedwith an episode of chest discomfort concerning for unstable angina. Found to have severe disease on cath, recommended for CABG. Now s/p CABG 08/24/17. Post op recovery c/b atrial flutter w/ RVR.  Assessment & Plan    CAD  --s/p CABG X 3 (LIMA-LAD, SVG-OM, SVG- RI). EF normal at 55-60%. 67 days post op  --medical therapy, ASA atorva, losartan, lopressor  A fib/flutter  --tikosyn and xarelto reloadng with tikosyn with post op a flutter, Tikasyn 250 BID--home dose, BB and dilt drip.  Hx PE 2016       For questions or updates, please contact [place] Please consult [email or web address] for contact info under Cardiology/STEMI.      Signed, Dorris Carnes, MD  09/01/2017, 10:25 AM    Case reviewed   Agree with plans as noted above by Serita Butcher   PT for DCCV today    Dorris Carnes

## 2017-09-02 ENCOUNTER — Encounter (HOSPITAL_COMMUNITY): Payer: Self-pay | Admitting: Cardiology

## 2017-09-02 LAB — MAGNESIUM: Magnesium: 2 mg/dL (ref 1.7–2.4)

## 2017-09-02 LAB — GLUCOSE, CAPILLARY
Glucose-Capillary: 107 mg/dL — ABNORMAL HIGH (ref 70–99)
Glucose-Capillary: 97 mg/dL (ref 70–99)
Glucose-Capillary: 124 mg/dL — ABNORMAL HIGH (ref 70–99)

## 2017-09-02 MED ORDER — OXYCODONE HCL 10 MG PO TABS: 10.0000 mg | ORAL_TABLET | ORAL | 0 refills | Status: DC | PRN

## 2017-09-02 MED ORDER — METOPROLOL TARTRATE 37.5 MG PO TABS: 37.5000 mg | ORAL_TABLET | Freq: Two times a day (BID) | ORAL | Status: DC

## 2017-09-02 MED ORDER — AMIODARONE HCL 200 MG PO TABS: 200.0000 mg | ORAL_TABLET | Freq: Two times a day (BID) | ORAL | 3 refills | Status: DC

## 2017-09-02 MED ORDER — METOPROLOL TARTRATE 37.5 MG PO TABS: 37.5000 mg | ORAL_TABLET | Freq: Two times a day (BID) | ORAL | 3 refills | Status: DC

## 2017-09-02 MED ORDER — FUROSEMIDE 10 MG/ML IJ SOLN
INTRAMUSCULAR | Status: AC
  Filled 2017-09-02: qty 4

## 2017-09-02 MED ORDER — METOPROLOL TARTRATE 25 MG PO TABS
37.5000 mg | ORAL_TABLET | Freq: Two times a day (BID) | ORAL | Status: DC
  Administered 2017-09-02: 37.5 mg via ORAL
  Filled 2017-09-02: qty 1

## 2017-09-02 MED ORDER — POTASSIUM CHLORIDE CRYS ER 20 MEQ PO TBCR
40.0000 meq | EXTENDED_RELEASE_TABLET | Freq: Once | ORAL | Status: AC
  Administered 2017-09-02: 40 meq via ORAL
  Filled 2017-09-02: qty 2

## 2017-09-02 MED ORDER — DILTIAZEM HCL ER 120 MG PO CP12: 120.0000 mg | ORAL_CAPSULE | Freq: Two times a day (BID) | ORAL | 3 refills | Status: DC

## 2017-09-02 MED ORDER — FUROSEMIDE 40 MG PO TABS: 40.0000 mg | ORAL_TABLET | Freq: Every day | ORAL | Status: DC

## 2017-09-02 MED ORDER — FUROSEMIDE 10 MG/ML IJ SOLN
80.0000 mg | Freq: Once | INTRAMUSCULAR | Status: AC
  Administered 2017-09-02: 80 mg via INTRAVENOUS
  Filled 2017-09-02: qty 8

## 2017-09-02 MED ORDER — FUROSEMIDE 40 MG PO TABS: 40.0000 mg | ORAL_TABLET | Freq: Every day | ORAL | 0 refills | Status: DC

## 2017-09-02 MED ORDER — DILTIAZEM HCL ER 60 MG PO CP12
120.0000 mg | ORAL_CAPSULE | Freq: Two times a day (BID) | ORAL | Status: DC
  Administered 2017-09-02: 120 mg via ORAL
  Filled 2017-09-02 (×2): qty 2

## 2017-09-02 MED ORDER — BLOOD GLUCOSE MONITOR KIT: PACK | 0 refills | Status: AC

## 2017-09-02 NOTE — Care Management Note (Signed)
Case Management Note Marvetta Gibbons RN, BSN Unit 4E- RN Care Coordinator  [phone]  Patient Details  Name: Paul Todd MRN: [id number] Date of Birth: Jan 28, 1960  Subjective/Objective:   Pt admitted with CAD, MVD s/p CABG x3 on 8/19                  Action/Plan: PTA pt lived at home with spouse, was on Tikosyn 260mcg PTA- is being reloaded post op with Tikosyn. CM will follow for transition of care needs  Expected Discharge Date:  09/02/17               Expected Discharge Plan:  Home/Self Care  In-House Referral:  NA  Discharge planning Services  CM Consult, Medication Assistance  Post Acute Care Choice:    Choice offered to:     DME Arranged:    DME Agency:     HH Arranged:    [place] Agency:     Status of Service:  Completed, signed off  If discussed at [employer] of Stay Meetings, dates discussed:    Discharge Disposition: home/self care   Additional Comments:  08/31/17- 1425- Cole Klugh RN, CM- pt stable today for transition home. Home with spouse- no CM needs noted for transition home.   Dawayne Patricia, RN 09/02/2017, 2:24 PM

## 2017-09-02 NOTE — Anesthesia Postprocedure Evaluation (Signed)
Anesthesia Post Note  Patient: OSWELL SAY  Procedure(s) Performed: TRANSESOPHAGEAL ECHOCARDIOGRAM (TEE) (N/A ) CARDIOVERSION (N/A )     Patient location during evaluation: Endoscopy Anesthesia Type: General Level of consciousness: awake and alert Pain management: pain level controlled Vital Signs Assessment: post-procedure vital signs reviewed and stable Respiratory status: spontaneous breathing, nonlabored ventilation, respiratory function stable and patient connected to nasal cannula oxygen Cardiovascular status: blood pressure returned to baseline and stable Postop Assessment: no apparent nausea or vomiting Anesthetic complications: no    Last Vitals:  Vitals:   09/02/17 0409 09/02/17 0913  BP: 113/64   Pulse: 63   Resp: 17 17  Temp:    SpO2:      Last Pain:  Vitals:   09/02/17 0913  TempSrc:   PainSc: 8                  Rodderick Holtzer

## 2017-09-02 NOTE — Progress Notes (Signed)
Progress Note  Patient Name: Paul Todd Date of Encounter: 09/02/2017  Primary Cardiologist: Pixie Casino, MD   Subjective   Breathing is OK  No dizziness   No CP   Inpatient Medications    Scheduled Meds: . aspirin EC  81 mg Oral Daily  . atorvastatin  80 mg Oral q1800  . bisacodyl  10 mg Oral Daily  . Chlorhexidine Gluconate Cloth  6 each Topical Daily  . docusate sodium  200 mg Oral Daily  . DULoxetine  60 mg Oral QPM  . furosemide  40 mg Oral Daily  . insulin aspart  0-24 Units Subcutaneous TID WC  . insulin aspart  4 Units Subcutaneous TID WC  . insulin detemir  15 Units Subcutaneous BID  . metoprolol tartrate  25 mg Oral BID  . mometasone-formoterol  2 puff Inhalation BID  . pantoprazole  40 mg Oral Daily  . potassium chloride  20 mEq Oral BID  . rivaroxaban  20 mg Oral Q supper  . sodium chloride flush  10-40 mL Intracatheter Q12H  . sodium chloride flush  3 mL Intravenous Q12H   Continuous Infusions: . sodium chloride Stopped (08/25/17 0512)  . sodium chloride 10 mL/hr at 08/31/17 0954  . dextrose 5 % and 0.45% NaCl Stopped (08/31/17 1320)  . diltiazem (CARDIZEM) infusion 15 mg/hr (09/02/17 0423)   PRN Meds: metoprolol tartrate, ondansetron (ZOFRAN) IV, oxyCODONE, sodium chloride flush, sodium chloride flush, traMADol   Vital Signs    Vitals:   09/01/17 1946 09/01/17 2100 09/02/17 0235 09/02/17 0409  BP:  126/78  113/64  Pulse:    63  Resp:  18  17  Temp:  (!) 97.5 F (36.4 C)    TempSrc:  Oral    SpO2: 96% 99%    Weight:   117 kg   Height:        Intake/Output Summary (Last 24 hours) at 09/02/2017 0800 Last data filed at 09/01/2017 1244 Gross per 24 hour  Intake 240 ml  Output 425 ml  Net -185 ml   Filed Weights   08/31/17 0523 09/01/17 0438 09/02/17 0235  Weight: 117.2 kg 117.5 kg 117 kg    Telemetry    Atrial flutter  Rates 60s     - Personally Reviewed  ECG      Physical Exam   GEN: No acute distress.   Neck: JVP is  increased   Cardiac:  Irreg Irreg  , no murmurs, rubs, or gallops.  Respiratory: Clear to auscultation bilaterally. GI: Soft, nontender, non-distended  MS: 1+ edema; No deformity. Neuro:  Nonfocal  Psych: Normal affect   Labs    Chemistry Recent Labs  Lab 08/29/17 0413 08/31/17 0551 09/01/17 0427  NA 138 136 139  K 3.6 4.2 4.1  CL 104 104 104  CO2 26 26 26   GLUCOSE 121* 124* 125*  BUN 20 14 14   CREATININE 0.98 0.92 0.86  CALCIUM 8.4* 8.6* 8.7*  GFRNONAA >60 >60 >60  GFRAA >60 >60 >60  ANIONGAP 8 6 9      Hematology Recent Labs  Lab 08/27/17 0247 08/28/17 0330 08/29/17 0413  WBC 14.3* 12.8* 10.9*  RBC 3.59* 3.65* 3.41*  HGB 10.4* 10.7* 9.9*  HCT 33.9* 34.4* 32.1*  MCV 94.4 94.2 94.1  MCH 29.0 29.3 29.0  MCHC 30.7 31.1 30.8  RDW 15.4 15.6* 15.5  PLT 117* 173 201    Cardiac Enzymes Recent Labs  Lab 09/01/17 0752 09/01/17 1523  TROPONINI 0.11*  0.09*   No results for input(s): TROPIPOC in the last 168 hours.   BNPNo results for input(s): BNP, PROBNP in the last 168 hours.   DDimer No results for input(s): DDIMER in the last 168 hours.   Radiology    No results found.  Cardiac Studies     Patient Profile     57 y.o. male Hx PAF and CAD   Now s/p CABG    Assessment & Plan    1  Atrial flutter   Rates controlled   I have reviewed with J ALlred Recomm Tikosyn to be stopped   This ws done yesterday.   Start amiodarone 200 bid tomorrow   Keep on diltiazem   Would switch to po and follow HR and BP   Amio will slow HR over time Continue Xarelto    Pt will need outpt f/u   WIll set up for afib clinic    2  S/p CABG    Pt with increased volume on exam   Would recomm IV lsix today   Will need f/u as outpt   It should be OK for pt to go later today from rhythm standpoint   Again, will set up for close f/u  For questions or updates, please contact [place] Please consult [email or web address] for contact info under Cardiology/STEMI.       Signed, Dorris Carnes, MD  09/02/2017, 8:00 AM

## 2017-09-02 NOTE — Progress Notes (Signed)
Pt has a follow up appointment in the AFib Clinic on 09/11/17 at 0830 am. The garage code will be 1600. Appointment date and time placed in chart.   Kathyrn Drown NP-C [place] Pager: [phone]

## 2017-09-02 NOTE — Progress Notes (Signed)
Sutures and central line removed. Steri strips applied to CT suture site; and gauze applied to R chest after CL removal. Pt educated. Pt tolerated well.   Lilla Shook , BSN

## 2017-09-02 NOTE — Progress Notes (Addendum)
patient examined and medical record reviewed,agree with above note. Paul Todd 09/02/2017       [address]       [place] [address]             [phone]      2 Days Post-Op Procedure(s) (LRB): TRANSESOPHAGEAL ECHOCARDIOGRAM (TEE) (N/A) CARDIOVERSION (N/A)   Subjective:  No new complaints.  Continues to have a headache at times.  He is frustrated with his heart rate.  He really wants to go home.  Objective: Vital signs in last 24 hours: Temp:  [97.5 F (36.4 C)-98.5 F (36.9 C)] 97.5 F (36.4 C) (08/27 2100) Pulse Rate:  [63-78] 63 (08/28 0409) Cardiac Rhythm: Atrial flutter (08/28 0700) Resp:  [17-18] 17 (08/28 0409) BP: (113-126)/(63-78) 113/64 (08/28 0409) SpO2:  [94 %-99 %] 99 % (08/27 2100) Weight:  [382 kg] 117 kg (08/28 0235)  Intake/Output from previous day: 08/27 0701 - 08/28 0700 In: 480 [P.O.:480] Out: 425 [Urine:425]  General appearance: alert, cooperative and no distress Heart: regular rate and rhythm Lungs: clear to auscultation bilaterally Abdomen: soft, non-tender; bowel sounds normal; no masses,  no organomegaly Extremities: edema trace Wound: clean and dry  Lab Results: No results for input(s): WBC, HGB, HCT, PLT in the last 72 hours. BMET:  Recent Labs    08/31/17 0551 09/01/17 0427  NA 136 139  K 4.2 4.1  CL 104 104  CO2 26 26  GLUCOSE 124* 125*  BUN 14 14  CREATININE 0.92 0.86  CALCIUM 8.6* 8.7*    PT/INR: No results for input(s): LABPROT, INR in the last 72 hours. ABG    Component Value Date/Time   PHART 7.421 08/26/2017 0506   HCO3 25.8 08/26/2017 0506   TCO2 27 08/26/2017 0506   ACIDBASEDEF 2.0 08/25/2017 1821   O2SAT 99.0 08/26/2017 0506   CBG (last 3)  Recent Labs    09/01/17 1558 09/01/17 2126 09/02/17 0620  GLUCAP 144* 113* 107*    Assessment/Plan: S/P Procedure(s) (LRB): TRANSESOPHAGEAL ECHOCARDIOGRAM (TEE) (N/A) CARDIOVERSION (N/A)  1. CV- remains in rate controlled A. Fib,  per Cardiology and EP... Tikosyn stopped yesterday, will plan to start Amiodarone 200 mg BID starting tomorrow, Cozaar discontinued, increase Lopressor to 37.5 mg BID, on home Xarelto 2. Pulm- no acute issues, continue IS 3. Renal- creatinine WNL, weight is stable, will continue Lasix, potassium supplementation, Mg is WNL 4. DM- sugars well controlled, patient newly diagnosed... Will give prescription for meter kits, and set up outpatient referral for nutrional consult 5. Dispo- patient stable, remains in A. Flutter, medications adjusted per Cardiology, continue Xarelto, will arrange follow up in A. Fib clinic, will tentatively plan for d/c home today   LOS: 11 days    Paul Todd 09/02/2017

## 2017-09-02 NOTE — Progress Notes (Signed)
Pt given AVS handout and prescriptions. Pt verbalized understanding. IV removed. Pt stable at time of discharge. Pt escorted to main entrance via wheelchair with RN. Pt went home with wife and daughter.  Lilla Shook, BSN

## 2017-09-04 ENCOUNTER — Telehealth (HOSPITAL_COMMUNITY): Payer: Self-pay

## 2017-09-04 ENCOUNTER — Ambulatory Visit (HOSPITAL_COMMUNITY): Payer: Self-pay | Admitting: Nurse Practitioner

## 2017-09-04 NOTE — Telephone Encounter (Signed)
Pt insurance is active and benefits verified through [place]. Co-pay $0.00, DED $2,800.00/$2,800.00 met, out of pocket $5,600.00/$5,600.00 met, co-insurance 20%. No pre-authorization. Passport, 09/04/17 @ 11:03AM, [facility]#[id number]  Will contact patient to see if he is interested in the Cardiac Rehab Program. If interested, patient will need to complete follow up appt. Once completed, patient will be contacted for scheduling upon review by the RN Navigator.

## 2017-09-04 NOTE — Telephone Encounter (Signed)
Attempted to call patient in regards to Cardiac Rehab - LM on VM 

## 2017-09-11 ENCOUNTER — Ambulatory Visit (HOSPITAL_COMMUNITY)
Admission: RE | Admit: 2017-09-11 | Discharge: 2017-09-11 | Disposition: A | Discharge status: Home / Self Care | Payer: BLUE CROSS/BLUE SHIELD | Source: Ambulatory Visit | Attending: Nurse Practitioner | Admitting: Nurse Practitioner

## 2017-09-11 ENCOUNTER — Encounter (HOSPITAL_COMMUNITY): Payer: Self-pay | Admitting: Nurse Practitioner

## 2017-09-11 VITALS — BP 124/76 | HR 58 | Ht 67.0 in | Wt 252.0 lb

## 2017-09-11 DIAGNOSIS — Z9889 Other specified postprocedural states: Secondary | ICD-10-CM | POA: Diagnosis not present

## 2017-09-11 DIAGNOSIS — M1990 Unspecified osteoarthritis, unspecified site: Secondary | ICD-10-CM | POA: Diagnosis not present

## 2017-09-11 DIAGNOSIS — I4892 Unspecified atrial flutter: Secondary | ICD-10-CM | POA: Diagnosis not present

## 2017-09-11 DIAGNOSIS — I739 Peripheral vascular disease, unspecified: Secondary | ICD-10-CM | POA: Insufficient documentation

## 2017-09-11 DIAGNOSIS — I2510 Atherosclerotic heart disease of native coronary artery without angina pectoris: Secondary | ICD-10-CM | POA: Insufficient documentation

## 2017-09-11 DIAGNOSIS — Z86711 Personal history of pulmonary embolism: Secondary | ICD-10-CM | POA: Insufficient documentation

## 2017-09-11 DIAGNOSIS — Z951 Presence of aortocoronary bypass graft: Secondary | ICD-10-CM | POA: Insufficient documentation

## 2017-09-11 DIAGNOSIS — I252 Old myocardial infarction: Secondary | ICD-10-CM | POA: Insufficient documentation

## 2017-09-11 DIAGNOSIS — Z823 Family history of stroke: Secondary | ICD-10-CM | POA: Insufficient documentation

## 2017-09-11 DIAGNOSIS — G4733 Obstructive sleep apnea (adult) (pediatric): Secondary | ICD-10-CM | POA: Insufficient documentation

## 2017-09-11 DIAGNOSIS — I481 Persistent atrial fibrillation: Secondary | ICD-10-CM | POA: Diagnosis not present

## 2017-09-11 DIAGNOSIS — Z955 Presence of coronary angioplasty implant and graft: Secondary | ICD-10-CM | POA: Insufficient documentation

## 2017-09-11 DIAGNOSIS — Z79899 Other long term (current) drug therapy: Secondary | ICD-10-CM | POA: Insufficient documentation

## 2017-09-11 DIAGNOSIS — Z7901 Long term (current) use of anticoagulants: Secondary | ICD-10-CM | POA: Diagnosis not present

## 2017-09-11 DIAGNOSIS — R9431 Abnormal electrocardiogram [ECG] [EKG]: Secondary | ICD-10-CM | POA: Insufficient documentation

## 2017-09-11 DIAGNOSIS — E119 Type 2 diabetes mellitus without complications: Secondary | ICD-10-CM | POA: Diagnosis not present

## 2017-09-11 DIAGNOSIS — Z8249 Family history of ischemic heart disease and other diseases of the circulatory system: Secondary | ICD-10-CM | POA: Insufficient documentation

## 2017-09-11 DIAGNOSIS — Z87891 Personal history of nicotine dependence: Secondary | ICD-10-CM | POA: Diagnosis not present

## 2017-09-11 DIAGNOSIS — Z833 Family history of diabetes mellitus: Secondary | ICD-10-CM | POA: Diagnosis not present

## 2017-09-11 DIAGNOSIS — I484 Atypical atrial flutter: Secondary | ICD-10-CM | POA: Diagnosis not present

## 2017-09-11 DIAGNOSIS — I10 Essential (primary) hypertension: Secondary | ICD-10-CM | POA: Insufficient documentation

## 2017-09-11 DIAGNOSIS — Z7982 Long term (current) use of aspirin: Secondary | ICD-10-CM | POA: Insufficient documentation

## 2017-09-11 DIAGNOSIS — Z79891 Long term (current) use of opiate analgesic: Secondary | ICD-10-CM | POA: Insufficient documentation

## 2017-09-11 DIAGNOSIS — E785 Hyperlipidemia, unspecified: Secondary | ICD-10-CM | POA: Diagnosis not present

## 2017-09-11 NOTE — Progress Notes (Signed)
Primary Care Physician: Everardo Beals, NP Referring Physician: [facility] f/u  Cardiologist: [name] is a 57 y.o. male with a h/o paroxysmal afib, prior use of tikosyn, CAD, PVD,DM, HTN, Prior PE, that had a recent hospitalization for chest pain and underwent LHC and went on to have CABG x 3. He unfortunately went into rapid afib/flutter. He underwent cardioversion, resumed SR x one hour and then returned to atrial flutter. It was decided to stop Tikosyn and load on amiodarone. He was d/c in atrial flutter.  In the afib clinic, he feels improved. Not aware of heart rhythm. He is in rate controlled aflutter at 58 bpm. His weight is down 3 lbs from last weight. Sternotomy is healing well without drainage.  Today, he denies symptoms of palpitations, chest pain, shortness of breath, orthopnea, PND, lower extremity edema, dizziness, presyncope, syncope, or neurologic sequela. The patient is tolerating medications without difficulties and is otherwise without complaint today.   Past Medical History:  Diagnosis Date  . Arthritis       . CAD (coronary artery disease)    multivessel per cath 02/12/13, s/p stenting to RCA  . Chronic lower back pain   . Diabetes mellitus without complication ([place])   . Dyslipidemia   . History of kidney stones   . Hx of Bell's palsy   . Hypertension   . Myocardial infarction ([place]) 02/12/2013   . Nephrolithiasis   . Neuromuscular disorder ([facility])    Bell's Palsy  . OSA on CPAP   . PE (pulmonary thromboembolism) ([place]) 02/2014  . Persistent atrial fibrillation [facility])        Past Surgical History:  Procedure Laterality Date  . ABDOMINAL AORTOGRAM W/LOWER EXTREMITY N/A 01/26/2017   Procedure: ABDOMINAL AORTOGRAM W/LOWER EXTREMITY;  Surgeon: Angelia Mould, MD;  Location: [place] CV LAB;  Service: Cardiovascular;  Laterality: N/A;  . BACK SURGERY  2017   laminectomy  . CARDIOVERSION N/A 02/15/2013   Procedure: CARDIOVERSION;  Surgeon:  Thayer Headings, MD;  Location: [place];  Service: Cardiovascular;  Laterality: N/A;  . CARDIOVERSION N/A 07/07/2014   Procedure: CARDIOVERSION;  Surgeon: Pixie Casino, MD;  Location: [facility] ENDOSCOPY;  Service: Cardiovascular;  Laterality: N/A;  . CARDIOVERSION N/A 08/31/2017   Procedure: CARDIOVERSION;  Surgeon: Dorothy Spark, MD;  Location: [place];  Service: Cardiovascular;  Laterality: N/A;  . CORONARY ANGIOPLASTY WITH STENT PLACEMENT  02/12/2013  . CORONARY ARTERY BYPASS GRAFT N/A 08/24/2017   Procedure: CORONARY ARTERY BYPASS GRAFTING (CABG) X3. RIGHT ENDOSCOPIC SAPHENOUS VEIN HARVEST. MAMM TO LAD, SVG TO OM, SVG TO RAMUS;  Surgeon: Ivin Poot, MD;  Location: [place];  Service: Open Heart Surgery;  Laterality: N/A;  . CYSTOSCOPY WITH RETROGRADE PYELOGRAM, URETEROSCOPY AND STENT PLACEMENT Left 09/02/2013   Procedure: CYSTOSCOPY WITH RETROGRADE PYELOGRAM/LEFT URETEROSCOPY/URETERAL STENT, with fulguration;  Surgeon: Festus Aloe, MD;  Location: [facility];  Service: Urology;  Laterality: Left;  . CYSTOSCOPY WITH URETEROSCOPY AND STENT PLACEMENT Left 09/16/2013   Procedure: CYSTOSCOPY WITH URETEROSCOPY AND STENT PLACEMENT;  Surgeon: Festus Aloe, MD;  Location: [facility];  Service: Urology;  Laterality: Left;  . FEMORAL BYPASS Left 04/07/2017   below the knee popliteal  . FEMORAL-POPLITEAL BYPASS GRAFT Left 04/07/2017   Procedure: LEFT FEMORAL-BELOW KNEE POPLITEAL ARTERY BYPASS USING COMPOSITE LEFT GREATER SAPHENOUS VEIN;  Surgeon: Angelia Mould, MD;  Location: [place];  Service: Vascular;  Laterality: Left;  . HOLMIUM LASER APPLICATION Left 2/97/9892   Procedure: HOLMIUM LASER  APPLICATION;  Surgeon: Festus Aloe, MD;  Location: [facility];  Service: Urology;  Laterality: Left;  . INTRAVASCULAR PRESSURE WIRE/FFR STUDY N/A 08/21/2017   Procedure: INTRAVASCULAR PRESSURE WIRE/FFR STUDY;  Surgeon: Sherren Mocha, MD;  Location: [place] CV LAB;  Service: Cardiovascular;   Laterality: N/A;  . LEFT HEART CATH AND CORONARY ANGIOGRAPHY N/A 08/21/2017   Procedure: LEFT HEART CATH AND CORONARY ANGIOGRAPHY;  Surgeon: Sherren Mocha, MD;  Location: [place] CV LAB;  Service: Cardiovascular;  Laterality: N/A;  . LEFT HEART CATHETERIZATION WITH CORONARY ANGIOGRAM N/A 02/12/2013   Procedure: LEFT HEART CATHETERIZATION WITH CORONARY ANGIOGRAM;  Surgeon: Troy Sine, MD;  Location: [facility] CATH LAB;  Service: Cardiovascular;  Laterality: N/A;  . PERCUTANEOUS CORONARY STENT INTERVENTION (PCI-S)  02/12/2013   Procedure: PERCUTANEOUS CORONARY STENT INTERVENTION (PCI-S);  Surgeon: Troy Sine, MD;  Location: [facility] CATH LAB;  Service: Cardiovascular;;  STEMI  . PERCUTANEOUS CORONARY STENT INTERVENTION (PCI-S) N/A 02/14/2013   Procedure: PERCUTANEOUS CORONARY STENT INTERVENTION (PCI-S);  Surgeon: Burnell Blanks, MD;  Location: [facility] CATH LAB;  Service: Cardiovascular;  Laterality: N/A;  Proximal LAD  . TEE WITHOUT CARDIOVERSION N/A 02/15/2013   Procedure: TRANSESOPHAGEAL ECHOCARDIOGRAM (TEE);  Surgeon: Thayer Headings, MD;  Location: [place];  Service: Cardiovascular;  Laterality: N/A;  . TEE WITHOUT CARDIOVERSION N/A 08/24/2017   Procedure: TRANSESOPHAGEAL ECHOCARDIOGRAM (TEE);  Surgeon: Prescott Gum, Collier Salina, MD;  Location: [place];  Service: Open Heart Surgery;  Laterality: N/A;  . TEE WITHOUT CARDIOVERSION N/A 08/31/2017   Procedure: TRANSESOPHAGEAL ECHOCARDIOGRAM (TEE);  Surgeon: Dorothy Spark, MD;  Location: [facility] ENDOSCOPY;  Service: Cardiovascular;  Laterality: N/A;    Current Outpatient Medications  Medication Sig Dispense Refill  . acetaminophen (TYLENOL) 650 MG CR tablet Take 650 mg by mouth every 8 (eight) hours as needed for pain.    Marland Kitchen albuterol (PROVENTIL HFA;VENTOLIN HFA) 108 (90 Base) MCG/ACT inhaler Inhale 1-2 puffs into the lungs every 4 (four) hours as needed for wheezing or shortness of breath.     Marland Kitchen amiodarone (PACERONE) 200 MG tablet Take 1 tablet (200 mg total)  by mouth 2 (two) times daily. 60 tablet 3  . aspirin EC 81 MG tablet Take 81 mg by mouth daily.    Marland Kitchen atorvastatin (LIPITOR) 80 MG tablet TAKE 1 TABLET BY MOUTH EVERY DAY AT 6PM (Patient taking differently: Take 80 mg by mouth daily at 6 PM. ) 90 tablet 2  . blood glucose meter kit and supplies KIT Dispense based on patient and insurance preference. Use up to four times daily as directed. (FOR ICD-9 250.00, 250.01). 1 each 0  . diltiazem (CARDIZEM SR) 120 MG 12 hr capsule Take 1 capsule (120 mg total) by mouth every 12 (twelve) hours. 60 capsule 3  . docusate sodium (COLACE) 100 MG capsule Take 100 mg by mouth 2 (two) times daily.  5  . DULoxetine (CYMBALTA) 60 MG capsule Take 60 mg by mouth every evening.    . furosemide (LASIX) 40 MG tablet Take 1 tablet (40 mg total) by mouth daily. 30 tablet 0  . JARDIANCE 10 MG TABS tablet Take 10 mg by mouth daily.  3  . magnesium oxide (MAG-OX) 400 (241.3 MG) MG tablet Take 1 tablet (400 mg total) by mouth daily. (Patient taking differently: Take 600 mg by mouth daily. ) 30 tablet 11  . Metoprolol Tartrate 37.5 MG TABS Take 37.5 mg by mouth 2 (two) times daily.    . Multiple Vitamin (MULTIVITAMIN WITH MINERALS) TABS tablet  Take 1 tablet by mouth daily.     . nitroGLYCERIN (NITROSTAT) 0.4 MG SL tablet PLACE 1 TABLET UNDER TONGUE EVERY 5 MINUTES AS NEEDED FOR CHEST PAIN MAX 3 TABLETS (Patient taking differently: Place 0.4 mg under the tongue every 5 (five) minutes as needed for chest pain. ) 25 tablet 3  . NON FORMULARY at bedtime. CPAP    . nystatin (MYCOSTATIN/NYSTOP) powder Apply 1 application topically 2 (two) times daily as needed for rash.  2  . oxyCODONE 10 MG TABS Take 1 tablet (10 mg total) by mouth every 4 (four) hours as needed for severe pain. 30 tablet 0  . pantoprazole (PROTONIX) 40 MG tablet TAKE 1 TABLET BY MOUTH EVERY DAY (Patient taking differently: Take 40 mg by mouth daily. ) 30 tablet 11  . potassium chloride (K-DUR,KLOR-CON) 10 MEQ tablet  TAKE 1 TABLET BY MOUTH DAILY (Patient taking differently: Take 10 mEq by mouth daily. ) 30 tablet 0  . rivaroxaban (XARELTO) 20 MG TABS tablet Take 1 tablet (20 mg total) daily with supper by mouth. 90 tablet 3   No current facility-administered medications for this encounter.     No Known Allergies  Social History   Socioeconomic History  . Marital status: Married    Spouse name: Not on file  . Number of children: Not on file  . Years of education: Not on file  . Highest education level: Not on file  Occupational History  . Not on file  Social Needs  . Financial resource strain: Not on file  . Food insecurity:    Worry: Not on file    Inability: Not on file  . Transportation needs:    Medical: Not on file    Non-medical: Not on file  Tobacco Use  . Smoking status: Former Smoker    Packs/day: 0.50    Years: 35.00    Pack years: 17.50    Types: Cigarettes    Last attempt to quit: 02/12/2013    Years since quitting: 4.5  . Smokeless tobacco: Never Used  Substance and Sexual Activity  . Alcohol use: No    Comment: 07/21/2014 "never drank much; might have a couple drinks/yr since MI 02/2013"  . Drug use: No  . Sexual activity: Yes    Birth control/protection: None  Lifestyle  . Physical activity:    Days per week: Not on file    Minutes per session: Not on file  . Stress: Not on file  Relationships  . Social connections:    Talks on phone: Not on file    Gets together: Not on file    Attends religious service: Not on file    Active member of club or organization: Not on file    Attends meetings of clubs or organizations: Not on file    Relationship status: Not on file  . Intimate partner violence:    Fear of current or ex partner: Not on file    Emotionally abused: Not on file    Physically abused: Not on file    Forced sexual activity: Not on file  Other Topics Concern  . Not on file  Social History Narrative  . Not on file    Family History  Problem  Relation Age of Onset  . Heart disease Mother   . Diabetes Mother   . Stroke Mother   . Diabetes Father     ROS- All systems are reviewed and negative except as per the HPI above  Physical Exam:  Vitals:   09/11/17 0832  BP: 124/76  Pulse: (!) 58  Weight: 114.3 kg  Height: '5\' 7"'$  (1.702 m)   Wt Readings from Last 3 Encounters:  09/11/17 114.3 kg  09/02/17 117 kg  07/08/17 119.7 kg    Labs: Lab Results  Component Value Date   NA 139 09/01/2017   K 4.1 09/01/2017   CL 104 09/01/2017   CO2 26 09/01/2017   GLUCOSE 125 (H) 09/01/2017   BUN 14 09/01/2017   CREATININE 0.86 09/01/2017   CALCIUM 8.7 (L) 09/01/2017   MG 2.0 09/02/2017   Lab Results  Component Value Date   INR 1.27 08/24/2017   Lab Results  Component Value Date   CHOL 88 08/29/2017   HDL 23 (L) 08/29/2017   LDLCALC 41 08/29/2017   TRIG 118 08/29/2017     GEN- The patient is well appearing, alert and oriented x 3 today.   Head- normocephalic, atraumatic Eyes-  Sclera clear, conjunctiva pink Ears- hearing intact Oropharynx- clear Neck- supple, no JVP Lymph- no cervical lymphadenopathy Lungs- Clear to ausculation bilaterally, normal work of breathing Heart- Regular rate and rhythm, no murmurs, rubs or gallops, PMI not laterally displaced. Sternotomy site healing well. GI- soft, NT, ND, + BS Extremities- no clubbing, cyanosis, or edema. Vein graft site healing well, rt leg. MS- no significant deformity or atrophy Skin- no rash or lesion Psych- euthymic mood, full affect Neuro- strength and sensation are intact  EKG- Atrial flutter at 58 bpm, 4:1 AV conduction, qrs int 100 ms, qtc 410 ms( stable) Epic records reviewed    Assessment and Plan: 1. Atrial fib/flutter Pt is loading on amiodarone 200 mg bid,started 8/18 He is rate controlled He is taking xarelto 20 mg daily, states no missed doses Will plan for cardioversion after pt has sufficient load  of amiodarone  2. CABG x 3 Appears to be  recovering well from surgery  3. HTN Stable  F/u in 2 weeks  Paul Todd. Paul Todd, [facility] [address] [place], [place] [address] [phone]

## 2017-09-13 NOTE — Progress Notes (Signed)
Cardiology Office Note   Date:  09/15/2017   ID:  AIMEE TIMMONS, DOB [birth year], MRN [id number]  PCP:  Everardo Beals, NP  Cardiologist: [name]  Chief Complaint  Patient presents with  . Atrial Fibrillation  . Coronary Artery Disease    s/p CABG     History of Present Illness: Paul Todd is a 57 y.o. male who presents for ongoing assessment and management of paroxysmal atrial fibrillation, also followed by Paul Todd nurse practitioner in the A. fib clinic.  The patient has other history to include coronary artery disease, status post coronary artery bypass grafting x3) LIMA to LAD, SVG to ramus intermediate, and SVG to OM on 08/24/2017), peripheral vascular disease (status post left femoropopliteal), hypertension, prior PE, and type 2 diabetes.  The patient had a successful TEE cardioversion on 08/31/2017 but reverted back to atrial flutter.  Most recently seen by Ms. Kayleen Memos, NP on 09/11/2017 and was found to be in rate controlled atrial flutter at 58 bpm.  The patient was unaware of his irregular heart rhythm.  The patient was continued on amiodarone loading 200 mg twice daily, Xarelto 20 mg daily for anticoagulation.  The patient is being considered for cardioversion.  He comes today with generalized fatigue and frustration about continuing to be in atrial fib. He has also had his dog jump on him causing him to twist and almost fall with significant recurrence of right back, right flank and right sternal pain. He has recently restarted using home CPAP for OSA.  He denies bleeding or significant bruising.   Past Medical History:  Diagnosis Date  . Arthritis       . CAD (coronary artery disease)    multivessel per cath 02/12/13, s/p stenting to RCA  . Chronic lower back pain   . Diabetes mellitus without complication ([place])   . Dyslipidemia   . History of kidney stones   . Hx of Bell's palsy   . Hypertension   . Myocardial infarction ([place]) 02/12/2013   . Nephrolithiasis    . Neuromuscular disorder (H[facility])    Bell's Palsy  . OSA on CPAP   . PE (pulmonary thromboembolism) ([place]) 02/2014  . Persistent atrial fibrillation [facility])         Past Surgical History:  Procedure Laterality Date  . ABDOMINAL AORTOGRAM W/LOWER EXTREMITY N/A 01/26/2017   Procedure: ABDOMINAL AORTOGRAM W/LOWER EXTREMITY;  Surgeon: Angelia Mould, MD;  Location: [place] [facility];  Service: Cardiovascular;  Laterality: N/A;  . BACK SURGERY  2017   laminectomy  . CARDIOVERSION N/A 02/15/2013   Procedure: CARDIOVERSION;  Surgeon: Thayer Headings, MD;  Location: [place];  Service: Cardiovascular;  Laterality: N/A;  . CARDIOVERSION N/A 07/07/2014   Procedure: CARDIOVERSION;  Surgeon: Pixie Casino, MD;  Location: [facility] ENDOSCOPY;  Service: Cardiovascular;  Laterality: N/A;  . CARDIOVERSION N/A 08/31/2017   Procedure: CARDIOVERSION;  Surgeon: Dorothy Spark, MD;  Location: [place];  Service: Cardiovascular;  Laterality: N/A;  . CORONARY ANGIOPLASTY WITH STENT PLACEMENT  02/12/2013  . CORONARY ARTERY BYPASS GRAFT N/A 08/24/2017   Procedure: CORONARY ARTERY BYPASS GRAFTING (CABG) X3. RIGHT ENDOSCOPIC SAPHENOUS VEIN HARVEST. MAMM TO LAD, SVG TO OM, SVG TO RAMUS;  Surgeon: Ivin Poot, MD;  Location: [place];  Service: Open Heart Surgery;  Laterality: N/A;  . CYSTOSCOPY WITH RETROGRADE PYELOGRAM, URETEROSCOPY AND STENT PLACEMENT Left 09/02/2013   Procedure: CYSTOSCOPY WITH RETROGRADE PYELOGRAM/LEFT URETEROSCOPY/URETERAL STENT, with fulguration;  Surgeon: Festus Aloe, MD;  Location:  [facility];  Service: Urology;  Laterality: Left;  . CYSTOSCOPY WITH URETEROSCOPY AND STENT PLACEMENT Left 09/16/2013   Procedure: CYSTOSCOPY WITH URETEROSCOPY AND STENT PLACEMENT;  Surgeon: Festus Aloe, MD;  Location: [facility];  Service: Urology;  Laterality: Left;  . FEMORAL BYPASS Left 04/07/2017   below the knee popliteal  . FEMORAL-POPLITEAL BYPASS GRAFT Left 04/07/2017   Procedure: LEFT FEMORAL-BELOW  KNEE POPLITEAL ARTERY BYPASS USING COMPOSITE LEFT GREATER SAPHENOUS VEIN;  Surgeon: Angelia Mould, MD;  Location: [place];  Service: Vascular;  Laterality: Left;  . HOLMIUM LASER APPLICATION Left 9/62/8366   Procedure: HOLMIUM LASER APPLICATION;  Surgeon: Festus Aloe, MD;  Location: [facility];  Service: Urology;  Laterality: Left;  . INTRAVASCULAR PRESSURE WIRE/FFR STUDY N/A 08/21/2017   Procedure: INTRAVASCULAR PRESSURE WIRE/FFR STUDY;  Surgeon: Sherren Mocha, MD;  Location: [place] [facility];  Service: Cardiovascular;  Laterality: N/A;  . LEFT HEART CATH AND CORONARY ANGIOGRAPHY N/A 08/21/2017   Procedure: LEFT HEART CATH AND CORONARY ANGIOGRAPHY;  Surgeon: Sherren Mocha, MD;  Location: [place] [facility];  Service: Cardiovascular;  Laterality: N/A;  . LEFT HEART CATHETERIZATION WITH CORONARY ANGIOGRAM N/A 02/12/2013   Procedure: LEFT HEART CATHETERIZATION WITH CORONARY ANGIOGRAM;  Surgeon: Troy Sine, MD;  Location: [facility] CATH LAB;  Service: Cardiovascular;  Laterality: N/A;  . PERCUTANEOUS CORONARY STENT INTERVENTION (PCI-S)  02/12/2013   Procedure: PERCUTANEOUS CORONARY STENT INTERVENTION (PCI-S);  Surgeon: Troy Sine, MD;  Location: [facility] CATH LAB;  Service: Cardiovascular;;  STEMI  . PERCUTANEOUS CORONARY STENT INTERVENTION (PCI-S) N/A 02/14/2013   Procedure: PERCUTANEOUS CORONARY STENT INTERVENTION (PCI-S);  Surgeon: Burnell Blanks, MD;  Location: [facility] CATH LAB;  Service: Cardiovascular;  Laterality: N/A;  Proximal LAD  . TEE WITHOUT CARDIOVERSION N/A 02/15/2013   Procedure: TRANSESOPHAGEAL ECHOCARDIOGRAM (TEE);  Surgeon: Thayer Headings, MD;  Location: [place];  Service: Cardiovascular;  Laterality: N/A;  . TEE WITHOUT CARDIOVERSION N/A 08/24/2017   Procedure: TRANSESOPHAGEAL ECHOCARDIOGRAM (TEE);  Surgeon: Prescott Gum, Collier Salina, MD;  Location: [place];  Service: Open Heart Surgery;  Laterality: N/A;  . TEE WITHOUT CARDIOVERSION N/A 08/31/2017   Procedure: TRANSESOPHAGEAL  ECHOCARDIOGRAM (TEE);  Surgeon: Dorothy Spark, MD;  Location: [facility] ENDOSCOPY;  Service: Cardiovascular;  Laterality: N/A;     Current Outpatient Medications  Medication Sig Dispense Refill  . acetaminophen (TYLENOL) 650 MG CR tablet Take 650 mg by mouth every 8 (eight) hours as needed for pain.    Marland Kitchen albuterol (PROVENTIL HFA;VENTOLIN HFA) 108 (90 Base) MCG/ACT inhaler Inhale 1-2 puffs into the lungs every 4 (four) hours as needed for wheezing or shortness of breath.     Marland Kitchen amiodarone (PACERONE) 200 MG tablet Take 1 tablet (200 mg total) by mouth 2 (two) times daily. 60 tablet 3  . aspirin EC 81 MG tablet Take 81 mg by mouth daily.    Marland Kitchen atorvastatin (LIPITOR) 80 MG tablet Take 80 mg by mouth daily.    . blood glucose meter kit and supplies KIT Dispense based on patient and insurance preference. Use up to four times daily as directed. (FOR ICD-9 250.00, 250.01). 1 each 0  . diltiazem (CARDIZEM SR) 120 MG 12 hr capsule Take 1 capsule (120 mg total) by mouth every 12 (twelve) hours. 60 capsule 3  . docusate sodium (COLACE) 100 MG capsule Take 100 mg by mouth 2 (two) times daily.  5  . DULoxetine (CYMBALTA) 60 MG capsule Take 60 mg by mouth every evening.    . furosemide (LASIX) 40  MG tablet Take 1 tablet (40 mg total) by mouth daily. 30 tablet 0  . JARDIANCE 10 MG TABS tablet Take 10 mg by mouth daily.  3  . magnesium oxide (MAG-OX) 400 (241.3 MG) MG tablet Take 1 tablet (400 mg total) by mouth daily. 30 tablet 11  . Metoprolol Tartrate 37.5 MG TABS Take 37.5 mg by mouth 2 (two) times daily.    . Multiple Vitamin (MULTIVITAMIN WITH MINERALS) TABS tablet Take 1 tablet by mouth daily.     . nitroGLYCERIN (NITROSTAT) 0.4 MG SL tablet PLACE 1 TABLET UNDER TONGUE EVERY 5 MINUTES AS NEEDED FOR CHEST PAIN MAX 3 TABLETS (Patient taking differently: Place 0.4 mg under the tongue every 5 (five) minutes as needed for chest pain. ) 25 tablet 3  . NON FORMULARY at bedtime. CPAP    . nystatin  (MYCOSTATIN/NYSTOP) powder Apply 1 application topically 2 (two) times daily as needed for rash.  2  . oxyCODONE 10 MG TABS Take 1 tablet (10 mg total) by mouth every 4 (four) hours as needed for severe pain. 30 tablet 0  . pantoprazole (PROTONIX) 40 MG tablet Take 40 mg by mouth daily.    . potassium chloride (K-DUR,KLOR-CON) 10 MEQ tablet TAKE 1 TABLET BY MOUTH DAILY (Patient taking differently: Take 10 mEq by mouth daily. ) 30 tablet 0  . rivaroxaban (XARELTO) 20 MG TABS tablet Take 1 tablet (20 mg total) daily with supper by mouth. 90 tablet 3  . traMADol (ULTRAM) 50 MG tablet Take 1 tablet (50 mg total) by mouth 2 (two) times daily as needed (pain). 60 tablet 0   No current facility-administered medications for this visit.     Allergies:   Patient has no known allergies.    Social History:  The patient  reports that he quit smoking about 4 years ago. His smoking use included cigarettes. He has a 17.50 pack-year smoking history. He has never used smokeless tobacco. He reports that he does not drink alcohol or use drugs.   Family History:  The patient's family history includes Diabetes in his father and mother; Heart disease in his mother; Stroke in his mother.    ROS: All other systems are reviewed and negative. Unless otherwise mentioned in H&P    PHYSICAL EXAM: VS:  BP 120/70   Pulse 80   Ht _0  (1.702 m)   Wt 247 lb 12.8 oz (112.4 kg)   BMI 38.81 kg/m  , BMI Body mass index is 38.81 kg/m. GEN: Well nourished, well developed, in no acute distress  HEENT: normal  Neck: no JVD, carotid bruits, or masses Cardiac:IRRR; no murmurs, rubs, or gallops,no edema  Respiratory:  clear to auscultation bilaterally, normal work of breathing GI: soft, nontender, nondistended, + BS MS: no deformity or atrophy Well healed sternotomy incision and left leg endoscopic harvest site.  Skin: warm and dry, no rash Neuro:  Strength and sensation are intact Psych: euthymic mood, full  affect   EKG: Atrial flutter with variable block/coarse atrial fib. Rate of 80 bpm.   Recent Labs: 08/22/2017: ALT 41; TSH 0.819 08/29/2017: Hemoglobin 9.9; Platelets 201 09/01/2017: BUN 14; Creatinine, Ser 0.86; Potassium 4.1; Sodium 139 09/02/2017: Magnesium 2.0    Lipid Panel    Component Value Date/Time   CHOL 88 08/29/2017 0413   CHOL 195 01/28/2013 0939   TRIG 118 08/29/2017 0413   TRIG 206 (H) 01/28/2013 0939   HDL 23 (L) 08/29/2017 0413   HDL 43 01/28/2013 0939  CHOLHDL 3.8 08/29/2017 0413   VLDL 24 08/29/2017 0413   LDLCALC 41 08/29/2017 0413   LDLCALC 111 (H) 01/28/2013 0939      Wt Readings from Last 3 Encounters:  09/15/17 247 lb 12.8 oz (112.4 kg)  09/11/17 252 lb (114.3 kg)  09/02/17 257 lb 14.4 oz (117 kg)      Other studies Reviewed: Conclusion     The left ventricular ejection fraction is 55-65% by visual estimate.  LV end diastolic pressure is normal.  The left ventricular systolic function is normal.  Previously placed Prox RCA to Mid RCA stent (unknown type) is widely patent.  Dist LM lesion is 75% stenosed.  Ost LAD lesion is 75% stenosed.  Previously placed Prox LAD stent (unknown type) is widely patent.   1. Severe distal left main and ostial LAD stenosis confirmed by positive FFR analysis (0.76) 2. Continued patency of the stented segments in the LAD and RCA 3. Patent left circumflex 4. Normal LV systolic function   CABG  1. Coronary artery bypass grafting x3 (left internal mammary artery to left anterior descending, saphenous vein graft to ramus intermedius, saphenous vein graft to obtuse marginal). 2. Endoscopic harvest of right leg greater saphenous vein.  ASSESSMENT AND PLAN:  1. CAD: S/P CABG X 3 on 08/24/2017, hx of DES to the RCA in the past. He has pulled and twisted his right side after his dog jumped on him, almost causing him to fall. He has significant recurrence of pain not controlled with  Tylenol. I will start  Tramadol 50 mg BID or TID prn for pain control. He will follow up with CVTS as scheduled. Cardiac rehab is recommended.   2. Atrial fib: Continues on Xarelto,amiodarone, and diltiazem. He is followed in the Afib Clinic by Paul Palau NP. He will follow up with her and be considered for DCCV. Continue CPAP to increase protection for recurrence of atrial fib.   3. Diabetes: Followed by PCP   Current medicines are reviewed at length with the patient today.    Labs/ tests ordered today include: None  Phill Myron. West Pugh, ANP, AACC   09/15/2017 1:23 PM     [facility] [address] [place], [place] [address] Phone: [phone]; Fax: [phone]

## 2017-09-15 ENCOUNTER — Encounter: Payer: Self-pay | Admitting: Adult Health

## 2017-09-15 ENCOUNTER — Ambulatory Visit (INDEPENDENT_AMBULATORY_CARE_PROVIDER_SITE_OTHER): Payer: BLUE CROSS/BLUE SHIELD | Admitting: Adult Health

## 2017-09-15 VITALS — BP 120/70 | HR 80 | Ht 67.0 in | Wt 247.8 lb

## 2017-09-15 DIAGNOSIS — I482 Chronic atrial fibrillation, unspecified: Secondary | ICD-10-CM

## 2017-09-15 DIAGNOSIS — E7800 Pure hypercholesterolemia, unspecified: Secondary | ICD-10-CM | POA: Diagnosis not present

## 2017-09-15 DIAGNOSIS — Z951 Presence of aortocoronary bypass graft: Secondary | ICD-10-CM | POA: Diagnosis not present

## 2017-09-15 DIAGNOSIS — I2510 Atherosclerotic heart disease of native coronary artery without angina pectoris: Secondary | ICD-10-CM | POA: Diagnosis not present

## 2017-09-15 DIAGNOSIS — Z9861 Coronary angioplasty status: Secondary | ICD-10-CM

## 2017-09-15 MED ORDER — TRAMADOL HCL 50 MG PO TABS: 50.0000 mg | ORAL_TABLET | Freq: Two times a day (BID) | ORAL | 0 refills | Status: DC | PRN

## 2017-09-15 NOTE — Patient Instructions (Signed)
Curt Bears NP has prescribed tramadol to use as needed for pain.   Curt Bears NP recommends that you schedule a follow-up appointment in [place] with [name].  Please keep all other scheduled appointments.

## 2017-09-16 ENCOUNTER — Telehealth (HOSPITAL_COMMUNITY): Payer: Self-pay

## 2017-09-16 NOTE — Telephone Encounter (Signed)
Called patient to see if he was interested in participating in the Cardiac Rehab Program. Patient stated yes. Patient will come in for orientation on 11/10/17 @ 8:15AM and will attend the 9:45AM exercise class.  Mailed homework package.  Went over insurance, patient verbalized understanding.

## 2017-09-17 ENCOUNTER — Other Ambulatory Visit: Payer: Self-pay | Admitting: Physician Assistant

## 2017-09-17 NOTE — Telephone Encounter (Signed)
Send refills to PCP

## 2017-09-23 DIAGNOSIS — H524 Presbyopia: Secondary | ICD-10-CM | POA: Diagnosis not present

## 2017-09-23 DIAGNOSIS — H2513 Age-related nuclear cataract, bilateral: Secondary | ICD-10-CM | POA: Diagnosis not present

## 2017-09-23 DIAGNOSIS — H5203 Hypermetropia, bilateral: Secondary | ICD-10-CM | POA: Diagnosis not present

## 2017-09-23 DIAGNOSIS — H52223 Regular astigmatism, bilateral: Secondary | ICD-10-CM | POA: Diagnosis not present

## 2017-09-24 ENCOUNTER — Other Ambulatory Visit: Payer: Self-pay | Admitting: Physician Assistant

## 2017-09-25 ENCOUNTER — Encounter (HOSPITAL_COMMUNITY): Payer: Self-pay | Admitting: Nurse Practitioner

## 2017-09-25 ENCOUNTER — Ambulatory Visit (HOSPITAL_COMMUNITY)
Admission: RE | Admit: 2017-09-25 | Discharge: 2017-09-25 | Disposition: A | Discharge status: Home / Self Care | Payer: BLUE CROSS/BLUE SHIELD | Source: Ambulatory Visit | Attending: Nurse Practitioner | Admitting: Nurse Practitioner

## 2017-09-25 VITALS — BP 110/64 | HR 76 | Ht 67.0 in | Wt 248.4 lb

## 2017-09-25 DIAGNOSIS — Z86711 Personal history of pulmonary embolism: Secondary | ICD-10-CM | POA: Insufficient documentation

## 2017-09-25 DIAGNOSIS — I484 Atypical atrial flutter: Secondary | ICD-10-CM | POA: Diagnosis not present

## 2017-09-25 DIAGNOSIS — Z823 Family history of stroke: Secondary | ICD-10-CM | POA: Diagnosis not present

## 2017-09-25 DIAGNOSIS — E785 Hyperlipidemia, unspecified: Secondary | ICD-10-CM | POA: Insufficient documentation

## 2017-09-25 DIAGNOSIS — Z955 Presence of coronary angioplasty implant and graft: Secondary | ICD-10-CM | POA: Diagnosis not present

## 2017-09-25 DIAGNOSIS — I4892 Unspecified atrial flutter: Secondary | ICD-10-CM | POA: Insufficient documentation

## 2017-09-25 DIAGNOSIS — I481 Persistent atrial fibrillation: Secondary | ICD-10-CM | POA: Diagnosis not present

## 2017-09-25 DIAGNOSIS — Z7901 Long term (current) use of anticoagulants: Secondary | ICD-10-CM | POA: Diagnosis not present

## 2017-09-25 DIAGNOSIS — E119 Type 2 diabetes mellitus without complications: Secondary | ICD-10-CM | POA: Diagnosis not present

## 2017-09-25 DIAGNOSIS — Z7982 Long term (current) use of aspirin: Secondary | ICD-10-CM | POA: Insufficient documentation

## 2017-09-25 DIAGNOSIS — Z79899 Other long term (current) drug therapy: Secondary | ICD-10-CM | POA: Insufficient documentation

## 2017-09-25 DIAGNOSIS — G4733 Obstructive sleep apnea (adult) (pediatric): Secondary | ICD-10-CM | POA: Diagnosis not present

## 2017-09-25 DIAGNOSIS — I252 Old myocardial infarction: Secondary | ICD-10-CM | POA: Diagnosis not present

## 2017-09-25 DIAGNOSIS — Z87891 Personal history of nicotine dependence: Secondary | ICD-10-CM | POA: Insufficient documentation

## 2017-09-25 DIAGNOSIS — M1990 Unspecified osteoarthritis, unspecified site: Secondary | ICD-10-CM | POA: Insufficient documentation

## 2017-09-25 DIAGNOSIS — Z9989 Dependence on other enabling machines and devices: Secondary | ICD-10-CM | POA: Insufficient documentation

## 2017-09-25 DIAGNOSIS — I2510 Atherosclerotic heart disease of native coronary artery without angina pectoris: Secondary | ICD-10-CM | POA: Diagnosis not present

## 2017-09-25 DIAGNOSIS — Z8249 Family history of ischemic heart disease and other diseases of the circulatory system: Secondary | ICD-10-CM | POA: Insufficient documentation

## 2017-09-25 DIAGNOSIS — Z951 Presence of aortocoronary bypass graft: Secondary | ICD-10-CM | POA: Diagnosis not present

## 2017-09-25 DIAGNOSIS — Z9889 Other specified postprocedural states: Secondary | ICD-10-CM | POA: Insufficient documentation

## 2017-09-25 DIAGNOSIS — Z833 Family history of diabetes mellitus: Secondary | ICD-10-CM | POA: Diagnosis not present

## 2017-09-25 DIAGNOSIS — I10 Essential (primary) hypertension: Secondary | ICD-10-CM | POA: Diagnosis not present

## 2017-09-25 LAB — BASIC METABOLIC PANEL
Anion gap: 12 (ref 5–15)
BUN: 13 mg/dL (ref 6–20)
CO2: 22 mmol/L (ref 22–32)
Calcium: 9.4 mg/dL (ref 8.9–10.3)
Chloride: 107 mmol/L (ref 98–111)
Creatinine, Ser: 0.83 mg/dL (ref 0.61–1.24)
GFR calc Af Amer: >60 mL/min (ref >60)
GFR calc non Af Amer: >60 mL/min (ref >60)
GLUCOSE: 105 mg/dL — AB (ref 70–99)
Potassium: 4 mmol/L (ref 3.5–5.1)
Sodium: 141 mmol/L (ref 135–145)

## 2017-09-25 LAB — CBC
HEMATOCRIT: 36.4 % — AB (ref 39.0–52.0)
Hemoglobin: 11.3 g/dL — ABNORMAL LOW (ref 13.0–17.0)
MCH: 27.8 pg (ref 26.0–34.0)
MCHC: 31 g/dL (ref 30.0–36.0)
MCV: 89.7 fL (ref 78.0–100.0)
PLATELETS: 276 10*3/uL (ref 150–400)
RBC: 4.06 MIL/uL — ABNORMAL LOW (ref 4.22–5.81)
RDW: 14.3 % (ref 11.5–15.5)
WBC: 9.6 10*3/uL (ref 4.0–10.5)

## 2017-09-25 NOTE — Patient Instructions (Signed)
Cardioversion scheduled for Tuesday, September 24th  - Arrive at the [facility] and go to admitting at [place] not eat or drink anything after midnight the night prior to your procedure.  - Take all your morning medication with a sip of water prior to arrival.  - You will not be able to drive home after your procedure.

## 2017-09-28 NOTE — H&P (View-Only) (Signed)
Primary Care Physician: Paul Beals, NP Referring Physician: [facility] f/u  Cardiologist: Dr. Otis Todd is a 57 y.o. male with a h/o paroxysmal afib, prior use of tikosyn, CAD, PVD,DM, HTN, Prior PE, that had a recent hospitalization for chest pain and underwent LHC and went on to have CABG x 3. He unfortunately went into rapid afib/flutter. He underwent cardioversion, resumed SR x one hour and then returned to atrial flutter. It was decided to stop Tikosyn and load on amiodarone. He was d/c in atrial flutter.  In the afib clinic, he feels improved. Not aware of heart rhythm. He is in rate controlled aflutter at 58 bpm. His weight is down 3 lbs from last weight. Sternotomy is healing well without drainage.  F/u in afib clinic, 9/20. He remains in atrial flutter. He has now been loading on amiodarone for a month and will plan on cardioversion.He is rate controlled but fatigued being out of rhythm.  Today, he denies symptoms of palpitations, chest pain, shortness of breath, orthopnea, PND, lower extremity edema, dizziness, presyncope, syncope, or neurologic sequela. The patient is tolerating medications without difficulties and is otherwise without complaint today.   Past Medical History:  Diagnosis Date  . Arthritis       . CAD (coronary artery disease)    multivessel per cath 02/12/13, s/p stenting to RCA  . Chronic lower back pain   . Diabetes mellitus without complication ([place])   . Dyslipidemia   . History of kidney stones   . Hx of Bell's palsy   . Hypertension   . Myocardial infarction ([place]) 02/12/2013   . Nephrolithiasis   . Neuromuscular disorder (H[facility])    Bell's Palsy  . OSA on CPAP   . PE (pulmonary thromboembolism) ([place]) 02/2014  . Persistent atrial fibrillation [facility])        Past Surgical History:  Procedure Laterality Date  . ABDOMINAL AORTOGRAM W/LOWER EXTREMITY N/A 01/26/2017   Procedure: ABDOMINAL AORTOGRAM W/LOWER EXTREMITY;  Surgeon: Angelia Mould, MD;  Location: [place] [facility];  Service: Cardiovascular;  Laterality: N/A;  . BACK SURGERY  2017   laminectomy  . CARDIOVERSION N/A 02/15/2013   Procedure: CARDIOVERSION;  Surgeon: Thayer Headings, MD;  Location: [place];  Service: Cardiovascular;  Laterality: N/A;  . CARDIOVERSION N/A 07/07/2014   Procedure: CARDIOVERSION;  Surgeon: Pixie Casino, MD;  Location: [facility] ENDOSCOPY;  Service: Cardiovascular;  Laterality: N/A;  . CARDIOVERSION N/A 08/31/2017   Procedure: CARDIOVERSION;  Surgeon: Dorothy Spark, MD;  Location: [place];  Service: Cardiovascular;  Laterality: N/A;  . CORONARY ANGIOPLASTY WITH STENT PLACEMENT  02/12/2013  . CORONARY ARTERY BYPASS GRAFT N/A 08/24/2017   Procedure: CORONARY ARTERY BYPASS GRAFTING (CABG) X3. RIGHT ENDOSCOPIC SAPHENOUS VEIN HARVEST. MAMM TO LAD, SVG TO OM, SVG TO RAMUS;  Surgeon: Ivin Poot, MD;  Location: [place];  Service: Open Heart Surgery;  Laterality: N/A;  . CYSTOSCOPY WITH RETROGRADE PYELOGRAM, URETEROSCOPY AND STENT PLACEMENT Left 09/02/2013   Procedure: CYSTOSCOPY WITH RETROGRADE PYELOGRAM/LEFT URETEROSCOPY/URETERAL STENT, with fulguration;  Surgeon: Festus Aloe, MD;  Location: [facility];  Service: Urology;  Laterality: Left;  . CYSTOSCOPY WITH URETEROSCOPY AND STENT PLACEMENT Left 09/16/2013   Procedure: CYSTOSCOPY WITH URETEROSCOPY AND STENT PLACEMENT;  Surgeon: Festus Aloe, MD;  Location: [facility];  Service: Urology;  Laterality: Left;  . FEMORAL BYPASS Left 04/07/2017   below the knee popliteal  . FEMORAL-POPLITEAL BYPASS GRAFT Left 04/07/2017   Procedure: LEFT FEMORAL-BELOW KNEE POPLITEAL ARTERY  BYPASS USING COMPOSITE LEFT GREATER SAPHENOUS VEIN;  Surgeon: Angelia Mould, MD;  Location: [place];  Service: Vascular;  Laterality: Left;  . HOLMIUM LASER APPLICATION Left 7/85/8850   Procedure: HOLMIUM LASER APPLICATION;  Surgeon: Festus Aloe, MD;  Location: [facility];  Service: Urology;  Laterality: Left;  .  INTRAVASCULAR PRESSURE WIRE/FFR STUDY N/A 08/21/2017   Procedure: INTRAVASCULAR PRESSURE WIRE/FFR STUDY;  Surgeon: Sherren Mocha, MD;  Location: [place] [facility];  Service: Cardiovascular;  Laterality: N/A;  . LEFT HEART CATH AND CORONARY ANGIOGRAPHY N/A 08/21/2017   Procedure: LEFT HEART CATH AND CORONARY ANGIOGRAPHY;  Surgeon: Sherren Mocha, MD;  Location: [place] [facility];  Service: Cardiovascular;  Laterality: N/A;  . LEFT HEART CATHETERIZATION WITH CORONARY ANGIOGRAM N/A 02/12/2013   Procedure: LEFT HEART CATHETERIZATION WITH CORONARY ANGIOGRAM;  Surgeon: Troy Sine, MD;  Location: [facility] CATH LAB;  Service: Cardiovascular;  Laterality: N/A;  . PERCUTANEOUS CORONARY STENT INTERVENTION (PCI-S)  02/12/2013   Procedure: PERCUTANEOUS CORONARY STENT INTERVENTION (PCI-S);  Surgeon: Troy Sine, MD;  Location: [facility] CATH LAB;  Service: Cardiovascular;;  STEMI  . PERCUTANEOUS CORONARY STENT INTERVENTION (PCI-S) N/A 02/14/2013   Procedure: PERCUTANEOUS CORONARY STENT INTERVENTION (PCI-S);  Surgeon: Burnell Blanks, MD;  Location: [facility] CATH LAB;  Service: Cardiovascular;  Laterality: N/A;  Proximal LAD  . TEE WITHOUT CARDIOVERSION N/A 02/15/2013   Procedure: TRANSESOPHAGEAL ECHOCARDIOGRAM (TEE);  Surgeon: Thayer Headings, MD;  Location: [place];  Service: Cardiovascular;  Laterality: N/A;  . TEE WITHOUT CARDIOVERSION N/A 08/24/2017   Procedure: TRANSESOPHAGEAL ECHOCARDIOGRAM (TEE);  Surgeon: Prescott Gum, Collier Salina, MD;  Location: [place];  Service: Open Heart Surgery;  Laterality: N/A;  . TEE WITHOUT CARDIOVERSION N/A 08/31/2017   Procedure: TRANSESOPHAGEAL ECHOCARDIOGRAM (TEE);  Surgeon: Dorothy Spark, MD;  Location: [facility] ENDOSCOPY;  Service: Cardiovascular;  Laterality: N/A;    Current Outpatient Medications  Medication Sig Dispense Refill  . ACCU-CHEK AVIVA PLUS test strip USE UP TO 4 TIMES DAILY AS DIRECTED 120 each 0  . acetaminophen (TYLENOL) 650 MG CR tablet Take 650 mg by mouth every 8  (eight) hours as needed for pain.    Marland Kitchen albuterol (PROVENTIL HFA;VENTOLIN HFA) 108 (90 Base) MCG/ACT inhaler Inhale 2 puffs into the lungs every 4 (four) hours as needed for wheezing or shortness of breath.     Marland Kitchen amiodarone (PACERONE) 200 MG tablet Take 1 tablet (200 mg total) by mouth 2 (two) times daily. 60 tablet 3  . aspirin EC 81 MG tablet Take 81 mg by mouth daily.    Marland Kitchen atorvastatin (LIPITOR) 80 MG tablet Take 80 mg by mouth daily.    . blood glucose meter kit and supplies KIT Dispense based on patient and insurance preference. Use up to four times daily as directed. (FOR ICD-9 250.00, 250.01). 1 each 0  . diltiazem (CARDIZEM SR) 120 MG 12 hr capsule Take 1 capsule (120 mg total) by mouth every 12 (twelve) hours. 60 capsule 3  . docusate sodium (COLACE) 100 MG capsule Take 100 mg by mouth daily.   5  . DULoxetine (CYMBALTA) 60 MG capsule Take 60 mg by mouth every evening.    . furosemide (LASIX) 40 MG tablet Take 1 tablet (40 mg total) by mouth daily. 30 tablet 0  . JARDIANCE 10 MG TABS tablet Take 10 mg by mouth daily.  3  . magnesium oxide (MAG-OX) 400 (241.3 MG) MG tablet Take 1 tablet (400 mg total) by mouth daily. (Patient taking differently: Take 250 mg  by mouth daily. ) 30 tablet 11  . Metoprolol Tartrate 37.5 MG TABS Take 37.5 mg by mouth 2 (two) times daily.    . Multiple Vitamin (MULTIVITAMIN WITH MINERALS) TABS tablet Take 1 tablet by mouth daily.     . nitroGLYCERIN (NITROSTAT) 0.4 MG SL tablet PLACE 1 TABLET UNDER TONGUE EVERY 5 MINUTES AS NEEDED FOR CHEST PAIN MAX 3 TABLETS (Patient taking differently: Place 0.4 mg under the tongue every 5 (five) minutes as needed for chest pain. ) 25 tablet 3  . NON FORMULARY at bedtime. CPAP    . oxyCODONE 10 MG TABS Take 1 tablet (10 mg total) by mouth every 4 (four) hours as needed for severe pain. (Patient not taking: Reported on 09/25/2017) 30 tablet 0  . pantoprazole (PROTONIX) 40 MG tablet Take 40 mg by mouth daily.    . potassium chloride  (K-DUR,KLOR-CON) 10 MEQ tablet TAKE 1 TABLET BY MOUTH DAILY (Patient not taking: No sig reported) 30 tablet 0  . rivaroxaban (XARELTO) 20 MG TABS tablet Take 1 tablet (20 mg total) daily with supper by mouth. 90 tablet 3  . traMADol (ULTRAM) 50 MG tablet Take 1 tablet (50 mg total) by mouth 2 (two) times daily as needed (pain). 60 tablet 0  . POTASSIUM PO Take 1 tablet by mouth daily.     No current facility-administered medications for this encounter.     No Known Allergies  Social History   Socioeconomic History  . Marital status: Married    Spouse name: Not on file  . Number of children: Not on file  . Years of education: Not on file  . Highest education level: Not on file  Occupational History  . Not on file  Social Needs  . Financial resource strain: Not on file  . Food insecurity:    Worry: Not on file    Inability: Not on file  . Transportation needs:    Medical: Not on file    Non-medical: Not on file  Tobacco Use  . Smoking status: Former Smoker    Packs/day: 0.50    Years: 35.00    Pack years: 17.50    Types: Cigarettes    Last attempt to quit: 02/12/2013    Years since quitting: 4.6  . Smokeless tobacco: Never Used  Substance and Sexual Activity  . Alcohol use: No    Comment: 07/21/2014 "never drank much; might have a couple drinks/yr since MI 02/2013"  . Drug use: No  . Sexual activity: Yes    Birth control/protection: None  Lifestyle  . Physical activity:    Days per week: Not on file    Minutes per session: Not on file  . Stress: Not on file  Relationships  . Social connections:    Talks on phone: Not on file    Gets together: Not on file    Attends religious service: Not on file    Active member of club or organization: Not on file    Attends meetings of clubs or organizations: Not on file    Relationship status: Not on file  . Intimate partner violence:    Fear of current or ex partner: Not on file    Emotionally abused: Not on file    Physically  abused: Not on file    Forced sexual activity: Not on file  Other Topics Concern  . Not on file  Social History Narrative  . Not on file    Family History  Problem Relation Age  of Onset  . Heart disease Mother   . Diabetes Mother   . Stroke Mother   . Diabetes Father     ROS- All systems are reviewed and negative except as per the HPI above  Physical Exam: Vitals:   09/25/17 1019  BP: 110/64  Pulse: 76  Weight: 112.7 kg  Height: '5\' 7"'$  (1.702 m)   Wt Readings from Last 3 Encounters:  09/25/17 112.7 kg  09/15/17 112.4 kg  09/11/17 114.3 kg    Labs: Lab Results  Component Value Date   NA 141 09/25/2017   K 4.0 09/25/2017   CL 107 09/25/2017   CO2 22 09/25/2017   GLUCOSE 105 (H) 09/25/2017   BUN 13 09/25/2017   CREATININE 0.83 09/25/2017   CALCIUM 9.4 09/25/2017   MG 2.0 09/02/2017   Lab Results  Component Value Date   INR 1.27 08/24/2017   Lab Results  Component Value Date   CHOL 88 08/29/2017   HDL 23 (L) 08/29/2017   LDLCALC 41 08/29/2017   TRIG 118 08/29/2017     GEN- The patient is well appearing, alert and oriented x 3 today.   Head- normocephalic, atraumatic Eyes-  Sclera clear, conjunctiva pink Ears- hearing intact Oropharynx- clear Neck- supple, no JVP Lymph- no cervical lymphadenopathy Lungs- Clear to ausculation bilaterally, normal work of breathing Heart- Regular rate and rhythm, no murmurs, rubs or gallops, PMI not laterally displaced. Sternotomy site healing well. GI- soft, NT, ND, + BS Extremities- no clubbing, cyanosis, or edema. Vein graft site healing well, rt leg. MS- no significant deformity or atrophy Skin- no rash or lesion Psych- euthymic mood, full affect Neuro- strength and sensation are intact  EKG- Atrial flutter at 58 bpm, 4:1 AV conduction, qrs int 100 ms, qtc 410 ms( stable) Epic records reviewed    Assessment and Plan: 1.  Symptomatic Atrial fib/flutter Pt is loading on amiodarone 200 mg bid,started 8/18 He  is rate controlled He is taking xarelto 20 mg daily, states no missed doses Will plan for cardioversion 9/24. He will lower amio to once daily on f/u one week after cardioversion Bmet/cbc today  2. CABG x 3 Appears to be recovering well from surgery  3. HTN Stable  F/u one week after cardioversion,10/3 F/u with Dr. Prescott Gum 9/25 as scheduled  Geroge Baseman. [place], [facility] [address] [place], [place] [address] [phone]

## 2017-09-28 NOTE — Progress Notes (Signed)
Primary Care Physician: Everardo Beals, NP Referring Physician: [facility] f/u  Cardiologist: Paul Todd is a 57 y.o. male with a h/o paroxysmal afib, prior use of tikosyn, CAD, PVD,DM, HTN, Prior PE, that had a recent hospitalization for chest pain and underwent LHC and went on to have CABG x 3. Paul Todd unfortunately went into rapid afib/flutter. Paul Todd underwent cardioversion, resumed SR x one hour and then returned to atrial flutter. It was decided to stop Tikosyn and load on amiodarone. Paul Todd was d/c in atrial flutter.  In the afib clinic, Paul Todd feels improved. Not aware of heart rhythm. Paul Todd is in rate controlled aflutter at 58 bpm. Paul Todd weight is down 3 lbs from last weight. Sternotomy is healing well without drainage.  F/u in afib clinic, 9/20. Paul Todd remains in atrial flutter. Paul Todd has now been loading on amiodarone for a month and will plan on cardioversion.Paul Todd is rate controlled but fatigued being out of rhythm.  Today, Paul Todd denies symptoms of palpitations, chest pain, shortness of breath, orthopnea, PND, lower extremity edema, dizziness, presyncope, syncope, or neurologic sequela. The patient is tolerating medications without difficulties and is otherwise without complaint today.   Past Medical History:  Diagnosis Date  . Arthritis       . CAD (coronary artery disease)    multivessel per cath 02/12/13, s/p stenting to RCA  . Chronic lower back pain   . Diabetes mellitus without complication ([place])   . Dyslipidemia   . History of kidney stones   . Hx of Bell's palsy   . Hypertension   . Myocardial infarction ([place]) 02/12/2013   . Nephrolithiasis   . Neuromuscular disorder (HCC)    Bell's Palsy  . OSA on CPAP   . PE (pulmonary thromboembolism) ([place]) 02/2014  . Persistent atrial fibrillation [facility])        Past Surgical History:  Procedure Laterality Date  . ABDOMINAL AORTOGRAM W/LOWER EXTREMITY N/A 01/26/2017   Procedure: ABDOMINAL AORTOGRAM W/LOWER EXTREMITY;  Surgeon: Angelia Mould, MD;  Location: [place] [facility];  Service: Cardiovascular;  Laterality: N/A;  . BACK SURGERY  2017   laminectomy  . CARDIOVERSION N/A 02/15/2013   Procedure: CARDIOVERSION;  Surgeon: Thayer Headings, MD;  Location: [place];  Service: Cardiovascular;  Laterality: N/A;  . CARDIOVERSION N/A 07/07/2014   Procedure: CARDIOVERSION;  Surgeon: Pixie Casino, MD;  Location: [facility] ENDOSCOPY;  Service: Cardiovascular;  Laterality: N/A;  . CARDIOVERSION N/A 08/31/2017   Procedure: CARDIOVERSION;  Surgeon: Dorothy Spark, MD;  Location: [place];  Service: Cardiovascular;  Laterality: N/A;  . CORONARY ANGIOPLASTY WITH STENT PLACEMENT  02/12/2013  . CORONARY ARTERY BYPASS GRAFT N/A 08/24/2017   Procedure: CORONARY ARTERY BYPASS GRAFTING (CABG) X3. RIGHT ENDOSCOPIC SAPHENOUS VEIN HARVEST. MAMM TO LAD, SVG TO OM, SVG TO RAMUS;  Surgeon: Ivin Poot, MD;  Location: [place];  Service: Open Heart Surgery;  Laterality: N/A;  . CYSTOSCOPY WITH RETROGRADE PYELOGRAM, URETEROSCOPY AND STENT PLACEMENT Left 09/02/2013   Procedure: CYSTOSCOPY WITH RETROGRADE PYELOGRAM/LEFT URETEROSCOPY/URETERAL STENT, with fulguration;  Surgeon: Festus Aloe, MD;  Location: [facility];  Service: Urology;  Laterality: Left;  . CYSTOSCOPY WITH URETEROSCOPY AND STENT PLACEMENT Left 09/16/2013   Procedure: CYSTOSCOPY WITH URETEROSCOPY AND STENT PLACEMENT;  Surgeon: Festus Aloe, MD;  Location: [facility];  Service: Urology;  Laterality: Left;  . FEMORAL BYPASS Left 04/07/2017   below the knee popliteal  . FEMORAL-POPLITEAL BYPASS GRAFT Left 04/07/2017   Procedure: LEFT FEMORAL-BELOW KNEE POPLITEAL ARTERY  BYPASS USING COMPOSITE LEFT GREATER SAPHENOUS VEIN;  Surgeon: Angelia Mould, MD;  Location: [place];  Service: Vascular;  Laterality: Left;  . HOLMIUM LASER APPLICATION Left 7/82/9562   Procedure: HOLMIUM LASER APPLICATION;  Surgeon: Festus Aloe, MD;  Location: [facility];  Service: Urology;  Laterality: Left;  .  INTRAVASCULAR PRESSURE WIRE/FFR STUDY N/A 08/21/2017   Procedure: INTRAVASCULAR PRESSURE WIRE/FFR STUDY;  Surgeon: Sherren Mocha, MD;  Location: [place] [facility];  Service: Cardiovascular;  Laterality: N/A;  . LEFT HEART CATH AND CORONARY ANGIOGRAPHY N/A 08/21/2017   Procedure: LEFT HEART CATH AND CORONARY ANGIOGRAPHY;  Surgeon: Sherren Mocha, MD;  Location: [place] [facility];  Service: Cardiovascular;  Laterality: N/A;  . LEFT HEART CATHETERIZATION WITH CORONARY ANGIOGRAM N/A 02/12/2013   Procedure: LEFT HEART CATHETERIZATION WITH CORONARY ANGIOGRAM;  Surgeon: Troy Sine, MD;  Location: [facility] CATH LAB;  Service: Cardiovascular;  Laterality: N/A;  . PERCUTANEOUS CORONARY STENT INTERVENTION (PCI-S)  02/12/2013   Procedure: PERCUTANEOUS CORONARY STENT INTERVENTION (PCI-S);  Surgeon: Troy Sine, MD;  Location: [facility] CATH LAB;  Service: Cardiovascular;;  STEMI  . PERCUTANEOUS CORONARY STENT INTERVENTION (PCI-S) N/A 02/14/2013   Procedure: PERCUTANEOUS CORONARY STENT INTERVENTION (PCI-S);  Surgeon: Burnell Blanks, MD;  Location: [facility] CATH LAB;  Service: Cardiovascular;  Laterality: N/A;  Proximal LAD  . TEE WITHOUT CARDIOVERSION N/A 02/15/2013   Procedure: TRANSESOPHAGEAL ECHOCARDIOGRAM (TEE);  Surgeon: Thayer Headings, MD;  Location: [place];  Service: Cardiovascular;  Laterality: N/A;  . TEE WITHOUT CARDIOVERSION N/A 08/24/2017   Procedure: TRANSESOPHAGEAL ECHOCARDIOGRAM (TEE);  Surgeon: Prescott Gum, Collier Salina, MD;  Location: [place];  Service: Open Heart Surgery;  Laterality: N/A;  . TEE WITHOUT CARDIOVERSION N/A 08/31/2017   Procedure: TRANSESOPHAGEAL ECHOCARDIOGRAM (TEE);  Surgeon: Dorothy Spark, MD;  Location: [facility] ENDOSCOPY;  Service: Cardiovascular;  Laterality: N/A;    Current Outpatient Medications  Medication Sig Dispense Refill  . ACCU-CHEK AVIVA PLUS test strip USE UP TO 4 TIMES DAILY AS DIRECTED 120 each 0  . acetaminophen (TYLENOL) 650 MG CR tablet Take 650 mg by mouth every 8  (eight) hours as needed for pain.    Marland Kitchen albuterol (PROVENTIL HFA;VENTOLIN HFA) 108 (90 Base) MCG/ACT inhaler Inhale 2 puffs into the lungs every 4 (four) hours as needed for wheezing or shortness of breath.     Marland Kitchen amiodarone (PACERONE) 200 MG tablet Take 1 tablet (200 mg total) by mouth 2 (two) times daily. 60 tablet 3  . aspirin EC 81 MG tablet Take 81 mg by mouth daily.    Marland Kitchen atorvastatin (LIPITOR) 80 MG tablet Take 80 mg by mouth daily.    . blood glucose meter kit and supplies KIT Dispense based on patient and insurance preference. Use up to four times daily as directed. (FOR ICD-9 250.00, 250.01). 1 each 0  . diltiazem (CARDIZEM SR) 120 MG 12 hr capsule Take 1 capsule (120 mg total) by mouth every 12 (twelve) hours. 60 capsule 3  . docusate sodium (COLACE) 100 MG capsule Take 100 mg by mouth daily.   5  . DULoxetine (CYMBALTA) 60 MG capsule Take 60 mg by mouth every evening.    . furosemide (LASIX) 40 MG tablet Take 1 tablet (40 mg total) by mouth daily. 30 tablet 0  . JARDIANCE 10 MG TABS tablet Take 10 mg by mouth daily.  3  . magnesium oxide (MAG-OX) 400 (241.3 MG) MG tablet Take 1 tablet (400 mg total) by mouth daily. (Patient taking differently: Take 250 mg  by mouth daily. ) 30 tablet 11  . Metoprolol Tartrate 37.5 MG TABS Take 37.5 mg by mouth 2 (two) times daily.    . Multiple Vitamin (MULTIVITAMIN WITH MINERALS) TABS tablet Take 1 tablet by mouth daily.     . nitroGLYCERIN (NITROSTAT) 0.4 MG SL tablet PLACE 1 TABLET UNDER TONGUE EVERY 5 MINUTES AS NEEDED FOR CHEST PAIN MAX 3 TABLETS (Patient taking differently: Place 0.4 mg under the tongue every 5 (five) minutes as needed for chest pain. ) 25 tablet 3  . NON FORMULARY at bedtime. CPAP    . oxyCODONE 10 MG TABS Take 1 tablet (10 mg total) by mouth every 4 (four) hours as needed for severe pain. (Patient not taking: Reported on 09/25/2017) 30 tablet 0  . pantoprazole (PROTONIX) 40 MG tablet Take 40 mg by mouth daily.    . potassium chloride  (K-DUR,KLOR-CON) 10 MEQ tablet TAKE 1 TABLET BY MOUTH DAILY (Patient not taking: No sig reported) 30 tablet 0  . rivaroxaban (XARELTO) 20 MG TABS tablet Take 1 tablet (20 mg total) daily with supper by mouth. 90 tablet 3  . traMADol (ULTRAM) 50 MG tablet Take 1 tablet (50 mg total) by mouth 2 (two) times daily as needed (pain). 60 tablet 0  . POTASSIUM PO Take 1 tablet by mouth daily.     No current facility-administered medications for this encounter.     No Known Allergies  Social History   Socioeconomic History  . Marital status: Married    Spouse name: Not on file  . Number of children: Not on file  . Years of education: Not on file  . Highest education level: Not on file  Occupational History  . Not on file  Social Needs  . Financial resource strain: Not on file  . Food insecurity:    Worry: Not on file    Inability: Not on file  . Transportation needs:    Medical: Not on file    Non-medical: Not on file  Tobacco Use  . Smoking status: Former Smoker    Packs/day: 0.50    Years: 35.00    Pack years: 17.50    Types: Cigarettes    Last attempt to quit: 02/12/2013    Years since quitting: 4.6  . Smokeless tobacco: Never Used  Substance and Sexual Activity  . Alcohol use: No    Comment: 07/21/2014 "never drank much; might have a couple drinks/yr since MI 02/2013"  . Drug use: No  . Sexual activity: Yes    Birth control/protection: None  Lifestyle  . Physical activity:    Days per week: Not on file    Minutes per session: Not on file  . Stress: Not on file  Relationships  . Social connections:    Talks on phone: Not on file    Gets together: Not on file    Attends religious service: Not on file    Active member of club or organization: Not on file    Attends meetings of clubs or organizations: Not on file    Relationship status: Not on file  . Intimate partner violence:    Fear of current or ex partner: Not on file    Emotionally abused: Not on file    Physically  abused: Not on file    Forced sexual activity: Not on file  Other Topics Concern  . Not on file  Social History Narrative  . Not on file    Family History  Problem Relation Age  of Onset  . Heart disease Mother   . Diabetes Mother   . Stroke Mother   . Diabetes Father     ROS- All systems are reviewed and negative except as per the HPI above  Physical Exam: Vitals:   09/25/17 1019  BP: 110/64  Pulse: 76  Weight: 112.7 kg  Height: '5\' 7"'$  (1.702 m)   Wt Readings from Last 3 Encounters:  09/25/17 112.7 kg  09/15/17 112.4 kg  09/11/17 114.3 kg    Labs: Lab Results  Component Value Date   NA 141 09/25/2017   K 4.0 09/25/2017   CL 107 09/25/2017   CO2 22 09/25/2017   GLUCOSE 105 (H) 09/25/2017   BUN 13 09/25/2017   CREATININE 0.83 09/25/2017   CALCIUM 9.4 09/25/2017   MG 2.0 09/02/2017   Lab Results  Component Value Date   INR 1.27 08/24/2017   Lab Results  Component Value Date   CHOL 88 08/29/2017   HDL 23 (L) 08/29/2017   LDLCALC 41 08/29/2017   TRIG 118 08/29/2017     GEN- The patient is well appearing, alert and oriented x 3 today.   Head- normocephalic, atraumatic Eyes-  Sclera clear, conjunctiva pink Ears- hearing intact Oropharynx- clear Neck- supple, no JVP Lymph- no cervical lymphadenopathy Lungs- Clear to ausculation bilaterally, normal work of breathing Heart- Regular rate and rhythm, no murmurs, rubs or gallops, PMI not laterally displaced. Sternotomy site healing well. GI- soft, NT, ND, + BS Extremities- no clubbing, cyanosis, or edema. Vein graft site healing well, rt leg. MS- no significant deformity or atrophy Skin- no rash or lesion Psych- euthymic mood, full affect Neuro- strength and sensation are intact  EKG- Atrial flutter at 58 bpm, 4:1 AV conduction, qrs int 100 ms, qtc 410 ms( stable) Epic records reviewed    Assessment and Plan: 1.  Symptomatic Atrial fib/flutter Pt is loading on amiodarone 200 mg bid,started 8/18 Paul Todd  is rate controlled Paul Todd is taking xarelto 20 mg daily, states no missed doses Will plan for cardioversion 9/24. Paul Todd will lower amio to once daily on f/u one week after cardioversion Bmet/cbc today  2. CABG x 3 Appears to be recovering well from surgery  3. HTN Stable  F/u one week after cardioversion,10/3 F/u with Dr. Prescott Gum 9/25 as scheduled  Geroge Baseman. Ansen Sayegh, [facility] [address] [place], [place] [address] [phone]

## 2017-09-29 ENCOUNTER — Other Ambulatory Visit: Payer: Self-pay | Admitting: Cardiothoracic Surgery

## 2017-09-29 ENCOUNTER — Ambulatory Visit (HOSPITAL_COMMUNITY): Payer: BLUE CROSS/BLUE SHIELD | Admitting: Anesthesiology

## 2017-09-29 ENCOUNTER — Ambulatory Visit (HOSPITAL_COMMUNITY)
Admission: RE | Admit: 2017-09-29 | Discharge: 2017-09-29 | Disposition: A | Discharge status: Home / Self Care | Payer: BLUE CROSS/BLUE SHIELD | Source: Ambulatory Visit | Attending: Cardiovascular Disease | Admitting: Cardiovascular Disease

## 2017-09-29 ENCOUNTER — Encounter (HOSPITAL_COMMUNITY)
Admission: RE | Disposition: A | Discharge status: Home / Self Care | Payer: Self-pay | Source: Ambulatory Visit | Attending: Cardiovascular Disease

## 2017-09-29 ENCOUNTER — Encounter (HOSPITAL_COMMUNITY): Payer: Self-pay | Admitting: *Deleted

## 2017-09-29 DIAGNOSIS — Z955 Presence of coronary angioplasty implant and graft: Secondary | ICD-10-CM | POA: Diagnosis not present

## 2017-09-29 DIAGNOSIS — G4733 Obstructive sleep apnea (adult) (pediatric): Secondary | ICD-10-CM | POA: Diagnosis not present

## 2017-09-29 DIAGNOSIS — I483 Typical atrial flutter: Secondary | ICD-10-CM

## 2017-09-29 DIAGNOSIS — Z86711 Personal history of pulmonary embolism: Secondary | ICD-10-CM | POA: Insufficient documentation

## 2017-09-29 DIAGNOSIS — Z833 Family history of diabetes mellitus: Secondary | ICD-10-CM | POA: Diagnosis not present

## 2017-09-29 DIAGNOSIS — Z79899 Other long term (current) drug therapy: Secondary | ICD-10-CM | POA: Diagnosis not present

## 2017-09-29 DIAGNOSIS — E119 Type 2 diabetes mellitus without complications: Secondary | ICD-10-CM | POA: Insufficient documentation

## 2017-09-29 DIAGNOSIS — Z823 Family history of stroke: Secondary | ICD-10-CM | POA: Insufficient documentation

## 2017-09-29 DIAGNOSIS — Z87891 Personal history of nicotine dependence: Secondary | ICD-10-CM | POA: Diagnosis not present

## 2017-09-29 DIAGNOSIS — M1990 Unspecified osteoarthritis, unspecified site: Secondary | ICD-10-CM | POA: Insufficient documentation

## 2017-09-29 DIAGNOSIS — E785 Hyperlipidemia, unspecified: Secondary | ICD-10-CM | POA: Diagnosis not present

## 2017-09-29 DIAGNOSIS — Z7901 Long term (current) use of anticoagulants: Secondary | ICD-10-CM | POA: Insufficient documentation

## 2017-09-29 DIAGNOSIS — Z8249 Family history of ischemic heart disease and other diseases of the circulatory system: Secondary | ICD-10-CM | POA: Diagnosis not present

## 2017-09-29 DIAGNOSIS — I10 Essential (primary) hypertension: Secondary | ICD-10-CM | POA: Diagnosis not present

## 2017-09-29 DIAGNOSIS — I480 Paroxysmal atrial fibrillation: Secondary | ICD-10-CM

## 2017-09-29 DIAGNOSIS — I2510 Atherosclerotic heart disease of native coronary artery without angina pectoris: Secondary | ICD-10-CM | POA: Diagnosis not present

## 2017-09-29 DIAGNOSIS — G510 Bell's palsy: Secondary | ICD-10-CM | POA: Insufficient documentation

## 2017-09-29 DIAGNOSIS — I481 Persistent atrial fibrillation: Secondary | ICD-10-CM | POA: Insufficient documentation

## 2017-09-29 DIAGNOSIS — I252 Old myocardial infarction: Secondary | ICD-10-CM | POA: Diagnosis not present

## 2017-09-29 DIAGNOSIS — Z951 Presence of aortocoronary bypass graft: Secondary | ICD-10-CM

## 2017-09-29 DIAGNOSIS — Z87442 Personal history of urinary calculi: Secondary | ICD-10-CM | POA: Diagnosis not present

## 2017-09-29 DIAGNOSIS — Z7982 Long term (current) use of aspirin: Secondary | ICD-10-CM | POA: Insufficient documentation

## 2017-09-29 DIAGNOSIS — I4892 Unspecified atrial flutter: Secondary | ICD-10-CM | POA: Insufficient documentation

## 2017-09-29 DIAGNOSIS — I25810 Atherosclerosis of coronary artery bypass graft(s) without angina pectoris: Secondary | ICD-10-CM | POA: Diagnosis not present

## 2017-09-29 HISTORY — PX: CARDIOVERSION: SHX1299

## 2017-09-29 LAB — GLUCOSE, CAPILLARY: Glucose-Capillary: 98 mg/dL (ref 70–99)

## 2017-09-29 SURGERY — CARDIOVERSION
Anesthesia: General

## 2017-09-29 MED ORDER — PROPOFOL 10 MG/ML IV BOLUS
INTRAVENOUS | Status: DC | PRN
  Administered 2017-09-29: 80 mg via INTRAVENOUS

## 2017-09-29 MED ORDER — FENTANYL CITRATE (PF) 100 MCG/2ML IJ SOLN: 25.0000 ug | INTRAMUSCULAR | Status: DC | PRN

## 2017-09-29 MED ORDER — LIDOCAINE 2% (20 MG/ML) 5 ML SYRINGE
INTRAMUSCULAR | Status: DC | PRN
  Administered 2017-09-29: 80 mg via INTRAVENOUS

## 2017-09-29 MED ORDER — SODIUM CHLORIDE 0.9 % IV SOLN
INTRAVENOUS | Status: DC | PRN
  Administered 2017-09-29: 13:00:00 via INTRAVENOUS

## 2017-09-29 MED ORDER — ONDANSETRON HCL 4 MG/2ML IJ SOLN: 4.0000 mg | Freq: Once | INTRAMUSCULAR | Status: DC | PRN

## 2017-09-29 NOTE — Anesthesia Postprocedure Evaluation (Signed)
Anesthesia Post Note  Patient: Paul Todd  Procedure(s) Performed: CARDIOVERSION (N/A )     Patient location during evaluation: PACU Anesthesia Type: General Level of consciousness: awake and alert Pain management: pain level controlled Vital Signs Assessment: post-procedure vital signs reviewed and stable Respiratory status: spontaneous breathing, nonlabored ventilation, respiratory function stable and patient connected to nasal cannula oxygen Cardiovascular status: blood pressure returned to baseline and stable Postop Assessment: no apparent nausea or vomiting Anesthetic complications: no    Last Vitals:  Vitals:   09/29/17 1328 09/29/17 1337  BP: (!) 96/46 135/72  Pulse:  72  Resp: 20 (!) 22  Temp: 36.8 C   SpO2: 98% 94%    Last Pain:  Vitals:   09/29/17 1337  TempSrc:   PainSc: 0-No pain                 [name] P Zohar Laing

## 2017-09-29 NOTE — Interval H&P Note (Signed)
History and Physical Interval Note:  09/29/2017 1:04 PM  Paul Todd  has presented today for surgery, with the diagnosis of A-FIB  The various methods of treatment have been discussed with the patient and family. After consideration of risks, benefits and other options for treatment, the patient has consented to  Procedure(s): CARDIOVERSION (N/A) as a surgical intervention .  The patient's history has been reviewed, patient examined, no change in status, stable for surgery.  I have reviewed the patient's chart and labs.  Questions were answered to the patient's satisfaction.     Paul Todd

## 2017-09-29 NOTE — Discharge Instructions (Signed)
Electrical Cardioversion, Care After °This sheet gives you information about how to care for yourself after your procedure. Your health care provider may also give you more specific instructions. If you have problems or questions, contact your health care provider. °What can I expect after the procedure? °After the procedure, it is common to have: °· Some redness on the skin where the shocks were given. ° °Follow these instructions at home: °· Do not drive for 24 hours if you were given a medicine to help you relax (sedative). °· Take over-the-counter and prescription medicines only as told by your health care provider. °· Ask your health care provider how to check your pulse. Check it often. °· Rest for 48 hours after the procedure or as told by your health care provider. °· Avoid or limit your caffeine use as told by your health care provider. °Contact a health care provider if: °· You feel like your heart is beating too quickly or your pulse is not regular. °· You have a serious muscle cramp that does not go away. °Get help right away if: °· You have discomfort in your chest. °· You are dizzy or you feel faint. °· You have trouble breathing or you are short of breath. °· Your speech is slurred. °· You have trouble moving an arm or leg on one side of your body. °· Your fingers or toes turn cold or blue. °This information is not intended to replace advice given to you by your health care provider. Make sure you discuss any questions you have with your health care provider. °Document Released: 10/13/2012 Document Revised: 07/27/2015 Document Reviewed: 06/29/2015 °Elsevier Interactive Patient Education © 2018 [employer] ° °

## 2017-09-29 NOTE — Op Note (Signed)
Procedure: Electrical Cardioversion Indications:  Atrial Flutter  Procedure Details:  Consent: Risks of procedure as well as the alternatives and risks of each were explained to the (patient/caregiver).  Consent for procedure obtained.  Time Out: Verified patient identification, verified procedure, site/side was marked, verified correct patient position, special equipment/implants available, medications/allergies/relevent history reviewed, required imaging and test results available.  Performed  Patient placed on cardiac monitor, pulse oximetry, supplemental oxygen as necessary.  Sedation given: Propofol 80 mg IV, Dr. Roanna Banning Pacer pads placed anterior and posterior chest.  Cardioverted 1 time(s).  Cardioversion with synchronized biphasic 120J shock.  Evaluation: Findings: Post procedure EKG shows: NSR Complications: None Patient did tolerate procedure well.  Time Spent Directly with the Patient:  30 minutes   Paul Todd 09/29/2017, 1:19 PM

## 2017-09-29 NOTE — Anesthesia Procedure Notes (Signed)
Procedure Name: General with mask airway Date/Time: 09/29/2017 1:16 PM Performed by: Leonor Liv, CRNA Oxygen Delivery Method: Ambu bag Preoxygenation: Pre-oxygenation with 100% oxygen Induction Type: IV induction

## 2017-09-29 NOTE — Transfer of Care (Signed)
Immediate Anesthesia Transfer of Care Note  Patient: Paul Todd  Procedure(s) Performed: CARDIOVERSION (N/A )  Patient Location: Endoscopy Unit  Anesthesia Type:General  Level of Consciousness: awake, alert  and oriented  Airway & Oxygen Therapy: Patient Spontanous Breathing and Patient connected to nasal cannula oxygen  Post-op Assessment: Report given to RN, Post -op Vital signs reviewed and stable and Patient moving all extremities  Post vital signs: Reviewed and stable  Last Vitals:  Vitals Value Taken Time  BP 110/57 09/29/2017  1:21 PM  Temp    Pulse 71 09/29/2017  1:23 PM  Resp 14 09/29/2017  1:23 PM  SpO2 99 % 09/29/2017  1:23 PM  Vitals shown include unvalidated device data.  Last Pain:  Vitals:   09/29/17 1253  TempSrc: Oral  PainSc: 0-No pain         Complications: No apparent anesthesia complications

## 2017-09-29 NOTE — Anesthesia Preprocedure Evaluation (Addendum)
Anesthesia Evaluation  Patient identified by MRN, date of birth, ID band Patient awake    Reviewed: Allergy & Precautions, NPO status , Patient's Chart, lab work & pertinent test results, reviewed documented beta blocker date and time   Airway Mallampati: II  TM Distance: >3 FB Neck ROM: Full    Dental no notable dental hx.    Pulmonary sleep apnea and Continuous Positive Airway Pressure Ventilation , former smoker, PE   Pulmonary exam normal breath sounds clear to auscultation       Cardiovascular hypertension, Pt. on medications and Pt. on home beta blockers + angina + CAD, + Past MI, + Cardiac Stents, + CABG and + Peripheral Vascular Disease  Normal cardiovascular exam+ dysrhythmias Atrial Fibrillation  Rhythm:Regular Rate:Normal  Echo 08/31/17: Study Conclusions  - Left ventricle: The cavity size was normal. Wall thickness was normal. Systolic function was normal. The estimated ejection fraction was in the range of 55% to 60%. No evidence of thrombus. - Mitral valve: No evidence of vegetation. - Left atrium: No evidence of thrombus in the atrial cavity or appendage. No evidence of thrombus in the atrial cavity or appendage. - Right ventricle: The cavity size was normal. Wall thickness was normal. Systolic function was normal. - Right atrium: No evidence of thrombus in the atrial cavity or appendage. - Atrial septum: Hypermobile interatrial septum.  Impressions:  - Successful cardioversion. No cardiac source of emboli was indentified.   Neuro/Psych negative neurological ROS     GI/Hepatic Neg liver ROS, GERD  Medicated and Controlled,  Endo/Other  diabetesObesity   Renal/GU negative Renal ROS     Musculoskeletal  (+) Arthritis ,   Abdominal   Peds  Hematology  (+) Blood dyscrasia (Xarelto), anemia ,   Anesthesia Other Findings Day of surgery medications reviewed with the patient.   Reproductive/Obstetrics                            Anesthesia Physical Anesthesia Plan  ASA: III  Anesthesia Plan: General   Post-op Pain Management:    Induction: Intravenous  PONV Risk Score and Plan: 2 and Treatment may vary due to age or medical condition and Propofol infusion  Airway Management Planned: Mask  Additional Equipment:   Intra-op Plan:   Post-operative Plan:   Informed Consent: I have reviewed the patients History and Physical, chart, labs and discussed the procedure including the risks, benefits and alternatives for the proposed anesthesia with the patient or authorized representative who has indicated his/her understanding and acceptance.   Dental advisory given  Plan Discussed with: CRNA  Anesthesia Plan Comments:        Anesthesia Quick Evaluation

## 2017-09-30 ENCOUNTER — Encounter: Payer: Self-pay | Admitting: Cardiothoracic Surgery

## 2017-09-30 ENCOUNTER — Other Ambulatory Visit: Payer: Self-pay

## 2017-09-30 ENCOUNTER — Ambulatory Visit
Admission: RE | Admit: 2017-09-30 | Discharge: 2017-09-30 | Disposition: A | Discharge status: Home / Self Care | Payer: BLUE CROSS/BLUE SHIELD | Source: Ambulatory Visit | Attending: Cardiothoracic Surgery | Admitting: Cardiothoracic Surgery

## 2017-09-30 ENCOUNTER — Ambulatory Visit (INDEPENDENT_AMBULATORY_CARE_PROVIDER_SITE_OTHER): Payer: Self-pay | Admitting: Cardiothoracic Surgery

## 2017-09-30 VITALS — BP 97/61 | HR 72 | Resp 20 | Ht 67.0 in | Wt 246.0 lb

## 2017-09-30 DIAGNOSIS — Z951 Presence of aortocoronary bypass graft: Secondary | ICD-10-CM

## 2017-09-30 DIAGNOSIS — I2510 Atherosclerotic heart disease of native coronary artery without angina pectoris: Secondary | ICD-10-CM

## 2017-09-30 MED ORDER — GABAPENTIN 300 MG PO CAPS: 300.0000 mg | ORAL_CAPSULE | Freq: Two times a day (BID) | ORAL | 0 refills | Status: DC

## 2017-09-30 MED ORDER — GABAPENTIN 100 MG PO CAPS: 300.0000 mg | ORAL_CAPSULE | Freq: Two times a day (BID) | ORAL | Status: DC

## 2017-09-30 NOTE — Progress Notes (Signed)
PCP is Everardo Beals, NP Referring Provider is Pixie Casino, MD  Chief Complaint  Patient presents with  . Routine Post Op    f/u from surgery with CXR s/p CABG x3, 08/24/2017    HPI: Scheduled follow-up after urgent CABG x3 for unstable angina.  Patient had a preop non-STEMI.  Patient had postop atrial fib-flutter and required cardioversion both in the hospital and post discharge.  He is currently in sinus rhythm taking Xarelto, amiodarone, diltiazem, and metoprolol.  He is followed by cardiology.  Today he states his surgical pain is improved but he has hypersensitivity and dysesthesia over the chest.  The sternum appears to be healing well.  He is having nerve pain and will be given a short course of oral gabapentin.  He is not taking narcotics.  Chest x-ray today is clear. Leg incisions are healing well. No peripheral edema.  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  He knows he now can lift up to 20 pounds maximum.  He is scheduled to start cardiac rehab in early November but will intend to walk 30 minutes a day 5 days a week.   Past Medical History:  Diagnosis Date  . Arthritis       . CAD (coronary artery disease)    multivessel per cath 02/12/13, s/p stenting to RCA  . Chronic lower back pain   . Diabetes mellitus without complication ([place])   . Dyslipidemia   . History of kidney stones   . Hx of Bell's palsy   . Hypertension   . Myocardial infarction ([place]) 02/12/2013   . Nephrolithiasis   . Neuromuscular disorder (HCC)    Bell's Palsy  . OSA on CPAP   . PE (pulmonary thromboembolism) ([place]) 02/2014  . Persistent atrial fibrillation [facility])         Past Surgical History:  Procedure Laterality Date  . ABDOMINAL AORTOGRAM W/LOWER EXTREMITY N/A 01/26/2017   Procedure: ABDOMINAL AORTOGRAM W/LOWER EXTREMITY;  Surgeon: Angelia Mould, MD;  Location: [place] CV LAB;  Service: Cardiovascular;   Laterality: N/A;  . BACK SURGERY  2017   laminectomy  . CARDIOVERSION N/A 02/15/2013   Procedure: CARDIOVERSION;  Surgeon: Thayer Headings, MD;  Location: [place];  Service: Cardiovascular;  Laterality: N/A;  . CARDIOVERSION N/A 07/07/2014   Procedure: CARDIOVERSION;  Surgeon: Pixie Casino, MD;  Location: [facility] ENDOSCOPY;  Service: Cardiovascular;  Laterality: N/A;  . CARDIOVERSION N/A 08/31/2017   Procedure: CARDIOVERSION;  Surgeon: Dorothy Spark, MD;  Location: [place];  Service: Cardiovascular;  Laterality: N/A;  . CORONARY ANGIOPLASTY WITH STENT PLACEMENT  02/12/2013  . CORONARY ARTERY BYPASS GRAFT N/A 08/24/2017   Procedure: CORONARY ARTERY BYPASS GRAFTING (CABG) X3. RIGHT ENDOSCOPIC SAPHENOUS VEIN HARVEST. MAMM TO LAD, SVG TO OM, SVG TO RAMUS;  Surgeon: Ivin Poot, MD;  Location: [place];  Service: Open Heart Surgery;  Laterality: N/A;  . CYSTOSCOPY WITH RETROGRADE PYELOGRAM, URETEROSCOPY AND STENT PLACEMENT Left 09/02/2013   Procedure: CYSTOSCOPY WITH RETROGRADE PYELOGRAM/LEFT URETEROSCOPY/URETERAL STENT, with fulguration;  Surgeon: Festus Aloe, MD;  [facility];  Service: Urology;  Laterality: Left;  . CYSTOSCOPY WITH URETEROSCOPY AND STENT PLACEMENT Left 09/16/2013   Procedure: CYSTOSCOPY WITH URETEROSCOPY AND STENT PLACEMENT;  Surgeon: Festus Aloe, MD;  [facility];  Service: Urology;  Laterality: Left;  . FEMORAL BYPASS Left 04/07/2017   below the knee popliteal  . FEMORAL-POPLITEAL BYPASS GRAFT  Left 04/07/2017   Procedure: LEFT FEMORAL-BELOW KNEE POPLITEAL ARTERY BYPASS USING COMPOSITE LEFT GREATER SAPHENOUS VEIN;  Surgeon: Angelia Mould, MD;  Location: [place];  Service: Vascular;  Laterality: Left;  . HOLMIUM LASER APPLICATION Left 9/98/3382   Procedure: HOLMIUM LASER APPLICATION;  Surgeon: Festus Aloe, MD;  [facility];  Service: Urology;  Laterality: Left;  . INTRAVASCULAR PRESSURE WIRE/FFR STUDY N/A 08/21/2017   Procedure:  INTRAVASCULAR PRESSURE WIRE/FFR STUDY;  Surgeon: Sherren Mocha, MD;  Location: [place] CV LAB;  Service: Cardiovascular;  Laterality: N/A;  . LEFT HEART CATH AND CORONARY ANGIOGRAPHY N/A 08/21/2017   Procedure: LEFT HEART CATH AND CORONARY ANGIOGRAPHY;  Surgeon: Sherren Mocha, MD;  Location: [place] CV LAB;  Service: Cardiovascular;  Laterality: N/A;  . LEFT HEART CATHETERIZATION WITH CORONARY ANGIOGRAM N/A 02/12/2013   Procedure: LEFT HEART CATHETERIZATION WITH CORONARY ANGIOGRAM;  Surgeon: Troy Sine, MD;  Location: [facility] CATH LAB;  Service: Cardiovascular;  Laterality: N/A;  . PERCUTANEOUS CORONARY STENT INTERVENTION (PCI-S)  02/12/2013   Procedure: PERCUTANEOUS CORONARY STENT INTERVENTION (PCI-S);  Surgeon: Troy Sine, MD;  Location: [facility] CATH LAB;  Service: Cardiovascular;;  STEMI  . PERCUTANEOUS CORONARY STENT INTERVENTION (PCI-S) N/A 02/14/2013   Procedure: PERCUTANEOUS CORONARY STENT INTERVENTION (PCI-S);  Surgeon: Burnell Blanks, MD;  Location: [facility] CATH LAB;  Service: Cardiovascular;  Laterality: N/A;  Proximal LAD  . TEE WITHOUT CARDIOVERSION N/A 02/15/2013   Procedure: TRANSESOPHAGEAL ECHOCARDIOGRAM (TEE);  Surgeon: Thayer Headings, MD;  Location: [place];  Service: Cardiovascular;  Laterality: N/A;  . TEE WITHOUT CARDIOVERSION N/A 08/24/2017   Procedure: TRANSESOPHAGEAL ECHOCARDIOGRAM (TEE);  Surgeon: Prescott Gum, Collier Salina, MD;  Location: [place];  Service: Open Heart Surgery;  Laterality: N/A;  . TEE WITHOUT CARDIOVERSION N/A 08/31/2017   Procedure: TRANSESOPHAGEAL ECHOCARDIOGRAM (TEE);  Surgeon: Dorothy Spark, MD;  Location: [facility] ENDOSCOPY;  Service: Cardiovascular;  Laterality: N/A;    Family History  Problem Relation Age of Onset  . Heart disease Mother   . Diabetes Mother   . Stroke Mother   . Diabetes Father     Social History Social History   Tobacco Use  . Smoking status: Former Smoker    Packs/day: 0.50    Years: 35.00    Pack years: 17.50    Types:  Cigarettes    Last attempt to quit: 02/12/2013    Years since quitting: 4.6  . Smokeless tobacco: Never Used  Substance Use Topics  . Alcohol use: No    Comment: 07/21/2014 "never drank much; might have a couple drinks/yr since MI 02/2013"  . Drug use: No    Current Outpatient Medications  Medication Sig Dispense Refill  . ACCU-CHEK AVIVA PLUS test strip USE UP TO 4 TIMES DAILY AS DIRECTED 120 each 0  . acetaminophen (TYLENOL) 650 MG CR tablet Take 650 mg by mouth every 8 (eight) hours as needed for pain.    Marland Kitchen albuterol (PROVENTIL HFA;VENTOLIN HFA) 108 (90 Base) MCG/ACT inhaler Inhale 2 puffs into the lungs every 4 (four) hours as needed for wheezing or shortness of breath.     Marland Kitchen amiodarone (PACERONE) 200 MG tablet Take 1 tablet (200 mg total) by mouth 2 (two) times daily. 60 tablet 3  . aspirin EC 81 MG tablet Take 81 mg by mouth daily.    Marland Kitchen atorvastatin (LIPITOR) 80 MG tablet Take 80 mg by mouth daily.    . blood glucose meter kit and supplies KIT Dispense based on patient and insurance preference. Use  up to four times daily as directed. (FOR ICD-9 250.00, 250.01). 1 each 0  . diltiazem (CARDIZEM SR) 120 MG 12 hr capsule Take 1 capsule (120 mg total) by mouth every 12 (twelve) hours. 60 capsule 3  . docusate sodium (COLACE) 100 MG capsule Take 100 mg by mouth daily.   5  . DULoxetine (CYMBALTA) 60 MG capsule Take 60 mg by mouth every evening.    . furosemide (LASIX) 40 MG tablet Take 1 tablet (40 mg total) by mouth daily. 30 tablet 0  . JARDIANCE 10 MG TABS tablet Take 10 mg by mouth daily.  3  . magnesium oxide (MAG-OX) 400 (241.3 MG) MG tablet Take 1 tablet (400 mg total) by mouth daily. (Patient taking differently: Take 250 mg by mouth daily. ) 30 tablet 11  . Metoprolol Tartrate 37.5 MG TABS Take 37.5 mg by mouth 2 (two) times daily.    . Multiple Vitamin (MULTIVITAMIN WITH MINERALS) TABS tablet Take 1 tablet by mouth daily.     . nitroGLYCERIN (NITROSTAT) 0.4 MG SL tablet PLACE 1 TABLET  UNDER TONGUE EVERY 5 MINUTES AS NEEDED FOR CHEST PAIN MAX 3 TABLETS (Patient taking differently: Place 0.4 mg under the tongue every 5 (five) minutes as needed for chest pain. ) 25 tablet 3  . NON FORMULARY at bedtime. CPAP    . pantoprazole (PROTONIX) 40 MG tablet Take 40 mg by mouth daily.    Marland Kitchen POTASSIUM PO Take 1 tablet by mouth daily.    . rivaroxaban (XARELTO) 20 MG TABS tablet Take 1 tablet (20 mg total) daily with supper by mouth. 90 tablet 3   Current Facility-Administered Medications  Medication Dose Route Frequency Provider Last Rate Last Dose  . gabapentin (NEURONTIN) capsule 300 mg  300 mg Oral BID Prescott Gum, Collier Salina, MD        No Known Allergies  Review of Systems  No recurrent angina Strength and energy are improved  BP 97/61   Pulse 72   Resp 20   Ht _0  (1.702 m)   Wt 246 lb (111.6 kg)   SpO2 96% Comment: RA  BMI 38.53 kg/m  Physical Exam      Exam    General- alert and comfortable    Neck- no JVD, no cervical adenopathy palpable, no carotid bruit   Lungs- clear without rales, wheezes   Cor- regular rate and rhythm, no murmur , gallop   Abdomen- soft, non-tender   Extremities - warm, non-tender, minimal edema   Neuro- oriented, appropriate, no focal weakness   Diagnostic Tests: Chest x-ray clear sternal wires intact. Impression: Doing well initially after urgent multivessel CABG He has recovered approximately 50%. He will need to stay on his meds for the postoperative atrial arrhythmia including Xarelto. He can walk 30 minutes a day as his goal and can lift up to 20 pounds maximum  Plan: Return in 1 month for review of progress. Gabapentin called into his pharmacist for neuritic chest pain  Len Childs, MD [facility] [phone]

## 2017-10-01 ENCOUNTER — Other Ambulatory Visit: Payer: Self-pay | Admitting: Physician Assistant

## 2017-10-08 ENCOUNTER — Encounter (HOSPITAL_COMMUNITY): Payer: Self-pay | Admitting: Nurse Practitioner

## 2017-10-08 ENCOUNTER — Encounter: Payer: Self-pay | Admitting: Registered"

## 2017-10-08 ENCOUNTER — Ambulatory Visit (HOSPITAL_COMMUNITY)
Admission: RE | Admit: 2017-10-08 | Discharge: 2017-10-08 | Disposition: A | Discharge status: Home / Self Care | Payer: BLUE CROSS/BLUE SHIELD | Source: Ambulatory Visit | Attending: Nurse Practitioner | Admitting: Nurse Practitioner

## 2017-10-08 ENCOUNTER — Encounter: Payer: BLUE CROSS/BLUE SHIELD | Attending: *Deleted | Admitting: Registered"

## 2017-10-08 VITALS — BP 106/68 | HR 92 | Ht 67.0 in | Wt 243.0 lb

## 2017-10-08 DIAGNOSIS — Z87891 Personal history of nicotine dependence: Secondary | ICD-10-CM | POA: Diagnosis not present

## 2017-10-08 DIAGNOSIS — I4819 Other persistent atrial fibrillation: Secondary | ICD-10-CM | POA: Insufficient documentation

## 2017-10-08 DIAGNOSIS — I25810 Atherosclerosis of coronary artery bypass graft(s) without angina pectoris: Secondary | ICD-10-CM | POA: Diagnosis not present

## 2017-10-08 DIAGNOSIS — Z713 Dietary counseling and surveillance: Secondary | ICD-10-CM | POA: Diagnosis not present

## 2017-10-08 DIAGNOSIS — I483 Typical atrial flutter: Secondary | ICD-10-CM

## 2017-10-08 DIAGNOSIS — I10 Essential (primary) hypertension: Secondary | ICD-10-CM | POA: Insufficient documentation

## 2017-10-08 DIAGNOSIS — Z7901 Long term (current) use of anticoagulants: Secondary | ICD-10-CM | POA: Insufficient documentation

## 2017-10-08 DIAGNOSIS — E785 Hyperlipidemia, unspecified: Secondary | ICD-10-CM | POA: Diagnosis not present

## 2017-10-08 DIAGNOSIS — Z823 Family history of stroke: Secondary | ICD-10-CM | POA: Diagnosis not present

## 2017-10-08 DIAGNOSIS — G4733 Obstructive sleep apnea (adult) (pediatric): Secondary | ICD-10-CM | POA: Diagnosis not present

## 2017-10-08 DIAGNOSIS — Z955 Presence of coronary angioplasty implant and graft: Secondary | ICD-10-CM | POA: Insufficient documentation

## 2017-10-08 DIAGNOSIS — I4892 Unspecified atrial flutter: Secondary | ICD-10-CM | POA: Diagnosis not present

## 2017-10-08 DIAGNOSIS — Z8249 Family history of ischemic heart disease and other diseases of the circulatory system: Secondary | ICD-10-CM | POA: Insufficient documentation

## 2017-10-08 DIAGNOSIS — Z833 Family history of diabetes mellitus: Secondary | ICD-10-CM | POA: Insufficient documentation

## 2017-10-08 DIAGNOSIS — Z951 Presence of aortocoronary bypass graft: Secondary | ICD-10-CM | POA: Diagnosis not present

## 2017-10-08 DIAGNOSIS — I4811 Longstanding persistent atrial fibrillation: Secondary | ICD-10-CM | POA: Diagnosis not present

## 2017-10-08 DIAGNOSIS — E119 Type 2 diabetes mellitus without complications: Secondary | ICD-10-CM | POA: Insufficient documentation

## 2017-10-08 DIAGNOSIS — I252 Old myocardial infarction: Secondary | ICD-10-CM | POA: Diagnosis not present

## 2017-10-08 DIAGNOSIS — Z86711 Personal history of pulmonary embolism: Secondary | ICD-10-CM | POA: Insufficient documentation

## 2017-10-08 DIAGNOSIS — Z79899 Other long term (current) drug therapy: Secondary | ICD-10-CM | POA: Insufficient documentation

## 2017-10-08 DIAGNOSIS — Z95828 Presence of other vascular implants and grafts: Secondary | ICD-10-CM | POA: Insufficient documentation

## 2017-10-08 DIAGNOSIS — I2510 Atherosclerotic heart disease of native coronary artery without angina pectoris: Secondary | ICD-10-CM | POA: Diagnosis not present

## 2017-10-08 DIAGNOSIS — Z87442 Personal history of urinary calculi: Secondary | ICD-10-CM | POA: Insufficient documentation

## 2017-10-08 DIAGNOSIS — Z9989 Dependence on other enabling machines and devices: Secondary | ICD-10-CM

## 2017-10-08 DIAGNOSIS — M1990 Unspecified osteoarthritis, unspecified site: Secondary | ICD-10-CM | POA: Diagnosis not present

## 2017-10-08 DIAGNOSIS — Z7982 Long term (current) use of aspirin: Secondary | ICD-10-CM | POA: Insufficient documentation

## 2017-10-08 DIAGNOSIS — Z7984 Long term (current) use of oral hypoglycemic drugs: Secondary | ICD-10-CM | POA: Diagnosis not present

## 2017-10-08 HISTORY — DX: Unspecified atrial flutter: I48.92

## 2017-10-08 NOTE — Patient Instructions (Addendum)
Consider having a snack in the evening, something like apple with peanut butter, might bring down your morning blood sugar. Also think about the amount of saturated fat in your evening meal. Whole grains back into your diet may be a good idea, balance with protein an non-starchy vegetables. When eating carbs be sure to include protein. Idea for protein when having oatmeal would be some [place] yogurt. Continue to check your blood sugar and use the numbers to understand how to make lifestyle changes. Consider looking into metformin and talk to your doctor to see if it is contraindicated. If you cannot take metformin, you could try some apple cider vinegar diluted before meals with carbs, may help a little.

## 2017-10-08 NOTE — Progress Notes (Signed)
Primary Care Physician: Everardo Beals, NP Primary Cardiologist: [facility] Primary Electrophysiologist: Foye Clock is a 57 y.o. male with a history of persistent atrial fibrillation and atrial flutter who presents for follow up in the [facility].  Since last being seen in clinic, the patient reports doing reasonably well.  He felt better in SR but has returned to atrial flutter likely in the last week by symptoms (increased fatigue). He continues to recover from CABG (no MAZE done). He has had morning "lightheadedness" that persists for a couple of hours.  Today, he  denies symptoms of  chest pain, orthopnea, PND, lower extremity edema,presyncope, syncope, snoring, daytime somnolence, bleeding, or neurologic sequela. The patient is tolerating medications without difficulties and is otherwise without complaint today.    Atrial Fibrillation Risk Factors:  he does have symptoms or diagnosis of sleep apnea. he is compliant with CPAP therapy.  he does not have a history of rheumatic fever.  he does not have a history of alcohol use.  he has a BMI of Body mass index is 38.06 kg/m.Marland Kitchen Filed Weights   10/08/17 0925  Weight: 110.2 kg    LA size: 40   Atrial Fibrillation Management history:  Previous antiarrhythmic drugs: Tikosyn -> changed to amiodarone during admission for CABG 08/2017  Previous cardioversions: 02/2013,  07/2014, 08/2017, 09/2017  Previous ablations: none  CHADS2VASC score: 3  Anticoagulation history: Xarelto   Past Medical History:  Diagnosis Date  . Arthritis       . CAD (coronary artery disease)    multivessel per cath 02/12/13, s/p stenting to RCA  . Chronic lower back pain   . Diabetes mellitus without complication ([place])   . Dyslipidemia   . History of kidney stones   . Hx of Bell's palsy   . Hypertension   . Myocardial infarction ([place]) 02/12/2013   . Nephrolithiasis   . Neuromuscular disorder (HCC)    Bell's  Palsy  . OSA on CPAP   . PE (pulmonary thromboembolism) ([place]) 02/2014  . Persistent atrial fibrillation        Past Surgical History:  Procedure Laterality Date  . ABDOMINAL AORTOGRAM W/LOWER EXTREMITY N/A 01/26/2017   Procedure: ABDOMINAL AORTOGRAM W/LOWER EXTREMITY;  Surgeon: Angelia Mould, MD;  Location: [place] CV LAB;  Service: Cardiovascular;  Laterality: N/A;  . BACK SURGERY  2017   laminectomy  . CARDIOVERSION N/A 02/15/2013   Procedure: CARDIOVERSION;  Surgeon: Thayer Headings, MD;  Location: [place];  Service: Cardiovascular;  Laterality: N/A;  . CARDIOVERSION N/A 07/07/2014   Procedure: CARDIOVERSION;  Surgeon: Pixie Casino, MD;  Location: [facility] ENDOSCOPY;  Service: Cardiovascular;  Laterality: N/A;  . CARDIOVERSION N/A 08/31/2017   Procedure: CARDIOVERSION;  Surgeon: Dorothy Spark, MD;  Location: [facility] ENDOSCOPY;  Service: Cardiovascular;  Laterality: N/A;  . CARDIOVERSION N/A 09/29/2017   Procedure: CARDIOVERSION;  Surgeon: Sanda Klein, MD;  Location: [place];  Service: Cardiovascular;  Laterality: N/A;  . CORONARY ANGIOPLASTY WITH STENT PLACEMENT  02/12/2013  . CORONARY ARTERY BYPASS GRAFT N/A 08/24/2017   Procedure: CORONARY ARTERY BYPASS GRAFTING (CABG) X3. RIGHT ENDOSCOPIC SAPHENOUS VEIN HARVEST. MAMM TO LAD, SVG TO OM, SVG TO RAMUS;  Surgeon: Ivin Poot, MD;  Location: [place];  Service: Open Heart Surgery;  Laterality: N/A;  . CYSTOSCOPY WITH RETROGRADE PYELOGRAM, URETEROSCOPY AND STENT PLACEMENT Left 09/02/2013   Procedure: CYSTOSCOPY WITH RETROGRADE PYELOGRAM/LEFT URETEROSCOPY/URETERAL STENT, with fulguration;  Surgeon: Festus Aloe, MD;  Location:  [facility];  Service: Urology;  Laterality: Left;  . CYSTOSCOPY WITH URETEROSCOPY AND STENT PLACEMENT Left 09/16/2013   Procedure: CYSTOSCOPY WITH URETEROSCOPY AND STENT PLACEMENT;  Surgeon: Festus Aloe, MD;  [facility];  Service: Urology;  Laterality: Left;  . FEMORAL BYPASS Left 04/07/2017     below the knee popliteal  . FEMORAL-POPLITEAL BYPASS GRAFT Left 04/07/2017   Procedure: LEFT FEMORAL-BELOW KNEE POPLITEAL ARTERY BYPASS USING COMPOSITE LEFT GREATER SAPHENOUS VEIN;  Surgeon: Angelia Mould, MD;  Location: [place];  Service: Vascular;  Laterality: Left;  . HOLMIUM LASER APPLICATION Left 5/00/9381   Procedure: HOLMIUM LASER APPLICATION;  Surgeon: Festus Aloe, MD;  [facility];  Service: Urology;  Laterality: Left;  . INTRAVASCULAR PRESSURE WIRE/FFR STUDY N/A 08/21/2017   Procedure: INTRAVASCULAR PRESSURE WIRE/FFR STUDY;  Surgeon: Sherren Mocha, MD;  Location: [place] CV LAB;  Service: Cardiovascular;  Laterality: N/A;  . LEFT HEART CATH AND CORONARY ANGIOGRAPHY N/A 08/21/2017   Procedure: LEFT HEART CATH AND CORONARY ANGIOGRAPHY;  Surgeon: Sherren Mocha, MD;  Location: [place] CV LAB;  Service: Cardiovascular;  Laterality: N/A;  . LEFT HEART CATHETERIZATION WITH CORONARY ANGIOGRAM N/A 02/12/2013   Procedure: LEFT HEART CATHETERIZATION WITH CORONARY ANGIOGRAM;  Surgeon: Troy Sine, MD;  Location: [facility] CATH LAB;  Service: Cardiovascular;  Laterality: N/A;  . PERCUTANEOUS CORONARY STENT INTERVENTION (PCI-S)  02/12/2013   Procedure: PERCUTANEOUS CORONARY STENT INTERVENTION (PCI-S);  Surgeon: Troy Sine, MD;  Location: [facility] CATH LAB;  Service: Cardiovascular;;  STEMI  . PERCUTANEOUS CORONARY STENT INTERVENTION (PCI-S) N/A 02/14/2013   Procedure: PERCUTANEOUS CORONARY STENT INTERVENTION (PCI-S);  Surgeon: Burnell Blanks, MD;  Location: [facility] CATH LAB;  Service: Cardiovascular;  Laterality: N/A;  Proximal LAD  . TEE WITHOUT CARDIOVERSION N/A 02/15/2013   Procedure: TRANSESOPHAGEAL ECHOCARDIOGRAM (TEE);  Surgeon: Thayer Headings, MD;  Location: [place];  Service: Cardiovascular;  Laterality: N/A;  . TEE WITHOUT CARDIOVERSION N/A 08/24/2017   Procedure: TRANSESOPHAGEAL ECHOCARDIOGRAM (TEE);  Surgeon: Prescott Gum, Collier Salina, MD;  Location: [place];  Service: Open Heart  Surgery;  Laterality: N/A;  . TEE WITHOUT CARDIOVERSION N/A 08/31/2017   Procedure: TRANSESOPHAGEAL ECHOCARDIOGRAM (TEE);  Surgeon: Dorothy Spark, MD;  Location: [facility] ENDOSCOPY;  Service: Cardiovascular;  Laterality: N/A;    Current Outpatient Medications  Medication Sig Dispense Refill  . ACCU-CHEK AVIVA PLUS test strip USE UP TO 4 TIMES DAILY AS DIRECTED 120 each 0  . acetaminophen (TYLENOL) 650 MG CR tablet Take 650 mg by mouth every 8 (eight) hours as needed for pain.    Marland Kitchen albuterol (PROVENTIL HFA;VENTOLIN HFA) 108 (90 Base) MCG/ACT inhaler Inhale 2 puffs into the lungs every 4 (four) hours as needed for wheezing or shortness of breath.     Marland Kitchen amiodarone (PACERONE) 200 MG tablet Take 1 tablet (200 mg total) by mouth 2 (two) times daily. 60 tablet 3  . aspirin EC 81 MG tablet Take 81 mg by mouth daily.    Marland Kitchen atorvastatin (LIPITOR) 80 MG tablet Take 80 mg by mouth daily.    . blood glucose meter kit and supplies KIT Dispense based on patient and insurance preference. Use up to four times daily as directed. (FOR ICD-9 250.00, 250.01). 1 each 0  . diltiazem (CARDIZEM SR) 120 MG 12 hr capsule Take 1 capsule (120 mg total) by mouth every 12 (twelve) hours. 60 capsule 3  . docusate sodium (COLACE) 100 MG capsule Take 100 mg by mouth daily.   5  . DULoxetine (CYMBALTA) 60  MG capsule Take 60 mg by mouth every evening.    . furosemide (LASIX) 40 MG tablet Take 1 tablet (40 mg total) by mouth daily. 30 tablet 0  . gabapentin (NEURONTIN) 300 MG capsule Take 1 capsule (300 mg total) by mouth 2 (two) times daily. 60 capsule 0  . JARDIANCE 10 MG TABS tablet Take 10 mg by mouth daily.  3  . magnesium oxide (MAG-OX) 400 (241.3 MG) MG tablet Take 1 tablet (400 mg total) by mouth daily. (Patient taking differently: Take 250 mg by mouth daily. ) 30 tablet 11  . Metoprolol Tartrate 37.5 MG TABS Take 37.5 mg by mouth 2 (two) times daily.    . Multiple Vitamin (MULTIVITAMIN WITH MINERALS) TABS tablet Take 1  tablet by mouth daily.     . nitroGLYCERIN (NITROSTAT) 0.4 MG SL tablet PLACE 1 TABLET UNDER TONGUE EVERY 5 MINUTES AS NEEDED FOR CHEST PAIN MAX 3 TABLETS (Patient taking differently: Place 0.4 mg under the tongue every 5 (five) minutes as needed for chest pain. ) 25 tablet 3  . NON FORMULARY at bedtime. CPAP    . pantoprazole (PROTONIX) 40 MG tablet Take 40 mg by mouth daily.    Marland Kitchen POTASSIUM PO Take 1 tablet by mouth daily.    . rivaroxaban (XARELTO) 20 MG TABS tablet Take 1 tablet (20 mg total) daily with supper by mouth. 90 tablet 3   No current facility-administered medications for this encounter.     No Known Allergies  Social History   Socioeconomic History  . Marital status: Married    Spouse name: Not on file  . Number of children: Not on file  . Years of education: Not on file  . Highest education level: Not on file  Occupational History  . Not on file  Social Needs  . Financial resource strain: Not on file  . Food insecurity:    Worry: Not on file    Inability: Not on file  . Transportation needs:    Medical: Not on file    Non-medical: Not on file  Tobacco Use  . Smoking status: Former Smoker    Packs/day: 0.50    Years: 35.00    Pack years: 17.50    Types: Cigarettes    Last attempt to quit: 02/12/2013    Years since quitting: 4.6  . Smokeless tobacco: Never Used  Substance and Sexual Activity  . Alcohol use: No    Comment: 07/21/2014 "never drank much; might have a couple drinks/yr since MI 02/2013"  . Drug use: No  . Sexual activity: Yes    Birth control/protection: None  Lifestyle  . Physical activity:    Days per week: Not on file    Minutes per session: Not on file  . Stress: Not on file  Relationships  . Social connections:    Talks on phone: Not on file    Gets together: Not on file    Attends religious service: Not on file    Active member of club or organization: Not on file    Attends meetings of clubs or organizations: Not on file     Relationship status: Not on file  . Intimate partner violence:    Fear of current or ex partner: Not on file    Emotionally abused: Not on file    Physically abused: Not on file    Forced sexual activity: Not on file  Other Topics Concern  . Not on file  Social History Narrative  .  Not on file    Family History  Problem Relation Age of Onset  . Heart disease Mother   . Diabetes Mother   . Stroke Mother   . Diabetes Father     ROS- All systems are reviewed and negative except as per the HPI above.  Physical Exam: Vitals:   10/08/17 0925  BP: 106/68  Pulse: 92  Weight: 110.2 kg  Height: '5\' 7"'$  (1.702 m)    GEN- The patient is well appearing, alert and oriented x 3 today.   Head- normocephalic, atraumatic Eyes-  Sclera clear, conjunctiva pink Ears- hearing intact Oropharynx- clear Neck- supple  Lungs- Clear to ausculation bilaterally, normal work of breathing Heart- Irregular rate and rhythm  GI- soft, NT, ND, + BS Extremities- no clubbing, cyanosis, or edema MS- no significant deformity or atrophy Skin- no rash or lesion Psych- euthymic mood, full affect Neuro- strength and sensation are intact  Wt Readings from Last 3 Encounters:  10/08/17 110.2 kg  09/30/17 111.6 kg  09/25/17 112.7 kg    EKG today demonstrates typical atrial flutter, V rate 92  Epic records are reviewed at length today  Assessment and Plan:  1. Persistent atrial fibrillation/atrial flutter He had done well on Tikosyn until time of CABG. At that time, Tikosyn was changed to amiodarone 2/2 recurrent persistent arrhythmias. He has had ERAF post cardioversion last week. LA size by recent echo is 40. I think at this point it is most reasonable to consider ablation.   We discussed today and he is willing to talk about it further with Dr Rayann Heman. Will arrange for visit in the next couple of weeks For now, continue amiodarone '200mg'$  twice daily  Continue Xarelto for CHADS2VASC of 3  2. OSA He is  compliant with CPAP  3.  CAD s/p CABG Doing well Sternotomy well healed Has not yet started cardiac rehab    Follow up with Dr Rayann Heman 10/14 to discuss ablation  Chanetta Marshall, NP 10/08/2017 10:29 AM

## 2017-10-08 NOTE — Progress Notes (Signed)
Diabetes Self-Management Education  Visit Type: First/Initial  Appt. Start Time: 1100 Appt. End Time: 1220  10/08/2017  [name], identified by name and date of birth, is a 57 y.o. male with a diagnosis of Diabetes: Type 2.   ASSESSMENT Pt states he is s/p bypass surgery August 2019. Pt states he has restrictions on exercise. Per chart A1c is 7.4% as of 08/2017; 5.8% 4 yrs ago. Pt states he was not aware it was elevated 4 yrs ago. Pt states he was started on Jardiance, no mention of metformin. If it is contraindicated, RD provided education on other ways to help the body improve insulin sensitivity.  Pt shared information on his BG app and appears pre-meal numbers are well within target range, but his FBG tends to run a little high. Pt has greatly reduced carb intake, no potatoes, bread, rice, etc. Pt states at times he feels "blah" but continues to eat this way due to high motivation to control BG. Pt states his wife is very strict about how much carbs she serves him.  Pt states this is very different that how he was eating. Pt states his work required him to be away from home 3 weeks out of the month and hard to make good choices on the road. 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 RD suggested he talk with he doctors due to hx DVT.  OSA: Pt states he has been using cpap 4-5 yrs and was sleeping pretty well until recently fighting a-fib, pt states he was told they are consider ablation. Pt states although he is bummed out about his health issues the majority of his stress is related to home environment/family issues.   Pt states his activity started dropping off due to lower back issues and putting off surgery because he couldn't afford to be out of work for recovery period. 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 Pt states prior to surgery August his weight was ~260 lbs, now is 235 lbs this morning. The 25lb weight loss would indicate 9% weight loss in ~6 weeks. Weight loss is likely due to change in diet to very low carb.  Energy 6/10: Pt states he moves slow in the morning due to orthostatic hypotension that makes him light headed. Pt denies other low BG symptoms during other times.   Relevant medical history: S/p cabg X 3, Hx DVT, PE.  Diabetes Self-Management Education - 10/08/17 1115      Visit Information   Visit Type  First/Initial      Initial Visit   Diabetes Type  Type 2    Are you currently following a meal plan?  No    Are you taking your medications as prescribed?  Yes    Date Diagnosed  July 2019      Health Coping   How would you rate your overall health?  Fair      Psychosocial Assessment   Patient Belief/Attitude about Diabetes  Motivated to manage diabetes    How often do you need to have someone help you when you read instructions, pamphlets, or other written materials from your doctor or pharmacy?  1 - Never    What is the last grade level you completed in school?  12      Complications   Last HgB A1C per patient/outside source  7.7 %    How often do you check your blood sugar?  3-4 times/day    Fasting Blood glucose range (mg/dL)  70-129   100-130   Number of hypoglycemic episodes per month  0    Number of hyperglycemic episodes per week  0    Have you had a dilated eye exam in the past 12 months?  Yes    Are you checking your feet?  Yes    How many days per week are you checking your feet?  5      Dietary Intake   Breakfast  2 eggs, bacon occasionally, low carb tortilla,     Snack (morning)  none    Lunch  left overs OR tuna salad or chicken salad    Snack (afternoon)  none OR nuts OR apple    Dinner  protein, vegetables    Snack (evening)  none    Beverage(s)  water, coffee sometimes creamer      Exercise   Exercise Type  Light (walking /  raking leaves)    How many days per week to you exercise?  3    How many minutes per day do you exercise?  30    Total minutes per week of exercise  90      Patient Education   Previous Diabetes Education  No    Disease state   Definition of diabetes, type 1 and 2, and the diagnosis of diabetes    Nutrition management   Role of diet in the treatment of diabetes and the relationship between the three main macronutrients and blood glucose level    Physical activity and exercise   Role of exercise on diabetes management, blood pressure control and cardiac health.    Medications  Reviewed patients medication for diabetes, action, purpose, timing of dose and side effects.    Monitoring  Identified appropriate SMBG and/or A1C goals.    Acute complications  Taught treatment of hypoglycemia - the 15 rule.    Psychosocial adjustment  Role of stress on diabetes      Individualized Goals (developed by patient)   Nutrition  General guidelines for healthy choices and portions discussed    Monitoring   test my blood glucose as discussed    Reducing Risk  increase portions of nuts and seeds      Outcomes   Expected Outcomes  Demonstrated interest in learning. Expect positive outcomes    Future DMSE  PRN    Program Status  Completed      Individualized Plan for Diabetes Self-Management Training:   Learning Objective:  Patient will have a greater understanding of diabetes self-management. Patient education plan is to attend individual and/or group sessions per assessed needs and concerns.   Patient Instructions  Consider having a snack in the evening, something like apple with peanut butter, might bring down your morning blood sugar. Also think about the amount of saturated fat in your evening meal. Whole grains back into your diet may be a good idea, balance with protein an non-starchy vegetables. When eating carbs be sure to include protein. Idea for protein when having oatmeal would be some [place]  yogurt. Continue to check your blood sugar and use the numbers to understand how to make lifestyle changes. Consider looking into metformin and talk to your doctor to see if it is contraindicated. If you cannot take metformin, you  could try some apple cider vinegar diluted before meals with carbs, may help a little.  Expected Outcomes:  Demonstrated interest in learning. Expect positive outcomes  Education material provided: ADA Diabetes: Your Take Control Guide and A1C conversion sheet , Sleep Hygiene  If problems or questions, patient to contact team via:  Phone, MyChart  Future DSME appointment: PRN

## 2017-10-09 ENCOUNTER — Encounter (HOSPITAL_COMMUNITY): Payer: Self-pay

## 2017-10-09 ENCOUNTER — Emergency Department (HOSPITAL_COMMUNITY)
Admission: EM | Admit: 2017-10-09 | Discharge: 2017-10-09 | Disposition: A | Discharge status: Home / Self Care | Payer: BLUE CROSS/BLUE SHIELD | Attending: Emergency Medicine | Admitting: Emergency Medicine

## 2017-10-09 ENCOUNTER — Emergency Department (HOSPITAL_COMMUNITY): Payer: BLUE CROSS/BLUE SHIELD

## 2017-10-09 DIAGNOSIS — I2510 Atherosclerotic heart disease of native coronary artery without angina pectoris: Secondary | ICD-10-CM | POA: Insufficient documentation

## 2017-10-09 DIAGNOSIS — Z87891 Personal history of nicotine dependence: Secondary | ICD-10-CM | POA: Insufficient documentation

## 2017-10-09 DIAGNOSIS — Z79899 Other long term (current) drug therapy: Secondary | ICD-10-CM | POA: Diagnosis not present

## 2017-10-09 DIAGNOSIS — R079 Chest pain, unspecified: Secondary | ICD-10-CM | POA: Diagnosis not present

## 2017-10-09 DIAGNOSIS — Z7984 Long term (current) use of oral hypoglycemic drugs: Secondary | ICD-10-CM | POA: Diagnosis not present

## 2017-10-09 DIAGNOSIS — Z955 Presence of coronary angioplasty implant and graft: Secondary | ICD-10-CM | POA: Diagnosis not present

## 2017-10-09 DIAGNOSIS — I4892 Unspecified atrial flutter: Secondary | ICD-10-CM | POA: Diagnosis not present

## 2017-10-09 DIAGNOSIS — R0789 Other chest pain: Secondary | ICD-10-CM | POA: Insufficient documentation

## 2017-10-09 DIAGNOSIS — I10 Essential (primary) hypertension: Secondary | ICD-10-CM | POA: Insufficient documentation

## 2017-10-09 DIAGNOSIS — Z7982 Long term (current) use of aspirin: Secondary | ICD-10-CM | POA: Insufficient documentation

## 2017-10-09 DIAGNOSIS — E119 Type 2 diabetes mellitus without complications: Secondary | ICD-10-CM | POA: Diagnosis not present

## 2017-10-09 DIAGNOSIS — Z7901 Long term (current) use of anticoagulants: Secondary | ICD-10-CM | POA: Insufficient documentation

## 2017-10-09 DIAGNOSIS — Z951 Presence of aortocoronary bypass graft: Secondary | ICD-10-CM | POA: Diagnosis not present

## 2017-10-09 LAB — BASIC METABOLIC PANEL
ANION GAP: 11 (ref 5–15)
BUN: 17 mg/dL (ref 6–20)
CALCIUM: 9.4 mg/dL (ref 8.9–10.3)
CO2: 21 mmol/L — ABNORMAL LOW (ref 22–32)
Chloride: 107 mmol/L (ref 98–111)
Creatinine, Ser: 0.96 mg/dL (ref 0.61–1.24)
GFR calc non Af Amer: >60 mL/min (ref >60)
Glucose, Bld: 123 mg/dL — ABNORMAL HIGH (ref 70–99)
Potassium: 4.1 mmol/L (ref 3.5–5.1)
SODIUM: 139 mmol/L (ref 135–145)

## 2017-10-09 LAB — CBC
HCT: 40.6 % (ref 39.0–52.0)
HEMOGLOBIN: 11.9 g/dL — AB (ref 13.0–17.0)
MCH: 26.2 pg (ref 26.0–34.0)
MCHC: 29.3 g/dL — ABNORMAL LOW (ref 30.0–36.0)
MCV: 89.2 fL (ref 78.0–100.0)
PLATELETS: 367 10*3/uL (ref 150–400)
RBC: 4.55 MIL/uL (ref 4.22–5.81)
RDW: 14.1 % (ref 11.5–15.5)
WBC: 12.2 10*3/uL — AB (ref 4.0–10.5)

## 2017-10-09 LAB — I-STAT TROPONIN, ED
TROPONIN I, POC: 0 ng/mL (ref 0.00–0.08)
Troponin i, poc: 0 ng/mL (ref 0.00–0.08)

## 2017-10-09 LAB — D-DIMER, QUANTITATIVE (NOT AT ARMC): D DIMER QUANT: 1.19 ug{FEU}/mL — AB (ref 0.00–0.50)

## 2017-10-09 MED ORDER — IOPAMIDOL (ISOVUE-370) INJECTION 76%
100.0000 mL | Freq: Once | INTRAVENOUS | Status: AC
  Administered 2017-10-09: 100 mL via INTRAVENOUS

## 2017-10-09 MED ORDER — SODIUM CHLORIDE 0.9 % IV BOLUS
500.0000 mL | Freq: Once | INTRAVENOUS | Status: AC
  Administered 2017-10-09: 500 mL via INTRAVENOUS

## 2017-10-09 MED ORDER — IOPAMIDOL (ISOVUE-370) INJECTION 76%
INTRAVENOUS | Status: AC
  Filled 2017-10-09: qty 100

## 2017-10-09 NOTE — ED Notes (Signed)
IV team at bedside 

## 2017-10-09 NOTE — ED Notes (Signed)
IV team consulted, unsuccessful IV attempts x2.

## 2017-10-09 NOTE — ED Notes (Signed)
Lab to add on d-dimer to blue top that was sent down during triage

## 2017-10-09 NOTE — ED Provider Notes (Signed)
[facility] EMERGENCY DEPARTMENT Provider Note   CSN: [id number] Arrival date & time: 10/09/17  0753     History   Chief Complaint Chief Complaint  Patient presents with  . Chest Pain    HPI Paul Todd is a 57 y.o. male.  Patient with history of DVT/PE on Xarelto, previous MI requiring stenting as well as CABG performed on 08/24/2017 after an STEMI for left main disease, flutter status post 4 previous cardioversionsatrial --presents with complaint of chest pain with radiation to the right arm and shoulder starting yesterday while at rest.  Pain is in the right chest.  It is worse with movement and palpation of the right lateral chest wall.  It also hurts more when he takes a deep breath.  Patient took Tylenol without relief.  He denies any significant shortness of breath.  It does not feel like previous heart attacks.  No fevers or cough.  No vomiting or diaphoresis.  He is compliant with his medications.  No syncope or lightheadedness.  Symptoms have been constant since yesterday evening.  Patient wanted to get checked given his previous medical problems and surgery.  Patient is in atrial flutter today.      Past Medical History:  Diagnosis Date  . Arthritis       . Atrial flutter ([place])   . CAD (coronary artery disease)    multivessel per cath 02/12/13, s/p stenting to RCA  . Chronic lower back pain   . Diabetes mellitus without complication ([place])   . Dyslipidemia   . History of kidney stones   . Hx of Bell's palsy   . Hypertension   . Myocardial infarction ([place]) 02/12/2013   . Nephrolithiasis   . Neuromuscular disorder ([facility])    Bell's Palsy  . OSA on CPAP   . PE (pulmonary thromboembolism) ([place]) 02/2014  . Persistent atrial fibrillation         Patient Active Problem List   Diagnosis Date Noted  . Newly diagnosed diabetes ([place]) 10/08/2017  . Typical atrial flutter ([place])   . Type 2 diabetes mellitus without complication, without long-term current  use of insulin ([place]) 08/27/2017  . History of DVT (deep vein thrombosis) 08/27/2017  . History of pulmonary embolus (PE) 08/27/2017  . 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 Coronary artery disease involving native coronary artery of native heart with unstable angina pectoris ([place])   . PAD (peripheral artery disease) ([place]) 04/07/2017  . Ischemic cardiomyopathy 11/25/2016  . Mixed hyperlipidemia 11/25/2016  . Pre-operative cardiovascular examination 08/23/2015  . PVC (premature ventricular contraction) 07/24/2014  . Encounter for cardioversion procedure   . Persistent atrial fibrillation   . Pulmonary embolus ([place]) 06/20/2014  . Back pain 02/21/2014  . Chest pain with high risk for cardiac etiology 09/05/2013  . Nephrolithiasis 09/05/2013  . Hematuria, gross 09/05/2013  . Nausea and vomiting 09/05/2013  . Nausea & vomiting   . Old myocardial infarction   . Emesis 09/02/2013  . OSA (obstructive sleep apnea) 07/22/2013  . Obesity (BMI 35.0-39.9 without comorbidity) 03/07/2013  . Unstable angina - Post Infarct Angina with existing LAD disease 02/14/2013  . CAD S/P percutaneous coronary angioplasty - DES to Mid LAD; 2x 98m x 38 mm Xience DES to RCA 02/14/2013  . Dyslipidemia 02/12/2013  . NSTEMI (non-ST elevated myocardial infarction) ([place] 01/30/2013  . Tobacco abuse 01/30/2013  . Fatigue 01/30/2013  . PAF (paroxysmal atrial fibrillation) ([place] 01/30/2013    Past Surgical History:  Procedure Laterality  Date  . ABDOMINAL AORTOGRAM W/LOWER EXTREMITY N/A 01/26/2017   Procedure: ABDOMINAL AORTOGRAM W/LOWER EXTREMITY;  Surgeon: Angelia Mould, MD;  Location: [facility] LAB;  Service: Cardiovascular;  Laterality: N/A;  . BACK SURGERY  2017   laminectomy  . CARDIOVERSION N/A 02/15/2013   Procedure: CARDIOVERSION;  Surgeon: Thayer Headings, MD;  Location: [facility];  Service: Cardiovascular;  Laterality: N/A;  . CARDIOVERSION N/A 07/07/2014   Procedure: CARDIOVERSION;  Surgeon: Pixie Casino, MD;  Location: [facility] ENDOSCOPY;  Service: Cardiovascular;  Laterality: N/A;  . CARDIOVERSION N/A 08/31/2017   Procedure: CARDIOVERSION;  Surgeon: Dorothy Spark, MD;  Location: [facility] ENDOSCOPY;  Service: Cardiovascular;  Laterality: N/A;  . CARDIOVERSION N/A 09/29/2017   Procedure: CARDIOVERSION;  Surgeon: Sanda Klein, MD;  Location: [facility];  Service: Cardiovascular;  Laterality: N/A;  . CORONARY ANGIOPLASTY WITH STENT PLACEMENT  02/12/2013  . CORONARY ARTERY BYPASS GRAFT N/A 08/24/2017   Procedure: CORONARY ARTERY BYPASS GRAFTING (CABG) X3. RIGHT ENDOSCOPIC SAPHENOUS VEIN HARVEST. MAMM TO LAD, SVG TO OM, SVG TO RAMUS;  Surgeon: Ivin Poot, MD;  Location: [facility];  Service: Open Heart Surgery;  Laterality: N/A;  . CYSTOSCOPY WITH RETROGRADE PYELOGRAM, URETEROSCOPY AND STENT PLACEMENT Left 09/02/2013   Procedure: CYSTOSCOPY WITH RETROGRADE PYELOGRAM/LEFT URETEROSCOPY/URETERAL STENT, with fulguration;  Surgeon: Festus Aloe, MD;  Location: [facility];  Service: Urology;  Laterality: Left;  . CYSTOSCOPY WITH URETEROSCOPY AND STENT PLACEMENT Left 09/16/2013   Procedure: CYSTOSCOPY WITH URETEROSCOPY AND STENT PLACEMENT;  Surgeon: Festus Aloe, MD;  Location: [facility];  Service: Urology;  Laterality: Left;  . FEMORAL BYPASS Left 04/07/2017   below the knee popliteal  . FEMORAL-POPLITEAL BYPASS GRAFT Left 04/07/2017   Procedure: LEFT FEMORAL-BELOW KNEE POPLITEAL ARTERY BYPASS USING COMPOSITE LEFT GREATER SAPHENOUS VEIN;  Surgeon: Angelia Mould, MD;  Location: [facility];  Service: Vascular;  Laterality: Left;  . HOLMIUM LASER APPLICATION Left 6/38/7564   Procedure: HOLMIUM LASER APPLICATION;  Surgeon: Festus Aloe, MD;  Location: [facility];  Service: Urology;  Laterality: Left;  . INTRAVASCULAR PRESSURE WIRE/FFR STUDY N/A 08/21/2017   Procedure: INTRAVASCULAR PRESSURE WIRE/FFR STUDY;  Surgeon: Sherren Mocha, MD;  Location: [facility] CV LAB;  Service: Cardiovascular;  Laterality: N/A;    . LEFT HEART CATH AND CORONARY ANGIOGRAPHY N/A 08/21/2017   Procedure: LEFT HEART CATH AND CORONARY ANGIOGRAPHY;  Surgeon: Sherren Mocha, MD;  Location: [facility] CV LAB;  Service: Cardiovascular;  Laterality: N/A;  . LEFT HEART CATHETERIZATION WITH CORONARY ANGIOGRAM N/A 02/12/2013   Procedure: LEFT HEART CATHETERIZATION WITH CORONARY ANGIOGRAM;  Surgeon: Troy Sine, MD;  Location: [facility] CATH LAB;  Service: Cardiovascular;  Laterality: N/A;  . PERCUTANEOUS CORONARY STENT INTERVENTION (PCI-S)  02/12/2013   Procedure: PERCUTANEOUS CORONARY STENT INTERVENTION (PCI-S);  Surgeon: Troy Sine, MD;  Location: [facility] CATH LAB;  Service: Cardiovascular;;  STEMI  . PERCUTANEOUS CORONARY STENT INTERVENTION (PCI-S) N/A 02/14/2013   Procedure: PERCUTANEOUS CORONARY STENT INTERVENTION (PCI-S);  Surgeon: Burnell Blanks, MD;  Location: [facility] CATH LAB;  Service: Cardiovascular;  Laterality: N/A;  Proximal LAD  . TEE WITHOUT CARDIOVERSION N/A 02/15/2013   Procedure: TRANSESOPHAGEAL ECHOCARDIOGRAM (TEE);  Surgeon: Thayer Headings, MD;  Location: [facility];  Service: Cardiovascular;  Laterality: N/A;  . TEE WITHOUT CARDIOVERSION N/A 08/24/2017   Procedure: TRANSESOPHAGEAL ECHOCARDIOGRAM (TEE);  Surgeon: Prescott Gum, Collier Salina, MD;  Location: [facility];  Service: Open Heart Surgery;  Laterality: N/A;  . TEE WITHOUT CARDIOVERSION N/A 08/31/2017   Procedure: TRANSESOPHAGEAL ECHOCARDIOGRAM (TEE);  Surgeon: Dorothy Spark, MD;  Location: [facility] ENDOSCOPY;  Service: Cardiovascular;  Laterality: N/A;     OB History   None      Home Medications    Prior to Admission medications   Medication Sig Start Date End Date Taking? Authorizing Provider  ACCU-CHEK AVIVA PLUS test strip USE UP TO 4 TIMES DAILY AS DIRECTED 09/17/17   Barrett, Lodema Hong, PA-C  acetaminophen (TYLENOL) 650 MG CR tablet Take 650 mg by mouth every 8 (eight) hours as needed for pain.    [provider]  albuterol (PROVENTIL HFA;VENTOLIN HFA) 108 (90  Base) MCG/ACT inhaler Inhale 2 puffs into the lungs every 4 (four) hours as needed for wheezing or shortness of breath.  08/17/17   [provider]  amiodarone (PACERONE) 200 MG tablet Take 1 tablet (200 mg total) by mouth 2 (two) times daily. 09/03/17   Barrett, Lodema Hong, PA-C  aspirin EC 81 MG tablet Take 81 mg by mouth daily.    [provider]  atorvastatin (LIPITOR) 80 MG tablet Take 80 mg by mouth daily.    [provider]  blood glucose meter kit and supplies KIT Dispense based on patient and insurance preference. Use up to four times daily as directed. (FOR ICD-9 250.00, 250.01). 09/02/17   Barrett, Erin R, PA-C  diltiazem (CARDIZEM SR) 120 MG 12 hr capsule Take 1 capsule (120 mg total) by mouth every 12 (twelve) hours. 09/02/17   Barrett, Erin R, PA-C  docusate sodium (COLACE) 100 MG capsule Take 100 mg by mouth daily.  01/08/17   [provider]  DULoxetine (CYMBALTA) 60 MG capsule Take 60 mg by mouth every evening.    [provider]  furosemide (LASIX) 40 MG tablet Take 1 tablet (40 mg total) by mouth daily. 09/03/17   Barrett, Erin R, PA-C  gabapentin (NEURONTIN) 300 MG capsule Take 1 capsule (300 mg total) by mouth 2 (two) times daily. 09/30/17   Ivin Poot, MD  JARDIANCE 10 MG TABS tablet Take 10 mg by mouth daily. 08/10/17   [provider]  magnesium oxide (MAG-OX) 400 (241.3 MG) MG tablet Take 1 tablet (400 mg total) by mouth daily. Patient taking differently: Take 250 mg by mouth daily.  07/25/14   Chanetta Marshall K, NP  Metoprolol Tartrate 37.5 MG TABS Take 37.5 mg by mouth 2 (two) times daily. 09/02/17   Barrett, Erin R, PA-C  Multiple Vitamin (MULTIVITAMIN WITH MINERALS) TABS tablet Take 1 tablet by mouth daily.     [provider]  nitroGLYCERIN (NITROSTAT) 0.4 MG SL tablet PLACE 1 TABLET UNDER TONGUE EVERY 5 MINUTES AS NEEDED FOR CHEST PAIN MAX 3 TABLETS Patient taking differently: Place 0.4 mg under the tongue every 5  (five) minutes as needed for chest pain.  07/20/17   Hilty, Nadean Corwin, MD  NON FORMULARY at bedtime. CPAP    [provider]  pantoprazole (PROTONIX) 40 MG tablet Take 40 mg by mouth daily.    [provider]  POTASSIUM PO Take 1 tablet by mouth daily.    [provider]  rivaroxaban (XARELTO) 20 MG TABS tablet Take 1 tablet (20 mg total) daily with supper by mouth. 11/24/16   Hilty, Nadean Corwin, MD    Family History Family History  Problem Relation Age of Onset  . Heart disease Mother   . Diabetes Mother   . Stroke Mother   . Diabetes Father     Social History Social History  Tobacco Use  . Smoking status: Former Smoker    Packs/day: 0.50    Years: 35.00    Pack years: 17.50    Types: Cigarettes    Last attempt to quit: 02/12/2013    Years since quitting: 4.6  . Smokeless tobacco: Never Used  Substance Use Topics  . Alcohol use: No    Comment: 07/21/2014 "never drank much; might have a couple drinks/yr since MI 02/2013"  . Drug use: No     Allergies   Patient has no known allergies.   Review of Systems Review of Systems  Constitutional: Negative for diaphoresis and fever.  Eyes: Negative for redness.  Respiratory: Negative for cough and shortness of breath.   Cardiovascular: Positive for chest pain. Negative for palpitations and leg swelling.  Gastrointestinal: Negative for abdominal pain, nausea and vomiting.  Genitourinary: Negative for dysuria.  Musculoskeletal: Negative for back pain and neck pain.  Skin: Negative for rash.  Neurological: Negative for syncope and light-headedness.  Psychiatric/Behavioral: The patient is not nervous/anxious.      Physical Exam Updated Vital Signs BP 121/70 (BP Location: Right Arm)   Pulse 69   Temp 98 F (36.7 C) (Oral)   Resp 18   SpO2 95%   Physical Exam  Constitutional: He appears well-developed and well-nourished.  HENT:  Head: Normocephalic and atraumatic.  Mouth/Throat: Oropharynx is  clear and moist and mucous membranes are normal. Mucous membranes are not dry.  Eyes: Conjunctivae are normal.  Neck: Trachea normal and normal range of motion. Neck supple. Normal carotid pulses and no JVD present. No muscular tenderness present. Carotid bruit is not present. No tracheal deviation present.  Cardiovascular: Normal rate, S1 normal, S2 normal, normal heart sounds and intact distal pulses. An irregularly irregular rhythm present. Exam reveals no distant heart sounds and no decreased pulses.  No murmur heard. Pulmonary/Chest: Effort normal and breath sounds normal. No respiratory distress. He has no wheezes. He exhibits tenderness (Palpation to the right anterior and lateral chest wall below the nipple line reproduces the pain.).  Abdominal: Soft. Normal aorta and bowel sounds are normal. There is no tenderness. There is no rebound and no guarding.  Musculoskeletal: He exhibits no edema.  Neurological: He is alert.  Skin: Skin is warm and dry. He is not diaphoretic. No cyanosis. No pallor.  Psychiatric: He has a normal mood and affect.  Nursing note and vitals reviewed.    ED Treatments / Results  Labs (all labs ordered are listed, but only abnormal results are displayed) Labs Reviewed  CBC - Abnormal; Notable for the following components:      Result Value   WBC 12.2 (*)    Hemoglobin 11.9 (*)    MCHC 29.3 (*)    All other components within normal limits  BASIC METABOLIC PANEL  D-DIMER, QUANTITATIVE (NOT AT [facility])  I-STAT TROPONIN, ED    ED ECG REPORT   Date: 10/09/2017  Rate: 80  Rhythm: atrial flutter with variable AV block  QRS Axis: right  Intervals: normal  ST/T Wave abnormalities: nonspecific T wave changes  Conduction Disutrbances:right bundle branch block, incomplete  Narrative Interpretation:   Old EKG Reviewed: unchanged  I have personally reviewed the EKG tracing and agree with the computerized printout as noted.  Radiology Dg Chest 2  View  Result Date: 10/09/2017 CLINICAL DATA:  Recent CABG.  Chest pain beginning last night. EXAM: CHEST - 2 VIEW COMPARISON:  09/30/2017 FINDINGS: Previous median sternotomy and CABG. Heart size upper limits of  normal. Mediastinal shadows otherwise normal. The lungs are clear. Vascularity is normal. No effusions. No significant bone finding. IMPRESSION: Previous CABG.  No active disease. Electronically Signed   By: Nelson Chimes M.D.   On: 10/09/2017 08:21    Procedures Procedures (including critical care time)  Medications Ordered in ED Medications - No data to display   Initial Impression / Assessment and Plan / ED Course  I have reviewed the triage vital signs and the nursing notes.  Pertinent labs & imaging results that were available during my care of the patient were reviewed by me and considered in my medical decision making (see chart for details).     Patient seen and examined. Work-up initiated. Medications ordered. D-dimer added given recent surgery, h/o PE although symptoms seem to be reliably reproducible.   Vital signs reviewed and are as follows: BP 121/70 (BP Location: Right Arm)   Pulse 69   Temp 98 F (36.7 C) (Oral)   Resp 18   SpO2 95%   Patient seen by Dr. Francia Greaves, agrees symptoms are reproducible.     D-dimer was elevated.  CT Angio of the chest performed which was negative for blood clot or other acute problems.  Patient has some postsurgical changes from his surgery.  Delta troponin was negative.  Comfortable with discharged home at this time.  Patient to treat symptoms conservatively, return with worsening and follow-up with his doctor as planned.  Patient was counseled to return with severe chest pain, especially if the pain is crushing or pressure-like and spreads to the arms, back, neck, or jaw, or if they have sweating, nausea, or shortness of breath with the pain. They were encouraged to call 911 with these symptoms.   They were also told to return  if their chest pain gets worse and does not go away with rest, they have an attack of chest pain lasting longer than usual despite rest and treatment with the medications their caregiver has prescribed, if they wake from sleep with chest pain or shortness of breath, if they feel dizzy or faint, if they have chest pain not typical of their usual pain, or if they have any other emergent concerns regarding their health.  The patient verbalized understanding and agreed.    Final Clinical Impressions(s) / ED Diagnoses   Final diagnoses:  Atypical chest pain  Atrial flutter, unspecified type [facility])   Patient with chest pain, recent CABG.  Pain is reproducible on exam suggesting chest wall pain.  However work-up undertaken given multiple previous problems such as ACS and DVT/PE.  No PE noted on CT of the chest.  No pneumothorax or pneumonia noted.  Delta troponin negative here with reassuring EKGs.  Feel patient is low risk for ACS given history (poor story for ACS/MI), negative troponin(s), normal/unchanged EKG.    ED Discharge Orders    None       Carlisle Cater, Hershal Coria 10/09/17 1246    Valarie Merino, MD 10/10/17 1151

## 2017-10-09 NOTE — Discharge Instructions (Signed)
Please read and follow all provided instructions.  Your diagnoses today include:  1. Atypical chest pain   2. Atrial flutter, unspecified type ([place])     Tests performed today include:  An EKG of your heart  A chest x-ray  Cardiac enzymes - a blood test for heart muscle damage  Blood counts and electrolytes  Screening test for blood clot that was abnormal however your chest CT did not show any signs of blood clot or other acute problems such as infection that would explain your pain.  Vital signs. See below for your results today.   Medications prescribed:   None  Take any prescribed medications only as directed.  Follow-up instructions: Please follow-up with your primary care provider as soon as you can for further evaluation of your symptoms.   Use Tylenol for pain at home and heat on area as needed.  Return instructions:  SEEK IMMEDIATE MEDICAL ATTENTION IF:  You have severe chest pain, especially if the pain is crushing or pressure-like and spreads to the arms, back, neck, or jaw, or if you have sweating, nausea (feeling sick to your stomach), or shortness of breath. THIS IS AN EMERGENCY. Don't wait to see if the pain will go away. Get medical help at once. Call 911 or 0 (operator). DO NOT drive yourself to the hospital.   Your chest pain gets worse and does not go away with rest.   You have an attack of chest pain lasting longer than usual, despite rest and treatment with the medications your caregiver has prescribed.   You wake from sleep with chest pain or shortness of breath.  You feel dizzy or faint.  You have chest pain not typical of your usual pain for which you originally saw your caregiver.   You have any other emergent concerns regarding your health.  Additional Information: Chest pain comes from many different causes. Your caregiver has diagnosed you as having chest pain that is not specific for one problem, but does not require admission.    Your  vital signs today were: BP 121/69    Pulse 64    Temp 98 F (36.7 C) (Oral)    Resp (!) 24    SpO2 99%  If your blood pressure (BP) was elevated above 135/85 this visit, please have this repeated by your doctor within one month. --------------

## 2017-10-09 NOTE — ED Triage Notes (Addendum)
Pt presents for evaluation of R sided chest pain starting yesterday with radiation to R arm, neck and back. States had CABG 7 weeks ago. States its difficult to take a deep breath because of pain. Hx of PE, on xarelto but has had blood clot while on anticoagulation before.

## 2017-10-16 ENCOUNTER — Encounter: Payer: Self-pay | Admitting: Internal Medicine

## 2017-10-17 ENCOUNTER — Other Ambulatory Visit: Payer: Self-pay | Admitting: Physician Assistant

## 2017-10-19 ENCOUNTER — Ambulatory Visit (INDEPENDENT_AMBULATORY_CARE_PROVIDER_SITE_OTHER): Payer: BLUE CROSS/BLUE SHIELD | Admitting: Internal Medicine

## 2017-10-19 ENCOUNTER — Encounter: Payer: Self-pay | Admitting: Internal Medicine

## 2017-10-19 ENCOUNTER — Telehealth (HOSPITAL_COMMUNITY): Payer: Self-pay

## 2017-10-19 VITALS — BP 102/60 | HR 70 | Ht 67.0 in | Wt 244.6 lb

## 2017-10-19 DIAGNOSIS — Z951 Presence of aortocoronary bypass graft: Secondary | ICD-10-CM

## 2017-10-19 DIAGNOSIS — I25810 Atherosclerosis of coronary artery bypass graft(s) without angina pectoris: Secondary | ICD-10-CM

## 2017-10-19 DIAGNOSIS — I483 Typical atrial flutter: Secondary | ICD-10-CM | POA: Diagnosis not present

## 2017-10-19 DIAGNOSIS — I4819 Other persistent atrial fibrillation: Secondary | ICD-10-CM

## 2017-10-19 DIAGNOSIS — G4733 Obstructive sleep apnea (adult) (pediatric): Secondary | ICD-10-CM

## 2017-10-19 DIAGNOSIS — I4891 Unspecified atrial fibrillation: Secondary | ICD-10-CM

## 2017-10-19 DIAGNOSIS — Z9989 Dependence on other enabling machines and devices: Secondary | ICD-10-CM

## 2017-10-19 MED ORDER — AMIODARONE HCL 200 MG PO TABS: 200.0000 mg | ORAL_TABLET | Freq: Every day | ORAL | 3 refills | Status: DC

## 2017-10-19 NOTE — Progress Notes (Signed)
Electrophysiology Office Note   Date:  10/19/2017   ID:  Paul Todd, DOB 11/30/1960, MRN [id number]  PCP:  Everardo Beals, NP  Cardiologist:  Dr Debara Pickett Primary Electrophysiologist: Thompson Grayer, MD    CC: afib/ atrial flutter   History of Present Illness: Paul Todd is a 57 y.o. male who presents today for electrophysiology evaluation.   He has a h/o persistent afib and typical atrial flutter.  I have not seen him since 2016.  He is recently s/p CABG, without MAZE.  He has had increasing atrial arrhythmias.  He has fatigue and lightheadedness with his atrial arrhythmias.  He had done well with tikosyn previously.  After his recent CABG in August, he has been placed on amiodarone.  He has required cardioversion 2/15, 7/16, 8/19, and 9/19.  He is back in atrial flutter again today.  + palpitations and fatigue. Today, he denies symptoms of palpitations, chest pain, shortness of breath, orthopnea, PND, lower extremity edema, claudication, dizziness, presyncope, syncope, bleeding, or neurologic sequela. The patient is tolerating medications without difficulties and is otherwise without complaint today.    Past Medical History:  Diagnosis Date  . Arthritis       . Atrial flutter ([place])   . CAD (coronary artery disease)    multivessel per cath 02/12/13, s/p stenting to RCA  . Chronic lower back pain   . Diabetes mellitus without complication ([place])   . Dyslipidemia   . History of kidney stones   . Hx of Bell's palsy   . Hypertension   . Myocardial infarction ([place]) 02/12/2013   . Nephrolithiasis   . Neuromuscular disorder ([facility])    Bell's Palsy  . OSA on CPAP   . PE (pulmonary thromboembolism) ([place]) 02/2014  . Persistent atrial fibrillation        Past Surgical History:  Procedure Laterality Date  . ABDOMINAL AORTOGRAM W/LOWER EXTREMITY N/A 01/26/2017   Procedure: ABDOMINAL AORTOGRAM W/LOWER EXTREMITY;  Surgeon: Angelia Mould, MD;  Location:  CV LAB;   Service: Cardiovascular;  Laterality: N/A;  . BACK SURGERY  2017   laminectomy  . CARDIOVERSION N/A 02/15/2013   Procedure: CARDIOVERSION;  Surgeon: Thayer Headings, MD;  Location: [place];  Service: Cardiovascular;  Laterality: N/A;  . CARDIOVERSION N/A 07/07/2014   Procedure: CARDIOVERSION;  Surgeon: Pixie Casino, MD;  Location: [facility] ENDOSCOPY;  Service: Cardiovascular;  Laterality: N/A;  . CARDIOVERSION N/A 08/31/2017   Procedure: CARDIOVERSION;  Surgeon: Dorothy Spark, MD;  Location: [facility] ENDOSCOPY;  Service: Cardiovascular;  Laterality: N/A;  . CARDIOVERSION N/A 09/29/2017   Procedure: CARDIOVERSION;  Surgeon: Sanda Klein, MD;  Location: [place];  Service: Cardiovascular;  Laterality: N/A;  . CORONARY ANGIOPLASTY WITH STENT PLACEMENT  02/12/2013  . CORONARY ARTERY BYPASS GRAFT N/A 08/24/2017   Procedure: CORONARY ARTERY BYPASS GRAFTING (CABG) X3. RIGHT ENDOSCOPIC SAPHENOUS VEIN HARVEST. MAMM TO LAD, SVG TO OM, SVG TO RAMUS;  Surgeon: Ivin Poot, MD;  Location: [place];  Service: Open Heart Surgery;  Laterality: N/A;  . CYSTOSCOPY WITH RETROGRADE PYELOGRAM, URETEROSCOPY AND STENT PLACEMENT Left 09/02/2013   Procedure: CYSTOSCOPY WITH RETROGRADE PYELOGRAM/LEFT URETEROSCOPY/URETERAL STENT, with fulguration;  Surgeon: Festus Aloe, MD;  Location: [facility];  Service: Urology;  Laterality: Left;  . CYSTOSCOPY WITH URETEROSCOPY AND STENT PLACEMENT Left 09/16/2013   Procedure: CYSTOSCOPY WITH URETEROSCOPY AND STENT PLACEMENT;  Surgeon: Festus Aloe, MD;  Location: [facility];  Service: Urology;  Laterality: Left;  . FEMORAL BYPASS Left 04/07/2017  below the knee popliteal  . FEMORAL-POPLITEAL BYPASS GRAFT Left 04/07/2017   Procedure: LEFT FEMORAL-BELOW KNEE POPLITEAL ARTERY BYPASS USING COMPOSITE LEFT GREATER SAPHENOUS VEIN;  Surgeon: Angelia Mould, MD;  Location: [place];  Service: Vascular;  Laterality: Left;  . HOLMIUM LASER APPLICATION Left 09/21/3844   Procedure: HOLMIUM  LASER APPLICATION;  Surgeon: Festus Aloe, MD;  Location: [facility];  Service: Urology;  Laterality: Left;  . INTRAVASCULAR PRESSURE WIRE/FFR STUDY N/A 08/21/2017   Procedure: INTRAVASCULAR PRESSURE WIRE/FFR STUDY;  Surgeon: Sherren Mocha, MD;  Location: [place] CV LAB;  Service: Cardiovascular;  Laterality: N/A;  . LEFT HEART CATH AND CORONARY ANGIOGRAPHY N/A 08/21/2017   Procedure: LEFT HEART CATH AND CORONARY ANGIOGRAPHY;  Surgeon: Sherren Mocha, MD;  Location: [place] [facility];  Service: Cardiovascular;  Laterality: N/A;  . LEFT HEART CATHETERIZATION WITH CORONARY ANGIOGRAM N/A 02/12/2013   Procedure: LEFT HEART CATHETERIZATION WITH CORONARY ANGIOGRAM;  Surgeon: Troy Sine, MD;  Location: [facility] CATH LAB;  Service: Cardiovascular;  Laterality: N/A;  . PERCUTANEOUS CORONARY STENT INTERVENTION (PCI-S)  02/12/2013   Procedure: PERCUTANEOUS CORONARY STENT INTERVENTION (PCI-S);  Surgeon: Troy Sine, MD;  Location: [facility] CATH LAB;  Service: Cardiovascular;;  STEMI  . PERCUTANEOUS CORONARY STENT INTERVENTION (PCI-S) N/A 02/14/2013   Procedure: PERCUTANEOUS CORONARY STENT INTERVENTION (PCI-S);  Surgeon: Burnell Blanks, MD;  Location: [facility] CATH LAB;  Service: Cardiovascular;  Laterality: N/A;  Proximal LAD  . TEE WITHOUT CARDIOVERSION N/A 02/15/2013   Procedure: TRANSESOPHAGEAL ECHOCARDIOGRAM (TEE);  Surgeon: Thayer Headings, MD;  Location: [place];  Service: Cardiovascular;  Laterality: N/A;  . TEE WITHOUT CARDIOVERSION N/A 08/24/2017   Procedure: TRANSESOPHAGEAL ECHOCARDIOGRAM (TEE);  Surgeon: Prescott Gum, Collier Salina, MD;  Location: [place];  Service: Open Heart Surgery;  Laterality: N/A;  . TEE WITHOUT CARDIOVERSION N/A 08/31/2017   Procedure: TRANSESOPHAGEAL ECHOCARDIOGRAM (TEE);  Surgeon: Dorothy Spark, MD;  Location: [facility] ENDOSCOPY;  Service: Cardiovascular;  Laterality: N/A;     Current Outpatient Medications  Medication Sig Dispense Refill  . ACCU-CHEK AVIVA PLUS test strip USE UP TO 4  TIMES DAILY AS DIRECTED 120 each 0  . ACCU-CHEK SOFTCLIX LANCETS lancets USE UP TO 4 TIMES DAILY AS DIRECTED 100 each 0  . acetaminophen (TYLENOL) 650 MG CR tablet Take 1,300 mg by mouth 2 (two) times daily.     Marland Kitchen albuterol (PROVENTIL HFA;VENTOLIN HFA) 108 (90 Base) MCG/ACT inhaler Inhale 2 puffs into the lungs every 4 (four) hours as needed for wheezing or shortness of breath.     Marland Kitchen amiodarone (PACERONE) 200 MG tablet Take 1 tablet (200 mg total) by mouth 2 (two) times daily. 60 tablet 3  . aspirin EC 81 MG tablet Take 81 mg by mouth daily.    Marland Kitchen atorvastatin (LIPITOR) 80 MG tablet Take 80 mg by mouth daily.    . blood glucose meter kit and supplies KIT Dispense based on patient and insurance preference. Use up to four times daily as directed. (FOR ICD-9 250.00, 250.01). 1 each 0  . diltiazem (CARDIZEM SR) 120 MG 12 hr capsule Take 1 capsule (120 mg total) by mouth every 12 (twelve) hours. 60 capsule 3  . docusate sodium (COLACE) 100 MG capsule Take 100 mg by mouth daily.   5  . DULoxetine (CYMBALTA) 60 MG capsule Take 60 mg by mouth every evening.    . gabapentin (NEURONTIN) 300 MG capsule Take 1 capsule (300 mg total) by mouth 2 (two) times daily. 60 capsule 0  . JARDIANCE  10 MG TABS tablet Take 10 mg by mouth daily.  3  . magnesium oxide (MAG-OX) 400 (241.3 MG) MG tablet Take 1 tablet (400 mg total) by mouth daily. (Patient taking differently: Take 250 mg by mouth daily. ) 30 tablet 11  . Metoprolol Tartrate 37.5 MG TABS Take 37.5 mg by mouth 2 (two) times daily.    . Multiple Vitamin (MULTIVITAMIN WITH MINERALS) TABS tablet Take 1 tablet by mouth daily.     . nitroGLYCERIN (NITROSTAT) 0.4 MG SL tablet PLACE 1 TABLET UNDER TONGUE EVERY 5 MINUTES AS NEEDED FOR CHEST PAIN MAX 3 TABLETS (Patient taking differently: Place 0.4 mg under the tongue every 5 (five) minutes as needed for chest pain. ) 25 tablet 3  . NON FORMULARY at bedtime. CPAP    . pantoprazole (PROTONIX) 40 MG tablet Take 40 mg by  mouth daily.    . Potassium 99 MG TABS Take 1 tablet by mouth daily.     . rivaroxaban (XARELTO) 20 MG TABS tablet Take 1 tablet (20 mg total) daily with supper by mouth. 90 tablet 3   No current facility-administered medications for this visit.     Allergies:   Patient has no known allergies.   Social History:  The patient  reports that he quit smoking about 4 years ago. His smoking use included cigarettes. He has a 17.50 pack-year smoking history. He has never used smokeless tobacco. He reports that he does not drink alcohol or use drugs.   Family History:  The patient's  family history includes Diabetes in his father and mother; Heart disease in his mother; Stroke in his mother.    ROS:  Please see the history of present illness.   All other systems are personally reviewed and negative.    PHYSICAL EXAM: VS:  BP 102/60   Pulse 70   Ht '5\' 7"'$  (1.702 m)   Wt 244 lb 9.6 oz (110.9 kg)   SpO2 98%   BMI 38.31 kg/m  , BMI Body mass index is 38.31 kg/m. GEN: Well nourished, well developed, in no acute distress  HEENT: normal  Neck: no JVD, carotid bruits, or masses Cardiac: iRRR; no murmurs, rubs, or gallops,no edema  Respiratory:  clear to auscultation bilaterally, normal work of breathing GI: soft, nontender, nondistended, + BS MS: no deformity or atrophy  Skin: warm and dry  Neuro:  Strength and sensation are intact Psych: euthymic mood, full affect  EKG:  EKG is ordered today. The ekg ordered today is personally reviewed and shows typical atrial flutter, V rate 70 bpm, poor r wave progression   Recent Labs: 08/22/2017: ALT 41; TSH 0.819 09/02/2017: Magnesium 2.0 10/09/2017: BUN 17; Creatinine, Ser 0.96; Hemoglobin 11.9; Platelets 367; Potassium 4.1; Sodium 139  personally reviewed   Lipid Panel     Component Value Date/Time   CHOL 88 08/29/2017 0413   CHOL 195 01/28/2013 0939   TRIG 118 08/29/2017 0413   TRIG 206 (H) 01/28/2013 0939   HDL 23 (L) 08/29/2017 0413   HDL  43 01/28/2013 0939   CHOLHDL 3.8 08/29/2017 0413   VLDL 24 08/29/2017 0413   LDLCALC 41 08/29/2017 0413   LDLCALC 111 (H) 01/28/2013 0939   personally reviewed   Wt Readings from Last 3 Encounters:  10/19/17 244 lb 9.6 oz (110.9 kg)  10/08/17 243 lb (110.2 kg)  09/30/17 246 lb (111.6 kg)      Other studies personally reviewed: Additional studies/ records that were reviewed today include: AF clinic  notes,   Review of the above records today demonstrates: 2D echo 08/21/17 reveals EF 55%, mild LVH, LA 40 mm, RA 51.9 mm.   ASSESSMENT AND PLAN:  1.  Persistent atrial fibrillation/ typical atrial flutter The patient has symptomatic, recurrent persistent atrial fibrillation and atrial flutter. he has failed medical therapy with tikosyn and amiodarone. Chads2vasc score is 3.  he is anticoagulated with xarelto . Therapeutic strategies for afib including medicine and ablation were discussed in detail with the patient today. Risk, benefits, and alternatives to EP study and radiofrequency ablation for afib were also discussed in detail today. These risks include but are not limited to stroke, bleeding, vascular damage, tamponade, perforation, damage to the esophagus, lungs, and other structures, pulmonary vein stenosis, worsening renal function, and death. The patient understands these risk and wishes to proceed.  We will therefore proceed with catheter ablation at the next available time.  Carto, ICE, anesthesia are requested for the procedure.  Will also obtain cardiac CT prior to the procedure to exclude LAA thrombus and further evaluate atrial anatomy.  2. OSA Compliance with CPAP encouraged  3. CAD s/p CABG Stable No change required today I have encouraged cardiac rehab  4. HTN Stable No change required today   Current medicines are reviewed at length with the patient today.   The patient does not have concerns regarding his medicines.  The following changes were made today:  none      Signed, Thompson Grayer, MD  10/19/2017 10:51 AM     [facility] [address] [place] [address] [phone] (office) [phone] (fax)

## 2017-10-19 NOTE — Patient Instructions (Addendum)
Medication Instructions:  Your physician has recommended you make the following change in your medication:   1.  Reduce your amiodarone 200 mg-  Take one tablet by mouth daily   Labwork: You will get lab work within 30 days of your procedure:  BMP and CBC.  Please schedule lab work within 30 days of November 19, 2017  Testing/Procedures:  Your physician has requested that you have cardiac CT. Cardiac computed tomography (CT) is a painless test that uses an x-ray machine to take clear, detailed pictures of your heart. For further information please visit [email or web address]. Please follow instruction sheet as given.  You will get a call from our office to schedule the date for this test.  Your physician has recommended that you have an ablation. Catheter ablation is a medical procedure used to treat some cardiac arrhythmias (irregular heartbeats). During catheter ablation, a long, thin, flexible tube is put into a blood vessel in your groin (upper thigh), or neck. This tube is called an ablation catheter. It is then guided to your heart through the blood vessel. Radio frequency waves destroy small areas of heart tissue where abnormal heartbeats may cause an arrhythmia to start. Please see the instruction sheet given to you today.  Follow-Up: You will follow up with Paul Palau, NP with the Atrial fibrillation (Afib) clinic 4 weeks after your ablation.  You will follow up with Paul Todd 3 months after your procedure.  CARDIAC CT INSTRUCTIONS:  Please arrive at the [facility] main entrance of [facility] at xx:xx AM (30-45 minutes prior to test start time)  [facility] [place], [place] [address] [phone]  Proceed to the [facility] Radiology Department (First Floor).  Please follow these instructions carefully (unless otherwise directed):  On the Night Before the Test: . Be sure to Drink plenty of water. . Do not consume any  caffeinated/decaffeinated beverages or chocolate 12 hours prior to your test. . Do not take any antihistamines 12 hours prior to your test. . If you take Metformin do not take 24 hours prior to test.  On the Day of the Test: . Drink plenty of water. Do not drink any water within one hour of the test. . Do not eat any food 4 hours prior to the test. . You may take your regular medications prior to the test.  . Take metoprolol (Lopressor) two hours prior to test.                               -IF HR is greater than 55 BPM and patient is less than or equal to 49 yrs old Lopressor 100mg  x1.                      After the Test: . Drink plenty of water. . After receiving IV contrast, you may experience a mild flushed feeling. This is normal. . On occasion, you may experience a mild rash up to 24 hours after the test. This is not dangerous. If this occurs, you can take Benadryl 25 mg and increase your fluid intake. . If you experience trouble breathing, this can be serious. If it is severe call 911 IMMEDIATELY. If it is mild, please call our office.   ABLATION INSTRUCTIONS:  Please arrive at the [facility] main entrance of [place] hospital at: 8:30 am on November 19, 2017 Do not eat or drink  after midnight prior to procedure On the morning of your procedure do not take any medications. Plan for one night stay.   You will need someone to drive you home at discharge.    Cardiac Ablation Cardiac ablation is a procedure to disable (ablate) a small amount of heart tissue in very specific places. The heart has many electrical connections. Sometimes these connections are abnormal and can cause the heart to beat very fast or irregularly. Ablating some of the problem areas can improve the heart rhythm or return it to normal. Ablation may be done for people who:  Have Wolff-Parkinson-White syndrome.  Have fast heart rhythms (tachycardia).  Have taken medicines for an abnormal heart rhythm  (arrhythmia) that were not effective or caused side effects.  Have a high-risk heartbeat that may be life-threatening.  During the procedure, a small incision is made in the neck or the groin, and a long, thin, flexible tube (catheter) is inserted into the incision and moved to the heart. Small devices (electrodes) on the tip of the catheter will send out electrical currents. A type of X-ray (fluoroscopy) will be used to help guide the catheter and to provide images of the heart. Tell a health care provider about:  Any allergies you have.  All medicines you are taking, including vitamins, herbs, eye drops, creams, and over-the-counter medicines.  Any problems you or family members have had with anesthetic medicines.  Any blood disorders you have.  Any surgeries you have had.  Any medical conditions you have, such as kidney failure.  Whether you are pregnant or may be pregnant. What are the risks? Generally, this is a safe procedure. However, problems may occur, including:  Infection.  Bruising and bleeding at the catheter insertion site.  Bleeding into the chest, especially into the sac that surrounds the heart. This is a serious complication.  Stroke or blood clots.  Damage to other structures or organs.  Allergic reaction to medicines or dyes.  Need for a permanent pacemaker if the normal electrical system is damaged. A pacemaker is a small computer that sends electrical signals to the heart and helps your heart beat normally.  The procedure not being fully effective. This may not be recognized until months later. Repeat ablation procedures are sometimes required.  What happens before the procedure?  Follow instructions from your health care provider about eating or drinking restrictions.  Ask your health care provider about: ? Changing or stopping your regular medicines. This is especially important if you are taking diabetes medicines or blood thinners. ? Taking  medicines such as aspirin and ibuprofen. These medicines can thin your blood. Do not take these medicines before your procedure if your health care provider instructs you not to.  Plan to have someone take you home from the hospital or clinic.  If you will be going home right after the procedure, plan to have someone with you for 24 hours. What happens during the procedure?  To lower your risk of infection: ? Your health care team will wash or sanitize their hands. ? Your skin will be washed with soap. ? Hair may be removed from the incision area.  An IV tube will be inserted into one of your veins.  You will be given a medicine to help you relax (sedative).  The skin on your neck or groin will be numbed.  An incision will be made in your neck or your groin.  A needle will be inserted through the incision and into a  large vein in your neck or groin.  A catheter will be inserted into the needle and moved to your heart.  Dye may be injected through the catheter to help your surgeon see the area of the heart that needs treatment.  Electrical currents will be sent from the catheter to ablate heart tissue in desired areas. There are three types of energy that may be used to ablate heart tissue: ? Heat (radiofrequency energy). ? Laser energy. ? Extreme cold (cryoablation).  When the necessary tissue has been ablated, the catheter will be removed.  Pressure will be held on the catheter insertion area to prevent excessive bleeding.  A bandage (dressing) will be placed over the catheter insertion area. The procedure may vary among health care providers and hospitals. What happens after the procedure?  Your blood pressure, heart rate, breathing rate, and blood oxygen level will be monitored until the medicines you were given have worn off.  Your catheter insertion area will be monitored for bleeding. You will need to lie still for a few hours to ensure that you do not bleed from the  catheter insertion area.  Do not drive for 24 hours or as long as directed by your health care provider. Summary  Cardiac ablation is a procedure to disable (ablate) a small amount of heart tissue in very specific places. Ablating some of the problem areas can improve the heart rhythm or return it to normal.  During the procedure, electrical currents will be sent from the catheter to ablate heart tissue in desired areas. This information is not intended to replace advice given to you by your health care provider. Make sure you discuss any questions you have with your health care provider. Document Released: 05/11/2008 Document Revised: 11/12/2015 Document Reviewed: 11/12/2015 Elsevier Interactive Patient Education  [employer].

## 2017-10-20 ENCOUNTER — Other Ambulatory Visit: Payer: Self-pay | Admitting: Internal Medicine

## 2017-10-20 NOTE — Telephone Encounter (Signed)
Rx has been sent to the pharmacy electronically. ° °

## 2017-10-22 ENCOUNTER — Other Ambulatory Visit: Payer: Self-pay | Admitting: Cardiothoracic Surgery

## 2017-11-02 ENCOUNTER — Ambulatory Visit (INDEPENDENT_AMBULATORY_CARE_PROVIDER_SITE_OTHER): Payer: Self-pay | Admitting: Physician Assistant

## 2017-11-02 ENCOUNTER — Other Ambulatory Visit: Payer: Self-pay

## 2017-11-02 VITALS — BP 101/64 | HR 64 | Resp 16 | Ht 67.0 in | Wt 244.0 lb

## 2017-11-02 DIAGNOSIS — M792 Neuralgia and neuritis, unspecified: Secondary | ICD-10-CM

## 2017-11-02 DIAGNOSIS — Z951 Presence of aortocoronary bypass graft: Secondary | ICD-10-CM

## 2017-11-02 DIAGNOSIS — I2510 Atherosclerotic heart disease of native coronary artery without angina pectoris: Secondary | ICD-10-CM

## 2017-11-02 NOTE — Progress Notes (Signed)
HPI:  Patient returns for 1 month follow S/P Emergent CABG.  He was last seen by Dr. Prescott Gum at which time the patient was doing well.  He was experiencing some expected post operative incisional hypersensitivity and neuralgia.  He was given Neurontin for this.  He states he took this medication for 2 weeks, but overall he discontinued to this due to making him light headed.  He states overall the symptoms across his chest have improved since surgery.  He states he has gotten the most relief with a heating pad.     Current Outpatient Medications  Medication Sig Dispense Refill  . ACCU-CHEK AVIVA PLUS test strip USE UP TO 4 TIMES DAILY AS DIRECTED 120 each 0  . ACCU-CHEK SOFTCLIX LANCETS lancets USE UP TO 4 TIMES DAILY AS DIRECTED 100 each 0  . acetaminophen (TYLENOL) 650 MG CR tablet Take 1,300 mg by mouth 2 (two) times daily.     Marland Kitchen albuterol (PROVENTIL HFA;VENTOLIN HFA) 108 (90 Base) MCG/ACT inhaler Inhale 2 puffs into the lungs every 4 (four) hours as needed for wheezing or shortness of breath.     Marland Kitchen amiodarone (PACERONE) 200 MG tablet Take 1 tablet (200 mg total) by mouth daily. 90 tablet 3  . aspirin EC 81 MG tablet Take 81 mg by mouth daily.    Marland Kitchen atorvastatin (LIPITOR) 80 MG tablet Take 80 mg by mouth daily.    . blood glucose meter kit and supplies KIT Dispense based on patient and insurance preference. Use up to four times daily as directed. (FOR ICD-9 250.00, 250.01). 1 each 0  . diltiazem (CARDIZEM SR) 120 MG 12 hr capsule Take 1 capsule (120 mg total) by mouth every 12 (twelve) hours. 60 capsule 3  . docusate sodium (COLACE) 100 MG capsule Take 100 mg by mouth daily.   5  . DULoxetine (CYMBALTA) 60 MG capsule Take 60 mg by mouth every evening.    Marland Kitchen JARDIANCE 10 MG TABS tablet Take 10 mg by mouth daily.  3  . magnesium oxide (MAG-OX) 400 (241.3 MG) MG tablet Take 1 tablet (400 mg total) by mouth daily. (Patient taking differently: Take 250 mg by mouth daily. ) 30 tablet 11  .  Metoprolol Tartrate 37.5 MG TABS Take 37.5 mg by mouth 2 (two) times daily.    . Multiple Vitamin (MULTIVITAMIN WITH MINERALS) TABS tablet Take 1 tablet by mouth daily.     . nitroGLYCERIN (NITROSTAT) 0.4 MG SL tablet PLACE 1 TABLET UNDER TONGUE EVERY 5 MINUTES AS NEEDED FOR CHEST PAIN MAX 3 TABLETS (Patient taking differently: Place 0.4 mg under the tongue every 5 (five) minutes as needed for chest pain. ) 25 tablet 3  . NON FORMULARY at bedtime. CPAP    . pantoprazole (PROTONIX) 40 MG tablet Take 1 tablet (40 mg total) by mouth daily. 90 tablet 1  . Potassium 99 MG TABS Take 1 tablet by mouth daily.     . rivaroxaban (XARELTO) 20 MG TABS tablet Take 1 tablet (20 mg total) daily with supper by mouth. 90 tablet 3   No current facility-administered medications for this visit.     Physical Exam:  BP 101/64 (BP Location: Right Arm, Patient Position: Sitting, Cuff Size: Large)   Pulse 64 Comment: IRREG...ABLATION HAS BEEN SCHEDULED  Resp 16   Ht '5\' 7"'$  (1.702 m)   Wt 244 lb (110.7 kg)   SpO2 97% Comment: RA  BMI 38.22 kg/m   Gen: no apparent distress Heart: RRR Lungs:  CTA  Incisions: well healed  A/P:  1. Expected post operative sensitivity and neuralgia across chest- stopped Neurontin has not helping and made patient lightheaded.  This has improved since surgery and should resolve completely over next several monnths 2. A. Flutter- per [place] 3. Dispo- patient stable, continue to monitor neuralgia which should continue to improve, RTC in 3 months  [name], PA-C [facility] [phone]

## 2017-11-04 ENCOUNTER — Ambulatory Visit: Payer: Self-pay | Admitting: Cardiothoracic Surgery

## 2017-11-06 ENCOUNTER — Other Ambulatory Visit: Payer: BLUE CROSS/BLUE SHIELD

## 2017-11-06 DIAGNOSIS — I4819 Other persistent atrial fibrillation: Secondary | ICD-10-CM | POA: Diagnosis not present

## 2017-11-06 LAB — CBC WITH DIFFERENTIAL/PLATELET
BASOS ABS: 0 10*3/uL (ref 0.0–0.2)
Basos: 1 %
EOS (ABSOLUTE): 0.3 10*3/uL (ref 0.0–0.4)
Eos: 4 %
Hematocrit: 37.4 % — ABNORMAL LOW (ref 37.5–51.0)
Hemoglobin: 11.3 g/dL — ABNORMAL LOW (ref 13.0–17.7)
IMMATURE GRANS (ABS): 0 10*3/uL (ref 0.0–0.1)
Immature Granulocytes: 0 %
Lymphocytes Absolute: 2 10*3/uL (ref 0.7–3.1)
Lymphs: 25 %
MCH: 24.7 pg — AB (ref 26.6–33.0)
MCHC: 30.2 g/dL — ABNORMAL LOW (ref 31.5–35.7)
MCV: 82 fL (ref 79–97)
Monocytes Absolute: 0.7 10*3/uL (ref 0.1–0.9)
Monocytes: 9 %
NEUTROS ABS: 4.9 10*3/uL (ref 1.4–7.0)
Neutrophils: 61 %
Platelets: 328 10*3/uL (ref 150–450)
RBC: 4.58 x10E6/uL (ref 4.14–5.80)
RDW: 14.9 % (ref 12.3–15.4)
WBC: 7.9 10*3/uL (ref 3.4–10.8)

## 2017-11-06 LAB — BASIC METABOLIC PANEL
BUN/Creatinine Ratio: 16 (ref 9–20)
BUN: 15 mg/dL (ref 6–24)
CO2: 19 mmol/L — AB (ref 20–29)
CREATININE: 0.94 mg/dL (ref 0.76–1.27)
Calcium: 9.1 mg/dL (ref 8.7–10.2)
Chloride: 102 mmol/L (ref 96–106)
GFR calc Af Amer: 103 mL/min/{1.73_m2} (ref >59)
GFR calc non Af Amer: 89 mL/min/{1.73_m2} (ref >59)
GLUCOSE: 92 mg/dL (ref 65–99)
Potassium: 4.6 mmol/L (ref 3.5–5.2)
Sodium: 138 mmol/L (ref 134–144)

## 2017-11-09 ENCOUNTER — Telehealth (HOSPITAL_COMMUNITY): Payer: Self-pay

## 2017-11-09 NOTE — Telephone Encounter (Signed)
Pt was unavailable. Pt's wife states he will bring updated med list to cardiac rehab appt.  Thank you, Juanell Fairly, PharmD PGY1 Pharmacy Resident Phone [phone] 11/09/2017 4:45 PM

## 2017-11-09 NOTE — Progress Notes (Signed)
Paul Todd 57 y.o. male DOB 11/22/1960 MRN [id number]       Nutrition  No diagnosis found. Past Medical History:  Diagnosis Date  . Arthritis       . Atrial flutter ([place])   . CAD (coronary artery disease)    multivessel per cath 02/12/13, s/p stenting to RCA  . Chronic lower back pain   . Diabetes mellitus without complication ([place])   . Dyslipidemia   . History of kidney stones   . Hx of Bell's palsy   . Hypertension   . Myocardial infarction ([place]) 02/12/2013   . Nephrolithiasis   . Neuromuscular disorder ([facility])    Bell's Palsy  . OSA on CPAP   . PE (pulmonary thromboembolism) ([place]) 02/2014  . Persistent atrial fibrillation        Meds reviewed.    Current Outpatient Medications (Endocrine & Metabolic):  Marland Kitchen  JARDIANCE 10 MG TABS tablet, Take 10 mg by mouth daily.  Current Outpatient Medications (Cardiovascular):  .  amiodarone (PACERONE) 200 MG tablet, Take 1 tablet (200 mg total) by mouth daily. Marland Kitchen  atorvastatin (LIPITOR) 80 MG tablet, Take 80 mg by mouth daily. Marland Kitchen  diltiazem (CARDIZEM SR) 120 MG 12 hr capsule, Take 1 capsule (120 mg total) by mouth every 12 (twelve) hours. .  Metoprolol Tartrate 37.5 MG TABS, Take 37.5 mg by mouth 2 (two) times daily. .  nitroGLYCERIN (NITROSTAT) 0.4 MG SL tablet, PLACE 1 TABLET UNDER TONGUE EVERY 5 MINUTES AS NEEDED FOR CHEST PAIN MAX 3 TABLETS (Patient taking differently: Place 0.4 mg under the tongue every 5 (five) minutes as needed for chest pain. )  Current Outpatient Medications (Respiratory):  .  albuterol (PROVENTIL HFA;VENTOLIN HFA) 108 (90 Base) MCG/ACT inhaler, Inhale 2 puffs into the lungs every 4 (four) hours as needed for wheezing or shortness of breath.   Current Outpatient Medications (Analgesics):  .  acetaminophen (TYLENOL) 650 MG CR tablet, Take 1,300 mg by mouth 2 (two) times daily.  Marland Kitchen  aspirin EC 81 MG tablet, Take 81 mg by mouth daily.  Current Outpatient Medications (Hematological):  .  rivaroxaban (XARELTO) 20 MG  TABS tablet, Take 1 tablet (20 mg total) daily with supper by mouth.  Current Outpatient Medications (Other):  Marland Kitchen  ACCU-CHEK AVIVA PLUS test strip, USE UP TO 4 TIMES DAILY AS DIRECTED .  ACCU-CHEK SOFTCLIX LANCETS lancets, USE UP TO 4 TIMES DAILY AS DIRECTED .  blood glucose meter kit and supplies KIT, Dispense based on patient and insurance preference. Use up to four times daily as directed. (FOR ICD-9 250.00, 250.01). Marland Kitchen  docusate sodium (COLACE) 100 MG capsule, Take 100 mg by mouth daily.  .  DULoxetine (CYMBALTA) 60 MG capsule, Take 60 mg by mouth every evening. .  magnesium oxide (MAG-OX) 400 (241.3 MG) MG tablet, Take 1 tablet (400 mg total) by mouth daily. (Patient taking differently: Take 250 mg by mouth daily. ) .  Multiple Vitamin (MULTIVITAMIN WITH MINERALS) TABS tablet, Take 1 tablet by mouth daily.  .  NON FORMULARY, at bedtime. CPAP .  pantoprazole (PROTONIX) 40 MG tablet, Take 1 tablet (40 mg total) by mouth daily. .  Potassium 99 MG TABS, Take 1 tablet by mouth daily.    HT: Ht Readings from Last 1 Encounters:  11/02/17 '5\' 7"'$  (1.702 m)    WT: Wt Readings from Last 5 Encounters:  11/02/17 244 lb (110.7 kg)  10/19/17 244 lb 9.6 oz (110.9 kg)  10/08/17 243 lb (110.2 kg)  09/30/17 246 lb (111.6 kg)  09/25/17 248 lb 6.4 oz (112.7 kg)     BMI 38.21 (11/02/17)  Current tobacco use? No       Labs:  Lipid Panel     Component Value Date/Time   CHOL 88 08/29/2017 0413   CHOL 195 01/28/2013 0939   TRIG 118 08/29/2017 0413   TRIG 206 (H) 01/28/2013 0939   HDL 23 (L) 08/29/2017 0413   HDL 43 01/28/2013 0939   CHOLHDL 3.8 08/29/2017 0413   VLDL 24 08/29/2017 0413   LDLCALC 41 08/29/2017 0413   LDLCALC 111 (H) 01/28/2013 0939    Lab Results  Component Value Date   HGBA1C 7.4 (H) 08/22/2017   CBG (last 3)  No results for input(s): GLUCAP in the last 72 hours.  Nutrition Diagnosis ? Food-and nutrition-related knowledge deficit related to lack of exposure to  information as related to diagnosis of: ? CVD ? Type 2 Diabetes ? Obese  II = 35-39.9 related to excessive energy intake as evidenced by a BMI 38.21 (11/02/17)  Nutrition Goal(s):  ? To be determined  Plan:  Pt to attend nutrition classes ? Nutrition I ? Nutrition II ? Portion Distortion  ? Diabetes Blitz ? Diabetes Q & A Will provide client-centered nutrition education as part of interdisciplinary care.   Monitor and evaluate progress toward nutrition goal with team.  Laurina Bustle, MS, RD, LDN 11/09/2017 10:32 AM

## 2017-11-10 ENCOUNTER — Encounter (HOSPITAL_COMMUNITY)
Admission: RE | Admit: 2017-11-10 | Discharge: 2017-11-10 | Disposition: A | Discharge status: Home / Self Care | Payer: BLUE CROSS/BLUE SHIELD | Source: Ambulatory Visit | Attending: Internal Medicine | Admitting: Internal Medicine

## 2017-11-10 ENCOUNTER — Encounter (HOSPITAL_COMMUNITY): Payer: Self-pay

## 2017-11-10 VITALS — Ht 66.25 in | Wt 246.9 lb

## 2017-11-10 DIAGNOSIS — Z951 Presence of aortocoronary bypass graft: Secondary | ICD-10-CM

## 2017-11-10 NOTE — Progress Notes (Signed)
Cardiac Individual Treatment Plan  Patient Details  Name: Paul Todd MRN: [id number] Date of Birth: 1960/05/04 Referring Provider:   Flowsheet Row CARDIAC REHAB PHASE II ORIENTATION from 11/10/2017 in [facility]  Referring Provider  Pixie Casino MD       Initial Encounter Date:  [facility] PHASE II ORIENTATION from 11/10/2017 in [facility]  Date  11/10/17      Visit Diagnosis: 08/24/2017 S/P CABG (coronary artery bypass graft)  Patient's Home Medications on Admission:  Current Outpatient Medications:  .  ACCU-CHEK AVIVA PLUS test strip, USE UP TO 4 TIMES DAILY AS DIRECTED, Disp: 120 each, Rfl: 0 .  ACCU-CHEK SOFTCLIX LANCETS lancets, USE UP TO 4 TIMES DAILY AS DIRECTED, Disp: 100 each, Rfl: 0 .  acetaminophen (TYLENOL) 650 MG CR tablet, Take 1,300 mg by mouth 2 (two) times daily. , Disp: , Rfl:  .  albuterol (PROVENTIL HFA;VENTOLIN HFA) 108 (90 Base) MCG/ACT inhaler, Inhale 2 puffs into the lungs every 4 (four) hours as needed for wheezing or shortness of breath. , Disp: , Rfl:  .  amiodarone (PACERONE) 200 MG tablet, Take 1 tablet (200 mg total) by mouth daily., Disp: 90 tablet, Rfl: 3 .  aspirin EC 81 MG tablet, Take 81 mg by mouth daily., Disp: , Rfl:  .  atorvastatin (LIPITOR) 80 MG tablet, Take 80 mg by mouth daily., Disp: , Rfl:  .  blood glucose meter kit and supplies KIT, Dispense based on patient and insurance preference. Use up to four times daily as directed. (FOR ICD-9 250.00, 250.01)., Disp: 1 each, Rfl: 0 .  diltiazem (CARDIZEM SR) 120 MG 12 hr capsule, Take 1 capsule (120 mg total) by mouth every 12 (twelve) hours., Disp: 60 capsule, Rfl: 3 .  docusate sodium (COLACE) 100 MG capsule, Take 100 mg by mouth daily. , Disp: , Rfl: 5 .  DULoxetine (CYMBALTA) 60 MG capsule, Take 60 mg by mouth every evening., Disp: , Rfl:  .  JARDIANCE 10 MG TABS tablet, Take 10 mg by mouth daily., Disp: ,  Rfl: 3 .  magnesium oxide (MAG-OX) 400 (241.3 MG) MG tablet, Take 1 tablet (400 mg total) by mouth daily. (Patient taking differently: Take 250 mg by mouth daily. ), Disp: 30 tablet, Rfl: 11 .  Metoprolol Tartrate 37.5 MG TABS, Take 37.5 mg by mouth 2 (two) times daily., Disp: , Rfl:  .  Multiple Vitamin (MULTIVITAMIN WITH MINERALS) TABS tablet, Take 1 tablet by mouth daily. , Disp: , Rfl:  .  nitroGLYCERIN (NITROSTAT) 0.4 MG SL tablet, PLACE 1 TABLET UNDER TONGUE EVERY 5 MINUTES AS NEEDED FOR CHEST PAIN MAX 3 TABLETS (Patient taking differently: Place 0.4 mg under the tongue every 5 (five) minutes as needed for chest pain. ), Disp: 25 tablet, Rfl: 3 .  NON FORMULARY, at bedtime. CPAP, Disp: , Rfl:  .  pantoprazole (PROTONIX) 40 MG tablet, Take 1 tablet (40 mg total) by mouth daily., Disp: 90 tablet, Rfl: 1 .  Potassium 99 MG TABS, Take 1 tablet by mouth daily. , Disp: , Rfl:  .  rivaroxaban (XARELTO) 20 MG TABS tablet, Take 1 tablet (20 mg total) daily with supper by mouth., Disp: 90 tablet, Rfl: 3  Past Medical History: Past Medical History:  Diagnosis Date  . Arthritis       . Atrial flutter ([facility])   . CAD (coronary artery disease)    multivessel per cath 02/12/13, s/p stenting  to RCA  . Chronic lower back pain   . Diabetes mellitus without complication ([place])   . Dyslipidemia   . History of kidney stones   . Hx of Bell's palsy   . Hypertension   . Myocardial infarction ([place] [facility]) 02/12/2013   . Nephrolithiasis   . Neuromuscular disorder ([facility])    Bell's Palsy  . OSA on CPAP   . PE (pulmonary thromboembolism) ([place] [facility]) 02/2014  . Persistent atrial fibrillation         Tobacco Use: Social History   Tobacco Use  Smoking Status Former Smoker  . Packs/day: 0.50  . Years: 35.00  . Pack years: 17.50  . Types: Cigarettes  . Last attempt to quit: 02/12/2013  . Years since quitting: 4.7  Smokeless Tobacco Never Used    Labs: Recent Review Flowsheet Data    Labs for ITP Cardiac and Pulmonary  Rehab Latest Ref Rng & Units 08/25/2017 08/25/2017 08/26/2017 08/26/2017 08/29/2017   Cholestrol 0 - 200 mg/dL - - - - 88   LDLCALC 0 - 99 mg/dL - - - - 41   HDL >40 mg/dL - - - - 23(L)   Trlycerides <150 mg/dL - - - - 118   Hemoglobin A1c 4.8 - 5.6 % - - - - -   PHART 7.350 - 7.450 7.387 - - 7.421 -   PCO2ART 32.0 - 48.0 mmHg 37.2 - - 39.7 -   HCO3 20.0 - 28.0 mmol/L 22.4 - - 25.8 -   TCO2 22 - 32 mmol/L 24 24 - 27 -   ACIDBASEDEF 0.0 - 2.0 mmol/L 2.0 - - - -   O2SAT % 90.0 - 69.2 99.0 -      Capillary Blood Glucose: Lab Results  Component Value Date   GLUCAP 98 09/29/2017   GLUCAP 124 (H) 09/02/2017   GLUCAP 97 09/02/2017   GLUCAP 107 (H) 09/02/2017   GLUCAP 113 (H) 09/01/2017     Exercise Target Goals: Exercise Program Goal: Individual exercise prescription set using results from initial 6 min walk test and THRR while considering  patient's activity barriers and safety.   Exercise Prescription Goal: Initial exercise prescription builds to 30-45 minutes a day of aerobic activity, 2-3 days per week.  Home exercise guidelines will be given to patient during program as part of exercise prescription that the participant will acknowledge.  Activity Barriers & Risk Stratification: Activity Barriers & Cardiac Risk Stratification - 11/10/17 1032    Activity Barriers & Cardiac Risk Stratification          Activity Barriers  Incisional Pain;Back Problems;Other (comment)    Comments  PAD: Claudication Pain     Cardiac Risk Stratification  High           6 Minute Walk: 6 Minute Walk    6 Minute Walk    Row Name 11/10/17 1028   Distance  1200 feet   Walk Time  5.25 minutes   # of Rest Breaks  1   MPH  2.27   METS  2.88   RPE  13   Perceived Dyspnea   0   VO2 Peak  10.1   Symptoms  Yes (comment)   Comments  9/10 Claudication Pain (right leg). Stopped test at 5:25   Resting HR  75 bpm   Resting BP  114/70   Resting Oxygen Saturation   97 %   Exercise Oxygen Saturation   during 6 min walk  98 %   Max Ex. HR  114 bpm   Max Ex. BP  138/80   2 Minute Post BP  112/70          Oxygen Initial Assessment:   Oxygen Re-Evaluation:   Oxygen Discharge (Final Oxygen Re-Evaluation):   Initial Exercise Prescription: Initial Exercise Prescription - 11/10/17 1000    Date of Initial Exercise RX and Referring Provider          Date  11/10/17    Referring Provider  Pixie Casino MD     Expected Discharge Date  02/15/18        Recumbant Bike          Level  1.5    Watts  30    Minutes  10    METs  2.86        NuStep          Level  2    SPM  75    Minutes  10    METs  2.5        Track          Laps  8    Minutes  10    METs  2.38        Prescription Details          Frequency (times per week)  3x    Duration  Progress to 30 minutes of continuous aerobic without signs/symptoms of physical distress        Intensity          THRR 40-80% of Max Heartrate  65-130    Ratings of Perceived Exertion  11-13    Perceived Dyspnea  0-4        Progression          Progression  Continue progressive overload as per policy without signs/symptoms or physical distress.        Resistance Training          Training Prescription  Yes    Weight  3lbs    Reps  10-15           Perform Capillary Blood Glucose checks as needed.  Exercise Prescription Changes:   Exercise Comments:   Exercise Goals and Review: Exercise Goals    Exercise Goals    Row Name 11/10/17 1032   Increase Physical Activity  Yes   Intervention  Provide advice, education, support and counseling about physical activity/exercise needs.;Develop an individualized exercise prescription for aerobic and resistive training based on initial evaluation findings, risk stratification, comorbidities and participant's personal goals.   Expected Outcomes  Short Term: Attend rehab on a regular basis to increase amount of physical activity.;Long Term: Add in home exercise to make  exercise part of routine and to increase amount of physical activity.;Long Term: Exercising regularly at least 3-5 days a week.   Increase Strength and Stamina  Yes   Intervention  Develop an individualized exercise prescription for aerobic and resistive training based on initial evaluation findings, risk stratification, comorbidities and participant's personal goals.;Provide advice, education, support and counseling about physical activity/exercise needs.   Expected Outcomes  Short Term: Increase workloads from initial exercise prescription for resistance, speed, and METs.;Short Term: Perform resistance training exercises routinely during rehab and add in resistance training at home;Long Term: Improve cardiorespiratory fitness, muscular endurance and strength as measured by increased METs and functional capacity (6MWT)   Able to understand and use rate of perceived exertion (RPE) scale  Yes   Intervention  Provide education and explanation on how to  use RPE scale   Expected Outcomes  Short Term: Able to use RPE daily in rehab to express subjective intensity level;Long Term:  Able to use RPE to guide intensity level when exercising independently   Knowledge and understanding of Target Heart Rate Range (THRR)  Yes   Intervention  Provide education and explanation of THRR including how the numbers were predicted and where they are located for reference   Expected Outcomes  Short Term: Able to state/look up THRR;Long Term: Able to use THRR to govern intensity when exercising independently;Short Term: Able to use daily as guideline for intensity in rehab   Able to check pulse independently  Yes   Intervention  Provide education and demonstration on how to check pulse in carotid and radial arteries.;Review the importance of being able to check your own pulse for safety during independent exercise   Expected Outcomes  Short Term: Able to explain why pulse checking is important during independent exercise;Long  Term: Able to check pulse independently and accurately   Understanding of Exercise Prescription  Yes   Intervention  Provide education, explanation, and written materials on patient's individual exercise prescription   Expected Outcomes  Short Term: Able to explain program exercise prescription;Long Term: Able to explain home exercise prescription to exercise independently          Exercise Goals Re-Evaluation :   Discharge Exercise Prescription (Final Exercise Prescription Changes):   Nutrition:  Target Goals: Understanding of nutrition guidelines, daily intake of sodium '1500mg'$ , cholesterol '200mg'$ , calories 30% from fat and 7% or less from saturated fats, daily to have 5 or more servings of fruits and vegetables.  Biometrics: Pre Biometrics - 11/10/17 1031    Pre Biometrics          Height  5' 6.25" (1.683 m)    Weight  112 kg    Waist Circumference  46 inches    Hip Circumference  49 inches    Waist to Hip Ratio  0.94 %    BMI (Calculated)  39.54    Triceps Skinfold  32 mm    % Body Fat  38 %    Grip Strength  38 kg    Flexibility  0 in    Single Leg Stand  18.31 seconds            Nutrition Therapy Plan and Nutrition Goals: Nutrition Therapy & Goals - 11/10/17 0858    Nutrition Therapy          Diet  heart healthy, carb modified        Personal Nutrition Goals          Nutrition Goal  Pt to identify and limit food sources of saturated fat, trans fat, refined carbohydrates and sodium    Personal Goal #2  Pt to identify food quantities necessary to achieve weight loss of 6-24 lbs. at graduation from cardiac rehab    Personal Goal #3  Improved blood glucose control as evidenced by pt's A1c trending from 7.4 toward less than 7.0.    Personal Goal #4  Pt able to name foods that affect blood glucose.        Intervention Plan          Intervention  Prescribe, educate and counsel regarding individualized specific dietary modifications aiming towards targeted core  components such as weight, hypertension, lipid management, diabetes, heart failure and other comorbidities.    Expected Outcomes  Short Term Goal: Understand basic principles of dietary content, such as calories,  fat, sodium, cholesterol and nutrients.;Long Term Goal: Adherence to prescribed nutrition plan.           Nutrition Assessments: Nutrition Assessments - 11/10/17 0859    MEDFICTS Scores          Pre Score  46           Nutrition Goals Re-Evaluation:   Nutrition Goals Re-Evaluation:   Nutrition Goals Discharge (Final Nutrition Goals Re-Evaluation):   Psychosocial: Target Goals: Acknowledge presence or absence of significant depression and/or stress, maximize coping skills, provide positive support system. Participant is able to verbalize types and ability to use techniques and skills needed for reducing stress and depression.  Initial Review & Psychosocial Screening: Initial Psych Review & Screening - 11/10/17 0914    Initial Review          Current issues with  None Identified        Family Dynamics          Good Support System?  Yes   spouse, children           Quality of Life Scores: Quality of Life - 11/10/17 0915    Quality of Life          Select  Quality of Life        Quality of Life Scores          Health/Function Pre  17.6 %    Socioeconomic Pre  23.3 %    Psych/Spiritual Pre  22.1 %    Family Pre  24 %    GLOBAL Pre  20.7 %          Scores of 19 and below usually indicate a poorer quality of life in these areas.  A difference of  2-3 points is a clinically meaningful difference.  A difference of 2-3 points in the total score of the Quality of Life Index has been associated with significant improvement in overall quality of life, self-image, physical symptoms, and general health in studies assessing change in quality of life.  PHQ-9: Recent Review Flowsheet Data    Depression screen [facility] 2/9 10/08/2017   Decreased Interest 0   Down,  Depressed, Hopeless 0   PHQ - 2 Score 0     Interpretation of Total Score  Total Score Depression Severity:  1-4 = Minimal depression, 5-9 = Mild depression, 10-14 = Moderate depression, 15-19 = Moderately severe depression, 20-27 = Severe depression   Psychosocial Evaluation and Intervention:   Psychosocial Re-Evaluation:   Psychosocial Discharge (Final Psychosocial Re-Evaluation):   Vocational Rehabilitation: Provide vocational rehab assistance to qualifying candidates.   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 Participant will state understanding/return demonstration of topics presented.  Learning Barriers/Preferences: Learning Barriers/Preferences - 11/10/17 1045    Learning Barriers/Preferences          Learning Barriers  Sight    Learning Preferences  Verbal Instruction;Skilled Demonstration;Written Material           Education Topics: Count Your Pulse:  -Group instruction provided by verbal instruction, demonstration, patient participation and written materials to support subject.  Instructors address importance of being able to find your pulse and how  to count your pulse when at home without a heart monitor.  Patients get hands on experience counting their pulse with staff help and individually.   Heart Attack, Angina, and Risk Factor Modification:  -Group instruction provided by verbal instruction, video, and written materials to support subject.  Instructors address signs and symptoms of angina and heart attacks.    Also discuss risk factors for heart disease and how to make changes to improve heart health risk factors.   Functional Fitness:  -Group instruction  provided by verbal instruction, demonstration, patient participation, and written materials to support subject.  Instructors address safety measures for doing things around the house.  Discuss how to get up and down off the floor, how to pick things up properly, how to safely get out of a chair without assistance, and balance training.   Meditation and Mindfulness:  -Group instruction provided by verbal instruction, patient participation, and written materials to support subject.  Instructor addresses importance of mindfulness and meditation practice to help reduce stress and improve awareness.  Instructor also leads participants through a meditation exercise.    Stretching for Flexibility and Mobility:  -Group instruction provided by verbal instruction, patient participation, and written materials to support subject.  Instructors lead participants through series of stretches that are designed to increase flexibility thus improving mobility.  These stretches are additional exercise for major muscle groups that are typically performed during regular warm up and cool down.   Hands Only CPR:  -Group verbal, video, and participation provides a basic overview of AHA guidelines for community CPR. Role-play of emergencies allow participants the opportunity to practice calling for help and chest compression technique with discussion of AED use.   Hypertension: -Group verbal and written instruction that provides a basic overview of hypertension including the most recent diagnostic guidelines, risk factor reduction with self-care instructions and medication management.    Nutrition I class: Heart Healthy Eating:  -Group instruction provided by PowerPoint slides, verbal discussion, and written materials to support subject matter. The instructor gives an explanation and review of the Therapeutic Lifestyle Changes diet recommendations, which includes a discussion on lipid goals, dietary fat, sodium, fiber,  plant stanol/sterol esters, sugar, and the components of a well-balanced, healthy diet.   Nutrition II class: Lifestyle Skills:  -Group instruction provided by PowerPoint slides, verbal discussion, and written materials to support subject matter. The instructor gives an explanation and review of label reading, grocery shopping for heart health, heart healthy recipe modifications, and ways to make healthier choices when eating out.   Diabetes Question & Answer:  -Group instruction provided by PowerPoint slides, verbal discussion, and written materials to support subject matter. The instructor gives an explanation and review of diabetes co-morbidities, pre- and post-prandial blood glucose goals, pre-exercise blood glucose goals, signs, symptoms, and treatment of hypoglycemia and hyperglycemia, and foot care basics.   Diabetes Blitz:  -Group instruction provided by PowerPoint slides, verbal discussion, and written materials to support subject matter. The instructor gives an explanation and review of the physiology behind type 1 and type 2 diabetes, diabetes medications and rational behind using different medications, pre- and post-prandial blood glucose recommendations and Hemoglobin A1c goals, diabetes diet, and exercise including blood glucose guidelines for exercising safely.    Portion Distortion:  -Group instruction provided by PowerPoint slides, verbal discussion, written materials, and food models to support subject matter. The instructor gives an explanation of serving size versus portion size, changes in portions sizes over the last 20 years, and what consists  of a serving from each food group.   Stress Management:  -Group instruction provided by verbal instruction, video, and written materials to support subject matter.  Instructors review role of stress in heart disease and how to cope with stress positively.     Exercising on Your Own:  -Group instruction provided by verbal  instruction, power point, and written materials to support subject.  Instructors discuss benefits of exercise, components of exercise, frequency and intensity of exercise, and end points for exercise.  Also discuss use of nitroglycerin and activating EMS.  Review options of places to exercise outside of rehab.  Review guidelines for sex with heart disease.   Cardiac Drugs I:  -Group instruction provided by verbal instruction and written materials to support subject.  Instructor reviews cardiac drug classes: antiplatelets, anticoagulants, beta blockers, and statins.  Instructor discusses reasons, side effects, and lifestyle considerations for each drug class.   Cardiac Drugs II:  -Group instruction provided by verbal instruction and written materials to support subject.  Instructor reviews cardiac drug classes: angiotensin converting enzyme inhibitors (ACE-I), angiotensin II receptor blockers (ARBs), nitrates, and calcium channel blockers.  Instructor discusses reasons, side effects, and lifestyle considerations for each drug class.   Anatomy and Physiology of the Circulatory System:  Group verbal and written instruction and models provide basic cardiac anatomy and physiology, with the coronary electrical and arterial systems. Review of: AMI, Angina, Valve disease, Heart Failure, Peripheral Artery Disease, Cardiac Arrhythmia, Pacemakers, and the ICD.   Other Education:  -Group or individual verbal, written, or video instructions that support the educational goals of the cardiac rehab program.   Holiday Eating Survival Tips:  -Group instruction provided by PowerPoint slides, verbal discussion, and written materials to support subject matter. The instructor gives patients tips, tricks, and techniques to help them not only survive but enjoy the holidays despite the onslaught of food that accompanies the holidays.   Knowledge Questionnaire Score: Knowledge Questionnaire Score - 11/10/17 0914     Knowledge Questionnaire Score          Pre Score  23/24           Core Components/Risk Factors/Patient Goals at Admission: Personal Goals and Risk Factors at Admission - 11/10/17 1045    Core Components/Risk Factors/Patient Goals on Admission           Weight Management  Yes;Obesity;Weight Loss    Intervention  Weight Management: Develop a combined nutrition and exercise program designed to reach desired caloric intake, while maintaining appropriate intake of nutrient and fiber, sodium and fats, and appropriate energy expenditure required for the weight goal.;Weight Management: Provide education and appropriate resources to help participant work on and attain dietary goals.;Weight Management/Obesity: Establish reasonable short term and long term weight goals.;Obesity: Provide education and appropriate resources to help participant work on and attain dietary goals.    Admit Weight  246 lb 14.6 oz (112 kg)    Goal Weight: Long Term  236 lb (107 kg)    Expected Outcomes  Short Term: Continue to assess and modify interventions until short term weight is achieved;Long Term: Adherence to nutrition and physical activity/exercise program aimed toward attainment of established weight goal;Weight Loss: Understanding of general recommendations for a balanced deficit meal plan, which promotes 1-2 lb weight loss per week and includes a negative energy balance of [phone] kcal/d;Understanding recommendations for meals to include 15-35% energy as protein, 25-35% energy from fat, 35-60% energy from carbohydrates, less than 225m of dietary cholesterol, 20-35 gm of total  fiber daily;Understanding of distribution of calorie intake throughout the day with the consumption of 4-5 meals/snacks    Diabetes  Yes    Intervention  Provide education about signs/symptoms and action to take for hypo/hyperglycemia.;Provide education about proper nutrition, including hydration, and aerobic/resistive exercise prescription along  with prescribed medications to achieve blood glucose in normal ranges: Fasting glucose 65-99 mg/dL    Expected Outcomes  Short Term: Participant verbalizes understanding of the signs/symptoms and immediate care of hyper/hypoglycemia, proper foot care and importance of medication, aerobic/resistive exercise and nutrition plan for blood glucose control.;Long Term: Attainment of HbA1C < 7%.    Hypertension  Yes    Intervention  Provide education on lifestyle modifcations including regular physical activity/exercise, weight management, moderate sodium restriction and increased consumption of fresh fruit, vegetables, and low fat dairy, alcohol moderation, and smoking cessation.;Monitor prescription use compliance.    Expected Outcomes  Short Term: Continued assessment and intervention until BP is < 140/55m HG in hypertensive participants. < 130/872mHG in hypertensive participants with diabetes, heart failure or chronic kidney disease.;Long Term: Maintenance of blood pressure at goal levels.    Lipids  Yes    Intervention  Provide education and support for participant on nutrition & aerobic/resistive exercise along with prescribed medications to achieve LDL <7056mHDL >43m29m  Expected Outcomes  Short Term: Participant states understanding of desired cholesterol values and is compliant with medications prescribed. Participant is following exercise prescription and nutrition guidelines.;Long Term: Cholesterol controlled with medications as prescribed, with individualized exercise RX and with personalized nutrition plan. Value goals: LDL < 70mg71mL > 40 mg.           Core Components/Risk Factors/Patient Goals Review:    Core Components/Risk Factors/Patient Goals at Discharge (Final Review):    ITP Comments: ITP Comments    Row Name 11/10/17 0809   ITP Comments  Dr. TraciFransico Himical Director       Comments: Patient attended orientation from 0802 to 0907  to review rules and guidelines for  program. Completed 6 minute walk test, Intitial ITP, and exercise prescription.  VSS. Telemetry-atrial flutter.  Pt c/o right leg and back pain, chronic.  Leg pain worse with walking.  Walk test stopped at 5:25 due to pain.    Pt reports ablation scheduled next week.  Pt will begin group exercise after cleared by Dr. AllreRayann HemanannAndi Hence BSN Cardiac [facility] 11/10/17 11:13 AM

## 2017-11-10 NOTE — Progress Notes (Signed)
Paul Todd 57 y.o. male DOB: 28-Jul-1960 MRN: [id number]      Nutrition Note  1. 08/24/2017 S/P CABG (coronary artery bypass graft)    Past Medical History:  Diagnosis Date  . Arthritis       . Atrial flutter ([place])   . CAD (coronary artery disease)    multivessel per cath 02/12/13, s/p stenting to RCA  . Chronic lower back pain   . Diabetes mellitus without complication ([place])   . Dyslipidemia   . History of kidney stones   . Hx of Bell's palsy   . Hypertension   . Myocardial infarction ([place]) 02/12/2013   . Nephrolithiasis   . Neuromuscular disorder (HCC)    Bell's Palsy  . OSA on CPAP   . PE (pulmonary thromboembolism) ([place]) 02/2014  . Persistent atrial fibrillation        Meds reviewed.   Current Outpatient Medications (Endocrine & Metabolic):  Marland Kitchen  JARDIANCE 10 MG TABS tablet, Take 10 mg by mouth daily.  Current Outpatient Medications (Cardiovascular):  .  amiodarone (PACERONE) 200 MG tablet, Take 1 tablet (200 mg total) by mouth daily. Marland Kitchen  atorvastatin (LIPITOR) 80 MG tablet, Take 80 mg by mouth daily. Marland Kitchen  diltiazem (CARDIZEM SR) 120 MG 12 hr capsule, Take 1 capsule (120 mg total) by mouth every 12 (twelve) hours. .  Metoprolol Tartrate 37.5 MG TABS, Take 37.5 mg by mouth 2 (two) times daily. .  nitroGLYCERIN (NITROSTAT) 0.4 MG SL tablet, PLACE 1 TABLET UNDER TONGUE EVERY 5 MINUTES AS NEEDED FOR CHEST PAIN MAX 3 TABLETS (Patient taking differently: Place 0.4 mg under the tongue every 5 (five) minutes as needed for chest pain. )  Current Outpatient Medications (Respiratory):  .  albuterol (PROVENTIL HFA;VENTOLIN HFA) 108 (90 Base) MCG/ACT inhaler, Inhale 2 puffs into the lungs every 4 (four) hours as needed for wheezing or shortness of breath.   Current Outpatient Medications (Analgesics):  .  acetaminophen (TYLENOL) 650 MG CR tablet, Take 1,300 mg by mouth 2 (two) times daily.  Marland Kitchen  aspirin EC 81 MG tablet, Take 81 mg by mouth daily.  Current Outpatient Medications  (Hematological):  .  rivaroxaban (XARELTO) 20 MG TABS tablet, Take 1 tablet (20 mg total) daily with supper by mouth.  Current Outpatient Medications (Other):  Marland Kitchen  ACCU-CHEK AVIVA PLUS test strip, USE UP TO 4 TIMES DAILY AS DIRECTED .  ACCU-CHEK SOFTCLIX LANCETS lancets, USE UP TO 4 TIMES DAILY AS DIRECTED .  blood glucose meter kit and supplies KIT, Dispense based on patient and insurance preference. Use up to four times daily as directed. (FOR ICD-9 250.00, 250.01). Marland Kitchen  docusate sodium (COLACE) 100 MG capsule, Take 100 mg by mouth daily.  .  DULoxetine (CYMBALTA) 60 MG capsule, Take 60 mg by mouth every evening. .  magnesium oxide (MAG-OX) 400 (241.3 MG) MG tablet, Take 1 tablet (400 mg total) by mouth daily. (Patient taking differently: Take 250 mg by mouth daily. ) .  Multiple Vitamin (MULTIVITAMIN WITH MINERALS) TABS tablet, Take 1 tablet by mouth daily.  .  NON FORMULARY, at bedtime. CPAP .  pantoprazole (PROTONIX) 40 MG tablet, Take 1 tablet (40 mg total) by mouth daily. .  Potassium 99 MG TABS, Take 1 tablet by mouth daily.    HT: Ht Readings from Last 1 Encounters:  11/02/17 _0  (1.702 m)    WT: Wt Readings from Last 5 Encounters:  11/02/17 244 lb (110.7 kg)  10/19/17 244 lb 9.6 oz (110.9 kg)  10/08/17 243 lb (110.2 kg)  09/30/17 246 lb (111.6 kg)  09/25/17 248 lb 6.4 oz (112.7 kg)     BMI 38.21 (11/02/17)  Current tobacco use? No  Labs:  Lipid Panel     Component Value Date/Time   CHOL 88 08/29/2017 0413   CHOL 195 01/28/2013 0939   TRIG 118 08/29/2017 0413   TRIG 206 (H) 01/28/2013 0939   HDL 23 (L) 08/29/2017 0413   HDL 43 01/28/2013 0939   CHOLHDL 3.8 08/29/2017 0413   VLDL 24 08/29/2017 0413   LDLCALC 41 08/29/2017 0413   LDLCALC 111 (H) 01/28/2013 0939    Lab Results  Component Value Date   HGBA1C 7.4 (H) 08/22/2017   CBG (last 3)  No results for input(s): GLUCAP in the last 72 hours.  Nutrition Note Spoke with pt. Nutrition plan and goals  reviewed with pt. Pt is following Step 2 of the Therapeutic Lifestyle Changes diet. Pt wants to lose wt. Pt has been trying to lose wt by eating more fruit, cutting back on carbs, being more physically active. Pt would like to lose in total about 30 lbs, has a realistic expectation about timeframe of weight loss. Additional heart healthy and weight loss tips reviewed (label reading, how to build a healthy plate, portion sizes, eating frequently across the day, complex vs refined carbs). Pt has Type 2 Diabetes. Last A1c indicates blood glucose well-controlled. This Probation officer went over Diabetes Education test results. Pt checks CBG's 3 times a day. Fasting CBG's reportedly 130-150's mg/dL. Per discussion, pt does not use canned/convenience foods often. Pt does not add salt to food. Pt does not eat out frequently. Pt expressed understanding of the information reviewed. Pt aware of nutrition education classes offered and would like to attend nutrition classes.  Nutrition Diagnosis ? Food-and nutrition-related knowledge deficit related to lack of exposure to information as related to diagnosis of: ? CVD ? Type 2 Diabetes ? Obese  II = 35-39.9 related to excessive energy intake as evidenced by a BMI 38.21 (11/02/17)  Nutrition Intervention ? Pt's individual nutrition plan and goals reviewed with pt. ? Pt given handouts for: ? Nutrition I class ? Nutrition II class  ? Diabetes Blitz Class ? Diabetes Q & A class  ? Consistent vit K diet ? low sodium ? DM ? pre-diabetes  Nutrition Goal(s):   ? Pt to identify and limit food sources of saturated fat, trans fat, refined carbohydrates and sodium ? Pt to identify food quantities necessary to achieve weight loss of 6-24 lbs. at graduation from cardiac rehab. Goal wt loss of ~30 lb desired.  ? Improved blood glucose control as evidenced by pt's A1c trending from 7.4 toward less than 7.0. ? Pt able to name foods that affect blood glucose.   Plan:  ? Pt to attend  nutrition classes ? Nutrition I ? Nutrition II ? Portion Distortion  ? Diabetes Blitz ? Diabetes Q & Ae determined ? Will provide client-centered nutrition education as part of interdisciplinary care ? Monitor and evaluate progress toward nutrition goal with team.   Laurina Bustle, MS, RD, LDN 11/10/2017 8:54 AM

## 2017-11-13 ENCOUNTER — Ambulatory Visit (HOSPITAL_COMMUNITY)
Admission: RE | Admit: 2017-11-13 | Discharge: 2017-11-13 | Disposition: A | Discharge status: Home / Self Care | Payer: BLUE CROSS/BLUE SHIELD | Source: Ambulatory Visit | Attending: Internal Medicine | Admitting: Internal Medicine

## 2017-11-13 ENCOUNTER — Ambulatory Visit (HOSPITAL_COMMUNITY): Payer: BLUE CROSS/BLUE SHIELD

## 2017-11-13 DIAGNOSIS — Z951 Presence of aortocoronary bypass graft: Secondary | ICD-10-CM | POA: Diagnosis not present

## 2017-11-13 DIAGNOSIS — I4892 Unspecified atrial flutter: Secondary | ICD-10-CM | POA: Diagnosis not present

## 2017-11-13 DIAGNOSIS — I4891 Unspecified atrial fibrillation: Secondary | ICD-10-CM | POA: Diagnosis not present

## 2017-11-13 MED ORDER — IOPAMIDOL (ISOVUE-370) INJECTION 76%
100.0000 mL | Freq: Once | INTRAVENOUS | Status: AC | PRN
  Administered 2017-11-13: 100 mL via INTRAVENOUS

## 2017-11-13 MED ORDER — METOPROLOL TARTRATE 5 MG/5ML IV SOLN
5.0000 mg | INTRAVENOUS | Status: DC | PRN
  Administered 2017-11-13 (×2): 5 mg via INTRAVENOUS
  Filled 2017-11-13 (×2): qty 5

## 2017-11-13 NOTE — Progress Notes (Signed)
Patient ambulatory out of department with steady gait noted. Denies any complaints.  

## 2017-11-13 NOTE — Progress Notes (Signed)
CT complete. Patient denies any complaints. Offered patient snack and beverage, states he is going to eat after he leaves hospital.

## 2017-11-16 ENCOUNTER — Encounter (HOSPITAL_COMMUNITY): Payer: BLUE CROSS/BLUE SHIELD

## 2017-11-16 ENCOUNTER — Encounter (HOSPITAL_COMMUNITY): Admission: RE | Admit: 2017-11-16 | Payer: BLUE CROSS/BLUE SHIELD | Source: Ambulatory Visit

## 2017-11-18 ENCOUNTER — Encounter (HOSPITAL_COMMUNITY): Payer: BLUE CROSS/BLUE SHIELD

## 2017-11-18 ENCOUNTER — Telehealth: Payer: Self-pay | Admitting: Internal Medicine

## 2017-11-18 NOTE — Telephone Encounter (Signed)
° ° °  Patient calling for instructions for procedure on 11/14

## 2017-11-18 NOTE — Telephone Encounter (Signed)
Returned call to Pt.  Pt cannot find his AVS from recent office visit.  Reiterated following instructions:  ABLATION INSTRUCTIONS:  Please arrive at the [facility] main entrance of [facility] hospital at: 8:30 am on November 19, 2017 Do not eat or drink after midnight prior to procedure On the morning of your procedure do not take any medications. Plan for one night stay.   You will need someone to drive you home at discharge  Pt indicates understanding.  No further questions.

## 2017-11-19 ENCOUNTER — Ambulatory Visit (HOSPITAL_COMMUNITY): Payer: BLUE CROSS/BLUE SHIELD | Admitting: Anesthesiology

## 2017-11-19 ENCOUNTER — Encounter (HOSPITAL_COMMUNITY): Payer: Self-pay | Admitting: Anesthesiology

## 2017-11-19 ENCOUNTER — Other Ambulatory Visit: Payer: Self-pay

## 2017-11-19 ENCOUNTER — Ambulatory Visit (HOSPITAL_COMMUNITY)
Admission: RE | Admit: 2017-11-19 | Discharge: 2017-11-20 | Disposition: A | Discharge status: Home / Self Care | Payer: BLUE CROSS/BLUE SHIELD | Source: Ambulatory Visit | Attending: Internal Medicine | Admitting: Internal Medicine

## 2017-11-19 ENCOUNTER — Ambulatory Visit (HOSPITAL_COMMUNITY)
Admission: RE | Disposition: A | Discharge status: Home / Self Care | Payer: Self-pay | Source: Ambulatory Visit | Attending: Internal Medicine

## 2017-11-19 DIAGNOSIS — I10 Essential (primary) hypertension: Secondary | ICD-10-CM | POA: Diagnosis not present

## 2017-11-19 DIAGNOSIS — E785 Hyperlipidemia, unspecified: Secondary | ICD-10-CM | POA: Diagnosis not present

## 2017-11-19 DIAGNOSIS — Z823 Family history of stroke: Secondary | ICD-10-CM | POA: Diagnosis not present

## 2017-11-19 DIAGNOSIS — Z87442 Personal history of urinary calculi: Secondary | ICD-10-CM | POA: Diagnosis not present

## 2017-11-19 DIAGNOSIS — Z95820 Peripheral vascular angioplasty status with implants and grafts: Secondary | ICD-10-CM | POA: Insufficient documentation

## 2017-11-19 DIAGNOSIS — Z9889 Other specified postprocedural states: Secondary | ICD-10-CM | POA: Insufficient documentation

## 2017-11-19 DIAGNOSIS — Z7901 Long term (current) use of anticoagulants: Secondary | ICD-10-CM | POA: Diagnosis not present

## 2017-11-19 DIAGNOSIS — Z79899 Other long term (current) drug therapy: Secondary | ICD-10-CM | POA: Diagnosis not present

## 2017-11-19 DIAGNOSIS — I483 Typical atrial flutter: Secondary | ICD-10-CM | POA: Insufficient documentation

## 2017-11-19 DIAGNOSIS — E119 Type 2 diabetes mellitus without complications: Secondary | ICD-10-CM | POA: Diagnosis not present

## 2017-11-19 DIAGNOSIS — I252 Old myocardial infarction: Secondary | ICD-10-CM | POA: Diagnosis not present

## 2017-11-19 DIAGNOSIS — I4819 Other persistent atrial fibrillation: Secondary | ICD-10-CM | POA: Insufficient documentation

## 2017-11-19 DIAGNOSIS — Z8249 Family history of ischemic heart disease and other diseases of the circulatory system: Secondary | ICD-10-CM | POA: Diagnosis not present

## 2017-11-19 DIAGNOSIS — G510 Bell's palsy: Secondary | ICD-10-CM | POA: Diagnosis not present

## 2017-11-19 DIAGNOSIS — Z951 Presence of aortocoronary bypass graft: Secondary | ICD-10-CM | POA: Insufficient documentation

## 2017-11-19 DIAGNOSIS — Z955 Presence of coronary angioplasty implant and graft: Secondary | ICD-10-CM | POA: Diagnosis not present

## 2017-11-19 DIAGNOSIS — Z833 Family history of diabetes mellitus: Secondary | ICD-10-CM | POA: Diagnosis not present

## 2017-11-19 DIAGNOSIS — M1990 Unspecified osteoarthritis, unspecified site: Secondary | ICD-10-CM | POA: Diagnosis not present

## 2017-11-19 DIAGNOSIS — Z960 Presence of urogenital implants: Secondary | ICD-10-CM | POA: Diagnosis not present

## 2017-11-19 DIAGNOSIS — G4733 Obstructive sleep apnea (adult) (pediatric): Secondary | ICD-10-CM | POA: Insufficient documentation

## 2017-11-19 DIAGNOSIS — Z87891 Personal history of nicotine dependence: Secondary | ICD-10-CM | POA: Insufficient documentation

## 2017-11-19 DIAGNOSIS — Z86711 Personal history of pulmonary embolism: Secondary | ICD-10-CM | POA: Insufficient documentation

## 2017-11-19 DIAGNOSIS — I4891 Unspecified atrial fibrillation: Secondary | ICD-10-CM | POA: Diagnosis not present

## 2017-11-19 DIAGNOSIS — I2510 Atherosclerotic heart disease of native coronary artery without angina pectoris: Secondary | ICD-10-CM | POA: Diagnosis not present

## 2017-11-19 DIAGNOSIS — Z7982 Long term (current) use of aspirin: Secondary | ICD-10-CM | POA: Insufficient documentation

## 2017-11-19 HISTORY — PX: ATRIAL FIBRILLATION ABLATION: EP1191

## 2017-11-19 LAB — GLUCOSE, CAPILLARY
GLUCOSE-CAPILLARY: 127 mg/dL — AB (ref 70–99)
GLUCOSE-CAPILLARY: 127 mg/dL — AB (ref 70–99)
Glucose-Capillary: 172 mg/dL — ABNORMAL HIGH (ref 70–99)
Glucose-Capillary: 180 mg/dL — ABNORMAL HIGH (ref 70–99)

## 2017-11-19 LAB — POCT ACTIVATED CLOTTING TIME: Activated Clotting Time: 164 seconds

## 2017-11-19 SURGERY — ATRIAL FIBRILLATION ABLATION
Anesthesia: General

## 2017-11-19 MED ORDER — DULOXETINE HCL 60 MG PO CPEP
60.0000 mg | ORAL_CAPSULE | Freq: Every evening | ORAL | Status: DC
  Administered 2017-11-19: 18:00:00 60 mg via ORAL
  Filled 2017-11-19: qty 1

## 2017-11-19 MED ORDER — RIVAROXABAN 20 MG PO TABS
20.0000 mg | ORAL_TABLET | Freq: Every day | ORAL | Status: DC
  Administered 2017-11-19: 20 mg via ORAL
  Filled 2017-11-19: qty 1

## 2017-11-19 MED ORDER — SODIUM CHLORIDE 0.9 % IV SOLN: 250.0000 mL | INTRAVENOUS | Status: DC | PRN

## 2017-11-19 MED ORDER — HEPARIN SODIUM (PORCINE) 1000 UNIT/ML IJ SOLN
INTRAMUSCULAR | Status: AC
  Filled 2017-11-19: qty 1

## 2017-11-19 MED ORDER — HYDROCODONE-ACETAMINOPHEN 5-325 MG PO TABS
1.0000 | ORAL_TABLET | ORAL | Status: DC | PRN
  Administered 2017-11-20: 1 via ORAL
  Filled 2017-11-19: qty 1

## 2017-11-19 MED ORDER — METOPROLOL TARTRATE 12.5 MG HALF TABLET
37.5000 mg | ORAL_TABLET | Freq: Two times a day (BID) | ORAL | Status: DC
  Administered 2017-11-20: 37.5 mg via ORAL
  Filled 2017-11-19: qty 3

## 2017-11-19 MED ORDER — SUCCINYLCHOLINE CHLORIDE 20 MG/ML IJ SOLN
INTRAMUSCULAR | Status: DC | PRN
  Administered 2017-11-19: 80 mg via INTRAVENOUS

## 2017-11-19 MED ORDER — HEPARIN (PORCINE) IN NACL 1000-0.9 UT/500ML-% IV SOLN
INTRAVENOUS | Status: DC | PRN
  Administered 2017-11-19: 500 mL

## 2017-11-19 MED ORDER — VECURONIUM BROMIDE 10 MG IV SOLR
INTRAVENOUS | Status: DC | PRN
  Administered 2017-11-19 (×5): 2 mg via INTRAVENOUS
  Administered 2017-11-19: 1 mg via INTRAVENOUS

## 2017-11-19 MED ORDER — PROTAMINE SULFATE 10 MG/ML IV SOLN
INTRAVENOUS | Status: DC | PRN
  Administered 2017-11-19: 40 mg via INTRAVENOUS

## 2017-11-19 MED ORDER — FENTANYL CITRATE (PF) 100 MCG/2ML IJ SOLN
INTRAMUSCULAR | Status: DC | PRN
  Administered 2017-11-19: 50 ug via INTRAVENOUS
  Administered 2017-11-19: 100 ug via INTRAVENOUS

## 2017-11-19 MED ORDER — SODIUM CHLORIDE 0.9% FLUSH
3.0000 mL | Freq: Two times a day (BID) | INTRAVENOUS | Status: DC
  Administered 2017-11-19: 23:00:00 3 mL via INTRAVENOUS

## 2017-11-19 MED ORDER — BUPIVACAINE HCL (PF) 0.25 % IJ SOLN
INTRAMUSCULAR | Status: AC
  Filled 2017-11-19: qty 30

## 2017-11-19 MED ORDER — HEPARIN SODIUM (PORCINE) 1000 UNIT/ML IJ SOLN
INTRAMUSCULAR | Status: DC | PRN
  Administered 2017-11-19: 12000 [IU] via INTRAVENOUS
  Administered 2017-11-19: 1000 [IU] via INTRAVENOUS

## 2017-11-19 MED ORDER — LIDOCAINE 2% (20 MG/ML) 5 ML SYRINGE
INTRAMUSCULAR | Status: DC | PRN
  Administered 2017-11-19: 80 mg via INTRAVENOUS

## 2017-11-19 MED ORDER — DEXAMETHASONE SODIUM PHOSPHATE 10 MG/ML IJ SOLN
INTRAMUSCULAR | Status: DC | PRN
  Administered 2017-11-19: 10 mg via INTRAVENOUS

## 2017-11-19 MED ORDER — MIDAZOLAM HCL 5 MG/5ML IJ SOLN
INTRAMUSCULAR | Status: DC | PRN
  Administered 2017-11-19: 2 mg via INTRAVENOUS

## 2017-11-19 MED ORDER — SODIUM CHLORIDE 0.9 % IV SOLN
INTRAVENOUS | Status: DC | PRN
  Administered 2017-11-19: 25 ug/min via INTRAVENOUS

## 2017-11-19 MED ORDER — INFLUENZA VAC SPLIT QUAD 0.5 ML IM SUSY: 0.5000 mL | PREFILLED_SYRINGE | INTRAMUSCULAR | Status: DC

## 2017-11-19 MED ORDER — OFF THE BEAT BOOK
Freq: Once | Status: AC
  Administered 2017-11-19: 19:00:00
  Filled 2017-11-19: qty 1

## 2017-11-19 MED ORDER — PROPOFOL 10 MG/ML IV BOLUS
INTRAVENOUS | Status: DC | PRN
  Administered 2017-11-19: 40 mg via INTRAVENOUS
  Administered 2017-11-19: 160 mg via INTRAVENOUS

## 2017-11-19 MED ORDER — SODIUM CHLORIDE 0.9 % IV SOLN
INTRAVENOUS | Status: DC
  Administered 2017-11-19 (×2): via INTRAVENOUS

## 2017-11-19 MED ORDER — HEPARIN SODIUM (PORCINE) 1000 UNIT/ML IJ SOLN
INTRAMUSCULAR | Status: DC | PRN
  Administered 2017-11-19 (×2): 5000 [IU] via INTRAVENOUS

## 2017-11-19 MED ORDER — SODIUM CHLORIDE 0.9% FLUSH: 3.0000 mL | INTRAVENOUS | Status: DC | PRN

## 2017-11-19 MED ORDER — SUGAMMADEX SODIUM 200 MG/2ML IV SOLN
INTRAVENOUS | Status: DC | PRN
  Administered 2017-11-19 (×2): 200 mg via INTRAVENOUS

## 2017-11-19 MED ORDER — ASPIRIN EC 81 MG PO TBEC
81.0000 mg | DELAYED_RELEASE_TABLET | Freq: Every day | ORAL | Status: DC
  Administered 2017-11-20: 81 mg via ORAL
  Filled 2017-11-19: qty 1

## 2017-11-19 MED ORDER — ONDANSETRON HCL 4 MG/2ML IJ SOLN
INTRAMUSCULAR | Status: DC | PRN
  Administered 2017-11-19: 4 mg via INTRAVENOUS

## 2017-11-19 MED ORDER — ACETAMINOPHEN 325 MG PO TABS
650.0000 mg | ORAL_TABLET | ORAL | Status: DC | PRN
  Filled 2017-11-19: qty 2

## 2017-11-19 MED ORDER — PANTOPRAZOLE SODIUM 40 MG PO TBEC
40.0000 mg | DELAYED_RELEASE_TABLET | Freq: Every day | ORAL | Status: DC
  Administered 2017-11-19 – 2017-11-20 (×2): 40 mg via ORAL
  Filled 2017-11-19 (×2): qty 1

## 2017-11-19 MED ORDER — HEPARIN (PORCINE) IN NACL 1000-0.9 UT/500ML-% IV SOLN
INTRAVENOUS | Status: AC
  Filled 2017-11-19: qty 500

## 2017-11-19 MED ORDER — ONDANSETRON HCL 4 MG/2ML IJ SOLN: 4.0000 mg | Freq: Four times a day (QID) | INTRAMUSCULAR | Status: DC | PRN

## 2017-11-19 MED ORDER — BUPIVACAINE HCL (PF) 0.25 % IJ SOLN
INTRAMUSCULAR | Status: DC | PRN
  Administered 2017-11-19: 30 mL

## 2017-11-19 SURGICAL SUPPLY — 18 items
BLANKET WARM UNDERBOD FULL ACC (MISCELLANEOUS) ×3 IMPLANT
CATH MAPPNG PENTARAY F 2-6-2MM (CATHETERS) ×1 IMPLANT
CATH NAVISTAR SMARTTOUCH DF (ABLATOR) ×3 IMPLANT
CATH SOUNDSTAR ECO REPROCESSED (CATHETERS) ×3 IMPLANT
CATH WEBSTER BI DIR CS D-F CRV (CATHETERS) ×3 IMPLANT
COVER SWIFTLINK CONNECTOR (BAG) ×3 IMPLANT
HOVERMATT SINGLE USE (MISCELLANEOUS) ×3 IMPLANT
NEEDLE BAYLIS TRANSSEPTAL 71CM (NEEDLE) ×3 IMPLANT
PACK EP LATEX FREE (CUSTOM PROCEDURE TRAY) ×2
PACK EP LF (CUSTOM PROCEDURE TRAY) ×1 IMPLANT
PAD PRO RADIOLUCENT 2001M-C (PAD) ×3 IMPLANT
PATCH CARTO3 (PAD) ×3 IMPLANT
PENTARAY F 2-6-2MM (CATHETERS) ×3
SHEATH AVANTI 11F 11CM (SHEATH) ×3 IMPLANT
SHEATH PINNACLE 7F 10CM (SHEATH) ×6 IMPLANT
SHEATH PINNACLE 9F 10CM (SHEATH) ×3 IMPLANT
SHEATH SWARTZ TS SL2 63CM 8.5F (SHEATH) ×3 IMPLANT
TUBING SMART ABLATE COOLFLOW (TUBING) ×3 IMPLANT

## 2017-11-19 NOTE — Anesthesia Postprocedure Evaluation (Signed)
Anesthesia Post Note  Patient: Paul Todd  Procedure(s) Performed: ATRIAL FIBRILLATION ABLATION (N/A )     Patient location during evaluation: PACU Anesthesia Type: General Level of consciousness: awake and alert Pain management: pain level controlled Vital Signs Assessment: post-procedure vital signs reviewed and stable Respiratory status: spontaneous breathing, nonlabored ventilation, respiratory function stable and patient connected to nasal cannula oxygen Cardiovascular status: blood pressure returned to baseline and stable Postop Assessment: no apparent nausea or vomiting Anesthetic complications: no    Last Vitals:  Vitals:   11/19/17 1630 11/19/17 1650  BP: (!) 94/40 (!) 112/58  Pulse:  93  Resp: 20   Temp:  (!) 36.4 C  SpO2: 95% 94%    Last Pain:  Vitals:   11/19/17 1700  TempSrc:   PainSc: 0-No pain                 Zyheir Daft DAVID

## 2017-11-19 NOTE — Anesthesia Procedure Notes (Signed)
Procedure Name: Intubation Date/Time: 11/19/2017 12:07 PM Performed by: Jenne Campus, CRNA Pre-anesthesia Checklist: Patient identified, Emergency Drugs available, Suction available and Patient being monitored Patient Re-evaluated:Patient Re-evaluated prior to induction Oxygen Delivery Method: Circle System Utilized Preoxygenation: Pre-oxygenation with 100% oxygen Induction Type: IV induction Ventilation: Mask ventilation without difficulty Laryngoscope Size: Miller and 2 Grade View: Grade II Tube type: Oral Tube size: 7.5 mm Number of attempts: 1 Airway Equipment and Method: Stylet and Oral airway Placement Confirmation: ETT inserted through vocal cords under direct vision,  positive ETCO2 and breath sounds checked- equal and bilateral Secured at: 21 cm Tube secured with: Tape Dental Injury: Teeth and Oropharynx as per pre-operative assessment

## 2017-11-19 NOTE — Progress Notes (Signed)
CPAP set up for pt with home settings Auto CPAP (Min 10cmH20-Max 20cmH20), with large FFM, 21%. Advised pt to have RT notified if any assistance is needed placing mask. RT will continue to monitor.

## 2017-11-19 NOTE — Transfer of Care (Signed)
Immediate Anesthesia Transfer of Care Note  Patient: Paul Todd  Procedure(s) Performed: ATRIAL FIBRILLATION ABLATION (N/A )  Patient Location: PACU and Cath Lab  Anesthesia Type:General  Level of Consciousness: drowsy and patient cooperative  Airway & Oxygen Therapy: Patient Spontanous Breathing and Patient connected to face mask oxygen  Post-op Assessment: Report given to RN and Post -op Vital signs reviewed and stable  Post vital signs: Reviewed and stable  Last Vitals:  Vitals Value Taken Time  BP 111/61 11/19/2017  3:45 PM  Temp 36.6 C 11/19/2017  3:41 PM  Pulse 78 11/19/2017  3:47 PM  Resp 15 11/19/2017  3:47 PM  SpO2 92 % 11/19/2017  3:47 PM  Vitals shown include unvalidated device data.  Last Pain:  Vitals:   11/19/17 1541  TempSrc: Temporal  PainSc: 0-No pain      Patients Stated Pain Goal: 3 (18/40/37 5436)  Complications: No apparent anesthesia complications

## 2017-11-19 NOTE — Progress Notes (Addendum)
Site area: RFV x3 Site Prior to Removal:  Level 0 Pressure Applied For:30 min Manual:   yes Patient Status During Pull:   Post Pull Site:  Level Post Pull Instructions Given:   Post Pull Pulses Present: palpable Dressing Applied:  clear Bedrest begins @ 1630 till 2230 Comments: removed by Dalbert Batman

## 2017-11-19 NOTE — H&P (Signed)
PCP:  Everardo Beals, NP  Cardiologist:  Dr Debara Pickett Primary Electrophysiologist: Thompson Grayer, MD       CC: afib/ atrial flutter   History of Present Illness: Paul Todd is a 57 y.o. male who presents today for electrophysiology study and ablation for afib and atrial flutter.   He has a h/o persistent afib and typical atrial flutter.  He is recently s/p CABG, without MAZE.  He has had increasing atrial arrhythmias.  He has fatigue and lightheadedness with his atrial arrhythmias.  He has required cardioversion 2/15, 7/16, 8/19, and 9/19.   he has failed medical therapy with tikosyn and amiodarone.  Today, he denies symptoms of palpitations, chest pain, shortness of breath, orthopnea, PND, lower extremity edema, claudication, dizziness, presyncope, syncope, bleeding, or neurologic sequela. The patient is tolerating medications without difficulties and is otherwise without complaint today.        Past Medical History:  Diagnosis Date  . Arthritis       . Atrial flutter ([place])   . CAD (coronary artery disease)    multivessel per cath 02/12/13, s/p stenting to RCA  . Chronic lower back pain   . Diabetes mellitus without complication ([place])   . Dyslipidemia   . History of kidney stones   . Hx of Bell's palsy   . Hypertension   . Myocardial infarction ([place]) 02/12/2013   . Nephrolithiasis   . Neuromuscular disorder (HCC)    Bell's Palsy  . OSA on CPAP   . PE (pulmonary thromboembolism) ([place]) 02/2014  . Persistent atrial fibrillation             Past Surgical History:  Procedure Laterality Date  . ABDOMINAL AORTOGRAM W/LOWER EXTREMITY N/A 01/26/2017   Procedure: ABDOMINAL AORTOGRAM W/LOWER EXTREMITY;  Surgeon: Angelia Mould, MD;  Location: [place] [facility];  Service: Cardiovascular;  Laterality: N/A;  . BACK SURGERY  2017   laminectomy  . CARDIOVERSION N/A 02/15/2013   Procedure: CARDIOVERSION;  Surgeon: Thayer Headings, MD;  Location: [place];   Service: Cardiovascular;  Laterality: N/A;  . CARDIOVERSION N/A 07/07/2014   Procedure: CARDIOVERSION;  Surgeon: Pixie Casino, MD;  Location: [facility] ENDOSCOPY;  Service: Cardiovascular;  Laterality: N/A;  . CARDIOVERSION N/A 08/31/2017   Procedure: CARDIOVERSION;  Surgeon: Dorothy Spark, MD;  Location: [facility] ENDOSCOPY;  Service: Cardiovascular;  Laterality: N/A;  . CARDIOVERSION N/A 09/29/2017   Procedure: CARDIOVERSION;  Surgeon: Sanda Klein, MD;  Location: [place];  Service: Cardiovascular;  Laterality: N/A;  . CORONARY ANGIOPLASTY WITH STENT PLACEMENT  02/12/2013  . CORONARY ARTERY BYPASS GRAFT N/A 08/24/2017   Procedure: CORONARY ARTERY BYPASS GRAFTING (CABG) X3. RIGHT ENDOSCOPIC SAPHENOUS VEIN HARVEST. MAMM TO LAD, SVG TO OM, SVG TO RAMUS;  Surgeon: Ivin Poot, MD;  Location: [place];  Service: Open Heart Surgery;  Laterality: N/A;  . CYSTOSCOPY WITH RETROGRADE PYELOGRAM, URETEROSCOPY AND STENT PLACEMENT Left 09/02/2013   Procedure: CYSTOSCOPY WITH RETROGRADE PYELOGRAM/LEFT URETEROSCOPY/URETERAL STENT, with fulguration;  Surgeon: Festus Aloe, MD;  Location: [facility];  Service: Urology;  Laterality: Left;  . CYSTOSCOPY WITH URETEROSCOPY AND STENT PLACEMENT Left 09/16/2013   Procedure: CYSTOSCOPY WITH URETEROSCOPY AND STENT PLACEMENT;  Surgeon: Festus Aloe, MD;  Location: [facility];  Service: Urology;  Laterality: Left;  . FEMORAL BYPASS Left 04/07/2017   below the knee popliteal  . FEMORAL-POPLITEAL BYPASS GRAFT Left 04/07/2017   Procedure: LEFT FEMORAL-BELOW KNEE POPLITEAL ARTERY BYPASS USING COMPOSITE LEFT GREATER SAPHENOUS VEIN;  Surgeon: Angelia Mould, MD;  Location: [facility];  Service: Vascular;  Laterality: Left;  . HOLMIUM LASER APPLICATION Left 6/60/6301   Procedure: HOLMIUM LASER APPLICATION;  Surgeon: Festus Aloe, MD;  Location: [facility];  Service: Urology;  Laterality: Left;  . INTRAVASCULAR PRESSURE WIRE/FFR STUDY N/A 08/21/2017   Procedure:  INTRAVASCULAR PRESSURE WIRE/FFR STUDY;  Surgeon: Sherren Mocha, MD;  Location: [place] [facility];  Service: Cardiovascular;  Laterality: N/A;  . LEFT HEART CATH AND CORONARY ANGIOGRAPHY N/A 08/21/2017   Procedure: LEFT HEART CATH AND CORONARY ANGIOGRAPHY;  Surgeon: Sherren Mocha, MD;  Location: [place] [facility];  Service: Cardiovascular;  Laterality: N/A;  . LEFT HEART CATHETERIZATION WITH CORONARY ANGIOGRAM N/A 02/12/2013   Procedure: LEFT HEART CATHETERIZATION WITH CORONARY ANGIOGRAM;  Surgeon: Troy Sine, MD;  Location: [facility] CATH LAB;  Service: Cardiovascular;  Laterality: N/A;  . PERCUTANEOUS CORONARY STENT INTERVENTION (PCI-S)  02/12/2013   Procedure: PERCUTANEOUS CORONARY STENT INTERVENTION (PCI-S);  Surgeon: Troy Sine, MD;  Location: [facility] CATH LAB;  Service: Cardiovascular;;  STEMI  . PERCUTANEOUS CORONARY STENT INTERVENTION (PCI-S) N/A 02/14/2013   Procedure: PERCUTANEOUS CORONARY STENT INTERVENTION (PCI-S);  Surgeon: Burnell Blanks, MD;  Location: [facility] CATH LAB;  Service: Cardiovascular;  Laterality: N/A;  Proximal LAD  . TEE WITHOUT CARDIOVERSION N/A 02/15/2013   Procedure: TRANSESOPHAGEAL ECHOCARDIOGRAM (TEE);  Surgeon: Thayer Headings, MD;  Location: [place];  Service: Cardiovascular;  Laterality: N/A;  . TEE WITHOUT CARDIOVERSION N/A 08/24/2017   Procedure: TRANSESOPHAGEAL ECHOCARDIOGRAM (TEE);  Surgeon: Prescott Gum, Collier Salina, MD;  Location: [place];  Service: Open Heart Surgery;  Laterality: N/A;  . TEE WITHOUT CARDIOVERSION N/A 08/31/2017   Procedure: TRANSESOPHAGEAL ECHOCARDIOGRAM (TEE);  Surgeon: Dorothy Spark, MD;  Location: [facility] ENDOSCOPY;  Service: Cardiovascular;  Laterality: N/A;           Current Outpatient Medications  Medication Sig Dispense Refill  . ACCU-CHEK AVIVA PLUS test strip USE UP TO 4 TIMES DAILY AS DIRECTED 120 each 0  . ACCU-CHEK SOFTCLIX LANCETS lancets USE UP TO 4 TIMES DAILY AS DIRECTED 100 each 0  . acetaminophen (TYLENOL) 650 MG CR  tablet Take 1,300 mg by mouth 2 (two) times daily.     Marland Kitchen albuterol (PROVENTIL HFA;VENTOLIN HFA) 108 (90 Base) MCG/ACT inhaler Inhale 2 puffs into the lungs every 4 (four) hours as needed for wheezing or shortness of breath.     Marland Kitchen amiodarone (PACERONE) 200 MG tablet Take 1 tablet (200 mg total) by mouth 2 (two) times daily. 60 tablet 3  . aspirin EC 81 MG tablet Take 81 mg by mouth daily.    Marland Kitchen atorvastatin (LIPITOR) 80 MG tablet Take 80 mg by mouth daily.    . blood glucose meter kit and supplies KIT Dispense based on patient and insurance preference. Use up to four times daily as directed. (FOR ICD-9 250.00, 250.01). 1 each 0  . diltiazem (CARDIZEM SR) 120 MG 12 hr capsule Take 1 capsule (120 mg total) by mouth every 12 (twelve) hours. 60 capsule 3  . docusate sodium (COLACE) 100 MG capsule Take 100 mg by mouth daily.   5  . DULoxetine (CYMBALTA) 60 MG capsule Take 60 mg by mouth every evening.    . gabapentin (NEURONTIN) 300 MG capsule Take 1 capsule (300 mg total) by mouth 2 (two) times daily. 60 capsule 0  . JARDIANCE 10 MG TABS tablet Take 10 mg by mouth daily.  3  . magnesium oxide (MAG-OX) 400 (241.3 MG) MG tablet Take 1 tablet (400 mg  total) by mouth daily. (Patient taking differently: Take 250 mg by mouth daily. ) 30 tablet 11  . Metoprolol Tartrate 37.5 MG TABS Take 37.5 mg by mouth 2 (two) times daily.    . Multiple Vitamin (MULTIVITAMIN WITH MINERALS) TABS tablet Take 1 tablet by mouth daily.     . nitroGLYCERIN (NITROSTAT) 0.4 MG SL tablet PLACE 1 TABLET UNDER TONGUE EVERY 5 MINUTES AS NEEDED FOR CHEST PAIN MAX 3 TABLETS (Patient taking differently: Place 0.4 mg under the tongue every 5 (five) minutes as needed for chest pain. ) 25 tablet 3  . NON FORMULARY at bedtime. CPAP    . pantoprazole (PROTONIX) 40 MG tablet Take 40 mg by mouth daily.    . Potassium 99 MG TABS Take 1 tablet by mouth daily.     . rivaroxaban (XARELTO) 20 MG TABS tablet Take 1 tablet (20 mg  total) daily with supper by mouth. 90 tablet 3   No current facility-administered medications for this visit.     Allergies:   Patient has no known allergies.   Social History:  The patient  reports that he quit smoking about 4 years ago. His smoking use included cigarettes. He has a 17.50 pack-year smoking history. He has never used smokeless tobacco. He reports that he does not drink alcohol or use drugs.   Family History:  The patient's  family history includes Diabetes in his father and mother; Heart disease in his mother; Stroke in his mother.    ROS:  Please see the history of present illness.   All other systems are personally reviewed and negative.    PHYSICAL EXAM: Vitals:   11/19/17 0841  BP: 114/66  Pulse: (!) 36  Temp: 98.5 F (36.9 C)  SpO2: 99%    GEN: Well nourished, well developed, in no acute distress  HEENT: normal  Neck: no JVD, carotid bruits, or masses Cardiac: iRRR; no murmurs, rubs, or gallops,no edema  Respiratory:  clear to auscultation bilaterally, normal work of breathing GI: soft, nontender, nondistended, + BS MS: no deformity or atrophy  Skin: warm and dry  Neuro:  Strength and sensation are intact Psych: euthymic mood, full affect    Recent Labs: 08/22/2017: ALT 41; TSH 0.819 09/02/2017: Magnesium 2.0 10/09/2017: BUN 17; Creatinine, Ser 0.96; Hemoglobin 11.9; Platelets 367; Potassium 4.1; Sodium 139  personally reviewed   Lipid Panel  Labs(Brief)          Component Value Date/Time   CHOL 88 08/29/2017 0413   CHOL 195 01/28/2013 0939   TRIG 118 08/29/2017 0413   TRIG 206 (H) 01/28/2013 0939   HDL 23 (L) 08/29/2017 0413   HDL 43 01/28/2013 0939   CHOLHDL 3.8 08/29/2017 0413   VLDL 24 08/29/2017 0413   LDLCALC 41 08/29/2017 0413   LDLCALC 111 (H) 01/28/2013 0939     personally reviewed      Wt Readings from Last 3 Encounters:  10/19/17 244 lb 9.6 oz (110.9 kg)  10/08/17 243 lb (110.2 kg)  09/30/17 246  lb (111.6 kg)      ASSESSMENT AND PLAN:  1.  Persistent atrial fibrillation/ typical atrial flutter The patient has symptomatic, recurrent persistent atrial fibrillation and atrial flutter. he has failed medical therapy with tikosyn and amiodarone. Chads2vasc score is 3.  he is anticoagulated with xarelto .  Risk, benefits, and alternatives to EP study and radiofrequency ablation  were also discussed in detail today. These risks include but are not limited to stroke, bleeding, vascular damage,  tamponade, perforation, damage to the esophagus, lungs, and other structures, pulmonary vein stenosis, worsening renal function, and death. The patient understands these risk and wishes to proceed.  We will therefore proceed with catheter ablation at this time.   He reports compliance with xarelto without interruption. Cardiac CT reviewed in detail with patient and spouse today.  Thompson Grayer MD, [facility] 11/19/2017 11:27 AM

## 2017-11-19 NOTE — Anesthesia Preprocedure Evaluation (Addendum)
Anesthesia Evaluation  Patient identified by MRN, date of birth, ID band Patient awake    Reviewed: Allergy & Precautions, NPO status , Patient's Chart, lab work & pertinent test results, reviewed documented beta blocker date and time   History of Anesthesia Complications Negative for: history of anesthetic complications  Airway Mallampati: II  TM Distance: >3 FB Neck ROM: Full    Dental  (+) Teeth Intact, Dental Advisory Given   Pulmonary sleep apnea , former smoker,    breath sounds clear to auscultation       Cardiovascular hypertension, Pt. on medications and Pt. on home beta blockers (-) angina+ CAD, + Past MI, + Cardiac Stents, + CABG and + Peripheral Vascular Disease  (-) CHF + dysrhythmias Atrial Fibrillation  Rhythm:Irregular     Neuro/Psych  Neuromuscular disease    GI/Hepatic negative GI ROS, Neg liver ROS,   Endo/Other  diabetes, Type 2, Oral Hypoglycemic AgentsMorbid obesity  Renal/GU Renal disease     Musculoskeletal  (+) Arthritis ,   Abdominal   Peds  Hematology  (+) anemia ,   Anesthesia Other Findings 8/19 tte: - Left ventricle: The cavity size was normal. Wall thickness was   normal. Systolic function was normal. The estimated ejection   fraction was in the range of 55% to 60%. No evidence of thrombus. - Mitral valve: No evidence of vegetation. - Left atrium: No evidence of thrombus in the atrial cavity or   appendage. No evidence of thrombus in the atrial cavity or   appendage. - Right ventricle: The cavity size was normal. Wall thickness was   normal. Systolic function was normal. - Right atrium: No evidence of thrombus in the atrial cavity or   appendage. - Atrial septum: Hypermobile interatrial septum.  Reproductive/Obstetrics                           Anesthesia Physical Anesthesia Plan  ASA: III  Anesthesia Plan: General   Post-op Pain Management:     Induction: Intravenous  PONV Risk Score and Plan: 2 and Ondansetron and Dexamethasone  Airway Management Planned: Oral ETT  Additional Equipment: None  Intra-op Plan:   Post-operative Plan: Extubation in OR  Informed Consent: I have reviewed the patients History and Physical, chart, labs and discussed the procedure including the risks, benefits and alternatives for the proposed anesthesia with the patient or authorized representative who has indicated his/her understanding and acceptance.   Dental advisory given  Plan Discussed with: CRNA  Anesthesia Plan Comments:         Anesthesia Quick Evaluation

## 2017-11-19 NOTE — Discharge Summary (Addendum)
ELECTROPHYSIOLOGY PROCEDURE DISCHARGE SUMMARY    Patient ID: Paul Todd,  MRN: [id number], DOB/AGE: 58-Jun-1962 58 y.o.  Admit date: 11/19/2017 Discharge date: 11/20/17  Primary Care Physician: Everardo Beals, NP Primary Cardiologist: [name] Electrophysiologist: Thompson Grayer, MD  Primary Discharge Diagnosis:  1. Persistent afib 2. Atrial flutter (typical)     CHA2DS2Vasc is 3, on Xarelto, appropriately dosed  Secondary Discharge Diagnosis:  1. CAD 2. HTN 3. HLD 4. DM  Procedures This Admission:  1.  Electrophysiology study and radiofrequency catheter ablation on 11/19/17 by Dr Thompson Grayer.  This study demonstrated   CONCLUSIONS: 1. Counter clockwise isthmus dependant right upon presentation successfully ablated along the CTI.  2. Intracardiac echo reveals a severe right atrium enlargement and a moderately enlarged left atrium. 3. Successful electrical isolation and anatomical encircling of all four pulmonary veins with radiofrequency current. 4. Additional mapping and ablation within the right atrium (CAFEs) due to persistence of atrial fibrillation  5. Cavo-tricuspid isthmus ablation performed with complete bidirectional isthmus block achieved 6. No inducible arrhythmias following ablation 7. No early apparent complications.   Brief HPI: Paul Todd is a 57 y.o. male with a history of persistent atrial fibrillation and Aflutter.  She has failed medical therapy with amiodarone. Risks, benefits, and alternatives to catheter ablation of atrial fibrillation were reviewed with the patient who wished to proceed.  The patient underwent cardiac CT prior to the procedure which demonstrated no LAA thrombus.    Hospital Course:  The patient was admitted and underwent EPS/RFCA of atrial fibrillation with details as outlined above.  She was monitored on telemetry overnight which demonstrated SR.  R groin was without complication on the day of discharge.  The patient  feels well this morning, no CP or SOB, he denies any procedure site pain.  He was examined by Dr. Rayann Heman and considered to be stable for discharge.  Wound care and restrictions were reviewed with the patient.  The patient will be seen back by Roderic Palau, NP in 4 weeks and Dr Rayann Heman in 12 weeks for post ablation follow up.     Physical Exam: Vitals:   11/19/17 1650 11/19/17 2000 11/20/17 0528 11/20/17 0831  BP: (!) 112/58 138/71 (!) 157/67 138/77  Pulse: 93 93 90 86  Resp:  (!) '21 17 18  '$ Temp: (!) 97.5 F (36.4 C) 97.9 F (36.6 C) 97.6 F (36.4 C) 97.7 F (36.5 C)  TempSrc: Oral Oral Oral   SpO2: 94% 95% 97% 99%  Weight:   110 kg   Height:        GEN- The patient is well appearing, alert and oriented x 3 today.   HEENT: normocephalic, atraumatic; sclera clear, conjunctiva pink; hearing intact; oropharynx clear; neck supple  Lungs- CTA b/l, normal work of breathing.  No wheezes, rales, rhonchi Heart-  RRR, no murmurs, rubs or gallops  GI- soft, non-tender, non-distended  Extremities- no clubbing, cyanosis, or edema; DP/PT 2+ bilaterally, R groin without hematoma/bruit MS- no significant deformity or atrophy Skin- warm and dry, no rash or lesion Psych- euthymic mood, full affect Neuro- strength and sensation are intact   Labs:   Lab Results  Component Value Date   WBC 7.9 11/06/2017   HGB 11.3 (L) 11/06/2017   HCT 37.4 (L) 11/06/2017   MCV 82 11/06/2017   PLT 328 11/06/2017   No results for input(s): NA, K, CL, CO2, BUN, CREATININE, CALCIUM, PROT, BILITOT, ALKPHOS, ALT, AST, GLUCOSE in the last 168 hours.  Invalid input(s): LABALBU   Discharge Medications:  Allergies as of 11/20/2017   No Known Allergies     Medication List    STOP taking these medications   diltiazem 120 MG 12 hr capsule Commonly known as:  CARDIZEM SR     TAKE these medications   ACCU-CHEK AVIVA PLUS test strip Generic drug:  glucose blood USE UP TO 4 TIMES DAILY AS DIRECTED     ACCU-CHEK SOFTCLIX LANCETS lancets USE UP TO 4 TIMES DAILY AS DIRECTED   acetaminophen 650 MG CR tablet Commonly known as:  TYLENOL Take 1,300 mg by mouth every 8 (eight) hours as needed for pain.   amiodarone 200 MG tablet Commonly known as:  PACERONE Take 1 tablet (200 mg total) by mouth daily.   aspirin EC 81 MG tablet Take 81 mg by mouth daily.   atorvastatin 80 MG tablet Commonly known as:  LIPITOR Take 80 mg by mouth daily.   blood glucose meter kit and supplies Kit Dispense based on patient and insurance preference. Use up to four times daily as directed. (FOR ICD-9 250.00, 250.01).   docusate sodium 100 MG capsule Commonly known as:  COLACE Take 100 mg by mouth daily.   DULoxetine 60 MG capsule Commonly known as:  CYMBALTA Take 60 mg by mouth every evening.   JARDIANCE 10 MG Tabs tablet Generic drug:  empagliflozin Take 10 mg by mouth daily.   Magnesium 250 MG Tabs Take 250 mg by mouth daily.   magnesium oxide 400 (241.3 Mg) MG tablet Commonly known as:  MAG-OX Take 1 tablet (400 mg total) by mouth daily.   metoprolol tartrate 25 MG tablet Commonly known as:  LOPRESSOR Take 1 tablet (25 mg total) by mouth 2 (two) times daily. What changed:    medication strength  how much to take   multivitamin with minerals Tabs tablet Take 1 tablet by mouth daily.   nitroGLYCERIN 0.4 MG SL tablet Commonly known as:  NITROSTAT PLACE 1 TABLET UNDER TONGUE EVERY 5 MINUTES AS NEEDED FOR CHEST PAIN MAX 3 TABLETS What changed:  See the new instructions.   NON FORMULARY at bedtime. CPAP   pantoprazole 40 MG tablet Commonly known as:  PROTONIX Take 1 tablet (40 mg total) by mouth daily.   Potassium 99 MG Tabs Take 99 mg by mouth daily.   rivaroxaban 20 MG Tabs tablet Commonly known as:  XARELTO Take 1 tablet (20 mg total) daily with supper by mouth.       Disposition:  Home  Discharge Instructions    Diet - low sodium heart healthy   Complete by:  As  directed    Increase activity slowly   Complete by:  As directed      Follow-up Information    [facility].   Specialty:  Cardiology Why:  12/14/17 @ 9:00AM Contact information: [address] [id number] [place] [address] [phone]       Thompson Grayer, MD Follow up.   Specialty:  Cardiology Why:  02/24/18 @ 9:15AM Contact information: [place] [address] [phone]           Duration of Discharge Encounter: Greater than 30 minutes including physician time.  Signed, Tommye Standard, PA-C 11/20/2017 10:47 AM  I have seen, examined the patient, and reviewed the above assessment and plan.  Changes to above are made where necessary.  On exam, RRR.  DC to home with routine follow-up.  Co Sign: Thompson Grayer, MD

## 2017-11-20 ENCOUNTER — Encounter (HOSPITAL_COMMUNITY): Payer: BLUE CROSS/BLUE SHIELD

## 2017-11-20 ENCOUNTER — Encounter (HOSPITAL_COMMUNITY): Payer: Self-pay | Admitting: Internal Medicine

## 2017-11-20 DIAGNOSIS — I10 Essential (primary) hypertension: Secondary | ICD-10-CM | POA: Diagnosis not present

## 2017-11-20 DIAGNOSIS — I4819 Other persistent atrial fibrillation: Secondary | ICD-10-CM

## 2017-11-20 DIAGNOSIS — Z951 Presence of aortocoronary bypass graft: Secondary | ICD-10-CM | POA: Diagnosis not present

## 2017-11-20 DIAGNOSIS — I2510 Atherosclerotic heart disease of native coronary artery without angina pectoris: Secondary | ICD-10-CM | POA: Diagnosis not present

## 2017-11-20 DIAGNOSIS — I252 Old myocardial infarction: Secondary | ICD-10-CM | POA: Diagnosis not present

## 2017-11-20 DIAGNOSIS — M1990 Unspecified osteoarthritis, unspecified site: Secondary | ICD-10-CM | POA: Diagnosis not present

## 2017-11-20 DIAGNOSIS — Z79899 Other long term (current) drug therapy: Secondary | ICD-10-CM | POA: Diagnosis not present

## 2017-11-20 DIAGNOSIS — E785 Hyperlipidemia, unspecified: Secondary | ICD-10-CM | POA: Diagnosis not present

## 2017-11-20 DIAGNOSIS — Z87891 Personal history of nicotine dependence: Secondary | ICD-10-CM | POA: Diagnosis not present

## 2017-11-20 DIAGNOSIS — G510 Bell's palsy: Secondary | ICD-10-CM | POA: Diagnosis not present

## 2017-11-20 DIAGNOSIS — I483 Typical atrial flutter: Secondary | ICD-10-CM | POA: Diagnosis not present

## 2017-11-20 DIAGNOSIS — Z7901 Long term (current) use of anticoagulants: Secondary | ICD-10-CM | POA: Diagnosis not present

## 2017-11-20 DIAGNOSIS — G4733 Obstructive sleep apnea (adult) (pediatric): Secondary | ICD-10-CM | POA: Diagnosis not present

## 2017-11-20 DIAGNOSIS — Z955 Presence of coronary angioplasty implant and graft: Secondary | ICD-10-CM | POA: Diagnosis not present

## 2017-11-20 DIAGNOSIS — Z7982 Long term (current) use of aspirin: Secondary | ICD-10-CM | POA: Diagnosis not present

## 2017-11-20 DIAGNOSIS — E119 Type 2 diabetes mellitus without complications: Secondary | ICD-10-CM | POA: Diagnosis not present

## 2017-11-20 LAB — GLUCOSE, CAPILLARY: GLUCOSE-CAPILLARY: 142 mg/dL — AB (ref 70–99)

## 2017-11-20 LAB — POCT ACTIVATED CLOTTING TIME
ACTIVATED CLOTTING TIME: 191 s
Activated Clotting Time: 213 seconds

## 2017-11-20 MED ORDER — METOPROLOL TARTRATE 25 MG PO TABS: 25.0000 mg | ORAL_TABLET | Freq: Two times a day (BID) | ORAL | 6 refills | Status: AC

## 2017-11-20 MED FILL — Heparin Sod (Porcine)-NaCl IV Soln 1000 Unit/500ML-0.9%: INTRAVENOUS | Qty: 500 | Status: AC

## 2017-11-20 NOTE — Discharge Instructions (Signed)
Post procedure care instructions No driving for 4 days. No lifting over 5 lbs for 1 week. No vigorous or sexual activity for 1 week. You may return to work on 11/26/17. Keep procedure site clean & dry. If you notice increased pain, swelling, bleeding or pus, call/return!  You may shower, but no soaking baths/hot tubs/pools for 1 week.    You have an appointment set up with the [facility].  Multiple studies have shown that being followed by a dedicated atrial fibrillation clinic in addition to the standard care you receive from your other physicians improves health. We believe that enrollment in the atrial fibrillation clinic will allow [place] to better care for you.   The phone number to the [facility] is [phone]. The clinic is staffed Monday through Friday from 8:30am to 5pm.  Parking Directions: The clinic is located in the Heart and Vascular Building connected to [facility]. 1)From [address] turn on to [address] and go to the 3rd entrance  (Heart and Vascular entrance) on the right. 2)Look to the right for Heart &Vascular Parking Garage. 3)A code for the entrance is required please call the clinic to receive this.   4)Take the elevators to the 1st floor. Registration is in the room with the glass walls at the end of the hallway.  If you have any trouble parking or locating the clinic, please dont hesitate to call [phone].  Information on my medicine - XARELTO (Rivaroxaban)  This medication education was reviewed with me or my healthcare representative as part of my discharge preparation.  The pharmacist that spoke with me during my hospital stay was:  Einar Grad, [facility]  Why was Xarelto prescribed for you? Xarelto was prescribed for you to reduce the risk of a blood clot forming that can cause a stroke if you have a medical condition called atrial fibrillation (a type of irregular heartbeat).  What do you need to know about  xarelto ? Take your Xarelto ONCE DAILY at the same time every day with your evening meal. If you have difficulty swallowing the tablet whole, you may crush it and mix in applesauce just prior to taking your dose.  Take Xarelto exactly as prescribed by your doctor and DO NOT stop taking Xarelto without talking to the doctor who prescribed the medication.  Stopping without other stroke prevention medication to take the place of Xarelto may increase your risk of developing a clot that causes a stroke.  Refill your prescription before you run out.  After discharge, you should have regular check-up appointments with your healthcare provider that is prescribing your Xarelto.  In the future your dose may need to be changed if your kidney function or weight changes by a significant amount.  What do you do if you miss a dose? If you are taking Xarelto ONCE DAILY and you miss a dose, take it as soon as you remember on the same day then continue your regularly scheduled once daily regimen the next day. Do not take two doses of Xarelto at the same time or on the same day.   Important Safety Information A possible side effect of Xarelto is bleeding. You should call your healthcare provider right away if you experience any of the following: ? Bleeding from an injury or your nose that does not stop. ? Unusual colored urine (red or dark brown) or unusual colored stools (red or black). ? Unusual bruising for unknown reasons. ? A serious fall or  if you hit your head (even if there is no bleeding).  Some medicines may interact with Xarelto and might increase your risk of bleeding while on Xarelto. To help avoid this, consult your healthcare provider or pharmacist prior to using any new prescription or non-prescription medications, including herbals, vitamins, non-steroidal anti-inflammatory drugs (NSAIDs) and supplements.  This website has more information on Xarelto: [email or web address]

## 2017-11-21 ENCOUNTER — Other Ambulatory Visit: Payer: Self-pay | Admitting: Physician Assistant

## 2017-11-23 ENCOUNTER — Encounter (HOSPITAL_COMMUNITY): Payer: BLUE CROSS/BLUE SHIELD

## 2017-11-25 ENCOUNTER — Encounter (HOSPITAL_COMMUNITY): Payer: BLUE CROSS/BLUE SHIELD

## 2017-11-26 ENCOUNTER — Encounter (HOSPITAL_COMMUNITY): Payer: Self-pay | Admitting: *Deleted

## 2017-11-26 DIAGNOSIS — Z951 Presence of aortocoronary bypass graft: Secondary | ICD-10-CM

## 2017-11-26 NOTE — Progress Notes (Signed)
Cardiac Individual Treatment Plan  Patient Details  Name: Paul Todd MRN: [id number] Date of Birth: 11-29-1960 Referring Provider:     CARDIAC REHAB PHASE II ORIENTATION from 11/10/2017 in [place]  Referring Provider  Paul Casino MD       Initial Encounter Date:    CARDIAC REHAB PHASE II ORIENTATION from 11/10/2017 in [place]  Date  11/10/17      Visit Diagnosis: 08/24/2017 S/P CABG (coronary artery bypass graft)  Patient's Home Medications on Admission:  Current Outpatient Medications:  .  ACCU-CHEK AVIVA PLUS test strip, USE UP TO 4 TIMES DAILY AS DIRECTED, Disp: 120 each, Rfl: 0 .  ACCU-CHEK SOFTCLIX LANCETS lancets, USE UP TO 4 TIMES DAILY AS DIRECTED, Disp: 100 each, Rfl: 0 .  acetaminophen (TYLENOL) 650 MG CR tablet, Take 1,300 mg by mouth every 8 (eight) hours as needed for pain. , Disp: , Rfl:  .  amiodarone (PACERONE) 200 MG tablet, Take 1 tablet (200 mg total) by mouth daily., Disp: 90 tablet, Rfl: 3 .  aspirin EC 81 MG tablet, Take 81 mg by mouth daily., Disp: , Rfl:  .  atorvastatin (LIPITOR) 80 MG tablet, Take 80 mg by mouth daily., Disp: , Rfl:  .  blood glucose meter kit and supplies KIT, Dispense based on patient and insurance preference. Use up to four times daily as directed. (FOR ICD-9 250.00, 250.01)., Disp: 1 each, Rfl: 0 .  docusate sodium (COLACE) 100 MG capsule, Take 100 mg by mouth daily. , Disp: , Rfl: 5 .  DULoxetine (CYMBALTA) 60 MG capsule, Take 60 mg by mouth every evening., Disp: , Rfl:  .  JARDIANCE 10 MG TABS tablet, Take 10 mg by mouth daily., Disp: , Rfl: 3 .  Magnesium 250 MG TABS, Take 250 mg by mouth daily., Disp: , Rfl:  .  magnesium oxide (MAG-OX) 400 (241.3 MG) MG tablet, Take 1 tablet (400 mg total) by mouth daily. (Patient not taking: Reported on 11/16/2017), Disp: 30 tablet, Rfl: 11 .  metoprolol tartrate (LOPRESSOR) 25 MG tablet, Take 1 tablet (25 mg total) by  mouth 2 (two) times daily., Disp: 60 tablet, Rfl: 6 .  Multiple Vitamin (MULTIVITAMIN WITH MINERALS) TABS tablet, Take 1 tablet by mouth daily. , Disp: , Rfl:  .  nitroGLYCERIN (NITROSTAT) 0.4 MG SL tablet, PLACE 1 TABLET UNDER TONGUE EVERY 5 MINUTES AS NEEDED FOR CHEST PAIN MAX 3 TABLETS (Patient taking differently: Place 0.4 mg under the tongue every 5 (five) minutes as needed for chest pain. ), Disp: 25 tablet, Rfl: 3 .  NON FORMULARY, at bedtime. CPAP, Disp: , Rfl:  .  pantoprazole (PROTONIX) 40 MG tablet, Take 1 tablet (40 mg total) by mouth daily., Disp: 90 tablet, Rfl: 1 .  Potassium 99 MG TABS, Take 99 mg by mouth daily. , Disp: , Rfl:  .  rivaroxaban (XARELTO) 20 MG TABS tablet, Take 1 tablet (20 mg total) daily with supper by mouth., Disp: 90 tablet, Rfl: 3  Past Medical History: Past Medical History:  Diagnosis Date  . Arthritis       . Atrial flutter ([place])   . CAD (coronary artery disease)    multivessel per cath 02/12/13, s/p stenting to RCA  . Chronic lower back pain   . Diabetes mellitus without complication ([place])   . Dyslipidemia   . History of kidney stones   . Hx of Bell's palsy   . Hypertension   .  Myocardial infarction ([place]) 02/12/2013   . Nephrolithiasis   . Neuromuscular disorder ([facility])    Bell's Palsy  . OSA on CPAP   . PE (pulmonary thromboembolism) ([place]) 02/2014  . Persistent atrial fibrillation         Tobacco Use: Social History   Tobacco Use  Smoking Status Former Smoker  . Packs/day: 0.50  . Years: 35.00  . Pack years: 17.50  . Types: Cigarettes  . Last attempt to quit: 02/12/2013  . Years since quitting: 4.7  Smokeless Tobacco Never Used    Labs: Recent Review Flowsheet Data    Labs for ITP Cardiac and Pulmonary Rehab Latest Ref Rng & Units 08/25/2017 08/25/2017 08/26/2017 08/26/2017 08/29/2017   Cholestrol 0 - 200 mg/dL - - - - 88   LDLCALC 0 - 99 mg/dL - - - - 41   HDL >40 mg/dL - - - - 23(L)   Trlycerides <150 mg/dL - - - - 118   Hemoglobin A1c  4.8 - 5.6 % - - - - -   PHART 7.350 - 7.450 7.387 - - 7.421 -   PCO2ART 32.0 - 48.0 mmHg 37.2 - - 39.7 -   HCO3 20.0 - 28.0 mmol/L 22.4 - - 25.8 -   TCO2 22 - 32 mmol/L 24 24 - 27 -   ACIDBASEDEF 0.0 - 2.0 mmol/L 2.0 - - - -   O2SAT % 90.0 - 69.2 99.0 -      Capillary Blood Glucose: Lab Results  Component Value Date   GLUCAP 142 (H) 11/20/2017   GLUCAP 180 (H) 11/19/2017   GLUCAP 172 (H) 11/19/2017   GLUCAP 127 (H) 11/19/2017   GLUCAP 127 (H) 11/19/2017     Exercise Target Goals: Exercise Program Goal: Individual exercise prescription set using results from initial 6 min walk test and THRR while considering  patient's activity barriers and safety.   Exercise Prescription Goal: Initial exercise prescription builds to 30-45 minutes a day of aerobic activity, 2-3 days per week.  Home exercise guidelines will be given to patient during program as part of exercise prescription that the participant will acknowledge.  Activity Barriers & Risk Stratification: Activity Barriers & Cardiac Risk Stratification - 11/10/17 1032      Activity Barriers & Cardiac Risk Stratification   Activity Barriers  Incisional Pain;Back Problems;Other (comment)    Comments  PAD: Claudication Pain     Cardiac Risk Stratification  High       6 Minute Walk: 6 Minute Walk    Row Name 11/10/17 1028         6 Minute Walk   Distance  1200 feet     Walk Time  5.25 minutes     # of Rest Breaks  1     MPH  2.27     METS  2.88     RPE  13     Perceived Dyspnea   0     VO2 Peak  10.1     Symptoms  Yes (comment)     Comments  9/10 Claudication Pain (right leg). Stopped test at 5:25     Resting HR  75 bpm     Resting BP  114/70     Resting Oxygen Saturation   97 %     Exercise Oxygen Saturation  during 6 min walk  98 %     Max Ex. HR  114 bpm     Max Ex. BP  138/80     2  Minute Post BP  112/70        Oxygen Initial Assessment:   Oxygen Re-Evaluation:   Oxygen Discharge (Final Oxygen  Re-Evaluation):   Initial Exercise Prescription: Initial Exercise Prescription - 11/10/17 1000      Date of Initial Exercise RX and Referring Provider   Date  11/10/17    Referring Provider  Paul Casino MD     Expected Discharge Date  02/15/18      Recumbant Bike   Level  1.5    Watts  30    Minutes  10    METs  2.86      NuStep   Level  2    SPM  75    Minutes  10    METs  2.5      Track   Laps  8    Minutes  10    METs  2.38      Prescription Details   Frequency (times per week)  3x    Duration  Progress to 30 minutes of continuous aerobic without signs/symptoms of physical distress      Intensity   THRR 40-80% of Max Heartrate  65-130    Ratings of Perceived Exertion  11-13    Perceived Dyspnea  0-4      Progression   Progression  Continue progressive overload as per policy without signs/symptoms or physical distress.      Resistance Training   Training Prescription  Yes    Weight  3lbs    Reps  10-15       Perform Capillary Blood Glucose checks as needed.  Exercise Prescription Changes:   Exercise Comments:   Exercise Goals and Review: Exercise Goals    Row Name 11/10/17 1032             Exercise Goals   Increase Physical Activity  Yes       Intervention  Provide advice, education, support and counseling about physical activity/exercise needs.;Develop an individualized exercise prescription for aerobic and resistive training based on initial evaluation findings, risk stratification, comorbidities and participant's personal goals.       Expected Outcomes  Short Term: Attend rehab on a regular basis to increase amount of physical activity.;Long Term: Add in home exercise to make exercise part of routine and to increase amount of physical activity.;Long Term: Exercising regularly at least 3-5 days a week.       Increase Strength and Stamina  Yes       Intervention  Develop an individualized exercise prescription for aerobic and resistive  training based on initial evaluation findings, risk stratification, comorbidities and participant's personal goals.;Provide advice, education, support and counseling about physical activity/exercise needs.       Expected Outcomes  Short Term: Increase workloads from initial exercise prescription for resistance, speed, and METs.;Short Term: Perform resistance training exercises routinely during rehab and add in resistance training at home;Long Term: Improve cardiorespiratory fitness, muscular endurance and strength as measured by increased METs and functional capacity (6MWT)       Able to understand and use rate of perceived exertion (RPE) scale  Yes       Intervention  Provide education and explanation on how to use RPE scale       Expected Outcomes  Short Term: Able to use RPE daily in rehab to express subjective intensity level;Long Term:  Able to use RPE to guide intensity level when exercising independently       Knowledge and understanding  of Target Heart Rate Range (THRR)  Yes       Intervention  Provide education and explanation of THRR including how the numbers were predicted and where they are located for reference       Expected Outcomes  Short Term: Able to state/look up THRR;Long Term: Able to use THRR to govern intensity when exercising independently;Short Term: Able to use daily as guideline for intensity in rehab       Able to check pulse independently  Yes       Intervention  Provide education and demonstration on how to check pulse in carotid and radial arteries.;Review the importance of being able to check your own pulse for safety during independent exercise       Expected Outcomes  Short Term: Able to explain why pulse checking is important during independent exercise;Long Term: Able to check pulse independently and accurately       Understanding of Exercise Prescription  Yes       Intervention  Provide education, explanation, and written materials on patient's individual exercise  prescription       Expected Outcomes  Short Term: Able to explain program exercise prescription;Long Term: Able to explain home exercise prescription to exercise independently          Exercise Goals Re-Evaluation :   Discharge Exercise Prescription (Final Exercise Prescription Changes):   Nutrition:  Target Goals: Understanding of nutrition guidelines, daily intake of sodium <1537m, cholesterol <2070m calories 30% from fat and 7% or less from saturated fats, daily to have 5 or more servings of fruits and vegetables.  Biometrics: Pre Biometrics - 11/10/17 1031      Pre Biometrics   Height  5' 6.25" (1.683 m)    Weight  246 lb 14.6 oz (112 kg)    Waist Circumference  46 inches    Hip Circumference  49 inches    Waist to Hip Ratio  0.94 %    BMI (Calculated)  39.54    Triceps Skinfold  32 mm    % Body Fat  38 %    Grip Strength  38 kg    Flexibility  0 in    Single Leg Stand  18.31 seconds        Nutrition Therapy Plan and Nutrition Goals: Nutrition Therapy & Goals - 11/10/17 0858      Nutrition Therapy   Diet  heart healthy, carb modified      Personal Nutrition Goals   Nutrition Goal  Pt to identify and limit food sources of saturated fat, trans fat, refined carbohydrates and sodium    Personal Goal #2  Pt to identify food quantities necessary to achieve weight loss of 6-24 lbs. at graduation from cardiac rehab    Personal Goal #3  Improved blood glucose control as evidenced by pt's A1c trending from 7.4 toward less than 7.0.    Personal Goal #4  Pt able to name foods that affect blood glucose.      Intervention Plan   Intervention  Prescribe, educate and counsel regarding individualized specific dietary modifications aiming towards targeted core components such as weight, hypertension, lipid management, diabetes, heart failure and other comorbidities.    Expected Outcomes  Short Term Goal: Understand basic principles of dietary content, such as calories, fat, sodium,  cholesterol and nutrients.;Long Term Goal: Adherence to prescribed nutrition plan.       Nutrition Assessments: Nutrition Assessments - 11/10/17 0859      MEDFICTS Scores   Pre Score  45       Nutrition Goals Re-Evaluation:   Nutrition Goals Re-Evaluation:   Nutrition Goals Discharge (Final Nutrition Goals Re-Evaluation):   Psychosocial: Target Goals: Acknowledge presence or absence of significant depression and/or stress, maximize coping skills, provide positive support system. Participant is able to verbalize types and ability to use techniques and skills needed for reducing stress and depression.  Initial Review & Psychosocial Screening: Initial Psych Review & Screening - 11/10/17 0914      Initial Review   Current issues with  None Identified      Family Dynamics   Good Support System?  Yes   spouse, children       Quality of Life Scores: Quality of Life - 11/10/17 0915      Quality of Life   Select  Quality of Life      Quality of Life Scores   Health/Function Pre  17.6 %    Socioeconomic Pre  23.3 %    Psych/Spiritual Pre  22.1 %    Family Pre  24 %    GLOBAL Pre  20.7 %      Scores of 19 and below usually indicate a poorer quality of life in these areas.  A difference of  2-3 points is a clinically meaningful difference.  A difference of 2-3 points in the total score of the Quality of Life Index has been associated with significant improvement in overall quality of life, self-image, physical symptoms, and general health in studies assessing change in quality of life.  PHQ-9: Recent Review Flowsheet Data    Depression screen [facility] 2/9 10/08/2017   Decreased Interest 0   Down, Depressed, Hopeless 0   PHQ - 2 Score 0     Interpretation of Total Score  Total Score Depression Severity:  1-4 = Minimal depression, 5-9 = Mild depression, 10-14 = Moderate depression, 15-19 = Moderately severe depression, 20-27 = Severe depression   Psychosocial Evaluation  and Intervention:   Psychosocial Re-Evaluation:   Psychosocial Discharge (Final Psychosocial Re-Evaluation):   Vocational Rehabilitation: Provide vocational rehab assistance to qualifying candidates.   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 Participant will state understanding/return demonstration of topics presented.  Learning Barriers/Preferences: Learning Barriers/Preferences - 11/10/17 1045      Learning Barriers/Preferences   Learning Barriers  Sight    Learning Preferences  Verbal Instruction;Skilled Demonstration;Written Material       Education Topics: Count Your Pulse:  -Group instruction provided by verbal instruction, demonstration, patient participation and written materials to support subject.  Instructors address importance of being able to find your pulse and how to count your pulse when at home without a heart monitor.  Patients get hands on experience counting their pulse with staff help and individually.   Heart Attack, Angina, and Risk Factor Modification:  -Group instruction provided by verbal instruction, video, and written materials to support subject.  Instructors address signs and symptoms of angina and heart attacks.    Also discuss risk factors for heart disease and how to make changes to improve heart health risk factors.   Functional Fitness:  -Group instruction provided by verbal instruction, demonstration, patient participation, and written materials to support subject.  Instructors address safety  measures for doing things around the house.  Discuss how to get up and down off the floor, how to pick things up properly, how to safely get out of a chair without  assistance, and balance training.   Meditation and Mindfulness:  -Group instruction provided by verbal instruction, patient participation, and written materials to support subject.  Instructor addresses importance of mindfulness and meditation practice to help reduce stress and improve awareness.  Instructor also leads participants through a meditation exercise.    Stretching for Flexibility and Mobility:  -Group instruction provided by verbal instruction, patient participation, and written materials to support subject.  Instructors lead participants through series of stretches that are designed to increase flexibility thus improving mobility.  These stretches are additional exercise for major muscle groups that are typically performed during regular warm up and cool down.   Hands Only CPR:  -Group verbal, video, and participation provides a basic overview of AHA guidelines for community CPR. Role-play of emergencies allow participants the opportunity to practice calling for help and chest compression technique with discussion of AED use.   Hypertension: -Group verbal and written instruction that provides a basic overview of hypertension including the most recent diagnostic guidelines, risk factor reduction with self-care instructions and medication management.    Nutrition I class: Heart Healthy Eating:  -Group instruction provided by PowerPoint slides, verbal discussion, and written materials to support subject matter. The instructor gives an explanation and review of the Therapeutic Lifestyle Changes diet recommendations, which includes a discussion on lipid goals, dietary fat, sodium, fiber, plant stanol/sterol esters, sugar, and the components of a well-balanced, healthy diet.   Nutrition II class: Lifestyle Skills:  -Group instruction provided by PowerPoint slides, verbal discussion, and written materials to support subject matter. The instructor gives an explanation and review of  label reading, grocery shopping for heart health, heart healthy recipe modifications, and ways to make healthier choices when eating out.   Diabetes Question & Answer:  -Group instruction provided by PowerPoint slides, verbal discussion, and written materials to support subject matter. The instructor gives an explanation and review of diabetes co-morbidities, pre- and post-prandial blood glucose goals, pre-exercise blood glucose goals, signs, symptoms, and treatment of hypoglycemia and hyperglycemia, and foot care basics.   Diabetes Blitz:  -Group instruction provided by PowerPoint slides, verbal discussion, and written materials to support subject matter. The instructor gives an explanation and review of the physiology behind type 1 and type 2 diabetes, diabetes medications and rational behind using different medications, pre- and post-prandial blood glucose recommendations and Hemoglobin A1c goals, diabetes diet, and exercise including blood glucose guidelines for exercising safely.    Portion Distortion:  -Group instruction provided by PowerPoint slides, verbal discussion, written materials, and food models to support subject matter. The instructor gives an explanation of serving size versus portion size, changes in portions sizes over the last 20 years, and what consists of a serving from each food group.   Stress Management:  -Group instruction provided by verbal instruction, video, and written materials to support subject matter.  Instructors review role of stress in heart disease and how to cope with stress positively.     Exercising on Your Own:  -Group instruction provided by verbal instruction, power point, and written materials to support subject.  Instructors discuss benefits of exercise, components of exercise, frequency and intensity of exercise, and end points for exercise.  Also discuss use of nitroglycerin and activating EMS.  Review options of places to exercise outside of  rehab.  Review guidelines for sex with heart disease.   Cardiac Drugs I:  -Group instruction provided by verbal instruction and written materials to support subject.  Instructor reviews cardiac drug classes: antiplatelets, anticoagulants, beta blockers, and statins.  Instructor discusses reasons, side effects, and lifestyle considerations for each drug class.   Cardiac Drugs II:  -Group instruction provided by verbal instruction and written materials to support subject.  Instructor reviews cardiac drug classes: angiotensin converting enzyme inhibitors (ACE-I), angiotensin II receptor blockers (ARBs), nitrates, and calcium channel blockers.  Instructor discusses reasons, side effects, and lifestyle considerations for each drug class.   Anatomy and Physiology of the Circulatory System:  Group verbal and written instruction and models provide basic cardiac anatomy and physiology, with the coronary electrical and arterial systems. Review of: AMI, Angina, Valve disease, Heart Failure, Peripheral Artery Disease, Cardiac Arrhythmia, Pacemakers, and the ICD.   Other Education:  -Group or individual verbal, written, or video instructions that support the educational goals of the cardiac rehab program.   Holiday Eating Survival Tips:  -Group instruction provided by PowerPoint slides, verbal discussion, and written materials to support subject matter. The instructor gives patients tips, tricks, and techniques to help them not only survive but enjoy the holidays despite the onslaught of food that accompanies the holidays.   Knowledge Questionnaire Score: Knowledge Questionnaire Score - 11/10/17 0914      Knowledge Questionnaire Score   Pre Score  23/24       Core Components/Risk Factors/Patient Goals at Admission: Personal Goals and Risk Factors at Admission - 11/10/17 1045      Core Components/Risk Factors/Patient Goals on Admission    Weight Management  Yes;Obesity;Weight Loss     Intervention  Weight Management: Develop a combined nutrition and exercise program designed to reach desired caloric intake, while maintaining appropriate intake of nutrient and fiber, sodium and fats, and appropriate energy expenditure required for the weight goal.;Weight Management: Provide education and appropriate resources to help participant work on and attain dietary goals.;Weight Management/Obesity: Establish reasonable short term and long term weight goals.;Obesity: Provide education and appropriate resources to help participant work on and attain dietary goals.    Admit Weight  246 lb 14.6 oz (112 kg)    Goal Weight: Long Term  236 lb (107 kg)    Expected Outcomes  Short Term: Continue to assess and modify interventions until short term weight is achieved;Long Term: Adherence to nutrition and physical activity/exercise program aimed toward attainment of established weight goal;Weight Loss: Understanding of general recommendations for a balanced deficit meal plan, which promotes 1-2 lb weight loss per week and includes a negative energy balance of [phone] kcal/d;Understanding recommendations for meals to include 15-35% energy as protein, 25-35% energy from fat, 35-60% energy from carbohydrates, less than 26m of dietary cholesterol, 20-35 gm of total fiber daily;Understanding of distribution of calorie intake throughout the day with the consumption of 4-5 meals/snacks    Diabetes  Yes    Intervention  Provide education about signs/symptoms and action to take for hypo/hyperglycemia.;Provide education about proper nutrition, including hydration, and aerobic/resistive exercise prescription along with prescribed medications to achieve blood glucose in normal ranges: Fasting glucose 65-99 mg/dL    Expected Outcomes  Short Term: Participant verbalizes understanding of the signs/symptoms and immediate care of hyper/hypoglycemia, proper foot care and importance of medication, aerobic/resistive exercise and  nutrition plan for blood glucose control.;Long Term: Attainment of HbA1C < 7%.    Hypertension  Yes    Intervention  Provide education on lifestyle  modifcations including regular physical activity/exercise, weight management, moderate sodium restriction and increased consumption of fresh fruit, vegetables, and low fat dairy, alcohol moderation, and smoking cessation.;Monitor prescription use compliance.    Expected Outcomes  Short Term: Continued assessment and intervention until BP is < 140/69m HG in hypertensive participants. < 130/8110mHG in hypertensive participants with diabetes, heart failure or chronic kidney disease.;Long Term: Maintenance of blood pressure at goal levels.    Lipids  Yes    Intervention  Provide education and support for participant on nutrition & aerobic/resistive exercise along with prescribed medications to achieve LDL <7053mHDL >84m57m  Expected Outcomes  Short Term: Participant states understanding of desired cholesterol values and is compliant with medications prescribed. Participant is following exercise prescription and nutrition guidelines.;Long Term: Cholesterol controlled with medications as prescribed, with individualized exercise RX and with personalized nutrition plan. Value goals: LDL < 70mg71mL > 40 mg.       Core Components/Risk Factors/Patient Goals Review:    Core Components/Risk Factors/Patient Goals at Discharge (Final Review):    ITP Comments: ITP Comments    Row Name 11/10/17 0809 11/26/17 1119         ITP Comments  Dr. TraciFransico Himical Director   30 Day ITP Review. Mamadou Connorcheduled to start exercise on 11/30/17 pending EP clearance         Comments: See ITP comments.MariaBarnet PallBSN 11/26/2017 11:24 AM

## 2017-11-27 ENCOUNTER — Encounter (HOSPITAL_COMMUNITY): Payer: BLUE CROSS/BLUE SHIELD

## 2017-11-27 ENCOUNTER — Telehealth (HOSPITAL_COMMUNITY): Payer: Self-pay | Admitting: *Deleted

## 2017-11-27 NOTE — Telephone Encounter (Signed)
-----   Message from Thompson Grayer, MD sent at 11/26/2017 10:35 PM EST ----- Regarding: RE: cardiac rehab Yes. Ok to resume regular activity 1 week post ablation.  ----- Message ----- From: Magda Kiel, RN Sent: 11/25/2017   9:32 AM EST To: Thompson Grayer, MD Subject: cardiac rehab                                  Good morning Dr Rayann Heman,  Checking to see if you are okay with Mr Drudge beginning exercise on 11/30/17? Mr Zhen is post ablation on 11/20/17    Thanks for your input!  Sincerely,  Barnet Pall RN

## 2017-11-30 ENCOUNTER — Encounter (HOSPITAL_COMMUNITY)
Admission: RE | Admit: 2017-11-30 | Discharge: 2017-11-30 | Disposition: A | Discharge status: Home / Self Care | Payer: BLUE CROSS/BLUE SHIELD | Source: Ambulatory Visit | Attending: Internal Medicine | Admitting: Internal Medicine

## 2017-11-30 ENCOUNTER — Encounter (HOSPITAL_COMMUNITY): Payer: BLUE CROSS/BLUE SHIELD

## 2017-11-30 DIAGNOSIS — Z951 Presence of aortocoronary bypass graft: Secondary | ICD-10-CM

## 2017-11-30 LAB — GLUCOSE, CAPILLARY
GLUCOSE-CAPILLARY: 108 mg/dL — AB (ref 70–99)
Glucose-Capillary: 105 mg/dL — ABNORMAL HIGH (ref 70–99)
Glucose-Capillary: 98 mg/dL (ref 70–99)

## 2017-11-30 NOTE — Progress Notes (Signed)
Daily Session Note  Patient Details  Name: Paul Todd MRN: [id number] Date of Birth: 16-Feb-1960 Referring Provider:     CARDIAC REHAB PHASE II ORIENTATION from 11/10/2017 in [place]  Referring Provider  Pixie Casino MD       Encounter Date: 11/30/2017  Check In:   Capillary Blood Glucose: No results found for this or any previous visit (from the past 24 hour(s)).    Social History   Tobacco Use  Smoking Status Former Smoker  . Packs/day: 0.50  . Years: 35.00  . Pack years: 17.50  . Types: Cigarettes  . Last attempt to quit: 02/12/2013  . Years since quitting: 4.8  Smokeless Tobacco Never Used    Goals Met:  Exercise tolerated well  Goals Unmet:  Not Applicable  Comments: Pt started cardiac rehab today.  Pt tolerated light exercise without difficulty. VSS, telemetry-Sinus Rhythm, asymptomatic.  Medication list reconciled. Pt denies barriers to medicaiton compliance.  PSYCHOSOCIAL ASSESSMENT:  PHQ-0. Pt exhibits positive coping skills, hopeful outlook with supportive family. No psychosocial needs identified at this time, no psychosocial interventions necessary.    Pt enjoys working on cars.   Pt oriented to exercise equipment and routine.    Understanding verbalized.Barnet Pall, RN,BSN 12/01/2017 12:06 PM   Dr. Fransico Him is Medical Director for Cardiac Rehab at [facility].

## 2017-12-01 ENCOUNTER — Other Ambulatory Visit: Payer: Self-pay | Admitting: Internal Medicine

## 2017-12-01 DIAGNOSIS — I480 Paroxysmal atrial fibrillation: Secondary | ICD-10-CM

## 2017-12-02 ENCOUNTER — Encounter (HOSPITAL_COMMUNITY): Payer: BLUE CROSS/BLUE SHIELD

## 2017-12-04 ENCOUNTER — Encounter (HOSPITAL_COMMUNITY): Payer: BLUE CROSS/BLUE SHIELD

## 2017-12-07 ENCOUNTER — Encounter (HOSPITAL_COMMUNITY): Payer: BLUE CROSS/BLUE SHIELD

## 2017-12-07 ENCOUNTER — Encounter (HOSPITAL_COMMUNITY)
Admission: RE | Admit: 2017-12-07 | Discharge: 2017-12-07 | Disposition: A | Discharge status: Home / Self Care | Payer: BLUE CROSS/BLUE SHIELD | Source: Ambulatory Visit | Attending: Internal Medicine | Admitting: Internal Medicine

## 2017-12-07 DIAGNOSIS — Z951 Presence of aortocoronary bypass graft: Secondary | ICD-10-CM | POA: Diagnosis not present

## 2017-12-07 NOTE — Progress Notes (Addendum)
Paul Todd 57 y.o. male Nutrition Note Spoke with pt. Nutrition plan and goals reviewed with pt. Pt is following heart healthy diet. Pt wants to lose wt. Pt has been trying to lose wt by eating more fruit over processed foods and baked goods, cutting back on starches, and by being more physically active. Pt would like to lose about 30 more lbs. Continues to have a realistic expectation about timeframe of weight loss. Since pt travels frequently for work discussed how to navigate eating out and continue to eat heart healthy while traveling. Additionally discussed heart healthy shelf stable products to bring with him in case time is short and he is unable to grab a meal. Reviewed label reading, how to build a healthy plate, portion sizes, eating frequently across the day, complex vs refined carbs. Pt has Type 2 Diabetes. Last A1c indicates blood glucose elevated at 7.4. Pt checks CBG's 3 times a day. Fasting CBG's reportedly 130-150's mg/dL. Per discussion, pt does not use canned/convenience foods often. Pt does not add salt to food. Pt does not eat out frequently. Pt expressed understanding of the information reviewed. Pt aware of nutrition education classes offered and would like to attend nutrition classes.  Lab Results  Component Value Date   HGBA1C 7.4 (H) 08/22/2017    Wt Readings from Last 3 Encounters:  11/20/17 242 lb 8.1 oz (110 kg)  11/10/17 246 lb 14.6 oz (112 kg)  11/02/17 244 lb (110.7 kg)    Nutrition Diagnosis ? Food-and nutrition-related knowledge deficit related to lack of exposure to information as related to diagnosis of: ? CVD ? Type 2 Diabetes  Obese  II = 35-39.9 related to excessive energy intake as evidenced by a BMI 38.21   Nutrition Intervention ? Pt's individual nutrition plan reviewed with pt. ? Benefits of adopting Heart Healthy diet discussed when Medficts reviewed.    Goal(s)  Pt to identify and limit food sources of saturated fat, trans fat, refined  carbohydrates and sodium  Pt to identify food quantities necessary to achieve weight loss of 6-24 lbs. at graduation from cardiac rehab. Goal wt loss of ~30 lb desired.   Improved blood glucose control as evidenced by pt's A1c trending from 7.4 toward less than 7.0.  Pt able to name foods that affect blood glucose.  Plan:   Pt to attend nutrition classes ? Nutrition I ? Nutrition II ? Portion Distortion   Will provide client-centered nutrition education as part of interdisciplinary care  Monitor and evaluate progress toward nutrition goal with team.    Laurina Bustle, MS, RD, LDN 12/07/2017 10:29 AM

## 2017-12-09 ENCOUNTER — Encounter (HOSPITAL_COMMUNITY)
Admission: RE | Admit: 2017-12-09 | Discharge: 2017-12-09 | Disposition: A | Discharge status: Home / Self Care | Payer: BLUE CROSS/BLUE SHIELD | Source: Ambulatory Visit | Attending: Internal Medicine | Admitting: Internal Medicine

## 2017-12-09 ENCOUNTER — Other Ambulatory Visit: Payer: Self-pay

## 2017-12-09 ENCOUNTER — Encounter (HOSPITAL_COMMUNITY): Payer: BLUE CROSS/BLUE SHIELD

## 2017-12-09 ENCOUNTER — Observation Stay (HOSPITAL_COMMUNITY)
Admission: EM | Admit: 2017-12-09 | Discharge: 2017-12-11 | Disposition: A | Discharge status: Home / Self Care | Payer: BLUE CROSS/BLUE SHIELD | Attending: Internal Medicine | Admitting: Internal Medicine

## 2017-12-09 ENCOUNTER — Encounter (HOSPITAL_COMMUNITY): Payer: Self-pay | Admitting: Emergency Medicine

## 2017-12-09 ENCOUNTER — Emergency Department (HOSPITAL_COMMUNITY): Payer: BLUE CROSS/BLUE SHIELD

## 2017-12-09 DIAGNOSIS — Z8249 Family history of ischemic heart disease and other diseases of the circulatory system: Secondary | ICD-10-CM | POA: Diagnosis not present

## 2017-12-09 DIAGNOSIS — I10 Essential (primary) hypertension: Secondary | ICD-10-CM | POA: Diagnosis not present

## 2017-12-09 DIAGNOSIS — Z6836 Body mass index (BMI) 36.0-36.9, adult: Secondary | ICD-10-CM | POA: Insufficient documentation

## 2017-12-09 DIAGNOSIS — R079 Chest pain, unspecified: Secondary | ICD-10-CM | POA: Diagnosis not present

## 2017-12-09 DIAGNOSIS — Z79899 Other long term (current) drug therapy: Secondary | ICD-10-CM | POA: Insufficient documentation

## 2017-12-09 DIAGNOSIS — E785 Hyperlipidemia, unspecified: Secondary | ICD-10-CM | POA: Diagnosis present

## 2017-12-09 DIAGNOSIS — Z7901 Long term (current) use of anticoagulants: Secondary | ICD-10-CM | POA: Insufficient documentation

## 2017-12-09 DIAGNOSIS — E119 Type 2 diabetes mellitus without complications: Secondary | ICD-10-CM | POA: Diagnosis not present

## 2017-12-09 DIAGNOSIS — Z86718 Personal history of other venous thrombosis and embolism: Secondary | ICD-10-CM

## 2017-12-09 DIAGNOSIS — Z823 Family history of stroke: Secondary | ICD-10-CM | POA: Diagnosis not present

## 2017-12-09 DIAGNOSIS — I480 Paroxysmal atrial fibrillation: Secondary | ICD-10-CM | POA: Insufficient documentation

## 2017-12-09 DIAGNOSIS — I2510 Atherosclerotic heart disease of native coronary artery without angina pectoris: Secondary | ICD-10-CM

## 2017-12-09 DIAGNOSIS — G709 Myoneural disorder, unspecified: Secondary | ICD-10-CM | POA: Diagnosis not present

## 2017-12-09 DIAGNOSIS — M1990 Unspecified osteoarthritis, unspecified site: Secondary | ICD-10-CM | POA: Diagnosis not present

## 2017-12-09 DIAGNOSIS — G510 Bell's palsy: Secondary | ICD-10-CM | POA: Insufficient documentation

## 2017-12-09 DIAGNOSIS — Z955 Presence of coronary angioplasty implant and graft: Secondary | ICD-10-CM | POA: Diagnosis not present

## 2017-12-09 DIAGNOSIS — Z833 Family history of diabetes mellitus: Secondary | ICD-10-CM | POA: Insufficient documentation

## 2017-12-09 DIAGNOSIS — Z23 Encounter for immunization: Secondary | ICD-10-CM | POA: Insufficient documentation

## 2017-12-09 DIAGNOSIS — Z951 Presence of aortocoronary bypass graft: Secondary | ICD-10-CM | POA: Diagnosis not present

## 2017-12-09 DIAGNOSIS — Z9861 Coronary angioplasty status: Secondary | ICD-10-CM

## 2017-12-09 DIAGNOSIS — G4733 Obstructive sleep apnea (adult) (pediatric): Secondary | ICD-10-CM | POA: Diagnosis not present

## 2017-12-09 DIAGNOSIS — Z86711 Personal history of pulmonary embolism: Secondary | ICD-10-CM | POA: Diagnosis not present

## 2017-12-09 DIAGNOSIS — I200 Unstable angina: Secondary | ICD-10-CM | POA: Diagnosis present

## 2017-12-09 DIAGNOSIS — I252 Old myocardial infarction: Secondary | ICD-10-CM | POA: Diagnosis not present

## 2017-12-09 DIAGNOSIS — I25119 Atherosclerotic heart disease of native coronary artery with unspecified angina pectoris: Secondary | ICD-10-CM | POA: Diagnosis not present

## 2017-12-09 DIAGNOSIS — E669 Obesity, unspecified: Secondary | ICD-10-CM | POA: Insufficient documentation

## 2017-12-09 DIAGNOSIS — Z87891 Personal history of nicotine dependence: Secondary | ICD-10-CM | POA: Insufficient documentation

## 2017-12-09 DIAGNOSIS — Z7982 Long term (current) use of aspirin: Secondary | ICD-10-CM | POA: Insufficient documentation

## 2017-12-09 DIAGNOSIS — I4819 Other persistent atrial fibrillation: Secondary | ICD-10-CM | POA: Diagnosis present

## 2017-12-09 DIAGNOSIS — Z794 Long term (current) use of insulin: Secondary | ICD-10-CM | POA: Insufficient documentation

## 2017-12-09 LAB — TROPONIN I: Troponin I: <0.03 ng/mL (ref <0.03)

## 2017-12-09 LAB — GLUCOSE, CAPILLARY: Glucose-Capillary: 139 mg/dL — ABNORMAL HIGH (ref 70–99)

## 2017-12-09 LAB — BASIC METABOLIC PANEL
Anion gap: 12 (ref 5–15)
BUN: 15 mg/dL (ref 6–20)
CHLORIDE: 107 mmol/L (ref 98–111)
CO2: 20 mmol/L — ABNORMAL LOW (ref 22–32)
CREATININE: 0.94 mg/dL (ref 0.61–1.24)
Calcium: 9.2 mg/dL (ref 8.9–10.3)
GFR calc Af Amer: >60 mL/min (ref >60)
GFR calc non Af Amer: >60 mL/min (ref >60)
GLUCOSE: 128 mg/dL — AB (ref 70–99)
POTASSIUM: 4.1 mmol/L (ref 3.5–5.1)
Sodium: 139 mmol/L (ref 135–145)

## 2017-12-09 LAB — CBC
HEMATOCRIT: 41.6 % (ref 39.0–52.0)
HEMOGLOBIN: 11.4 g/dL — AB (ref 13.0–17.0)
MCH: 22.2 pg — AB (ref 26.0–34.0)
MCHC: 27.4 g/dL — AB (ref 30.0–36.0)
MCV: 81.1 fL (ref 80.0–100.0)
Platelets: 362 10*3/uL (ref 150–400)
RBC: 5.13 MIL/uL (ref 4.22–5.81)
RDW: 15.9 % — ABNORMAL HIGH (ref 11.5–15.5)
WBC: 8.2 10*3/uL (ref 4.0–10.5)
nRBC: 0 % (ref 0.0–0.2)

## 2017-12-09 LAB — I-STAT TROPONIN, ED: Troponin i, poc: 0 ng/mL (ref 0.00–0.08)

## 2017-12-09 LAB — CK TOTAL AND CKMB (NOT AT ARMC)
CK, MB: 1.6 ng/mL (ref 0.5–5.0)
RELATIVE INDEX: INVALID (ref 0.0–2.5)
Total CK: 85 U/L (ref 49–397)

## 2017-12-09 MED ORDER — INSULIN ASPART 100 UNIT/ML ~~LOC~~ SOLN: 0.0000 [IU] | Freq: Three times a day (TID) | SUBCUTANEOUS | Status: DC

## 2017-12-09 MED ORDER — PANTOPRAZOLE SODIUM 40 MG PO TBEC
40.0000 mg | DELAYED_RELEASE_TABLET | Freq: Every day | ORAL | Status: DC
  Administered 2017-12-10 – 2017-12-11 (×2): 40 mg via ORAL
  Filled 2017-12-09 (×2): qty 1

## 2017-12-09 MED ORDER — AMIODARONE HCL 200 MG PO TABS
200.0000 mg | ORAL_TABLET | Freq: Every day | ORAL | Status: DC
  Administered 2017-12-10 – 2017-12-11 (×2): 200 mg via ORAL
  Filled 2017-12-09 (×2): qty 1

## 2017-12-09 MED ORDER — ONDANSETRON HCL 4 MG/2ML IJ SOLN: 4.0000 mg | Freq: Four times a day (QID) | INTRAMUSCULAR | Status: DC | PRN

## 2017-12-09 MED ORDER — MORPHINE SULFATE (PF) 2 MG/ML IV SOLN: 2.0000 mg | INTRAVENOUS | Status: DC | PRN

## 2017-12-09 MED ORDER — ATORVASTATIN CALCIUM 80 MG PO TABS
80.0000 mg | ORAL_TABLET | Freq: Every day | ORAL | Status: DC
  Administered 2017-12-10: 80 mg via ORAL
  Filled 2017-12-09: qty 1

## 2017-12-09 MED ORDER — METOPROLOL TARTRATE 25 MG PO TABS
25.0000 mg | ORAL_TABLET | Freq: Two times a day (BID) | ORAL | Status: DC
  Administered 2017-12-09 – 2017-12-11 (×4): 25 mg via ORAL
  Filled 2017-12-09 (×4): qty 1

## 2017-12-09 MED ORDER — ALPRAZOLAM 0.25 MG PO TABS: 0.2500 mg | ORAL_TABLET | Freq: Two times a day (BID) | ORAL | Status: DC | PRN

## 2017-12-09 MED ORDER — ACETAMINOPHEN 325 MG PO TABS: 650.0000 mg | ORAL_TABLET | ORAL | Status: DC | PRN

## 2017-12-09 MED ORDER — DULOXETINE HCL 60 MG PO CPEP
60.0000 mg | ORAL_CAPSULE | Freq: Every evening | ORAL | Status: DC
  Administered 2017-12-09 – 2017-12-10 (×2): 60 mg via ORAL
  Filled 2017-12-09 (×2): qty 1

## 2017-12-09 MED ORDER — HEPARIN BOLUS VIA INFUSION
4000.0000 [IU] | Freq: Once | INTRAVENOUS | Status: AC
  Administered 2017-12-09: 4000 [IU] via INTRAVENOUS
  Filled 2017-12-09: qty 4000

## 2017-12-09 MED ORDER — ALUM & MAG HYDROXIDE-SIMETH 200-200-20 MG/5ML PO SUSP
30.0000 mL | Freq: Once | ORAL | Status: AC
  Administered 2017-12-09: 30 mL via ORAL
  Filled 2017-12-09: qty 30

## 2017-12-09 MED ORDER — INSULIN ASPART 100 UNIT/ML ~~LOC~~ SOLN: 0.0000 [IU] | Freq: Every day | SUBCUTANEOUS | Status: DC

## 2017-12-09 MED ORDER — NITROGLYCERIN 0.4 MG SL SUBL: 0.4000 mg | SUBLINGUAL_TABLET | SUBLINGUAL | Status: DC | PRN

## 2017-12-09 MED ORDER — ASPIRIN EC 81 MG PO TBEC
81.0000 mg | DELAYED_RELEASE_TABLET | Freq: Every day | ORAL | Status: DC
  Administered 2017-12-10 – 2017-12-11 (×2): 81 mg via ORAL
  Filled 2017-12-09 (×2): qty 1

## 2017-12-09 MED ORDER — HEPARIN (PORCINE) 25000 UT/250ML-% IV SOLN
1400.0000 [IU]/h | INTRAVENOUS | Status: DC
  Administered 2017-12-09: 1250 [IU]/h via INTRAVENOUS
  Administered 2017-12-10: 1400 [IU]/h via INTRAVENOUS
  Filled 2017-12-09 (×2): qty 250

## 2017-12-09 NOTE — ED Triage Notes (Signed)
Pt presents to ED for assessment after having a 9/10 sharp pain shoot across his chest and last approx 10-15 minutes after taking 1 home nitro.  Pain resolved at this time, residual nausea, light-headedness and right sided jaw pain.  Hx of triple bypass and MI

## 2017-12-09 NOTE — ED Provider Notes (Signed)
[facility] EMERGENCY DEPARTMENT Provider Note   CSN: [id number] Arrival date & time: 12/09/17  1356     History   Chief Complaint Chief Complaint  Patient presents with  . Chest Pain    HPI Paul Todd is a 57 y.o. male with history of CAD status post CABG x3, DVT, PE, type 2 diabetes mellitus, atrial flutter, nephrolithiasis, HLD, HTN, NSTEMI presents for evaluation of acute onset, resolved episode of chest pain earlier today.  Patient reports that he went to cardiac rehab and left at around 11 AM.  He states that at around noon while sitting at his desk doing work he began to feel a squeezing pain to the chest generally, radiating to the right jaw.  He took one sublingual nitroglycerin with complete resolution of his pain immediately.  He did report some lightheadedness and nausea after taking the nitroglycerin but this has resolved.  He reports he is back to baseline and has not experienced any additional episodes of chest pain.  He does not take his sublingual nitroglycerin frequently.  Denies recent travel, hemoptysis, or hormonal placement therapy.  He did recently undergo electrophysiology study and radiofrequency catheter ablation on 11/19/2017 by Dr. Rayann Heman.  He is a former smoker, quit in 2015.  Denies recreational drug use or excessive alcohol intake, drinks a proximally 4 to 5 cups of coffee daily.  His cardiologist is [name].  The history is provided by the patient.    Past Medical History:  Diagnosis Date  . Arthritis       . Atrial flutter ([place])   . CAD (coronary artery disease)    multivessel per cath 02/12/13, s/p stenting to RCA  . Chronic lower back pain   . Diabetes mellitus without complication ([place])   . Dyslipidemia   . History of kidney stones   . Hx of Bell's palsy   . Hypertension   . Myocardial infarction ([place]) 02/12/2013   . Nephrolithiasis   . Neuromuscular disorder (H[facility])    Bell's Palsy  . OSA on CPAP   . PE (pulmonary  thromboembolism) ([place]) 02/2014  . Persistent atrial fibrillation         Patient Active Problem List   Diagnosis Date Noted  . Chest pain 12/09/2017  . Newly diagnosed diabetes ([place]) 10/08/2017  . Typical atrial flutter ([place])   . Type 2 diabetes mellitus without complication, without long-term current use of insulin ([place]) 08/27/2017  . History of DVT (deep vein thrombosis) 08/27/2017  . History of pulmonary embolus (PE) 08/27/2017  . 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 Coronary artery disease involving native coronary artery of native heart with unstable angina pectoris ([place])   . PAD (peripheral artery disease) ([place]) 04/07/2017  . Ischemic cardiomyopathy 11/25/2016  . Mixed hyperlipidemia 11/25/2016  . Pre-operative cardiovascular examination 08/23/2015  . PVC (premature ventricular contraction) 07/24/2014  . Encounter for cardioversion procedure   . Persistent atrial fibrillation   . Pulmonary embolus ([place]) 06/20/2014  . Back pain 02/21/2014  . Chest pain with high risk for cardiac etiology 09/05/2013  . Nephrolithiasis 09/05/2013  . Hematuria, gross 09/05/2013  . Nausea and vomiting 09/05/2013  . Nausea & vomiting   . Old myocardial infarction   . Emesis 09/02/2013  . OSA (obstructive sleep apnea) 07/22/2013  . Obesity (BMI 35.0-39.9 without comorbidity) 03/07/2013  . Unstable angina - Post Infarct Angina with existing LAD disease 02/14/2013  . CAD S/P percutaneous coronary angioplasty - DES to Mid LAD; 2x  49m x 38 mm Xience DES to RCA 02/14/2013  . Dyslipidemia 02/12/2013  . NSTEMI (non-ST elevated myocardial infarction) ([place] 01/30/2013  . Tobacco abuse 01/30/2013  . Fatigue 01/30/2013  . PAF (paroxysmal atrial fibrillation) ([place] 01/30/2013    Past Surgical History:  Procedure Laterality Date  . ABDOMINAL AORTOGRAM W/LOWER EXTREMITY N/A 01/26/2017   Procedure: ABDOMINAL AORTOGRAM W/LOWER EXTREMITY;  Surgeon: DAngelia Mould MD;  [facility];  Service:  Cardiovascular;  Laterality: N/A;  . ATRIAL FIBRILLATION ABLATION  11/19/2017  . ATRIAL FIBRILLATION ABLATION N/A 11/19/2017   Procedure: ATRIAL FIBRILLATION ABLATION;  Surgeon: AThompson Grayer MD;  [facility];  Service: Cardiovascular;  Laterality: N/A;  . BACK SURGERY  2017   laminectomy  . CARDIOVERSION N/A 02/15/2013   Procedure: CARDIOVERSION;  Surgeon: PThayer Headings MD;  [facility]  Service: Cardiovascular;  Laterality: N/A;  . CARDIOVERSION N/A 07/07/2014   Procedure: CARDIOVERSION;  Surgeon: KPixie Casino MD;  [facility];  Service: Cardiovascular;  Laterality: N/A;  . CARDIOVERSION N/A 08/31/2017   Procedure: CARDIOVERSION;  Surgeon: NDorothy Spark MD;  [facility];  Service: Cardiovascular;  Laterality: N/A;  . CARDIOVERSION N/A 09/29/2017   Procedure: CARDIOVERSION;  Surgeon: CSanda Klein MD;  [facility]  Service: Cardiovascular;  Laterality: N/A;  . CORONARY ANGIOPLASTY WITH STENT PLACEMENT  02/12/2013  . CORONARY ARTERY BYPASS GRAFT N/A 08/24/2017   Procedure: CORONARY ARTERY BYPASS GRAFTING (CABG) X3. RIGHT ENDOSCOPIC SAPHENOUS VEIN HARVEST. MAMM TO LAD, SVG TO OM, SVG TO RAMUS;  Surgeon: VIvin Poot MD;  [facility]  Service: Open Heart Surgery;  Laterality: N/A;  . CYSTOSCOPY WITH RETROGRADE PYELOGRAM, URETEROSCOPY AND STENT PLACEMENT Left 09/02/2013   Procedure: CYSTOSCOPY WITH RETROGRADE PYELOGRAM/LEFT URETEROSCOPY/URETERAL STENT, with fulguration;  Surgeon: MFestus Aloe MD;  [facility];  Service: Urology;  Laterality: Left;  . CYSTOSCOPY WITH URETEROSCOPY AND STENT PLACEMENT Left 09/16/2013   Procedure: CYSTOSCOPY WITH URETEROSCOPY AND STENT PLACEMENT;  Surgeon: MFestus Aloe MD;  [facility];  Service: Urology;  Laterality: Left;  . FEMORAL BYPASS Left 04/07/2017   below the knee popliteal  . FEMORAL-POPLITEAL BYPASS GRAFT Left 04/07/2017   Procedure: LEFT FEMORAL-BELOW KNEE  POPLITEAL ARTERY BYPASS USING COMPOSITE LEFT GREATER SAPHENOUS VEIN;  Surgeon: DAngelia Mould MD;  [place] [facility]  Service: Vascular;  Laterality: Left;  . HOLMIUM LASER APPLICATION Left 97/67/3419  Procedure: HOLMIUM LASER APPLICATION;  Surgeon: MFestus Aloe MD;  [facility];  Service: Urology;  Laterality: Left;  . INTRAVASCULAR PRESSURE WIRE/FFR STUDY N/A 08/21/2017   Procedure: INTRAVASCULAR PRESSURE WIRE/FFR STUDY;  Surgeon: CSherren Mocha MD;  [facility];  Service: Cardiovascular;  Laterality: N/A;  . LEFT HEART CATH AND CORONARY ANGIOGRAPHY N/A 08/21/2017   Procedure: LEFT HEART CATH AND CORONARY ANGIOGRAPHY;  Surgeon: CSherren Mocha MD;  [facility];  Service: Cardiovascular;  Laterality: N/A;  . LEFT HEART CATHETERIZATION WITH CORONARY ANGIOGRAM N/A 02/12/2013   Procedure: LEFT HEART CATHETERIZATION WITH CORONARY ANGIOGRAM;  Surgeon: TTroy Sine MD;  [facility];  Service: Cardiovascular;  Laterality: N/A;  . PERCUTANEOUS CORONARY STENT INTERVENTION (PCI-S)  02/12/2013   Procedure: PERCUTANEOUS CORONARY STENT INTERVENTION (PCI-S);  Surgeon: TTroy Sine MD;  [facility];  Service: Cardiovascular;;  STEMI  . PERCUTANEOUS CORONARY STENT INTERVENTION (PCI-S) N/A 02/14/2013   Procedure: PERCUTANEOUS CORONARY STENT INTERVENTION (PCI-S);  Surgeon: CBurnell Blanks MD;  [facility];  Service: Cardiovascular;  Laterality: N/A;  Proximal LAD  . TEE WITHOUT CARDIOVERSION N/A 02/15/2013   Procedure: TRANSESOPHAGEAL ECHOCARDIOGRAM (TEE);  Surgeon: Thayer Headings, MD;  Location: [facility];  Service: Cardiovascular;  Laterality: N/A;  . TEE WITHOUT CARDIOVERSION N/A 08/24/2017   Procedure: TRANSESOPHAGEAL ECHOCARDIOGRAM (TEE);  Surgeon: Prescott Gum, Collier Salina, MD;  Location: [facility];  Service: Open Heart Surgery;  Laterality: N/A;  . TEE WITHOUT CARDIOVERSION N/A 08/31/2017   Procedure: TRANSESOPHAGEAL  ECHOCARDIOGRAM (TEE);  Surgeon: Dorothy Spark, MD;  [facility];  Service: Cardiovascular;  Laterality: N/A;     OB History   None      Home Medications    Prior to Admission medications   Medication Sig Start Date End Date Taking? Authorizing Provider  ACCU-CHEK AVIVA PLUS test strip USE UP TO 4 TIMES DAILY AS DIRECTED 09/17/17   Barrett, Erin R, PA-C  ACCU-CHEK SOFTCLIX LANCETS lancets USE UP TO 4 TIMES DAILY AS DIRECTED 10/18/17   Prescott Gum, Collier Salina, MD  acetaminophen (TYLENOL) 650 MG CR tablet Take 1,300 mg by mouth every 8 (eight) hours as needed for pain.     [provider]  amiodarone (PACERONE) 200 MG tablet Take 1 tablet (200 mg total) by mouth daily. 10/19/17   Allred, Jeneen Rinks, MD  aspirin EC 81 MG tablet Take 81 mg by mouth daily.    [provider]  atorvastatin (LIPITOR) 80 MG tablet Take 80 mg by mouth daily.    [provider]  blood glucose meter kit and supplies KIT Dispense based on patient and insurance preference. Use up to four times daily as directed. (FOR ICD-9 250.00, 250.01). 09/02/17   Barrett, Erin R, PA-C  docusate sodium (COLACE) 100 MG capsule Take 100 mg by mouth daily.  01/08/17   [provider]  DULoxetine (CYMBALTA) 60 MG capsule Take 60 mg by mouth every evening.    [provider]  JARDIANCE 10 MG TABS tablet Take 10 mg by mouth daily. 08/10/17   [provider]  Magnesium 250 MG TABS Take 250 mg by mouth daily.    [provider]  magnesium oxide (MAG-OX) 400 (241.3 MG) MG tablet Take 1 tablet (400 mg total) by mouth daily. 07/25/14   Patsey Berthold, NP  metoprolol tartrate (LOPRESSOR) 25 MG tablet Take 1 tablet (25 mg total) by mouth 2 (two) times daily. 11/20/17   Baldwin Jamaica, PA-C  Multiple Vitamin (MULTIVITAMIN WITH MINERALS) TABS tablet Take 1 tablet by mouth daily.     [provider]  nitroGLYCERIN (NITROSTAT) 0.4 MG SL tablet PLACE 1 TABLET UNDER TONGUE EVERY 5  MINUTES AS NEEDED FOR CHEST PAIN MAX 3 TABLETS Patient taking differently: Place 0.4 mg under the tongue every 5 (five) minutes as needed for chest pain.  07/20/17   Hilty, Nadean Corwin, MD  NON FORMULARY at bedtime. CPAP    [provider]  pantoprazole (PROTONIX) 40 MG tablet Take 1 tablet (40 mg total) by mouth daily. 10/20/17   Pixie Casino, MD  Potassium 99 MG TABS Take 99 mg by mouth daily.     [provider]  XARELTO 20 MG TABS tablet TAKE 1 TABLET (20 MG TOTAL) DAILY WITH SUPPER BY MOUTH. 12/02/17   Thompson Grayer, MD    Family History Family History  Problem Relation Age of Onset  . Heart disease Mother   . Diabetes Mother   . Stroke Mother   . Diabetes Father  Social History Social History   Tobacco Use  . Smoking status: Former Smoker    Packs/day: 0.50    Years: 35.00    Pack years: 17.50    Types: Cigarettes    Last attempt to quit: 02/12/2013    Years since quitting: 4.8  . Smokeless tobacco: Never Used  Substance Use Topics  . Alcohol use: No    Comment: 07/21/2014 "never drank much; might have a couple drinks/yr since MI 02/2013"  . Drug use: No     Allergies   Patient has no known allergies.   Review of Systems Review of Systems  Constitutional: Negative for chills and fever.  Respiratory: Negative for shortness of breath.   Cardiovascular: Positive for chest pain (resolved).  Gastrointestinal: Positive for nausea (resolved). Negative for abdominal pain and vomiting.  Neurological: Positive for light-headedness (resolved).  All other systems reviewed and are negative.    Physical Exam Updated Vital Signs BP 137/82   Pulse 62   Temp (!) 97.1 F (36.2 C) (Oral)   Resp (!) 23   SpO2 98%   Physical Exam  Constitutional: He appears well-developed and well-nourished. No distress.  HENT:  Head: Normocephalic and atraumatic.  Eyes: Conjunctivae are normal. Right eye exhibits no discharge. Left eye exhibits no discharge.  Neck:  No JVD present. No tracheal deviation present.  Cardiovascular: Normal rate and regular rhythm.  Pulses:      Carotid pulses are 2+ on the right side, and 2+ on the left side.      Radial pulses are 2+ on the right side, and 2+ on the left side.       Dorsalis pedis pulses are 2+ on the right side, and 2+ on the left side.       Posterior tibial pulses are 2+ on the right side, and 2+ on the left side.  Trace pitting edema of the bilateral lower extreme knees, Homans sign absent bilaterally, calves measure 39 cm circumferentially bilaterally.  No palpable cords, compartments are soft.  Pulmonary/Chest: Effort normal and breath sounds normal.  Well-healed midline sternotomy scar.  Tenderness to palpation of the anterior chest wall.  Patient reports this is chronic and unchanged secondary to his surgeries and recent ablation procedure.  Equal rise and fall of chest, no increased work of breathing.  Speaking in full sentences without difficulty.  Abdominal: Soft. Bowel sounds are normal. He exhibits no distension.  Musculoskeletal: He exhibits no edema.  Neurological: He is alert.  Skin: Skin is warm and dry. No erythema.  Psychiatric: He has a normal mood and affect. His behavior is normal.  Nursing note and vitals reviewed.    ED Treatments / Results  Labs (all labs ordered are listed, but only abnormal results are displayed) Labs Reviewed  BASIC METABOLIC PANEL - Abnormal; Notable for the following components:      Result Value   CO2 20 (*)    Glucose, Bld 128 (*)    All other components within normal limits  CBC - Abnormal; Notable for the following components:   Hemoglobin 11.4 (*)    MCH 22.2 (*)    MCHC 27.4 (*)    RDW 15.9 (*)    All other components within normal limits  TROPONIN I  TROPONIN I  TROPONIN I  CK TOTAL AND CKMB (NOT AT [facility])  I-STAT TROPONIN, ED    EKG EKG Interpretation  Date/Time:  Wednesday December 09 2017 14:08:03 EST Ventricular Rate:  66 PR  Interval:  200 QRS Duration: 86 QT Interval:  406 QTC Calculation: 425 R Axis:   83 Text Interpretation:  Normal sinus rhythm Anteroseptal infarct , age undetermined Abnormal ECG No acute changes No significant change since last tracing Confirmed by Varney Biles [phone]) on 12/09/2017 4:34:16 PM   Radiology Dg Chest 2 View  Result Date: 12/09/2017 CLINICAL DATA:  Chest pain and dyspnea for 10 minutes today. EXAM: CHEST - 2 VIEW COMPARISON:  10/09/2017 CXR, chest CT 11/13/2017 FINDINGS: Status post CABG. Normal heart and mediastinal contours. Clear lungs. No effusion or pneumothorax. No acute osseous abnormality. IMPRESSION: No active cardiopulmonary disease. Electronically Signed   By: Ashley Royalty M.D.   On: 12/09/2017 15:54    Procedures Procedures (including critical care time)  Medications Ordered in ED Medications  amiodarone (PACERONE) tablet 200 mg (has no administration in time range)  aspirin EC tablet 81 mg (has no administration in time range)  atorvastatin (LIPITOR) tablet 80 mg (has no administration in time range)  DULoxetine (CYMBALTA) DR capsule 60 mg (has no administration in time range)  metoprolol tartrate (LOPRESSOR) tablet 25 mg (has no administration in time range)  nitroGLYCERIN (NITROSTAT) SL tablet 0.4 mg (has no administration in time range)  pantoprazole (PROTONIX) EC tablet 40 mg (has no administration in time range)  acetaminophen (TYLENOL) tablet 650 mg (has no administration in time range)  ondansetron (ZOFRAN) injection 4 mg (has no administration in time range)  morphine 2 MG/ML injection 2 mg (has no administration in time range)  alum & mag hydroxide-simeth (MAALOX/MYLANTA) 200-200-20 MG/5ML suspension 30 mL (has no administration in time range)  ALPRAZolam (XANAX) tablet 0.25 mg (has no administration in time range)     Initial Impression / Assessment and Plan / ED Course  I have reviewed the triage vital signs and the nursing notes.  Pertinent  labs & imaging results that were available during my care of the patient were reviewed by me and considered in my medical decision making (see chart for details).     Patient with acute onset of chest pain which was described as a squeezing sensation.  Lasted for 5 to 6 minutes and resolved after taking sublingual nitroglycerin.  Patient currently chest pain-free.  Patient is afebrile, vital signs are stable.  He is nontoxic in appearance.  Pain is not exertional, pleuritic, or reproducible on palpation.  Will obtain lab work, EKG, chest x-ray for further evaluation.  Initial troponin is negative, chest x-ray shows no acute cardiopulmonary abnormality.  Remainder of lab work reviewed by me shows mild anemia, no metabolic derangements, leukocytosis, or renal insufficiency. 4:51 PM Spoke with Dr.Acharya with cardiology who has reviewed patient's history and most recent EKG.  EKG with subtle ST segment changes in leads III and aVF.  She recommends repeat EKG and overnight observation with hospitalist admission.  Spoke with Dr. Posey Pronto with [facility] hospitalist service who agrees to assume care of patient and bring him into the hospital for further evaluation and management.  Patient agreeable to admission.  Final Clinical Impressions(s) / ED Diagnoses   Final diagnoses:  Chest pain with high risk for cardiac etiology    ED Discharge Orders    None       Renita Papa, PA-C 12/09/17 [place], Ankit, MD 12/10/17 0104

## 2017-12-09 NOTE — ED Notes (Signed)
Returned from xray

## 2017-12-09 NOTE — ED Notes (Signed)
Pt just now arrived in room.

## 2017-12-09 NOTE — H&P (Signed)
[facility] History and Physical   Patient: Paul Todd HMC:[id number]   PCP: Everardo Beals, NP DOB: June 29, 1960   DOA: 12/09/2017   DOS: 12/09/2017   DOS: the patient was seen and examined on 12/09/2017  Patient coming from: The patient is coming from home.  Chief Complaint: Chest pain  HPI: Paul Todd is a 57 y.o. male with Past medical history of CAD S/P CABG, type II DM, atrial flutter with recent ablation, history of PE. Patient presented with complaints of chest pain.  He mentions that after coming from cardiac rehab 1 hour later he started having generalized central chest pain which felt like chest tightness.  It felt similar to his prior MI in August 2019 but and milder version.  The pain did not subside in 10 minutes and therefore he took a nitroglycerin and within 5 minutes the pain subsided. He decided to come to the hospital. He felt some nausea but no vomiting.  No shortness of breath but feels tired. No headache no chest pain right now. He denies having any similar chest pain recently. No fever chills diarrhea.  No swelling in the leg.  He remains compliant with all his medication and denies any smoking right now.  ED Course: Came to the hospital with chest pain with EKG changes.  Cardiology was consulted and recommended admission for observation under hospitalist.  At his baseline ambulates without any support And is independent for most of his ADL; manages his medication on his own.  Review of Systems: as mentioned in the history of present illness.  All other systems reviewed and are negative.  Past Medical History:  Diagnosis Date  . Arthritis       . Atrial flutter ([place])   . CAD (coronary artery disease)    multivessel per cath 02/12/13, s/p stenting to RCA  . Chronic lower back pain   . Diabetes mellitus without complication ([place])   . Dyslipidemia   . History of kidney stones   . Hx of Bell's palsy   . Hypertension   . Myocardial infarction  ([place]) 02/12/2013   . Nephrolithiasis   . Neuromuscular disorder (HCC)    Bell's Palsy  . OSA on CPAP   . PE (pulmonary thromboembolism) ([place]) 02/2014  . Persistent atrial fibrillation        Past Surgical History:  Procedure Laterality Date  . ABDOMINAL AORTOGRAM W/LOWER EXTREMITY N/A 01/26/2017   Procedure: ABDOMINAL AORTOGRAM W/LOWER EXTREMITY;  Surgeon: Angelia Mould, MD;  Location: [place] [facility];  Service: Cardiovascular;  Laterality: N/A;  . ATRIAL FIBRILLATION ABLATION  11/19/2017  . ATRIAL FIBRILLATION ABLATION N/A 11/19/2017   Procedure: ATRIAL FIBRILLATION ABLATION;  Surgeon: Thompson Grayer, MD;  Location: [place] [facility];  Service: Cardiovascular;  Laterality: N/A;  . BACK SURGERY  2017   laminectomy  . CARDIOVERSION N/A 02/15/2013   Procedure: CARDIOVERSION;  Surgeon: Thayer Headings, MD;  Location: [place];  Service: Cardiovascular;  Laterality: N/A;  . CARDIOVERSION N/A 07/07/2014   Procedure: CARDIOVERSION;  Surgeon: Pixie Casino, MD;  Location: [facility] ENDOSCOPY;  Service: Cardiovascular;  Laterality: N/A;  . CARDIOVERSION N/A 08/31/2017   Procedure: CARDIOVERSION;  Surgeon: Dorothy Spark, MD;  Location: [facility] ENDOSCOPY;  Service: Cardiovascular;  Laterality: N/A;  . CARDIOVERSION N/A 09/29/2017   Procedure: CARDIOVERSION;  Surgeon: Sanda Klein, MD;  Location: [place];  Service: Cardiovascular;  Laterality: N/A;  . CORONARY ANGIOPLASTY WITH STENT PLACEMENT  02/12/2013  . CORONARY ARTERY BYPASS GRAFT  N/A 08/24/2017   Procedure: CORONARY ARTERY BYPASS GRAFTING (CABG) X3. RIGHT ENDOSCOPIC SAPHENOUS VEIN HARVEST. MAMM TO LAD, SVG TO OM, SVG TO RAMUS;  Surgeon: Ivin Poot, MD;  Location: [place];  Service: Open Heart Surgery;  Laterality: N/A;  . CYSTOSCOPY WITH RETROGRADE PYELOGRAM, URETEROSCOPY AND STENT PLACEMENT Left 09/02/2013   Procedure: CYSTOSCOPY WITH RETROGRADE PYELOGRAM/LEFT URETEROSCOPY/URETERAL STENT, with fulguration;  Surgeon: Festus Aloe, MD;  Location: [facility];  Service: Urology;  Laterality: Left;  . CYSTOSCOPY WITH URETEROSCOPY AND STENT PLACEMENT Left 09/16/2013   Procedure: CYSTOSCOPY WITH URETEROSCOPY AND STENT PLACEMENT;  Surgeon: Festus Aloe, MD;  Location: [facility];  Service: Urology;  Laterality: Left;  . FEMORAL BYPASS Left 04/07/2017   below the knee popliteal  . FEMORAL-POPLITEAL BYPASS GRAFT Left 04/07/2017   Procedure: LEFT FEMORAL-BELOW KNEE POPLITEAL ARTERY BYPASS USING COMPOSITE LEFT GREATER SAPHENOUS VEIN;  Surgeon: Angelia Mould, MD;  Location: [place];  Service: Vascular;  Laterality: Left;  . HOLMIUM LASER APPLICATION Left 6/96/2952   Procedure: HOLMIUM LASER APPLICATION;  Surgeon: Festus Aloe, MD;  Location: [facility];  Service: Urology;  Laterality: Left;  . INTRAVASCULAR PRESSURE WIRE/FFR STUDY N/A 08/21/2017   Procedure: INTRAVASCULAR PRESSURE WIRE/FFR STUDY;  Surgeon: Sherren Mocha, MD;  Location: [place] [facility];  Service: Cardiovascular;  Laterality: N/A;  . LEFT HEART CATH AND CORONARY ANGIOGRAPHY N/A 08/21/2017   Procedure: LEFT HEART CATH AND CORONARY ANGIOGRAPHY;  Surgeon: Sherren Mocha, MD;  Location: [place] [facility];  Service: Cardiovascular;  Laterality: N/A;  . LEFT HEART CATHETERIZATION WITH CORONARY ANGIOGRAM N/A 02/12/2013   Procedure: LEFT HEART CATHETERIZATION WITH CORONARY ANGIOGRAM;  Surgeon: Troy Sine, MD;  Location: [facility] CATH LAB;  Service: Cardiovascular;  Laterality: N/A;  . PERCUTANEOUS CORONARY STENT INTERVENTION (PCI-S)  02/12/2013   Procedure: PERCUTANEOUS CORONARY STENT INTERVENTION (PCI-S);  Surgeon: Troy Sine, MD;  Location: [facility] CATH LAB;  Service: Cardiovascular;;  STEMI  . PERCUTANEOUS CORONARY STENT INTERVENTION (PCI-S) N/A 02/14/2013   Procedure: PERCUTANEOUS CORONARY STENT INTERVENTION (PCI-S);  Surgeon: Burnell Blanks, MD;  Location: [facility] CATH LAB;  Service: Cardiovascular;  Laterality: N/A;  Proximal LAD  . TEE WITHOUT CARDIOVERSION N/A  02/15/2013   Procedure: TRANSESOPHAGEAL ECHOCARDIOGRAM (TEE);  Surgeon: Thayer Headings, MD;  Location: [place];  Service: Cardiovascular;  Laterality: N/A;  . TEE WITHOUT CARDIOVERSION N/A 08/24/2017   Procedure: TRANSESOPHAGEAL ECHOCARDIOGRAM (TEE);  Surgeon: Prescott Gum, Collier Salina, MD;  Location: [place];  Service: Open Heart Surgery;  Laterality: N/A;  . TEE WITHOUT CARDIOVERSION N/A 08/31/2017   Procedure: TRANSESOPHAGEAL ECHOCARDIOGRAM (TEE);  Surgeon: Dorothy Spark, MD;  Location: [facility] ENDOSCOPY;  Service: Cardiovascular;  Laterality: N/A;   Social History:  reports that he quit smoking about 4 years ago. His smoking use included cigarettes. He has a 17.50 pack-year smoking history. He has never used smokeless tobacco. He reports that he does not drink alcohol or use drugs.  No Known Allergies  Family History  Problem Relation Age of Onset  . Heart disease Mother   . Diabetes Mother   . Stroke Mother   . Diabetes Father      Prior to Admission medications   Medication Sig Start Date End Date Taking? Authorizing Provider  acetaminophen (TYLENOL) 650 MG CR tablet Take 1,300 mg by mouth every 8 (eight) hours as needed for pain.    Yes [provider]  amiodarone (PACERONE) 200 MG tablet Take 1 tablet (200 mg total) by mouth daily. 10/19/17  Yes Allred,  Jeneen Rinks, MD  aspirin EC 81 MG tablet Take 81 mg by mouth daily.   Yes [provider]  atorvastatin (LIPITOR) 80 MG tablet Take 80 mg by mouth daily.   Yes [provider]  docusate sodium (COLACE) 100 MG capsule Take 100 mg by mouth 2 (two) times daily.  01/08/17  Yes [provider]  DULoxetine (CYMBALTA) 60 MG capsule Take 60 mg by mouth every evening.   Yes [provider]  JARDIANCE 10 MG TABS tablet Take 10 mg by mouth daily. 08/10/17  Yes [provider]  magnesium oxide (MAG-OX) 400 (241.3 MG) MG tablet Take 1 tablet (400 mg total) by mouth daily. Patient taking differently: Take  250 mg by mouth daily.  07/25/14  Yes Seiler, Luetta Nutting K, NP  metoprolol tartrate (LOPRESSOR) 25 MG tablet Take 1 tablet (25 mg total) by mouth 2 (two) times daily. 11/20/17  Yes Baldwin Jamaica, PA-C  Multiple Vitamin (MULTIVITAMIN WITH MINERALS) TABS tablet Take 1 tablet by mouth daily.    Yes [provider]  nitroGLYCERIN (NITROSTAT) 0.4 MG SL tablet PLACE 1 TABLET UNDER TONGUE EVERY 5 MINUTES AS NEEDED FOR CHEST PAIN MAX 3 TABLETS Patient taking differently: Place 0.4 mg under the tongue every 5 (five) minutes as needed for chest pain.  07/20/17  Yes Hilty, Nadean Corwin, MD  NON FORMULARY at bedtime. CPAP   Yes [provider]  pantoprazole (PROTONIX) 40 MG tablet Take 1 tablet (40 mg total) by mouth daily. 10/20/17  Yes HiltyNadean Corwin, MD  Potassium 99 MG TABS Take 99 mg by mouth daily.    Yes [provider]  XARELTO 20 MG TABS tablet TAKE 1 TABLET (20 MG TOTAL) DAILY WITH SUPPER BY MOUTH. Patient taking differently: Take 20 mg by mouth daily with supper.  12/02/17  Yes Allred, Jeneen Rinks, MD  ACCU-CHEK AVIVA PLUS test strip USE UP TO 4 TIMES DAILY AS DIRECTED 09/17/17   Barrett, Erin R, PA-C  ACCU-CHEK SOFTCLIX LANCETS lancets USE UP TO 4 TIMES DAILY AS DIRECTED 10/18/17   Ivin Poot, MD  blood glucose meter kit and supplies KIT Dispense based on patient and insurance preference. Use up to four times daily as directed. (FOR ICD-9 250.00, 250.01). 09/02/17   Freddrick March, PA-C    Physical Exam: Vitals:   12/09/17 1745 12/09/17 1800 12/09/17 1805 12/09/17 1837  BP: 137/82 118/69  130/80  Pulse: 62 64  62  Resp: (!) '23 13  16  '$ Temp:    (!) 97.5 F (36.4 C)  TempSrc:    Oral  SpO2: 98% 99%  95%  Weight:   105.7 kg   Height:   '5\' 7"'$  (1.702 m)     General: Alert, Awake and Oriented to Time, Place and Person. Appear in mild distress, affect appropriate Eyes: PERRL, Conjunctiva normal ENT: Oral Mucosa clear moist. Neck: no JVD, no Abnormal Mass Or  lumps Cardiovascular: S1 and S2 Present, no Murmur, Peripheral Pulses Present Respiratory: normal respiratory effort, Bilateral Air entry equal and Decreased, no use of accessory muscle, Clear to Auscultation, no Crackles, no wheezes Abdomen: Bowel Sound present, Soft and no tenderness, no hernia Skin: no redness, no Rash, no induration Extremities: no Pedal edema, no calf tenderness Neurologic: Grossly no focal neuro deficit. Bilaterally Equal motor strength  Labs on Admission:  CBC: Recent Labs  Lab 12/09/17 1439  WBC 8.2  HGB 11.4*  HCT 41.6  MCV 81.1  PLT 009   Basic Metabolic Panel:  Recent Labs  Lab 12/09/17 1439  NA 139  K 4.1  CL 107  CO2 20*  GLUCOSE 128*  BUN 15  CREATININE 0.94  CALCIUM 9.2   GFR: Estimated Creatinine Clearance: 99.2 mL/min (by C-G formula based on SCr of 0.94 mg/dL). Liver Function Tests: No results for input(s): AST, ALT, ALKPHOS, BILITOT, PROT, ALBUMIN in the last 168 hours. No results for input(s): LIPASE, AMYLASE in the last 168 hours. No results for input(s): AMMONIA in the last 168 hours. Coagulation Profile: No results for input(s): INR, PROTIME in the last 168 hours. Cardiac Enzymes: Recent Labs  Lab 12/09/17 1759  CKTOTAL 85  CKMB 1.6  TROPONINI <0.03   BNP (last 3 results) No results for input(s): PROBNP in the last 8760 hours. HbA1C: No results for input(s): HGBA1C in the last 72 hours. CBG: No results for input(s): GLUCAP in the last 168 hours. Lipid Profile: No results for input(s): CHOL, HDL, LDLCALC, TRIG, CHOLHDL, LDLDIRECT in the last 72 hours. Thyroid Function Tests: No results for input(s): TSH, T4TOTAL, FREET4, T3FREE, THYROIDAB in the last 72 hours. Anemia Panel: No results for input(s): VITAMINB12, FOLATE, FERRITIN, TIBC, IRON, RETICCTPCT in the last 72 hours. Urine analysis:    Component Value Date/Time   COLORURINE YELLOW 08/22/2017 0549   APPEARANCEUR HAZY (A) 08/22/2017 0549   LABSPEC 1.032 (H)  08/22/2017 0549   PHURINE 5.0 08/22/2017 0549   GLUCOSEU >=500 (A) 08/22/2017 0549   HGBUR NEGATIVE 08/22/2017 0549   BILIRUBINUR NEGATIVE 08/22/2017 0549   BILIRUBINUR neg 11/23/2012 0911   KETONESUR NEGATIVE 08/22/2017 0549   PROTEINUR 30 (A) 08/22/2017 0549   UROBILINOGEN 1.0 09/05/2013 0825   NITRITE NEGATIVE 08/22/2017 0549   LEUKOCYTESUR NEGATIVE 08/22/2017 0549    Radiological Exams on Admission: Dg Chest 2 View  Result Date: 12/09/2017 CLINICAL DATA:  Chest pain and dyspnea for 10 minutes today. EXAM: CHEST - 2 VIEW COMPARISON:  10/09/2017 CXR, chest CT 11/13/2017 FINDINGS: Status post CABG. Normal heart and mediastinal contours. Clear lungs. No effusion or pneumothorax. No acute osseous abnormality. IMPRESSION: No active cardiopulmonary disease. Electronically Signed   By: Ashley Royalty M.D.   On: 12/09/2017 15:54   EKG: Independently reviewed. normal sinus rhythm, nonspecific ST and T waves changes.  Assessment/Plan 1. Chest pain High risk for ACS with J-point elevation. Troponins are negative x2. Continue monitor on telemetry. Cardiology consulted by ED.  Will consult them as well. Keeping the patient n.p.o. after midnight. Patient is on Xarelto for his A. fib, given his significant cardiac history as well as reporting similar chest pain to his prior CAD I will switch him to IV heparin for now. Monitor serial troponins.  2.  PAF. S/P ablation. Currently normal sinus rhythm. Continue anticoagulation. Continue amiodarone.  3.  Type 2 diabetes mellitus without any complication controlled. Continue sliding scale insulin.  Hold Jardiance.  4.  OSA. Continue CPAP nightly.  5.  Dyslipidemia. Continue Lipitor.  Nutrition: Carb modified diet cardiac DVT Prophylaxis: on therapeutic anticoagulation.  Advance goals of care discussion: full code   Consults: cardiology   Family Communication: family was present at bedside, at the time of interview.  Opportunity was  given to ask question and all questions were answered satisfactorily.  Disposition: Admitted as observation telemetry unit. Likely to be discharged hopme, in 1-2 days.  Author: Berle Mull, MD [facility] 12/09/2017  If 7PM-7AM, please contact night-coverage [email or web address]

## 2017-12-09 NOTE — Progress Notes (Signed)
ANTICOAGULATION CONSULT NOTE - Initial Consult  Pharmacy Consult for Heparin Indication: chest pain/ACS  No Known Allergies  Patient Measurements: Height: 5\' 7"  (170.2 cm) Weight: 233 lb (105.7 kg) IBW/kg (Calculated) : 66.1 Heparin Dosing Weight: 89.5kg  Vital Signs: Temp: 97.1 F (36.2 C) (12/04 1402) Temp Source: Oral (12/04 1402) BP: 118/69 (12/04 1800) Pulse Rate: 64 (12/04 1800)  Labs: Recent Labs    12/09/17 1439  HGB 11.4*  HCT 41.6  PLT 362  CREATININE 0.94    Estimated Creatinine Clearance: 99.2 mL/min (by C-G formula based on SCr of 0.94 mg/dL).   Medical History: Past Medical History:  Diagnosis Date  . Arthritis       . Atrial flutter ([place])   . CAD (coronary artery disease)    multivessel per cath 02/12/13, s/p stenting to RCA  . Chronic lower back pain   . Diabetes mellitus without complication ([place])   . Dyslipidemia   . History of kidney stones   . Hx of Bell's palsy   . Hypertension   . Myocardial infarction ([place]) 02/12/2013   . Nephrolithiasis   . Neuromuscular disorder (HCC)    Bell's Palsy  . OSA on CPAP   . PE (pulmonary thromboembolism) ([place]) 02/2014  . Persistent atrial fibrillation        Assessment: CC/HPI: CP  PMH: CAD status post CABG x3, DVT, PE, afib/aflutter, type 2 diabetes mellitus, atrial flutter, nephrolithiasis, HLD, HTN, NSTEMI, arthritis, Bell's palsy, OSA,   Significant events: electrophysiology study and radiofrequency catheter ablation on 11/19/2017 by Dr. Rayann Heman. drinks a proximally 4 to 5 cups of coffee daily.  Anticoag: Afib/flutter, h/o DVT/PE on Xarelto PTA (last dose 12/3).Baseline Hgb 11.4. Plts 362  Goal of Therapy:  Heparin level 0.3-0.7 units/ml  aptt 66-102 Monitor platelets by anticoagulation protocol: Yes   Plan:  Heparin 4000 unit IV bolus Heparin infusion 1250 units/hr APTT and HL in 6-8 hrs Daily HL, aPTT, and CBC   Zabrina Brotherton S. Alford Highland, PharmD, BCPS Clinical Staff Pharmacist Eilene Ghazi Stillinger 12/09/2017,6:18 PM

## 2017-12-09 NOTE — ED Notes (Signed)
Dinner ordered 

## 2017-12-09 NOTE — Progress Notes (Signed)
Pt refusing CPAP for the night. RT will continue to monitor as needed.  

## 2017-12-10 ENCOUNTER — Encounter (HOSPITAL_COMMUNITY)
Admission: EM | Disposition: A | Discharge status: Home / Self Care | Payer: Self-pay | Source: Home / Self Care | Attending: Emergency Medicine

## 2017-12-10 DIAGNOSIS — E785 Hyperlipidemia, unspecified: Secondary | ICD-10-CM

## 2017-12-10 DIAGNOSIS — I259 Chronic ischemic heart disease, unspecified: Secondary | ICD-10-CM | POA: Diagnosis not present

## 2017-12-10 DIAGNOSIS — I2510 Atherosclerotic heart disease of native coronary artery without angina pectoris: Secondary | ICD-10-CM | POA: Diagnosis not present

## 2017-12-10 DIAGNOSIS — R079 Chest pain, unspecified: Secondary | ICD-10-CM | POA: Diagnosis not present

## 2017-12-10 DIAGNOSIS — I200 Unstable angina: Secondary | ICD-10-CM

## 2017-12-10 DIAGNOSIS — Z9861 Coronary angioplasty status: Secondary | ICD-10-CM | POA: Diagnosis not present

## 2017-12-10 DIAGNOSIS — Z951 Presence of aortocoronary bypass graft: Secondary | ICD-10-CM | POA: Diagnosis not present

## 2017-12-10 DIAGNOSIS — I4819 Other persistent atrial fibrillation: Secondary | ICD-10-CM

## 2017-12-10 HISTORY — PX: LEFT HEART CATH AND CORS/GRAFTS ANGIOGRAPHY: CATH118250

## 2017-12-10 LAB — COMPREHENSIVE METABOLIC PANEL
ALT: 27 U/L (ref 0–44)
AST: 21 U/L (ref 15–41)
Albumin: 3.7 g/dL (ref 3.5–5.0)
Alkaline Phosphatase: 64 U/L (ref 38–126)
Anion gap: 13 (ref 5–15)
BUN: 14 mg/dL (ref 6–20)
CO2: 21 mmol/L — ABNORMAL LOW (ref 22–32)
Calcium: 8.8 mg/dL — ABNORMAL LOW (ref 8.9–10.3)
Chloride: 105 mmol/L (ref 98–111)
Creatinine, Ser: 0.92 mg/dL (ref 0.61–1.24)
GFR calc Af Amer: >60 mL/min (ref >60)
GFR calc non Af Amer: >60 mL/min (ref >60)
Glucose, Bld: 104 mg/dL — ABNORMAL HIGH (ref 70–99)
Potassium: 4.2 mmol/L (ref 3.5–5.1)
Sodium: 139 mmol/L (ref 135–145)
Total Bilirubin: 0.6 mg/dL (ref 0.3–1.2)
Total Protein: 7 g/dL (ref 6.5–8.1)

## 2017-12-10 LAB — GLUCOSE, CAPILLARY
Glucose-Capillary: 113 mg/dL — ABNORMAL HIGH (ref 70–99)
Glucose-Capillary: 92 mg/dL (ref 70–99)
Glucose-Capillary: 78 mg/dL (ref 70–99)
Glucose-Capillary: 146 mg/dL — ABNORMAL HIGH (ref 70–99)

## 2017-12-10 LAB — TROPONIN I: Troponin I: <0.03 ng/mL (ref <0.03)

## 2017-12-10 LAB — CBC
HCT: 39.9 % (ref 39.0–52.0)
Hemoglobin: 11.4 g/dL — ABNORMAL LOW (ref 13.0–17.0)
MCH: 22.9 pg — ABNORMAL LOW (ref 26.0–34.0)
MCHC: 28.6 g/dL — ABNORMAL LOW (ref 30.0–36.0)
MCV: 80.1 fL (ref 80.0–100.0)
PLATELETS: 309 10*3/uL (ref 150–400)
RBC: 4.98 MIL/uL (ref 4.22–5.81)
RDW: 15.9 % — ABNORMAL HIGH (ref 11.5–15.5)
WBC: 7.5 10*3/uL (ref 4.0–10.5)
nRBC: 0 % (ref 0.0–0.2)

## 2017-12-10 LAB — APTT
aPTT: 64 seconds — ABNORMAL HIGH (ref 24–36)
aPTT: 71 seconds — ABNORMAL HIGH (ref 24–36)

## 2017-12-10 LAB — HEPARIN LEVEL (UNFRACTIONATED)
HEPARIN UNFRACTIONATED: 0.88 [IU]/mL — AB (ref 0.30–0.70)
Heparin Unfractionated: 0.8 IU/mL — ABNORMAL HIGH (ref 0.30–0.70)

## 2017-12-10 LAB — MAGNESIUM: Magnesium: 1.9 mg/dL (ref 1.7–2.4)

## 2017-12-10 LAB — POCT ACTIVATED CLOTTING TIME: Activated Clotting Time: 114 seconds

## 2017-12-10 SURGERY — LEFT HEART CATH AND CORS/GRAFTS ANGIOGRAPHY
Anesthesia: LOCAL

## 2017-12-10 MED ORDER — SODIUM CHLORIDE 0.9 % IV SOLN: 250.0000 mL | INTRAVENOUS | Status: DC | PRN

## 2017-12-10 MED ORDER — SODIUM CHLORIDE 0.9 % IV SOLN: INTRAVENOUS | Status: DC

## 2017-12-10 MED ORDER — MIDAZOLAM HCL 2 MG/2ML IJ SOLN
INTRAMUSCULAR | Status: AC
  Filled 2017-12-10: qty 2

## 2017-12-10 MED ORDER — ONDANSETRON HCL 4 MG/2ML IJ SOLN: 4.0000 mg | Freq: Four times a day (QID) | INTRAMUSCULAR | Status: DC | PRN

## 2017-12-10 MED ORDER — DIAZEPAM 5 MG PO TABS: 5.0000 mg | ORAL_TABLET | ORAL | Status: DC | PRN

## 2017-12-10 MED ORDER — SODIUM CHLORIDE 0.9 % WEIGHT BASED INFUSION: 1.0000 mL/kg/h | INTRAVENOUS | Status: DC

## 2017-12-10 MED ORDER — IOHEXOL 350 MG/ML SOLN
INTRAVENOUS | Status: DC | PRN
  Administered 2017-12-10: 90 mL via INTRACARDIAC

## 2017-12-10 MED ORDER — SODIUM CHLORIDE 0.9 % WEIGHT BASED INFUSION: 3.0000 mL/kg/h | INTRAVENOUS | Status: DC

## 2017-12-10 MED ORDER — SODIUM CHLORIDE 0.9 % IV SOLN
INTRAVENOUS | Status: AC | PRN
  Administered 2017-12-10: 10 mL/h via INTRAVENOUS

## 2017-12-10 MED ORDER — LIDOCAINE HCL (PF) 1 % IJ SOLN
INTRAMUSCULAR | Status: DC | PRN
  Administered 2017-12-10: 15 mL

## 2017-12-10 MED ORDER — PNEUMOCOCCAL VAC POLYVALENT 25 MCG/0.5ML IJ INJ
0.5000 mL | INJECTION | INTRAMUSCULAR | Status: AC
  Administered 2017-12-11: 0.5 mL via INTRAMUSCULAR
  Filled 2017-12-10: qty 0.5

## 2017-12-10 MED ORDER — ACETAMINOPHEN 325 MG PO TABS: 650.0000 mg | ORAL_TABLET | ORAL | Status: DC | PRN

## 2017-12-10 MED ORDER — HEPARIN (PORCINE) IN NACL 1000-0.9 UT/500ML-% IV SOLN
INTRAVENOUS | Status: AC
  Filled 2017-12-10: qty 1000

## 2017-12-10 MED ORDER — ATORVASTATIN CALCIUM 80 MG PO TABS
80.0000 mg | ORAL_TABLET | Freq: Every day | ORAL | Status: DC
  Administered 2017-12-10: 18:00:00 80 mg via ORAL
  Filled 2017-12-10: qty 1

## 2017-12-10 MED ORDER — ASPIRIN 81 MG PO CHEW: 81.0000 mg | CHEWABLE_TABLET | ORAL | Status: DC

## 2017-12-10 MED ORDER — LIDOCAINE HCL (PF) 1 % IJ SOLN
INTRAMUSCULAR | Status: AC
  Filled 2017-12-10: qty 30

## 2017-12-10 MED ORDER — SODIUM CHLORIDE 0.9% FLUSH: 3.0000 mL | INTRAVENOUS | Status: DC | PRN

## 2017-12-10 MED ORDER — MIDAZOLAM HCL 2 MG/2ML IJ SOLN
INTRAMUSCULAR | Status: DC | PRN
  Administered 2017-12-10: 2 mg via INTRAVENOUS
  Administered 2017-12-10: 1 mg via INTRAVENOUS

## 2017-12-10 MED ORDER — HEPARIN (PORCINE) IN NACL 1000-0.9 UT/500ML-% IV SOLN
INTRAVENOUS | Status: DC | PRN
  Administered 2017-12-10 (×2): 500 mL

## 2017-12-10 MED ORDER — FENTANYL CITRATE (PF) 100 MCG/2ML IJ SOLN
INTRAMUSCULAR | Status: AC
  Filled 2017-12-10: qty 2

## 2017-12-10 MED ORDER — RIVAROXABAN 20 MG PO TABS: 20.0000 mg | ORAL_TABLET | Freq: Every day | ORAL | Status: DC

## 2017-12-10 MED ORDER — ASPIRIN 81 MG PO CHEW: 81.0000 mg | CHEWABLE_TABLET | Freq: Every day | ORAL | Status: DC

## 2017-12-10 MED ORDER — SODIUM CHLORIDE 0.9% FLUSH: 3.0000 mL | Freq: Two times a day (BID) | INTRAVENOUS | Status: DC

## 2017-12-10 MED ORDER — FENTANYL CITRATE (PF) 100 MCG/2ML IJ SOLN
INTRAMUSCULAR | Status: DC | PRN
  Administered 2017-12-10: 25 ug via INTRAVENOUS
  Administered 2017-12-10: 50 ug via INTRAVENOUS

## 2017-12-10 MED ORDER — SODIUM CHLORIDE 0.9% FLUSH
3.0000 mL | Freq: Two times a day (BID) | INTRAVENOUS | Status: DC
  Administered 2017-12-10: 23:00:00 3 mL via INTRAVENOUS

## 2017-12-10 MED ORDER — SODIUM CHLORIDE 0.9 % IV SOLN
INTRAVENOUS | Status: DC
  Administered 2017-12-10: 17:00:00 via INTRAVENOUS

## 2017-12-10 SURGICAL SUPPLY — 11 items
CATH INFINITI 5 FR IM (CATHETERS) ×2 IMPLANT
CATH INFINITI 5FR MULTPACK ANG (CATHETERS) ×2 IMPLANT
KIT HEART LEFT (KITS) ×2 IMPLANT
PACK CARDIAC CATHETERIZATION (CUSTOM PROCEDURE TRAY) ×2 IMPLANT
SHEATH PINNACLE 5F 10CM (SHEATH) ×2 IMPLANT
SHEATH PROBE COVER 6X72 (BAG) ×2 IMPLANT
SYR MEDRAD MARK 7 150ML (SYRINGE) ×2 IMPLANT
TRANSDUCER W/STOPCOCK (MISCELLANEOUS) ×2 IMPLANT
TUBING CIL FLEX 10 FLL-RA (TUBING) ×2 IMPLANT
WIRE EMERALD 3MM-J .035X150CM (WIRE) ×2 IMPLANT
WIRE EMERALD 3MM-J .035X260CM (WIRE) ×2 IMPLANT

## 2017-12-10 NOTE — CV Procedure (Signed)
  5 Fr. Sheath was pulled manually from the R F/A, and pressure was held for 20 min.Groin is soft and non tender. Patient tolerated the procedure well, and he was given instructions about his bed rest. Bed rest starts at 1620 x 4 hr. Hr - 67 SR BP - 120/68 SPO2 - 96%

## 2017-12-10 NOTE — Interval H&P Note (Signed)
Cath Lab Visit (complete for each Cath Lab visit)  Clinical Evaluation Leading to the Procedure:   ACS: No.  Non-ACS:    Anginal Classification: CCS III  Anti-ischemic medical therapy: Minimal Therapy (1 class of medications)  Non-Invasive Test Results: No non-invasive testing performed  Prior CABG: Previous CABG      History and Physical Interval Note:  12/10/2017 2:47 PM  [name]  has presented today for surgery, with the diagnosis of cp  The various methods of treatment have been discussed with the patient and family. After consideration of risks, benefits and other options for treatment, the patient has consented to  Procedure(s): LEFT HEART CATH AND CORS/GRAFTS ANGIOGRAPHY (N/A) as a surgical intervention .  The patient's history has been reviewed, patient examined, no change in status, stable for surgery.  I have reviewed the patient's chart and labs.  Questions were answered to the patient's satisfaction.     Shelva Majestic

## 2017-12-10 NOTE — Consult Note (Addendum)
Cardiology Consultation:   Patient ID: Paul Todd; [id number]; 08-21-1960   Admit date: 12/09/2017 Date of Consult: 12/10/2017 Primary Care Provider: Everardo Beals, NP Primary Cardiologist: Dr. Mali Hilty, MD Primary Electrophysiologist: Dr. Thompson Grayer, MD  Patient Profile:   ABISHAI VIEGAS is a 57 y.o. male with a hx of persistent atrial fibrillation/flutter s/p recent ablation 11/19/2017, recent CABG without maze, DM2, HLD, HTN, and history of PE tobacco use who is being seen today for the evaluation of chest pain at the request of Dr. Posey Pronto.  History of Present Illness:   Mr. Vizcarrondo is a 57 year old male with a history stated above who presented to [facility] on 12/09/2017 with complaints of midsternal chest pain with radiation to his left neck after coming from cardiac rehab.  Patient states he was in his usual state of health and presented to cardiac rehabilitation (please 3 times a week post CABG) and reports that approximately one hour after completing he was working on the computer and began having generalized chest pain described as a tightness which felt similar to his prior MI in August 2019 however to a lesser degree.  He states after 10 minutes without dissipation he took 1 sublingual nitroglycerin and the pain subsided within several minutes. He denies associated symptoms such as nausea, vomiting, diaphoresis, shortness of breath, palpitations or syncope. He reports he has not experienced chest pain since his surgery and reports complete medication compliance. He dissipates in cardiac rehabilitation 3 times per week without complication. Given his prior history, patient reported to the emergency department for further evaluation.    In the ED, an EKG was performed which showed J point elevation in leads III and aVF, changed from prior tracings.  Repeat EKGs seem to have resolved. Troponin levels have remained negative x3 at <0.03, <0.03, <0.03 without recurrent symptoms.   CXR completed showed no active cardiopulmonary disease.  Patient was started on IV heparin and made NPO per admitting team.  Of note, patient recently underwent atrial fibrillation/flutter ablation, discharged 11/20/2017.  He underwent a cardiac CT prior to the procedure which demonstrated normal sequelae of CABG, trace pericardial effusion, and no pulmonary embolism.  The study does not comment on coronary anatomy directly.  Prior to that, he had undergone multiple failed cardioversions and has also failed medical therapy with Tikosyn and amiodarone.  Last TEE echocardiogram performed 08/31/2016 showed an LVEF of 55 to 60%.    Last heart catheterization 08/21/2017 with severe distal left main and ostial LAD stenosis confirmed by positive FFR with recommendations for TCTS consultation in which CABG was performed 08/24/2017.  He was discharged on 8/36/6294 without complication.  Has been seen in follow-up with TCTS and AF clinic however has not been seen by primary cardiology>> is an upcoming appointment on 12/18/2017.   Past Medical History:  Diagnosis Date  . Arthritis       . Atrial flutter ([place])   . CAD (coronary artery disease)    multivessel per cath 02/12/13, s/p stenting to RCA  . Chronic lower back pain   . Diabetes mellitus without complication ([place])   . Dyslipidemia   . History of kidney stones   . Hx of Bell's palsy   . Hypertension   . Myocardial infarction ([place]) 02/12/2013   . Nephrolithiasis   . Neuromuscular disorder (HCC)    Bell's Palsy  . OSA on CPAP   . PE (pulmonary thromboembolism) ([place]) 02/2014  . Persistent atrial fibrillation  Past Surgical History:  Procedure Laterality Date  . ABDOMINAL AORTOGRAM W/LOWER EXTREMITY N/A 01/26/2017   Procedure: ABDOMINAL AORTOGRAM W/LOWER EXTREMITY;  Surgeon: Angelia Mould, MD;  Location: [place] [facility];  Service: Cardiovascular;  Laterality: N/A;  . ATRIAL FIBRILLATION ABLATION  11/19/2017  . ATRIAL FIBRILLATION  ABLATION N/A 11/19/2017   Procedure: ATRIAL FIBRILLATION ABLATION;  Surgeon: Thompson Grayer, MD;  Location: [place] [facility];  Service: Cardiovascular;  Laterality: N/A;  . BACK SURGERY  2017   laminectomy  . CARDIOVERSION N/A 02/15/2013   Procedure: CARDIOVERSION;  Surgeon: Thayer Headings, MD;  Location: [place];  Service: Cardiovascular;  Laterality: N/A;  . CARDIOVERSION N/A 07/07/2014   Procedure: CARDIOVERSION;  Surgeon: Pixie Casino, MD;  Location: [facility] ENDOSCOPY;  Service: Cardiovascular;  Laterality: N/A;  . CARDIOVERSION N/A 08/31/2017   Procedure: CARDIOVERSION;  Surgeon: Dorothy Spark, MD;  Location: [facility] ENDOSCOPY;  Service: Cardiovascular;  Laterality: N/A;  . CARDIOVERSION N/A 09/29/2017   Procedure: CARDIOVERSION;  Surgeon: Sanda Klein, MD;  Location: [place];  Service: Cardiovascular;  Laterality: N/A;  . CORONARY ANGIOPLASTY WITH STENT PLACEMENT  02/12/2013  . CORONARY ARTERY BYPASS GRAFT N/A 08/24/2017   Procedure: CORONARY ARTERY BYPASS GRAFTING (CABG) X3. RIGHT ENDOSCOPIC SAPHENOUS VEIN HARVEST. MAMM TO LAD, SVG TO OM, SVG TO RAMUS;  Surgeon: Ivin Poot, MD;  Location: [place];  Service: Open Heart Surgery;  Laterality: N/A;  . CYSTOSCOPY WITH RETROGRADE PYELOGRAM, URETEROSCOPY AND STENT PLACEMENT Left 09/02/2013   Procedure: CYSTOSCOPY WITH RETROGRADE PYELOGRAM/LEFT URETEROSCOPY/URETERAL STENT, with fulguration;  Surgeon: Festus Aloe, MD;  Location: [facility];  Service: Urology;  Laterality: Left;  . CYSTOSCOPY WITH URETEROSCOPY AND STENT PLACEMENT Left 09/16/2013   Procedure: CYSTOSCOPY WITH URETEROSCOPY AND STENT PLACEMENT;  Surgeon: Festus Aloe, MD;  Location: [facility];  Service: Urology;  Laterality: Left;  . FEMORAL BYPASS Left 04/07/2017   below the knee popliteal  . FEMORAL-POPLITEAL BYPASS GRAFT Left 04/07/2017   Procedure: LEFT FEMORAL-BELOW KNEE POPLITEAL ARTERY BYPASS USING COMPOSITE LEFT GREATER SAPHENOUS VEIN;  Surgeon: Angelia Mould,  MD;  Location: [place];  Service: Vascular;  Laterality: Left;  . HOLMIUM LASER APPLICATION Left 7/51/0258   Procedure: HOLMIUM LASER APPLICATION;  Surgeon: Festus Aloe, MD;  Location: [facility];  Service: Urology;  Laterality: Left;  . INTRAVASCULAR PRESSURE WIRE/FFR STUDY N/A 08/21/2017   Procedure: INTRAVASCULAR PRESSURE WIRE/FFR STUDY;  Surgeon: Sherren Mocha, MD;  Location: [place] [facility];  Service: Cardiovascular;  Laterality: N/A;  . LEFT HEART CATH AND CORONARY ANGIOGRAPHY N/A 08/21/2017   Procedure: LEFT HEART CATH AND CORONARY ANGIOGRAPHY;  Surgeon: Sherren Mocha, MD;  Location: [place] [facility];  Service: Cardiovascular;  Laterality: N/A;  . LEFT HEART CATHETERIZATION WITH CORONARY ANGIOGRAM N/A 02/12/2013   Procedure: LEFT HEART CATHETERIZATION WITH CORONARY ANGIOGRAM;  Surgeon: Troy Sine, MD;  Location: [facility] CATH LAB;  Service: Cardiovascular;  Laterality: N/A;  . PERCUTANEOUS CORONARY STENT INTERVENTION (PCI-S)  02/12/2013   Procedure: PERCUTANEOUS CORONARY STENT INTERVENTION (PCI-S);  Surgeon: Troy Sine, MD;  Location: [facility] CATH LAB;  Service: Cardiovascular;;  STEMI  . PERCUTANEOUS CORONARY STENT INTERVENTION (PCI-S) N/A 02/14/2013   Procedure: PERCUTANEOUS CORONARY STENT INTERVENTION (PCI-S);  Surgeon: Burnell Blanks, MD;  Location: [facility] CATH LAB;  Service: Cardiovascular;  Laterality: N/A;  Proximal LAD  . TEE WITHOUT CARDIOVERSION N/A 02/15/2013   Procedure: TRANSESOPHAGEAL ECHOCARDIOGRAM (TEE);  Surgeon: Thayer Headings, MD;  Location: [place];  Service: Cardiovascular;  Laterality: N/A;  .  TEE WITHOUT CARDIOVERSION N/A 08/24/2017   Procedure: TRANSESOPHAGEAL ECHOCARDIOGRAM (TEE);  Surgeon: Prescott Gum, Collier Salina, MD;  Location: [place];  Service: Open Heart Surgery;  Laterality: N/A;  . TEE WITHOUT CARDIOVERSION N/A 08/31/2017   Procedure: TRANSESOPHAGEAL ECHOCARDIOGRAM (TEE);  Surgeon: Dorothy Spark, MD;  Location: [facility] ENDOSCOPY;  Service: Cardiovascular;   Laterality: N/A;     Prior to Admission medications   Medication Sig Start Date End Date Taking? Authorizing Provider  acetaminophen (TYLENOL) 650 MG CR tablet Take 1,300 mg by mouth every 8 (eight) hours as needed for pain.    Yes [provider]  amiodarone (PACERONE) 200 MG tablet Take 1 tablet (200 mg total) by mouth daily. 10/19/17  Yes Allred, Jeneen Rinks, MD  aspirin EC 81 MG tablet Take 81 mg by mouth daily.   Yes [provider]  atorvastatin (LIPITOR) 80 MG tablet Take 80 mg by mouth daily.   Yes [provider]  docusate sodium (COLACE) 100 MG capsule Take 100 mg by mouth 2 (two) times daily.  01/08/17  Yes [provider]  DULoxetine (CYMBALTA) 60 MG capsule Take 60 mg by mouth every evening.   Yes [provider]  JARDIANCE 10 MG TABS tablet Take 10 mg by mouth daily. 08/10/17  Yes [provider]  magnesium oxide (MAG-OX) 400 (241.3 MG) MG tablet Take 1 tablet (400 mg total) by mouth daily. Patient taking differently: Take 250 mg by mouth daily.  07/25/14  Yes Seiler, Luetta Nutting K, NP  metoprolol tartrate (LOPRESSOR) 25 MG tablet Take 1 tablet (25 mg total) by mouth 2 (two) times daily. 11/20/17  Yes Baldwin Jamaica, PA-C  Multiple Vitamin (MULTIVITAMIN WITH MINERALS) TABS tablet Take 1 tablet by mouth daily.    Yes [provider]  nitroGLYCERIN (NITROSTAT) 0.4 MG SL tablet PLACE 1 TABLET UNDER TONGUE EVERY 5 MINUTES AS NEEDED FOR CHEST PAIN MAX 3 TABLETS Patient taking differently: Place 0.4 mg under the tongue every 5 (five) minutes as needed for chest pain.  07/20/17  Yes Hilty, Nadean Corwin, MD  NON FORMULARY at bedtime. CPAP   Yes [provider]  pantoprazole (PROTONIX) 40 MG tablet Take 1 tablet (40 mg total) by mouth daily. 10/20/17  Yes HiltyNadean Corwin, MD  Potassium 99 MG TABS Take 99 mg by mouth daily.    Yes [provider]  XARELTO 20 MG TABS tablet TAKE 1 TABLET (20 MG TOTAL) DAILY WITH SUPPER BY  MOUTH. Patient taking differently: Take 20 mg by mouth daily with supper.  12/02/17  Yes Allred, Jeneen Rinks, MD  ACCU-CHEK AVIVA PLUS test strip USE UP TO 4 TIMES DAILY AS DIRECTED 09/17/17   Barrett, Erin R, PA-C  ACCU-CHEK SOFTCLIX LANCETS lancets USE UP TO 4 TIMES DAILY AS DIRECTED 10/18/17   Ivin Poot, MD  blood glucose meter kit and supplies KIT Dispense based on patient and insurance preference. Use up to four times daily as directed. (FOR ICD-9 250.00, 250.01). 09/02/17   Barrett, Lodema Hong, PA-C    Inpatient Medications: Scheduled Meds: . amiodarone  200 mg Oral Daily  . aspirin EC  81 mg Oral Daily  . atorvastatin  80 mg Oral Daily  . DULoxetine  60 mg Oral QPM  . insulin aspart  0-5 Units Subcutaneous QHS  . insulin aspart  0-9 Units Subcutaneous TID WC  . metoprolol tartrate  25 mg Oral BID  . pantoprazole  40 mg Oral Daily   Continuous Infusions: . heparin 1,400 Units/hr (12/10/17  0155)   PRN Meds: acetaminophen, ALPRAZolam, morphine injection, nitroGLYCERIN, ondansetron (ZOFRAN) IV  Allergies:   No Known Allergies  Social History:   Social History   Socioeconomic History  . Marital status: Married    Spouse name: Not on file  . Number of children: Not on file  . Years of education: Not on file  . Highest education level: Not on file  Occupational History  . Not on file  Social Needs  . Financial resource strain: Not on file  . Food insecurity:    Worry: Not on file    Inability: Not on file  . Transportation needs:    Medical: Not on file    Non-medical: Not on file  Tobacco Use  . Smoking status: Former Smoker    Packs/day: 0.50    Years: 35.00    Pack years: 17.50    Types: Cigarettes    Last attempt to quit: 02/12/2013    Years since quitting: 4.8  . Smokeless tobacco: Never Used  Substance and Sexual Activity  . Alcohol use: No    Comment: 07/21/2014 "never drank much; might have a couple drinks/yr since MI 02/2013"  . Drug use: No  . Sexual  activity: Yes    Birth control/protection: None  Lifestyle  . Physical activity:    Days per week: Not on file    Minutes per session: Not on file  . Stress: Not on file  Relationships  . Social connections:    Talks on phone: Not on file    Gets together: Not on file    Attends religious service: Not on file    Active member of club or organization: Not on file    Attends meetings of clubs or organizations: Not on file    Relationship status: Not on file  . Intimate partner violence:    Fear of current or ex partner: Not on file    Emotionally abused: Not on file    Physically abused: Not on file    Forced sexual activity: Not on file  Other Topics Concern  . Not on file  Social History Narrative  . Not on file    Family History:   Family History  Problem Relation Age of Onset  . Heart disease Mother   . Diabetes Mother   . Stroke Mother   . Diabetes Father    Family Status:  Family Status  Relation Name Status  . Mother  Deceased  . Father  Alive  . MGM  Deceased  . MGF  Deceased  . PGM  Deceased  . PGF  Deceased    ROS:  Please see the history of present illness.  All other ROS reviewed and negative.     Physical Exam/Data:   Vitals:   12/09/17 1837 12/09/17 2135 12/10/17 0159 12/10/17 0609  BP: 130/80 124/80 117/72 128/73  Pulse: 62 73 66 68  Resp: _0 Temp: (!) 97.5 F (36.4 C) 97.8 F (36.6 C) 97.9 F (36.6 C) 97.7 F (36.5 C)  TempSrc: Oral Oral Oral Oral  SpO2: 95% 96% 96% 98%  Weight:      Height:        Intake/Output Summary (Last 24 hours) at 12/10/2017 0739 Last data filed at 12/10/2017 0300 Gross per 24 hour  Intake 145.48 ml  Output -  Net 145.48 ml   Filed Weights   12/09/17 1805  Weight: 105.7 kg   Body mass index is 36.49 kg/m.  General: Well developed, well nourished, NAD Skin: Warm, dry, intact  Head: Normocephalic, atraumatic, clear, moist mucus membranes. Neck: Negative for carotid bruits. No  JVD Lungs:Clear to ausculation bilaterally. No wheezes, rales, or rhonchi. Breathing is unlabored. Cardiovascular: RRR with S1 S2. No murmurs, rubs, gallops, or LV heave appreciated. Abdomen: Soft, non-tender, non-distended with normoactive bowel sounds. No obvious abdominal masses. MSK: Strength and tone appear normal for age. 5/5 in all extremities Extremities: No edema. No clubbing or cyanosis. DP/PT pulses 2+ bilaterally Neuro: Alert and oriented. No focal deficits. No facial asymmetry. MAE spontaneously. Psych: Responds to questions appropriately with normal affect.     EKG:  The EKG was personally reviewed and demonstrates: Initial EKG from 12/09/2017 with questionable J-point elevation in leads III and aVF which have now resolved on subsequent tracings.  Last tracing from 12/09/2017 at 1755 with NSR HR 64 and no J-point elevation Telemetry:  Telemetry was personally reviewed and demonstrates: 12/10/17 NSR   Relevant CV Studies:  ECHO: 08/31/2017: Study Conclusions  - Left ventricle: The cavity size was normal. Wall thickness was   normal. Systolic function was normal. The estimated ejection   fraction was in the range of 55% to 60%. No evidence of thrombus. - Mitral valve: No evidence of vegetation. - Left atrium: No evidence of thrombus in the atrial cavity or   appendage. No evidence of thrombus in the atrial cavity or   appendage. - Right ventricle: The cavity size was normal. Wall thickness was   normal. Systolic function was normal. - Right atrium: No evidence of thrombus in the atrial cavity or   appendage. - Atrial septum: Hypermobile interatrial septum.  Impressions:  - Successful cardioversion. No cardiac source of emboli was   indentified.  CATH: [facility] 08/21/2017:  The left ventricular ejection fraction is 55-65% by visual estimate.  LV end diastolic pressure is normal.  The left ventricular systolic function is normal.  Previously placed Prox RCA to Mid RCA  stent (unknown type) is widely patent.  Dist LM lesion is 75% stenosed.  Ost LAD lesion is 75% stenosed.  Previously placed Prox LAD stent (unknown type) is widely patent.   1. Severe distal left main and ostial LAD stenosis confirmed by positive FFR analysis (0.76) 2. Continued patency of the stented segments in the LAD and RCA 3. Patent left circumflex 4. Normal LV systolic function  Recommend: TCTS consult for CABG. IV heparin, inpatient status with severe left main stenosis.  Laboratory Data:  Chemistry Recent Labs  Lab 12/09/17 1439 12/10/17 0530  NA 139 139  K 4.1 4.2  CL 107 105  CO2 20* 21*  GLUCOSE 128* 104*  BUN 15 14  CREATININE 0.94 0.92  CALCIUM 9.2 8.8*  GFRNONAA >60 >60  GFRAA >60 >60  ANIONGAP 12 13    Total Protein  Date Value Ref Range Status  12/10/2017 7.0 6.5 - 8.1 g/dL Final   Albumin  Date Value Ref Range Status  12/10/2017 3.7 3.5 - 5.0 g/dL Final   AST  Date Value Ref Range Status  12/10/2017 21 15 - 41 U/L Final   ALT  Date Value Ref Range Status  12/10/2017 27 0 - 44 U/L Final   Alkaline Phosphatase  Date Value Ref Range Status  12/10/2017 64 38 - 126 U/L Final   Total Bilirubin  Date Value Ref Range Status  12/10/2017 0.6 0.3 - 1.2 mg/dL Final   Hematology Recent Labs  Lab 12/09/17 1439 12/10/17 0530  WBC 8.2 7.5  RBC 5.13 4.98  HGB 11.4* 11.4*  HCT 41.6 39.9  MCV 81.1 80.1  MCH 22.2* 22.9*  MCHC 27.4* 28.6*  RDW 15.9* 15.9*  PLT 362 309   Cardiac Enzymes Recent Labs  Lab 12/09/17 1759 12/10/17 0009 12/10/17 0530  TROPONINI <0.03 <0.03 <0.03    Recent Labs  Lab 12/09/17 1442  TROPIPOC 0.00    BNPNo results for input(s): BNP, PROBNP in the last 168 hours.  DDimer No results for input(s): DDIMER in the last 168 hours. TSH:  Lab Results  Component Value Date   TSH 0.819 08/22/2017   Lipids: Lab Results  Component Value Date   CHOL 88 08/29/2017   HDL 23 (L) 08/29/2017   LDLCALC 41 08/29/2017    TRIG 118 08/29/2017   CHOLHDL 3.8 08/29/2017   HgbA1c: Lab Results  Component Value Date   HGBA1C 7.4 (H) 08/22/2017    Radiology/Studies:  Dg Chest 2 View  Result Date: 12/09/2017 CLINICAL DATA:  Chest pain and dyspnea for 10 minutes today. EXAM: CHEST - 2 VIEW COMPARISON:  10/09/2017 CXR, chest CT 11/13/2017 FINDINGS: Status post CABG. Normal heart and mediastinal contours. Clear lungs. No effusion or pneumothorax. No acute osseous abnormality. IMPRESSION: No active cardiopulmonary disease. Electronically Signed   By: Ashley Royalty M.D.   On: 12/09/2017 15:54   Assessment and Plan:   1. Chest pain with negative troponin: -Patient presented to [facility] on 12[facility]2016 after one episode of anterior chest pain with radiation to left neck, similar to prior MI symptoms lasting approximately 10 minutes in duration which resolved with 1 sublingual nitroglycerin.   -EKG performed showed possible J-point elevation in leads III and aVF with subsequent EKGs appearing to have resolved. -Troponin level negative x3,<0.03, <0.03, <0.03 without recurrent symptoms  -Patient recently underwent EP ablation for atrial fibrillation/flutter which was successful after multiple attempts with DCCV and medical treatment with Amio and Tikosyn -Last cardiac catheterization with severe multivessel disease including left main prior to CABGx 3, completed 08/2016 -Continue ASA 81, high intensity atorvastatin 80, metoprolol tartrate 25 -In the setting of symptoms similar to her prior MI and ST changes on EKG, would opt for further coronary evaluation with coronary CT versus cardiac catheterization for more definitive evaluation -Plan for cardiac cath per MD -Creatinine stable at 0.92, HR in mid 60's   2.  Persistent atrial fibrillation s/p recent atrial fibrillation/atrial flutter ablation 11/2017: -Patient had multiple failed cardioversions as well as failed medical therapy with Tikosyn and amiodarone and underwent a successful  atrial fibrillation/atrial flutter ablation and was discharged 11/20/2016 with close follow-up in the atrial fibrillation clinic -Reports no recurrent symptoms, continues on metoprolol tartrate 25 and amiodarone 200 daily -NSR per telemetry review -Continues with Xarelto>> transitioned to IV heparin gtt in the setting of chest pain -CHA2DS2VASc =3   3. DM2: -Elevated, last hemoglobin A1c 08/22/2016, 7.4 -SSI for glucose control while inpatient status -Resume home regimen at discharge  4. Hyperlipidemia: -Stable, last LDL 08/29/2017, 41 -Continue atorvastatin  For questions or updates, please contact [place] Please consult [email or web address] for contact info under Cardiology/STEMI.   SignedKathyrn Drown NP-C [facility] Pager: [phone] 12/10/2017 7:39 AM  ---------------------------------------------------------------------------------------------   History and all data above reviewed.  Patient examined.  I agree with the findings as above.  ISHMAEL BERKOVICH is a pleasant 57 yo male with known CAD with previous PCI and left main disease with subsequent 3 V CABG (LIMA LAD, SVG-[place], SVG-OM) in August who presents today with one episode  of unstable angina reminiscent of his pre-bypass angina. Participating in cardiac rehab without issue since last week, has not had significant increase in energy after bypass but has not felt poorly, no angina with activity.   Constitutional: No acute distress Eyes: pupils equally round and reactive to light, sclera non-icteric, normal conjunctiva and lids ENMT: normal dentition, moist mucous membranes CHEST: well healed sternotomy scar. Cardiovascular: regular rhythm, normal rate, no murmurs. S1 and S2 normal. Radial pulses normal bilaterally, 3/4 left radial, 4/4 right radial. No jugular venous distention.  Respiratory: clear to auscultation bilaterally GI : normal bowel sounds, soft and nontender. No distention.   MSK: extremities warm, well  perfused. No edema.  NEURO: grossly nonfocal exam, moves all extremities. PSYCH: alert and oriented x 3, normal mood and affect.   All available labs, radiology testing, previous records reviewed. Agree with documented assessment and plan of my colleague as stated above with the following additions or changes:  Principal Problem:   Chest pain Active Problems:   PAF (paroxysmal atrial fibrillation) (HCC)   Dyslipidemia   Unstable angina - Post Infarct Angina with existing LAD disease   CAD S/P percutaneous coronary angioplasty - DES to Mid LAD; 2x 50m x 38 mm Xience DES to RCA   OSA (obstructive sleep apnea)   Persistent atrial fibrillation   S/P CABG x 3   Type 2 diabetes mellitus without complication, without long-term current use of insulin (HCC)   History of DVT (deep vein thrombosis)    Unstable angina - had a nitro responsive episode of unstable angina, and mild ECG changes that has improved (ST change in III and aVF on yesterday's admission ECG). No troponin elevation. We participated in shared decision making, and determined that a diagnostic coronary angiogram would be performed today. He has been on heparin and his DOAC has been on hold.     INFORMED CONSENT: I have reviewed the risks, indications, and alternatives to cardiac catheterization, possible angioplasty, and stenting with the patient. Risks include but are not limited to bleeding, infection, vascular injury, stroke, myocardial infection, arrhythmia, kidney injury, radiation-related injury in the case of prolonged fluoroscopy use, emergency cardiac surgery, and death. The patient understands the risks of serious complication is 1-2 in [id number] diagnostic cardiac cath and 1-2% or less with angioplasty/stenting.    Length of Stay:  LOS: 0 days   GElouise Munroe MD [facility] 10:59 AM  12/10/2017

## 2017-12-10 NOTE — H&P (View-Only) (Signed)
Cardiology Consultation:   Patient ID: [name]; [id number]; 08-21-1960   Admit date: 12/09/2017 Date of Consult: 12/10/2017 Primary Care Provider: Everardo Beals, NP Primary Cardiologist: Dr. Mali Hilty, MD Primary Electrophysiologist: Dr. Thompson Grayer, MD  Patient Profile:   Paul Todd is a 57 y.o. male with a hx of persistent atrial fibrillation/flutter s/p recent ablation 11/19/2017, recent CABG without maze, DM2, HLD, HTN, and history of PE tobacco use who is being seen today for the evaluation of chest pain at the request of Dr. Posey Todd.  History of Present Illness:   Paul Todd is a 57 year old male with a history stated above who presented to [facility] on 12/09/2017 with complaints of midsternal chest pain with radiation to his left neck after coming from cardiac rehab.  Patient states he was in his usual state of health and presented to cardiac rehabilitation (please 3 times a week post CABG) and reports that approximately one hour after completing he was working on the computer and began having generalized chest pain described as a tightness which felt similar to his prior MI in August 2019 however to a lesser degree.  He states after 10 minutes without dissipation he took 1 sublingual nitroglycerin and the pain subsided within several minutes. He denies associated symptoms such as nausea, vomiting, diaphoresis, shortness of breath, palpitations or syncope. He reports he has not experienced chest pain since his surgery and reports complete medication compliance. He dissipates in cardiac rehabilitation 3 times per week without complication. Given his prior history, patient reported to the emergency department for further evaluation.    In the ED, an EKG was performed which showed J point elevation in leads III and aVF, changed from prior tracings.  Repeat EKGs seem to have resolved. Troponin levels have remained negative x3 at <0.03, <0.03, <0.03 without recurrent symptoms.   CXR completed showed no active cardiopulmonary disease.  Patient was started on IV heparin and made NPO per admitting team.  Of note, patient recently underwent atrial fibrillation/flutter ablation, discharged 11/20/2017.  He underwent a cardiac CT prior to the procedure which demonstrated normal sequelae of CABG, trace pericardial effusion, and no pulmonary embolism.  The study does not comment on coronary anatomy directly.  Prior to that, he had undergone multiple failed cardioversions and has also failed medical therapy with Tikosyn and amiodarone.  Last TEE echocardiogram performed 08/31/2016 showed an LVEF of 55 to 60%.    Last heart catheterization 08/21/2017 with severe distal left main and ostial LAD stenosis confirmed by positive FFR with recommendations for TCTS consultation in which CABG was performed 08/24/2017.  He was discharged on 8/36/6294 without complication.  Has been seen in follow-up with TCTS and AF clinic however has not been seen by primary cardiology>> is an upcoming appointment on 12/18/2017.   Past Medical History:  Diagnosis Date  . Arthritis       . Atrial flutter ([place])   . CAD (coronary artery disease)    multivessel per cath 02/12/13, s/p stenting to RCA  . Chronic lower back pain   . Diabetes mellitus without complication ([place])   . Dyslipidemia   . History of kidney stones   . Hx of Bell's palsy   . Hypertension   . Myocardial infarction ([place]) 02/12/2013   . Nephrolithiasis   . Neuromuscular disorder (HCC)    Bell's Palsy  . OSA on CPAP   . PE (pulmonary thromboembolism) ([place]) 02/2014  . Persistent atrial fibrillation  Past Surgical History:  Procedure Laterality Date  . ABDOMINAL AORTOGRAM W/LOWER EXTREMITY N/A 01/26/2017   Procedure: ABDOMINAL AORTOGRAM W/LOWER EXTREMITY;  Surgeon: Angelia Mould, MD;  Location: [place] [facility];  Service: Cardiovascular;  Laterality: N/A;  . ATRIAL FIBRILLATION ABLATION  11/19/2017  . ATRIAL FIBRILLATION  ABLATION N/A 11/19/2017   Procedure: ATRIAL FIBRILLATION ABLATION;  Surgeon: Paul Grayer, MD;  Location: [place] [facility];  Service: Cardiovascular;  Laterality: N/A;  . BACK SURGERY  2017   laminectomy  . CARDIOVERSION N/A 02/15/2013   Procedure: CARDIOVERSION;  Surgeon: Thayer Headings, MD;  Location: [place];  Service: Cardiovascular;  Laterality: N/A;  . CARDIOVERSION N/A 07/07/2014   Procedure: CARDIOVERSION;  Surgeon: Pixie Casino, MD;  Location: [facility] ENDOSCOPY;  Service: Cardiovascular;  Laterality: N/A;  . CARDIOVERSION N/A 08/31/2017   Procedure: CARDIOVERSION;  Surgeon: Dorothy Spark, MD;  Location: [facility] ENDOSCOPY;  Service: Cardiovascular;  Laterality: N/A;  . CARDIOVERSION N/A 09/29/2017   Procedure: CARDIOVERSION;  Surgeon: Sanda Klein, MD;  Location: [place];  Service: Cardiovascular;  Laterality: N/A;  . CORONARY ANGIOPLASTY WITH STENT PLACEMENT  02/12/2013  . CORONARY ARTERY BYPASS GRAFT N/A 08/24/2017   Procedure: CORONARY ARTERY BYPASS GRAFTING (CABG) X3. RIGHT ENDOSCOPIC SAPHENOUS VEIN HARVEST. MAMM TO LAD, SVG TO OM, SVG TO RAMUS;  Surgeon: Ivin Poot, MD;  Location: [place];  Service: Open Heart Surgery;  Laterality: N/A;  . CYSTOSCOPY WITH RETROGRADE PYELOGRAM, URETEROSCOPY AND STENT PLACEMENT Left 09/02/2013   Procedure: CYSTOSCOPY WITH RETROGRADE PYELOGRAM/LEFT URETEROSCOPY/URETERAL STENT, with fulguration;  Surgeon: Festus Aloe, MD;  Location: [facility];  Service: Urology;  Laterality: Left;  . CYSTOSCOPY WITH URETEROSCOPY AND STENT PLACEMENT Left 09/16/2013   Procedure: CYSTOSCOPY WITH URETEROSCOPY AND STENT PLACEMENT;  Surgeon: Festus Aloe, MD;  Location: [facility];  Service: Urology;  Laterality: Left;  . FEMORAL BYPASS Left 04/07/2017   below the knee popliteal  . FEMORAL-POPLITEAL BYPASS GRAFT Left 04/07/2017   Procedure: LEFT FEMORAL-BELOW KNEE POPLITEAL ARTERY BYPASS USING COMPOSITE LEFT GREATER SAPHENOUS VEIN;  Surgeon: Angelia Mould,  MD;  Location: [place];  Service: Vascular;  Laterality: Left;  . HOLMIUM LASER APPLICATION Left 7/51/0258   Procedure: HOLMIUM LASER APPLICATION;  Surgeon: Festus Aloe, MD;  Location: [facility];  Service: Urology;  Laterality: Left;  . INTRAVASCULAR PRESSURE WIRE/FFR STUDY N/A 08/21/2017   Procedure: INTRAVASCULAR PRESSURE WIRE/FFR STUDY;  Surgeon: Sherren Mocha, MD;  Location: [place] [facility];  Service: Cardiovascular;  Laterality: N/A;  . LEFT HEART CATH AND CORONARY ANGIOGRAPHY N/A 08/21/2017   Procedure: LEFT HEART CATH AND CORONARY ANGIOGRAPHY;  Surgeon: Sherren Mocha, MD;  Location: [place] [facility];  Service: Cardiovascular;  Laterality: N/A;  . LEFT HEART CATHETERIZATION WITH CORONARY ANGIOGRAM N/A 02/12/2013   Procedure: LEFT HEART CATHETERIZATION WITH CORONARY ANGIOGRAM;  Surgeon: Troy Sine, MD;  Location: [facility] CATH LAB;  Service: Cardiovascular;  Laterality: N/A;  . PERCUTANEOUS CORONARY STENT INTERVENTION (PCI-S)  02/12/2013   Procedure: PERCUTANEOUS CORONARY STENT INTERVENTION (PCI-S);  Surgeon: Troy Sine, MD;  Location: [facility] CATH LAB;  Service: Cardiovascular;;  STEMI  . PERCUTANEOUS CORONARY STENT INTERVENTION (PCI-S) N/A 02/14/2013   Procedure: PERCUTANEOUS CORONARY STENT INTERVENTION (PCI-S);  Surgeon: Burnell Blanks, MD;  Location: [facility] CATH LAB;  Service: Cardiovascular;  Laterality: N/A;  Proximal LAD  . TEE WITHOUT CARDIOVERSION N/A 02/15/2013   Procedure: TRANSESOPHAGEAL ECHOCARDIOGRAM (TEE);  Surgeon: Thayer Headings, MD;  Location: [place];  Service: Cardiovascular;  Laterality: N/A;  .  TEE WITHOUT CARDIOVERSION N/A 08/24/2017   Procedure: TRANSESOPHAGEAL ECHOCARDIOGRAM (TEE);  Surgeon: Prescott Gum, Collier Salina, MD;  Location: [place];  Service: Open Heart Surgery;  Laterality: N/A;  . TEE WITHOUT CARDIOVERSION N/A 08/31/2017   Procedure: TRANSESOPHAGEAL ECHOCARDIOGRAM (TEE);  Surgeon: Dorothy Spark, MD;  Location: [facility] ENDOSCOPY;  Service: Cardiovascular;   Laterality: N/A;     Prior to Admission medications   Medication Sig Start Date End Date Taking? Authorizing Provider  acetaminophen (TYLENOL) 650 MG CR tablet Take 1,300 mg by mouth every 8 (eight) hours as needed for pain.    Yes [provider]  amiodarone (PACERONE) 200 MG tablet Take 1 tablet (200 mg total) by mouth daily. 10/19/17  Yes Allred, Jeneen Rinks, MD  aspirin EC 81 MG tablet Take 81 mg by mouth daily.   Yes [provider]  atorvastatin (LIPITOR) 80 MG tablet Take 80 mg by mouth daily.   Yes [provider]  docusate sodium (COLACE) 100 MG capsule Take 100 mg by mouth 2 (two) times daily.  01/08/17  Yes [provider]  DULoxetine (CYMBALTA) 60 MG capsule Take 60 mg by mouth every evening.   Yes [provider]  JARDIANCE 10 MG TABS tablet Take 10 mg by mouth daily. 08/10/17  Yes [provider]  magnesium oxide (MAG-OX) 400 (241.3 MG) MG tablet Take 1 tablet (400 mg total) by mouth daily. Patient taking differently: Take 250 mg by mouth daily.  07/25/14  Yes Seiler, Luetta Nutting K, NP  metoprolol tartrate (LOPRESSOR) 25 MG tablet Take 1 tablet (25 mg total) by mouth 2 (two) times daily. 11/20/17  Yes Baldwin Jamaica, PA-C  Multiple Vitamin (MULTIVITAMIN WITH MINERALS) TABS tablet Take 1 tablet by mouth daily.    Yes [provider]  nitroGLYCERIN (NITROSTAT) 0.4 MG SL tablet PLACE 1 TABLET UNDER TONGUE EVERY 5 MINUTES AS NEEDED FOR CHEST PAIN MAX 3 TABLETS Patient taking differently: Place 0.4 mg under the tongue every 5 (five) minutes as needed for chest pain.  07/20/17  Yes Todd, Nadean Corwin, MD  NON FORMULARY at bedtime. CPAP   Yes [provider]  pantoprazole (PROTONIX) 40 MG tablet Take 1 tablet (40 mg total) by mouth daily. 10/20/17  Yes HiltyNadean Corwin, MD  Potassium 99 MG TABS Take 99 mg by mouth daily.    Yes [provider]  XARELTO 20 MG TABS tablet TAKE 1 TABLET (20 MG TOTAL) DAILY WITH SUPPER BY  MOUTH. Patient taking differently: Take 20 mg by mouth daily with supper.  12/02/17  Yes Allred, Jeneen Rinks, MD  ACCU-CHEK AVIVA PLUS test strip USE UP TO 4 TIMES DAILY AS DIRECTED 09/17/17   Barrett, Erin R, PA-C  ACCU-CHEK SOFTCLIX LANCETS lancets USE UP TO 4 TIMES DAILY AS DIRECTED 10/18/17   Ivin Poot, MD  blood glucose meter kit and supplies KIT Dispense based on patient and insurance preference. Use up to four times daily as directed. (FOR ICD-9 250.00, 250.01). 09/02/17   Barrett, Lodema Hong, PA-C    Inpatient Medications: Scheduled Meds: . amiodarone  200 mg Oral Daily  . aspirin EC  81 mg Oral Daily  . atorvastatin  80 mg Oral Daily  . DULoxetine  60 mg Oral QPM  . insulin aspart  0-5 Units Subcutaneous QHS  . insulin aspart  0-9 Units Subcutaneous TID WC  . metoprolol tartrate  25 mg Oral BID  . pantoprazole  40 mg Oral Daily   Continuous Infusions: . heparin 1,400 Units/hr (12/10/17  0155)   PRN Meds: acetaminophen, ALPRAZolam, morphine injection, nitroGLYCERIN, ondansetron (ZOFRAN) IV  Allergies:   No Known Allergies  Social History:   Social History   Socioeconomic History  . Marital status: Married    Spouse name: Not on file  . Number of children: Not on file  . Years of education: Not on file  . Highest education level: Not on file  Occupational History  . Not on file  Social Needs  . Financial resource strain: Not on file  . Food insecurity:    Worry: Not on file    Inability: Not on file  . Transportation needs:    Medical: Not on file    Non-medical: Not on file  Tobacco Use  . Smoking status: Former Smoker    Packs/day: 0.50    Years: 35.00    Pack years: 17.50    Types: Cigarettes    Last attempt to quit: 02/12/2013    Years since quitting: 4.8  . Smokeless tobacco: Never Used  Substance and Sexual Activity  . Alcohol use: No    Comment: 07/21/2014 "never drank much; might have a couple drinks/yr since MI 02/2013"  . Drug use: No  . Sexual  activity: Yes    Birth control/protection: None  Lifestyle  . Physical activity:    Days per week: Not on file    Minutes per session: Not on file  . Stress: Not on file  Relationships  . Social connections:    Talks on phone: Not on file    Gets together: Not on file    Attends religious service: Not on file    Active member of club or organization: Not on file    Attends meetings of clubs or organizations: Not on file    Relationship status: Not on file  . Intimate partner violence:    Fear of current or ex partner: Not on file    Emotionally abused: Not on file    Physically abused: Not on file    Forced sexual activity: Not on file  Other Topics Concern  . Not on file  Social History Narrative  . Not on file    Family History:   Family History  Problem Relation Age of Onset  . Heart disease Mother   . Diabetes Mother   . Stroke Mother   . Diabetes Father    Family Status:  Family Status  Relation Name Status  . Mother  Deceased  . Father  Alive  . MGM  Deceased  . MGF  Deceased  . PGM  Deceased  . PGF  Deceased    ROS:  Please see the history of present illness.  All other ROS reviewed and negative.     Physical Exam/Data:   Vitals:   12/09/17 1837 12/09/17 2135 12/10/17 0159 12/10/17 0609  BP: 130/80 124/80 117/72 128/73  Pulse: 62 73 66 68  Resp: _0 Temp: (!) 97.5 F (36.4 C) 97.8 F (36.6 C) 97.9 F (36.6 C) 97.7 F (36.5 C)  TempSrc: Oral Oral Oral Oral  SpO2: 95% 96% 96% 98%  Weight:      Height:        Intake/Output Summary (Last 24 hours) at 12/10/2017 0739 Last data filed at 12/10/2017 0300 Gross per 24 hour  Intake 145.48 ml  Output -  Net 145.48 ml   Filed Weights   12/09/17 1805  Weight: 105.7 kg   Body mass index is 36.49 kg/m.  General: Well developed, well nourished, NAD Skin: Warm, dry, intact  Head: Normocephalic, atraumatic, clear, moist mucus membranes. Neck: Negative for carotid bruits. No  JVD Lungs:Clear to ausculation bilaterally. No wheezes, rales, or rhonchi. Breathing is unlabored. Cardiovascular: RRR with S1 S2. No murmurs, rubs, gallops, or LV heave appreciated. Abdomen: Soft, non-tender, non-distended with normoactive bowel sounds. No obvious abdominal masses. MSK: Strength and tone appear normal for age. 5/5 in all extremities Extremities: No edema. No clubbing or cyanosis. DP/PT pulses 2+ bilaterally Neuro: Alert and oriented. No focal deficits. No facial asymmetry. MAE spontaneously. Psych: Responds to questions appropriately with normal affect.     EKG:  The EKG was personally reviewed and demonstrates: Initial EKG from 12/09/2017 with questionable J-point elevation in leads III and aVF which have now resolved on subsequent tracings.  Last tracing from 12/09/2017 at 1755 with NSR HR 64 and no J-point elevation Telemetry:  Telemetry was personally reviewed and demonstrates: 12/10/17 NSR   Relevant CV Studies:  ECHO: 08/31/2017: Study Conclusions  - Left ventricle: The cavity size was normal. Wall thickness was   normal. Systolic function was normal. The estimated ejection   fraction was in the range of 55% to 60%. No evidence of thrombus. - Mitral valve: No evidence of vegetation. - Left atrium: No evidence of thrombus in the atrial cavity or   appendage. No evidence of thrombus in the atrial cavity or   appendage. - Right ventricle: The cavity size was normal. Wall thickness was   normal. Systolic function was normal. - Right atrium: No evidence of thrombus in the atrial cavity or   appendage. - Atrial septum: Hypermobile interatrial septum.  Impressions:  - Successful cardioversion. No cardiac source of emboli was   indentified.  CATH: [facility] 08/21/2017:  The left ventricular ejection fraction is 55-65% by visual estimate.  LV end diastolic pressure is normal.  The left ventricular systolic function is normal.  Previously placed Prox RCA to Mid RCA  stent (unknown type) is widely patent.  Dist LM lesion is 75% stenosed.  Ost LAD lesion is 75% stenosed.  Previously placed Prox LAD stent (unknown type) is widely patent.   1. Severe distal left main and ostial LAD stenosis confirmed by positive FFR analysis (0.76) 2. Continued patency of the stented segments in the LAD and RCA 3. Patent left circumflex 4. Normal LV systolic function  Recommend: TCTS consult for CABG. IV heparin, inpatient status with severe left main stenosis.  Laboratory Data:  Chemistry Recent Labs  Lab 12/09/17 1439 12/10/17 0530  NA 139 139  K 4.1 4.2  CL 107 105  CO2 20* 21*  GLUCOSE 128* 104*  BUN 15 14  CREATININE 0.94 0.92  CALCIUM 9.2 8.8*  GFRNONAA >60 >60  GFRAA >60 >60  ANIONGAP 12 13    Total Protein  Date Value Ref Range Status  12/10/2017 7.0 6.5 - 8.1 g/dL Final   Albumin  Date Value Ref Range Status  12/10/2017 3.7 3.5 - 5.0 g/dL Final   AST  Date Value Ref Range Status  12/10/2017 21 15 - 41 U/L Final   ALT  Date Value Ref Range Status  12/10/2017 27 0 - 44 U/L Final   Alkaline Phosphatase  Date Value Ref Range Status  12/10/2017 64 38 - 126 U/L Final   Total Bilirubin  Date Value Ref Range Status  12/10/2017 0.6 0.3 - 1.2 mg/dL Final   Hematology Recent Labs  Lab 12/09/17 1439 12/10/17 0530  WBC 8.2 7.5  RBC 5.13 4.98  HGB 11.4* 11.4*  HCT 41.6 39.9  MCV 81.1 80.1  MCH 22.2* 22.9*  MCHC 27.4* 28.6*  RDW 15.9* 15.9*  PLT 362 309   Cardiac Enzymes Recent Labs  Lab 12/09/17 1759 12/10/17 0009 12/10/17 0530  TROPONINI <0.03 <0.03 <0.03    Recent Labs  Lab 12/09/17 1442  TROPIPOC 0.00    BNPNo results for input(s): BNP, PROBNP in the last 168 hours.  DDimer No results for input(s): DDIMER in the last 168 hours. TSH:  Lab Results  Component Value Date   TSH 0.819 08/22/2017   Lipids: Lab Results  Component Value Date   CHOL 88 08/29/2017   HDL 23 (L) 08/29/2017   LDLCALC 41 08/29/2017    TRIG 118 08/29/2017   CHOLHDL 3.8 08/29/2017   HgbA1c: Lab Results  Component Value Date   HGBA1C 7.4 (H) 08/22/2017    Radiology/Studies:  Dg Chest 2 View  Result Date: 12/09/2017 CLINICAL DATA:  Chest pain and dyspnea for 10 minutes today. EXAM: CHEST - 2 VIEW COMPARISON:  10/09/2017 CXR, chest CT 11/13/2017 FINDINGS: Status post CABG. Normal heart and mediastinal contours. Clear lungs. No effusion or pneumothorax. No acute osseous abnormality. IMPRESSION: No active cardiopulmonary disease. Electronically Signed   By: Ashley Royalty M.D.   On: 12/09/2017 15:54   Assessment and Plan:   1. Chest pain with negative troponin: -Patient presented to [facility] on 12[facility]/2016 after one episode of anterior chest pain with radiation to left neck, similar to prior MI symptoms lasting approximately 10 minutes in duration which resolved with 1 sublingual nitroglycerin.   -EKG performed showed possible J-point elevation in leads III and aVF with subsequent EKGs appearing to have resolved. -Troponin level negative x3,<0.03, <0.03, <0.03 without recurrent symptoms  -Patient recently underwent EP ablation for atrial fibrillation/flutter which was successful after multiple attempts with DCCV and medical treatment with Amio and Tikosyn -Last cardiac catheterization with severe multivessel disease including left main prior to CABGx 3, completed 08/2016 -Continue ASA 81, high intensity atorvastatin 80, metoprolol tartrate 25 -In the setting of symptoms similar to her prior MI and ST changes on EKG, would opt for further coronary evaluation with coronary CT versus cardiac catheterization for more definitive evaluation -Plan for cardiac cath per MD -Creatinine stable at 0.92, HR in mid 60's   2.  Persistent atrial fibrillation s/p recent atrial fibrillation/atrial flutter ablation 11/2017: -Patient had multiple failed cardioversions as well as failed medical therapy with Tikosyn and amiodarone and underwent a successful  atrial fibrillation/atrial flutter ablation and was discharged 11/20/2016 with close follow-up in the atrial fibrillation clinic -Reports no recurrent symptoms, continues on metoprolol tartrate 25 and amiodarone 200 daily -NSR per telemetry review -Continues with Xarelto>> transitioned to IV heparin gtt in the setting of chest pain -CHA2DS2VASc =3   3. DM2: -Elevated, last hemoglobin A1c 08/22/2016, 7.4 -SSI for glucose control while inpatient status -Resume home regimen at discharge  4. Hyperlipidemia: -Stable, last LDL 08/29/2017, 41 -Continue atorvastatin  For questions or updates, please contact [place] Please consult [email or web address] for contact info under Cardiology/STEMI.   SignedKathyrn Drown NP-C [facility] Pager: [phone] 12/10/2017 7:39 AM  ---------------------------------------------------------------------------------------------   History and all data above reviewed.  Patient examined.  I agree with the findings as above.  Paul Todd is a pleasant 57 yo male with known CAD with previous PCI and left main disease with subsequent 3 V CABG (LIMA LAD, SVG-[place], SVG-OM) in August who presents today with one episode  of unstable angina reminiscent of his pre-bypass angina. Participating in cardiac rehab without issue since last week, has not had significant increase in energy after bypass but has not felt poorly, no angina with activity.   Constitutional: No acute distress Eyes: pupils equally round and reactive to light, sclera non-icteric, normal conjunctiva and lids ENMT: normal dentition, moist mucous membranes CHEST: well healed sternotomy scar. Cardiovascular: regular rhythm, normal rate, no murmurs. S1 and S2 normal. Radial pulses normal bilaterally, 3/4 left radial, 4/4 right radial. No jugular venous distention.  Respiratory: clear to auscultation bilaterally GI : normal bowel sounds, soft and nontender. No distention.   MSK: extremities warm, well  perfused. No edema.  NEURO: grossly nonfocal exam, moves all extremities. PSYCH: alert and oriented x 3, normal mood and affect.   All available labs, radiology testing, previous records reviewed. Agree with documented assessment and plan of my colleague as stated above with the following additions or changes:  Principal Problem:   Chest pain Active Problems:   PAF (paroxysmal atrial fibrillation) (HCC)   Dyslipidemia   Unstable angina - Post Infarct Angina with existing LAD disease   CAD S/P percutaneous coronary angioplasty - DES to Mid LAD; 2x 50m x 38 mm Xience DES to RCA   OSA (obstructive sleep apnea)   Persistent atrial fibrillation   S/P CABG x 3   Type 2 diabetes mellitus without complication, without long-term current use of insulin (HCC)   History of DVT (deep vein thrombosis)    Unstable angina - had a nitro responsive episode of unstable angina, and mild ECG changes that has improved (ST change in III and aVF on yesterday's admission ECG). No troponin elevation. We participated in shared decision making, and determined that a diagnostic coronary angiogram would be performed today. He has been on heparin and his DOAC has been on hold.     INFORMED CONSENT: I have reviewed the risks, indications, and alternatives to cardiac catheterization, possible angioplasty, and stenting with the patient. Risks include but are not limited to bleeding, infection, vascular injury, stroke, myocardial infection, arrhythmia, kidney injury, radiation-related injury in the case of prolonged fluoroscopy use, emergency cardiac surgery, and death. The patient understands the risks of serious complication is 1-2 in 17847with diagnostic cardiac cath and 1-2% or less with angioplasty/stenting.    Length of Stay:  LOS: 0 days   GElouise Munroe MD [facility] 10:59 AM  12/10/2017

## 2017-12-10 NOTE — Progress Notes (Signed)
ANTICOAGULATION CONSULT NOTE - Follow Up Consult  Pharmacy Consult for Heparin (Xarelto on hold) Indication: chest pain/ACS, atrial fibrillation and history of VTE  No Known Allergies  Patient Measurements: Height: 5\' 7"  (170.2 cm) Weight: 233 lb (105.7 kg) IBW/kg (Calculated) : 66.1  Vital Signs: Temp: 97.8 F (36.6 C) (12/04 2135) Temp Source: Oral (12/04 2135) BP: 124/80 (12/04 2135) Pulse Rate: 73 (12/04 2135)  Labs: Recent Labs    12/09/17 1439 12/09/17 1759 12/10/17 0009  HGB 11.4*  --   --   HCT 41.6  --   --   PLT 362  --   --   APTT  --   --  64*  HEPARINUNFRC  --   --  0.88*  CREATININE 0.94  --   --   CKTOTAL  --  85  --   CKMB  --  1.6  --   TROPONINI  --  <0.03 <0.03    Estimated Creatinine Clearance: 99.2 mL/min (by C-G formula based on SCr of 0.94 mg/dL).   Assessment: 57 y/o M on Xarelto PTA for afib, hx VTE. Now holding Xarelto and starting heparin for CP. Initial aPTT is just below goal. Using aPTT to dose for now given Xarelto influence on heparin levels.   Goal of Therapy:  Heparin level 0.3-0.7 units/ml aPTT 66-102 seconds Monitor platelets by anticoagulation protocol: Yes   Plan:  Inc heparin to 1400 units/hr Re-check heparin level/aPTT in 6-8 hours  Narda Bonds 12/10/2017,1:37 AM

## 2017-12-10 NOTE — Progress Notes (Signed)
Progress Note    GAVRIEL HOLZHAUER  MOL:[id number] DOB: 07-Jun-1960  DOA: 12/09/2017 PCP: Everardo Beals, NP    Brief Narrative:     Medical records reviewed and are as summarized below:  [name] is an 57 y.o. male with Past medical history of CAD S/P CABG, type II DM, atrial flutter with recent ablation, history of PE. Patient presented with complaints of chest pain.  He mentions that after coming from cardiac rehab 1 hour later he started having generalized central chest pain which felt like chest tightness.  It felt similar to his prior MI in August 2019 but and milder version.  The pain did not subside in 10 minutes and therefore he took a nitroglycerin and within 5 minutes the pain subsided.  Assessment/Plan:   Principal Problem:   Chest pain Active Problems:   PAF (paroxysmal atrial fibrillation) (HCC)   Dyslipidemia   Unstable angina - Post Infarct Angina with existing LAD disease   CAD S/P percutaneous coronary angioplasty - DES to Mid LAD; 2x 46mm x 38 mm Xience DES to RCA   OSA (obstructive sleep apnea)   Persistent atrial fibrillation   S/P CABG x 3   Type 2 diabetes mellitus without complication, without long-term current use of insulin (HCC)   History of DVT (deep vein thrombosis)  Chest pain: -appreciate cardiology evaluation -s/p cath: Medical therapy for native CAD.  High potency statin therapy with target LDL less than 70%.  Recommend resumption of Xarelto tomorrow and continue low-dose aspirin therapy.  obesity Body mass index is 36.49 kg/m.  HLD -statin  DM-type II -SSI -holding home meds for now  OSA -cPAP as willing  PAF -s/p ablation -amiodarone /resume xarelto in AM   Family Communication/Anticipated D/C date and plan/Code Status   DVT prophylaxis: xarelto Code Status: Full Code.  Family Communication: at bedside Disposition Plan: home in AM   Medical Consultants:    cards  Subjective:   No further chest  pain  Objective:    Vitals:   12/10/17 1526 12/10/17 1531 12/10/17 1549 12/10/17 1600  BP: 128/77 121/77 128/71 120/76  Pulse: 68 68 64 64  Resp: 17 15 18 19   Temp:      TempSrc:      SpO2: 100% 99% 95% 97%  Weight:      Height:        Intake/Output Summary (Last 24 hours) at 12/10/2017 1625 Last data filed at 12/10/2017 0900 Gross per 24 hour  Intake 145.48 ml  Output -  Net 145.48 ml   Filed Weights   12/09/17 1805 12/10/17 1218  Weight: 105.7 kg 105.7 kg    Exam: In chair, NAD Obese male rrr No increased work of breathing +BS, soft  Data Reviewed:   I have personally reviewed following labs and imaging studies:  Labs: Labs show the following:   Basic Metabolic Panel: Recent Labs  Lab 12/09/17 1439 12/10/17 0530  NA 139 139  K 4.1 4.2  CL 107 105  CO2 20* 21*  GLUCOSE 128* 104*  BUN 15 14  CREATININE 0.94 0.92  CALCIUM 9.2 8.8*  MG  --  1.9   GFR Estimated Creatinine Clearance: 101.4 mL/min (by C-G formula based on SCr of 0.92 mg/dL). Liver Function Tests: Recent Labs  Lab 12/10/17 0530  AST 21  ALT 27  ALKPHOS 64  BILITOT 0.6  PROT 7.0  ALBUMIN 3.7   No results for input(s): LIPASE, AMYLASE in the last 168 hours.  No results for input(s): AMMONIA in the last 168 hours. Coagulation profile No results for input(s): INR, PROTIME in the last 168 hours.  CBC: Recent Labs  Lab 12/09/17 1439 12/10/17 0530  WBC 8.2 7.5  HGB 11.4* 11.4*  HCT 41.6 39.9  MCV 81.1 80.1  PLT 362 309   Cardiac Enzymes: Recent Labs  Lab 12/09/17 1759 12/10/17 0009 12/10/17 0530  CKTOTAL 85  --   --   CKMB 1.6  --   --   TROPONINI <0.03 <0.03 <0.03   BNP (last 3 results) No results for input(s): PROBNP in the last 8760 hours. CBG: Recent Labs  Lab 12/09/17 2108 12/10/17 0650 12/10/17 1217 12/10/17 1556  GLUCAP 139* 113* 92 78   D-Dimer: No results for input(s): DDIMER in the last 72 hours. Hgb A1c: No results for input(s): HGBA1C in the  last 72 hours. Lipid Profile: No results for input(s): CHOL, HDL, LDLCALC, TRIG, CHOLHDL, LDLDIRECT in the last 72 hours. Thyroid function studies: No results for input(s): TSH, T4TOTAL, T3FREE, THYROIDAB in the last 72 hours.  Invalid input(s): FREET3 Anemia work up: No results for input(s): VITAMINB12, FOLATE, FERRITIN, TIBC, IRON, RETICCTPCT in the last 72 hours. Sepsis Labs: Recent Labs  Lab 12/09/17 1439 12/10/17 0530  WBC 8.2 7.5    Microbiology No results found for this or any previous visit (from the past 240 hour(s)).  Procedures and diagnostic studies:  Dg Chest 2 View  Result Date: 12/09/2017 CLINICAL DATA:  Chest pain and dyspnea for 10 minutes today. EXAM: CHEST - 2 VIEW COMPARISON:  10/09/2017 CXR, chest CT 11/13/2017 FINDINGS: Status post CABG. Normal heart and mediastinal contours. Clear lungs. No effusion or pneumothorax. No acute osseous abnormality. IMPRESSION: No active cardiopulmonary disease. Electronically Signed   By: Ashley Royalty M.D.   On: 12/09/2017 15:54    Medications:   . amiodarone  200 mg Oral Daily  . aspirin EC  81 mg Oral Daily  . atorvastatin  80 mg Oral q1800  . DULoxetine  60 mg Oral QPM  . insulin aspart  0-5 Units Subcutaneous QHS  . insulin aspart  0-9 Units Subcutaneous TID WC  . metoprolol tartrate  25 mg Oral BID  . pantoprazole  40 mg Oral Daily  . [START ON 12/11/2017] rivaroxaban  20 mg Oral Q supper  . sodium chloride flush  3 mL Intravenous Q12H   Continuous Infusions: . sodium chloride    . sodium chloride     Followed by  . sodium chloride       LOS: 0 days   Geradine Girt  [facility]   *Please refer to [email or web address], password [email or web address] to get updated schedule on who will round on this patient, as hospitalists switch teams weekly. If 7PM-7AM, please contact night-coverage at [email or web address], password [email or web address] for any overnight needs.  12/10/2017, 4:25 PM

## 2017-12-10 NOTE — Progress Notes (Signed)
Patient refused CPAP. RT will continue to monitor as needed throughout the night.

## 2017-12-10 NOTE — Progress Notes (Addendum)
ANTICOAGULATION CONSULT NOTE - Follow Up Consult  Pharmacy Consult for Heparin (Xarelto on hold) Indication: chest pain/ACS, atrial fibrillation and history of VTE  No Known Allergies  Patient Measurements: Height: 5\' 7"  (170.2 cm) Weight: 233 lb (105.7 kg) IBW/kg (Calculated) : 66.1  Vital Signs: Temp: 97.7 F (36.5 C) (12/05 0609) Temp Source: Oral (12/05 0609) BP: 138/78 (12/05 0934) Pulse Rate: 74 (12/05 0934)  Labs: Recent Labs    12/09/17 1439 12/09/17 1759 12/10/17 0009 12/10/17 0530  HGB 11.4*  --   --  11.4*  HCT 41.6  --   --  39.9  PLT 362  --   --  309  APTT  --   --  64*  --   HEPARINUNFRC  --   --  0.88*  --   CREATININE 0.94  --   --  0.92  CKTOTAL  --  85  --   --   CKMB  --  1.6  --   --   TROPONINI  --  <0.03 <0.03 <0.03    Estimated Creatinine Clearance: 101.4 mL/min (by C-G formula based on SCr of 0.92 mg/dL).   Assessment: 57 y/o M on Xarelto PTA for afib, hx VTE. Now holding Xarelto and starting heparin for CP.   aPTT level this morning is therapeutic after a rate increase this morning (aPTT 71 << 64, goal of 66-102 seconds). Heparin level remains falsely elevated in the setting of recent Xarelto.   Goal of Therapy:  Heparin level 0.3-0.7 units/ml aPTT 66-102 seconds Monitor platelets by anticoagulation protocol: Yes   Plan:  - Continue Heparin at 1400 units/hr (14 ml/hr) - Will continue to monitor for any signs/symptoms of bleeding and will follow up with aPTT in 6 hours to confirm therapeutic  Thank you for allowing pharmacy to be a part of this patient's care.  Alycia Rossetti, PharmD, BCPS Clinical Pharmacist Pager: [phone] Clinical phone for 12/10/2017 from 7a-3:30p: [phone] If after 3:30p, please call main pharmacy at: [phone] Please check AMION for all [place] numbers 12/10/2017 10:06 AM

## 2017-12-11 ENCOUNTER — Encounter (HOSPITAL_COMMUNITY): Payer: BLUE CROSS/BLUE SHIELD

## 2017-12-11 ENCOUNTER — Encounter (HOSPITAL_COMMUNITY): Payer: Self-pay | Admitting: Cardiovascular Disease

## 2017-12-11 DIAGNOSIS — I2510 Atherosclerotic heart disease of native coronary artery without angina pectoris: Secondary | ICD-10-CM | POA: Diagnosis not present

## 2017-12-11 DIAGNOSIS — Z794 Long term (current) use of insulin: Secondary | ICD-10-CM | POA: Diagnosis not present

## 2017-12-11 DIAGNOSIS — Z951 Presence of aortocoronary bypass graft: Secondary | ICD-10-CM

## 2017-12-11 DIAGNOSIS — I200 Unstable angina: Secondary | ICD-10-CM | POA: Diagnosis not present

## 2017-12-11 DIAGNOSIS — Z6836 Body mass index (BMI) 36.0-36.9, adult: Secondary | ICD-10-CM | POA: Diagnosis not present

## 2017-12-11 DIAGNOSIS — E119 Type 2 diabetes mellitus without complications: Secondary | ICD-10-CM | POA: Diagnosis not present

## 2017-12-11 DIAGNOSIS — G4733 Obstructive sleep apnea (adult) (pediatric): Secondary | ICD-10-CM | POA: Diagnosis not present

## 2017-12-11 DIAGNOSIS — I259 Chronic ischemic heart disease, unspecified: Secondary | ICD-10-CM | POA: Diagnosis not present

## 2017-12-11 DIAGNOSIS — Z9861 Coronary angioplasty status: Secondary | ICD-10-CM

## 2017-12-11 DIAGNOSIS — I25119 Atherosclerotic heart disease of native coronary artery with unspecified angina pectoris: Secondary | ICD-10-CM | POA: Diagnosis not present

## 2017-12-11 DIAGNOSIS — Z23 Encounter for immunization: Secondary | ICD-10-CM | POA: Diagnosis not present

## 2017-12-11 DIAGNOSIS — I480 Paroxysmal atrial fibrillation: Secondary | ICD-10-CM | POA: Diagnosis not present

## 2017-12-11 DIAGNOSIS — G709 Myoneural disorder, unspecified: Secondary | ICD-10-CM | POA: Diagnosis not present

## 2017-12-11 DIAGNOSIS — I252 Old myocardial infarction: Secondary | ICD-10-CM | POA: Diagnosis not present

## 2017-12-11 DIAGNOSIS — Z955 Presence of coronary angioplasty implant and graft: Secondary | ICD-10-CM | POA: Diagnosis not present

## 2017-12-11 DIAGNOSIS — Z7901 Long term (current) use of anticoagulants: Secondary | ICD-10-CM | POA: Diagnosis not present

## 2017-12-11 DIAGNOSIS — E669 Obesity, unspecified: Secondary | ICD-10-CM | POA: Diagnosis not present

## 2017-12-11 DIAGNOSIS — Z87891 Personal history of nicotine dependence: Secondary | ICD-10-CM | POA: Diagnosis not present

## 2017-12-11 DIAGNOSIS — G510 Bell's palsy: Secondary | ICD-10-CM | POA: Diagnosis not present

## 2017-12-11 DIAGNOSIS — M1990 Unspecified osteoarthritis, unspecified site: Secondary | ICD-10-CM | POA: Diagnosis not present

## 2017-12-11 DIAGNOSIS — I10 Essential (primary) hypertension: Secondary | ICD-10-CM | POA: Diagnosis not present

## 2017-12-11 LAB — CBC
HEMATOCRIT: 40.2 % (ref 39.0–52.0)
Hemoglobin: 11.3 g/dL — ABNORMAL LOW (ref 13.0–17.0)
MCH: 22.4 pg — ABNORMAL LOW (ref 26.0–34.0)
MCHC: 28.1 g/dL — ABNORMAL LOW (ref 30.0–36.0)
MCV: 79.8 fL — ABNORMAL LOW (ref 80.0–100.0)
Platelets: 345 10*3/uL (ref 150–400)
RBC: 5.04 MIL/uL (ref 4.22–5.81)
RDW: 16 % — ABNORMAL HIGH (ref 11.5–15.5)
WBC: 8.1 10*3/uL (ref 4.0–10.5)
nRBC: 0 % (ref 0.0–0.2)

## 2017-12-11 LAB — BASIC METABOLIC PANEL
ANION GAP: 9 (ref 5–15)
BUN: 12 mg/dL (ref 6–20)
CO2: 23 mmol/L (ref 22–32)
Calcium: 8.7 mg/dL — ABNORMAL LOW (ref 8.9–10.3)
Chloride: 108 mmol/L (ref 98–111)
Creatinine, Ser: 0.84 mg/dL (ref 0.61–1.24)
GFR calc non Af Amer: >60 mL/min (ref >60)
Glucose, Bld: 103 mg/dL — ABNORMAL HIGH (ref 70–99)
Potassium: 4.2 mmol/L (ref 3.5–5.1)
Sodium: 140 mmol/L (ref 135–145)

## 2017-12-11 LAB — GLUCOSE, CAPILLARY: Glucose-Capillary: 100 mg/dL — ABNORMAL HIGH (ref 70–99)

## 2017-12-11 NOTE — Discharge Summary (Signed)
Physician Discharge Summary  Paul Todd:[id number] DOB: [birth year] DOA: 12/09/2017  PCP: Everardo Beals, NP  Admit date: 12/09/2017 Discharge date: 12/11/2017  Time spent: 45 minutes  Recommendations for Outpatient Follow-up:  1. Follow up with cardiology as scheduled   Discharge Diagnoses:  Principal Problem:   Chest pain Active Problems:   PAF (paroxysmal atrial fibrillation) ([facility])   Dyslipidemia   Unstable angina - Post Infarct Angina with existing LAD disease   CAD S/P percutaneous coronary angioplasty - DES to Mid LAD; 2x 5m x 38 mm Xience DES to RCA   OSA (obstructive sleep apnea)   Persistent atrial fibrillation   S/P CABG x 3   Type 2 diabetes mellitus without complication, without long-term current use of insulin ([place]   History of DVT (deep vein thrombosis)   Discharge Condition: stable  Diet recommendation: heart healthy  Filed Weights   12/09/17 1805 12/10/17 1218 12/11/17 0327  Weight: 105.7 kg 105.7 kg 106.6 kg    History of present illness:  Paul AKHTARis a 57y.o. male with a hx of persistent atrial fibrillation/flutter s/p recent ablation 11/19/2017, recent CABG without maze, DM2, HLD, HTN, and history of PE tobacco use who was admitted 12/10/17 for CP. He is 3 weeks post CAGG. ekg showed J joint elevation. Troponin neg. Chest xray negative. IV heparin started. In addition pt recently underwent atrial fibrillation/flutter ablation 11/20/17   Hospital Course:  Chest pain/unstable angina. Underwent cardiac cath. Cards recommending medical therapy for native CAD. Patient pain free on discharge. Has appointment with Dr HDebara Pickettin 1 week. Hold on cardiac rehab until then  obesity Body mass index is 36.49 kg/m.  HLD -statin  DM-type II -resume home regimen  OSA -cPAP   PAF -s/p ablation -amiodarone /Elige Koxarelto    Procedures: Cath 12/09/17 Consultations:  Dr AMargaretann Lovelesscardiology  Discharge Exam: Vitals:   12/11/17  0327 12/11/17 0711  BP: 131/70 125/81  Pulse:  69  Resp: (!) 21 17  Temp: 97.6 F (36.4 C) 98.1 F (36.7 C)  SpO2: 98% 97%    General: sitting in chair. Obese in no acute distress. Reading the paper Cardiovascular: rrr no LE edema Respiratory: normal effort BS distant but clear no crackles or wheezes  Discharge Instructions   Discharge Instructions    Call MD for:  difficulty breathing, headache or visual disturbances   Complete by:  As directed    Call MD for:  persistant dizziness or light-headedness   Complete by:  As directed    Call MD for:  persistant nausea and vomiting   Complete by:  As directed    Call MD for:  severe uncontrolled pain   Complete by:  As directed    Diet - low sodium heart healthy   Complete by:  As directed    Discharge instructions   Complete by:  As directed    Follow up with cardiology as scheduled   Increase activity slowly   Complete by:  As directed      Allergies as of 12/11/2017   No Known Allergies     Medication List    TAKE these medications   ACCU-CHEK AVIVA PLUS test strip Generic drug:  glucose blood USE UP TO 4 TIMES DAILY AS DIRECTED   ACCU-CHEK SOFTCLIX LANCETS lancets USE UP TO 4 TIMES DAILY AS DIRECTED   acetaminophen 650 MG CR tablet Commonly known as:  TYLENOL Take 1,300 mg by mouth every 8 (eight) hours as needed for pain.  amiodarone 200 MG tablet Commonly known as:  PACERONE Take 1 tablet (200 mg total) by mouth daily.   aspirin EC 81 MG tablet Take 81 mg by mouth daily.   atorvastatin 80 MG tablet Commonly known as:  LIPITOR Take 80 mg by mouth daily.   blood glucose meter kit and supplies Kit Dispense based on patient and insurance preference. Use up to four times daily as directed. (FOR ICD-9 250.00, 250.01).   docusate sodium 100 MG capsule Commonly known as:  COLACE Take 100 mg by mouth 2 (two) times daily.   DULoxetine 60 MG capsule Commonly known as:  CYMBALTA Take 60 mg by mouth every  evening.   JARDIANCE 10 MG Tabs tablet Generic drug:  empagliflozin Take 10 mg by mouth daily.   magnesium oxide 400 (241.3 Mg) MG tablet Commonly known as:  MAG-OX Take 1 tablet (400 mg total) by mouth daily. What changed:  how much to take   metoprolol tartrate 25 MG tablet Commonly known as:  LOPRESSOR Take 1 tablet (25 mg total) by mouth 2 (two) times daily.   multivitamin with minerals Tabs tablet Take 1 tablet by mouth daily.   nitroGLYCERIN 0.4 MG SL tablet Commonly known as:  NITROSTAT PLACE 1 TABLET UNDER TONGUE EVERY 5 MINUTES AS NEEDED FOR CHEST PAIN MAX 3 TABLETS What changed:  See the new instructions.   NON FORMULARY at bedtime. CPAP   pantoprazole 40 MG tablet Commonly known as:  PROTONIX Take 1 tablet (40 mg total) by mouth daily.   Potassium 99 MG Tabs Take 99 mg by mouth daily.   XARELTO 20 MG Tabs tablet Generic drug:  rivaroxaban TAKE 1 TABLET (20 MG TOTAL) DAILY WITH SUPPER BY MOUTH. What changed:  See the new instructions.      No Known Allergies Follow-up Information    Sherran Needs, NP Follow up on 12/14/2017.   Specialties:  Nurse Practitioner, Cardiology Why:  at 9:00 AM in a fib clinic for post abaltion call their office today or for parking insturctions.   Contact information: [place] [address] [phone]        Pixie Casino, MD Follow up on 12/18/2017.   Specialty:  Cardiology Why:  at 10:00 AM Contact information: [place] [address] [phone]            The results of significant diagnostics from this hospitalization (including imaging, microbiology, ancillary and laboratory) are listed below for reference.    Significant Diagnostic Studies: Dg Chest 2 View  Result Date: 12/09/2017 CLINICAL DATA:  Chest pain and dyspnea for 10 minutes today. EXAM: CHEST - 2 VIEW COMPARISON:  10/09/2017 CXR, chest CT 11/13/2017 FINDINGS: Status post CABG. Normal heart and  mediastinal contours. Clear lungs. No effusion or pneumothorax. No acute osseous abnormality. IMPRESSION: No active cardiopulmonary disease. Electronically Signed   By: Ashley Royalty M.D.   On: 12/09/2017 15:54   Ct Cardiac Morph/pulm Vein W/cm&w/o Ca Score  Addendum Date: 11/13/2017   ADDENDUM REPORT: 11/13/2017 13:07 CLINICAL DATA:  57 year old male with h/o CABG, maze procedure, atrial fibrillation and atrial flutter that failed medical therapy that is scheduled for an ablation. EXAM: Cardiac CT/CTA TECHNIQUE: The patient was scanned on a Siemens Somatom scanner. FINDINGS: A 120 kV prospective scan was triggered in the descending thoracic aorta at 111 HU's. Gantry rotation speed was 280 msecs and collimation was .9 mm. No beta blockade and no NTG was given. The 3D data  set was reconstructed in 5% intervals of the 60-80 % of the R-R cycle. Diastolic phases were analyzed on a dedicated work station using MPR, MIP and VRT modes. The patient received 80 cc of contrast. There is normal pulmonary vein drainage into the left atrium (2 on the right and 2 on the left) with ostial measurements as follows: RUPV: 24.7 x 17.8 mm RLPV: 21.9 x 18.7 mm LUPV: 18.1 x 11.3 mm LLPV: 15.5 x 12.6 mm The left atrial appendage is relatively small and has no evidence for a thrombus. The esophagus runs in the left atrial midline and is not in the proximity to any of the pulmonary veins. Aorta:  Normal caliber.  No dissection, minimal calcifications. Aortic Valve:  Trileaflet.  No calcifications. IMPRESSION: 1. There is normal pulmonary vein drainage into the left atrium. 2. The left atrial appendage is relatively small and has no evidence for a thrombus. 3. The esophagus runs in the left atrial midline and is not in the proximity to any of the pulmonary veins. 4. Dilated pulmonary artery measuring 33 mm. Electronically Signed   By: [name]   On: 11/13/2017 13:07   Result Date: 11/13/2017 EXAM: OVER-READ INTERPRETATION  CT  CHEST The following report is an over-read performed by radiologist Dr. Vinnie Langton of [facility], [place] on 11/13/2017. This over-read does not include interpretation of cardiac or coronary anatomy or pathology. The coronary calcium score/coronary CTA interpretation by the cardiologist is attached. COMPARISON:  Chest CT 10/09/2017. FINDINGS: Median sternotomy wires from prior CABG. Within the visualized portions of the thorax there are no suspicious appearing pulmonary nodules or masses, there is no acute consolidative airspace disease, no pleural effusions, no pneumothorax and no lymphadenopathy. Visualized portions of the upper abdomen are unremarkable. There are no aggressive appearing lytic or blastic lesions noted in the visualized portions of the skeleton. IMPRESSION: 1. No significant incidental noncardiac findings are noted. Electronically Signed: By: Vinnie Langton M.D. On: 11/13/2017 10:51    Microbiology: No results found for this or any previous visit (from the past 240 hour(s)).   Labs: Basic Metabolic Panel: Recent Labs  Lab 12/09/17 1439 12/10/17 0530 12/11/17 0331  NA 139 139 140  K 4.1 4.2 4.2  CL 107 105 108  CO2 20* 21* 23  GLUCOSE 128* 104* 103*  BUN _0 CREATININE 0.94 0.92 0.84  CALCIUM 9.2 8.8* 8.7*  MG  --  1.9  --    Liver Function Tests: Recent Labs  Lab 12/10/17 0530  AST 21  ALT 27  ALKPHOS 64  BILITOT 0.6  PROT 7.0  ALBUMIN 3.7   No results for input(s): LIPASE, AMYLASE in the last 168 hours. No results for input(s): AMMONIA in the last 168 hours. CBC: Recent Labs  Lab 12/09/17 1439 12/10/17 0530 12/11/17 0331  WBC 8.2 7.5 8.1  HGB 11.4* 11.4* 11.3*  HCT 41.6 39.9 40.2  MCV 81.1 80.1 79.8*  PLT 362 309 345   Cardiac Enzymes: Recent Labs  Lab 12/09/17 1759 12/10/17 0009 12/10/17 0530  CKTOTAL 85  --   --   CKMB 1.6  --   --   TROPONINI <0.03 <0.03 <0.03   BNP: BNP (last 3 results) No results for input(s): BNP in  the last 8760 hours.  ProBNP (last 3 results) No results for input(s): PROBNP in the last 8760 hours.  CBG: Recent Labs  Lab 12/10/17 0650 12/10/17 1217 12/10/17 1556 12/10/17 2109 12/11/17 0644  GLUCAP 113* 92  78 146* 100*       Signed:  Radene Gunning NP  [facility] 12/11/2017, 10:55 AM

## 2017-12-11 NOTE — Progress Notes (Signed)
Progress Note  Patient Name: Paul Todd Date of Encounter: 12/11/2017  Primary Cardiologist: Pixie Casino, MD   Subjective   Feels well, no concerns.  Inpatient Medications    Scheduled Meds: . amiodarone  200 mg Oral Daily  . aspirin EC  81 mg Oral Daily  . atorvastatin  80 mg Oral q1800  . DULoxetine  60 mg Oral QPM  . insulin aspart  0-5 Units Subcutaneous QHS  . insulin aspart  0-9 Units Subcutaneous TID WC  . metoprolol tartrate  25 mg Oral BID  . pantoprazole  40 mg Oral Daily  . pneumococcal 23 valent vaccine  0.5 mL Intramuscular Tomorrow-1000  . rivaroxaban  20 mg Oral Q supper  . sodium chloride flush  3 mL Intravenous Q12H   Continuous Infusions: . sodium chloride    . sodium chloride Stopped (12/11/17 0014)  . sodium chloride 100 mL/hr at 12/11/17 0300   PRN Meds: sodium chloride, acetaminophen, ALPRAZolam, diazepam, morphine injection, nitroGLYCERIN, ondansetron (ZOFRAN) IV, sodium chloride flush   Vital Signs    Vitals:   12/10/17 1940 12/10/17 2000 12/11/17 0327 12/11/17 0711  BP: 116/74  131/70 125/81  Pulse: 62 70  69  Resp: 20 17 (!) 21 17  Temp: 98.1 F (36.7 C)  97.6 F (36.4 C) 98.1 F (36.7 C)  TempSrc: Oral  Oral Oral  SpO2: 97% 96% 98% 97%  Weight:   106.6 kg   Height:        Intake/Output Summary (Last 24 hours) at 12/11/2017 0848 Last data filed at 12/11/2017 0700 Gross per 24 hour  Intake 1506.67 ml  Output 1650 ml  Net -143.33 ml   Filed Weights   12/09/17 1805 12/10/17 1218 12/11/17 0327  Weight: 105.7 kg 105.7 kg 106.6 kg    Telemetry    unremarkable - Personally Reviewed  ECG    NSR - Personally Reviewed  Physical Exam   GEN: No acute distress.   Neck: No JVD Cardiac: regular rhythm, normal rate, no murmurs, rubs, or gallops.  CHEST: well healed sternotomy scar Respiratory: Clear to auscultation bilaterally. GI: Soft, nontender, non-distended  MS: No edema; No deformity. Neuro:  Nonfocal  Psych:  Normal affect   Labs    Chemistry Recent Labs  Lab 12/09/17 1439 12/10/17 0530 12/11/17 0331  NA 139 139 140  K 4.1 4.2 4.2  CL 107 105 108  CO2 20* 21* 23  GLUCOSE 128* 104* 103*  BUN 15 14 12   CREATININE 0.94 0.92 0.84  CALCIUM 9.2 8.8* 8.7*  PROT  --  7.0  --   ALBUMIN  --  3.7  --   AST  --  21  --   ALT  --  27  --   ALKPHOS  --  64  --   BILITOT  --  0.6  --   GFRNONAA >60 >60 >60  GFRAA >60 >60 >60  ANIONGAP 12 13 9      Hematology Recent Labs  Lab 12/09/17 1439 12/10/17 0530 12/11/17 0331  WBC 8.2 7.5 8.1  RBC 5.13 4.98 5.04  HGB 11.4* 11.4* 11.3*  HCT 41.6 39.9 40.2  MCV 81.1 80.1 79.8*  MCH 22.2* 22.9* 22.4*  MCHC 27.4* 28.6* 28.1*  RDW 15.9* 15.9* 16.0*  PLT 362 309 345    Cardiac Enzymes Recent Labs  Lab 12/09/17 1759 12/10/17 0009 12/10/17 0530  TROPONINI <0.03 <0.03 <0.03    Recent Labs  Lab 12/09/17 1442  TROPIPOC 0.00  BNPNo results for input(s): BNP, PROBNP in the last 168 hours.   DDimer No results for input(s): DDIMER in the last 168 hours.   Radiology    Dg Chest 2 View  Result Date: 12/09/2017 CLINICAL DATA:  Chest pain and dyspnea for 10 minutes today. EXAM: CHEST - 2 VIEW COMPARISON:  10/09/2017 CXR, chest CT 11/13/2017 FINDINGS: Status post CABG. Normal heart and mediastinal contours. Clear lungs. No effusion or pneumothorax. No acute osseous abnormality. IMPRESSION: No active cardiopulmonary disease. Electronically Signed   By: Ashley Royalty M.D.   On: 12/09/2017 15:54    Cardiac Studies   Coronary angiogram showed no progressive CAD.  Patient Profile   Assessment & Plan   Principal Problem:   Chest pain Active Problems:   PAF (paroxysmal atrial fibrillation) (HCC)   Dyslipidemia   Unstable angina - Post Infarct Angina with existing LAD disease   CAD S/P percutaneous coronary angioplasty - DES to Mid LAD; 2x 28mm x 38 mm Xience DES to RCA   OSA (obstructive sleep apnea)   Persistent atrial fibrillation    S/P CABG x 3   Type 2 diabetes mellitus without complication, without long-term current use of insulin (HCC)   History of DVT (deep vein thrombosis)   No changes to medications required at this time. He will f/u with [name] on 12/18/17. We discussed the addition of Imdur or amlodipine for antianginal therapy and participated in shared decision making. We agreed that this can be discussed at his follow up appt. He has recently had an afib ablation, and would like a little more time to recover from this procedure to determine if he has any additional episodes of CP. He knows to take nitroglycerin for CP and to come to the ED with unresolved symptoms.   [facility] will sign off.   Medication Recommendations: no change. Resume home meds. Other recommendations (labs, testing, etc):  none Follow up as an outpatient:  12/18/17  For questions or updates, please contact [place] Please consult [email or web address] for contact info under      Signed, Elouise Munroe, MD  12/11/2017, 8:48 AM

## 2017-12-11 NOTE — Discharge Instructions (Signed)
Call [facility] at [phone] if any bleeding, swelling or drainage at cath site.  May shower, no tub baths for 48 hours for groin sticks. No lifting over 5 pounds for 3 days.  No Driving for 3 days

## 2017-12-14 ENCOUNTER — Ambulatory Visit (HOSPITAL_COMMUNITY)
Admission: RE | Admit: 2017-12-14 | Discharge: 2017-12-14 | Disposition: A | Discharge status: Home / Self Care | Payer: BLUE CROSS/BLUE SHIELD | Source: Ambulatory Visit | Attending: Nurse Practitioner | Admitting: Nurse Practitioner

## 2017-12-14 ENCOUNTER — Encounter (HOSPITAL_COMMUNITY): Payer: BLUE CROSS/BLUE SHIELD

## 2017-12-14 ENCOUNTER — Encounter (HOSPITAL_COMMUNITY): Payer: Self-pay | Admitting: Nurse Practitioner

## 2017-12-14 VITALS — BP 118/72 | HR 67 | Ht 67.0 in | Wt 241.2 lb

## 2017-12-14 DIAGNOSIS — Z951 Presence of aortocoronary bypass graft: Secondary | ICD-10-CM | POA: Diagnosis not present

## 2017-12-14 DIAGNOSIS — G4733 Obstructive sleep apnea (adult) (pediatric): Secondary | ICD-10-CM | POA: Diagnosis not present

## 2017-12-14 DIAGNOSIS — I4819 Other persistent atrial fibrillation: Secondary | ICD-10-CM

## 2017-12-14 DIAGNOSIS — E119 Type 2 diabetes mellitus without complications: Secondary | ICD-10-CM | POA: Insufficient documentation

## 2017-12-14 DIAGNOSIS — I480 Paroxysmal atrial fibrillation: Secondary | ICD-10-CM | POA: Insufficient documentation

## 2017-12-14 DIAGNOSIS — Z7901 Long term (current) use of anticoagulants: Secondary | ICD-10-CM | POA: Insufficient documentation

## 2017-12-14 DIAGNOSIS — E785 Hyperlipidemia, unspecified: Secondary | ICD-10-CM | POA: Diagnosis not present

## 2017-12-14 DIAGNOSIS — Z79899 Other long term (current) drug therapy: Secondary | ICD-10-CM | POA: Insufficient documentation

## 2017-12-14 DIAGNOSIS — I2510 Atherosclerotic heart disease of native coronary artery without angina pectoris: Secondary | ICD-10-CM | POA: Insufficient documentation

## 2017-12-14 DIAGNOSIS — Z87891 Personal history of nicotine dependence: Secondary | ICD-10-CM | POA: Diagnosis not present

## 2017-12-14 DIAGNOSIS — I10 Essential (primary) hypertension: Secondary | ICD-10-CM | POA: Diagnosis not present

## 2017-12-14 DIAGNOSIS — Z8249 Family history of ischemic heart disease and other diseases of the circulatory system: Secondary | ICD-10-CM | POA: Diagnosis not present

## 2017-12-14 DIAGNOSIS — Z955 Presence of coronary angioplasty implant and graft: Secondary | ICD-10-CM | POA: Diagnosis not present

## 2017-12-14 DIAGNOSIS — I252 Old myocardial infarction: Secondary | ICD-10-CM | POA: Insufficient documentation

## 2017-12-14 DIAGNOSIS — Z7982 Long term (current) use of aspirin: Secondary | ICD-10-CM | POA: Insufficient documentation

## 2017-12-14 NOTE — Progress Notes (Signed)
Primary Care Physician: Everardo Beals, NP Referring Physician:Dr. Foye Clock is a 57 y.o. male with a h/o afib, CAD, that is in the afib clinic f/u ablation one month ago. A couple of weeks after the ablation, he developed chest pain, he was hospitalized and had a LHC, which was stable. He had CABG x 3 on 08/24/2017. No further chest pain.No afib. He has f/u with [name] 12/13 for this episode. He was on heparin while waiting on  cath and then resumed xarelto, so no interruption of anticoagulation. No afib awareness since the procedure, no swallowing or groin issues.  Today, he denies symptoms of palpitations, chest pain, shortness of breath, orthopnea, PND, lower extremity edema, dizziness, presyncope, syncope, or neurologic sequela. The patient is tolerating medications without difficulties and is otherwise without complaint today.   Past Medical History:  Diagnosis Date  . Arthritis       . Atrial flutter ([place])   . CAD (coronary artery disease)    multivessel per cath 02/12/13, s/p stenting to RCA  . Chronic lower back pain   . Diabetes mellitus without complication ([place])   . Dyslipidemia   . History of kidney stones   . Hx of Bell's palsy   . Hypertension   . Myocardial infarction ([place]) 02/12/2013   . Nephrolithiasis   . Neuromuscular disorder ([facility])    Bell's Palsy  . OSA on CPAP   . PE (pulmonary thromboembolism) ([place]) 02/2014  . Persistent atrial fibrillation        Past Surgical History:  Procedure Laterality Date  . ABDOMINAL AORTOGRAM W/LOWER EXTREMITY N/A 01/26/2017   Procedure: ABDOMINAL AORTOGRAM W/LOWER EXTREMITY;  Surgeon: Angelia Mould, MD;  Location: [place] [facility];  Service: Cardiovascular;  Laterality: N/A;  . ATRIAL FIBRILLATION ABLATION  11/19/2017  . ATRIAL FIBRILLATION ABLATION N/A 11/19/2017   Procedure: ATRIAL FIBRILLATION ABLATION;  Surgeon: Thompson Grayer, MD;  Location: [place] [facility];  Service: Cardiovascular;   Laterality: N/A;  . BACK SURGERY  2017   laminectomy  . CARDIOVERSION N/A 02/15/2013   Procedure: CARDIOVERSION;  Surgeon: Thayer Headings, MD;  Location: [facility];  Service: Cardiovascular;  Laterality: N/A;  . CARDIOVERSION N/A 07/07/2014   Procedure: CARDIOVERSION;  Surgeon: Pixie Casino, MD;  Location: [facility] ENDOSCOPY;  Service: Cardiovascular;  Laterality: N/A;  . CARDIOVERSION N/A 08/31/2017   Procedure: CARDIOVERSION;  Surgeon: Dorothy Spark, MD;  Location: [facility] ENDOSCOPY;  Service: Cardiovascular;  Laterality: N/A;  . CARDIOVERSION N/A 09/29/2017   Procedure: CARDIOVERSION;  Surgeon: Sanda Klein, MD;  Location: [place];  Service: Cardiovascular;  Laterality: N/A;  . CORONARY ANGIOPLASTY WITH STENT PLACEMENT  02/12/2013  . CORONARY ARTERY BYPASS GRAFT N/A 08/24/2017   Procedure: CORONARY ARTERY BYPASS GRAFTING (CABG) X3. RIGHT ENDOSCOPIC SAPHENOUS VEIN HARVEST. MAMM TO LAD, SVG TO OM, SVG TO RAMUS;  Surgeon: Ivin Poot, MD;  Location: [place];  Service: Open Heart Surgery;  Laterality: N/A;  . CYSTOSCOPY WITH RETROGRADE PYELOGRAM, URETEROSCOPY AND STENT PLACEMENT Left 09/02/2013   Procedure: CYSTOSCOPY WITH RETROGRADE PYELOGRAM/LEFT URETEROSCOPY/URETERAL STENT, with fulguration;  Surgeon: Festus Aloe, MD;  Location: [facility];  Service: Urology;  Laterality: Left;  . CYSTOSCOPY WITH URETEROSCOPY AND STENT PLACEMENT Left 09/16/2013   Procedure: CYSTOSCOPY WITH URETEROSCOPY AND STENT PLACEMENT;  Surgeon: Festus Aloe, MD;  Location: [facility];  Service: Urology;  Laterality: Left;  . FEMORAL BYPASS Left 04/07/2017   below the knee popliteal  . FEMORAL-POPLITEAL BYPASS GRAFT Left  04/07/2017   Procedure: LEFT FEMORAL-BELOW KNEE POPLITEAL ARTERY BYPASS USING COMPOSITE LEFT GREATER SAPHENOUS VEIN;  Surgeon: Angelia Mould, MD;  Location: [place];  Service: Vascular;  Laterality: Left;  . HOLMIUM LASER APPLICATION Left 4/00/8676   Procedure: HOLMIUM LASER APPLICATION;   Surgeon: Festus Aloe, MD;  Location: [facility];  Service: Urology;  Laterality: Left;  . INTRAVASCULAR PRESSURE WIRE/FFR STUDY N/A 08/21/2017   Procedure: INTRAVASCULAR PRESSURE WIRE/FFR STUDY;  Surgeon: Sherren Mocha, MD;  Location: [place] [facility];  Service: Cardiovascular;  Laterality: N/A;  . LEFT HEART CATH AND CORONARY ANGIOGRAPHY N/A 08/21/2017   Procedure: LEFT HEART CATH AND CORONARY ANGIOGRAPHY;  Surgeon: Sherren Mocha, MD;  Location: [place] [facility];  Service: Cardiovascular;  Laterality: N/A;  . LEFT HEART CATH AND CORS/GRAFTS ANGIOGRAPHY N/A 12/10/2017   Procedure: LEFT HEART CATH AND CORS/GRAFTS ANGIOGRAPHY;  Surgeon: Troy Sine, MD;  Location: [place] [facility];  Service: Cardiovascular;  Laterality: N/A;  . LEFT HEART CATHETERIZATION WITH CORONARY ANGIOGRAM N/A 02/12/2013   Procedure: LEFT HEART CATHETERIZATION WITH CORONARY ANGIOGRAM;  Surgeon: Troy Sine, MD;  Location[facility] CATH LAB;  Service: Cardiovascular;  Laterality: N/A;  . PERCUTANEOUS CORONARY STENT INTERVENTION (PCI-S)  02/12/2013   Procedure: PERCUTANEOUS CORONARY STENT INTERVENTION (PCI-S);  Surgeon: Troy Sine, MD;  Location: [facility] CATH LAB;  Service: Cardiovascular;;  STEMI  . PERCUTANEOUS CORONARY STENT INTERVENTION (PCI-S) N/A 02/14/2013   Procedure: PERCUTANEOUS CORONARY STENT INTERVENTION (PCI-S);  Surgeon: Burnell Blanks, MD;  Location: [facility] CATH LAB;  Service: Cardiovascular;  Laterality: N/A;  Proximal LAD  . TEE WITHOUT CARDIOVERSION N/A 02/15/2013   Procedure: TRANSESOPHAGEAL ECHOCARDIOGRAM (TEE);  Surgeon: Thayer Headings, MD;  Location: [place];  Service: Cardiovascular;  Laterality: N/A;  . TEE WITHOUT CARDIOVERSION N/A 08/24/2017   Procedure: TRANSESOPHAGEAL ECHOCARDIOGRAM (TEE);  Surgeon: Prescott Gum, Collier Salina, MD;  Location: [place];  Service: Open Heart Surgery;  Laterality: N/A;  . TEE WITHOUT CARDIOVERSION N/A 08/31/2017   Procedure: TRANSESOPHAGEAL ECHOCARDIOGRAM (TEE);  Surgeon:  Dorothy Spark, MD;  Location: [facility] ENDOSCOPY;  Service: Cardiovascular;  Laterality: N/A;    Current Outpatient Medications  Medication Sig Dispense Refill  . ACCU-CHEK AVIVA PLUS test strip USE UP TO 4 TIMES DAILY AS DIRECTED 120 each 0  . ACCU-CHEK SOFTCLIX LANCETS lancets USE UP TO 4 TIMES DAILY AS DIRECTED 100 each 0  . acetaminophen (TYLENOL) 650 MG CR tablet Take 1,300 mg by mouth every 8 (eight) hours as needed for pain.     Marland Kitchen amiodarone (PACERONE) 200 MG tablet Take 1 tablet (200 mg total) by mouth daily. 90 tablet 3  . aspirin EC 81 MG tablet Take 81 mg by mouth daily.    Marland Kitchen atorvastatin (LIPITOR) 80 MG tablet Take 80 mg by mouth daily.    . blood glucose meter kit and supplies KIT Dispense based on patient and insurance preference. Use up to four times daily as directed. (FOR ICD-9 250.00, 250.01). 1 each 0  . docusate sodium (COLACE) 100 MG capsule Take 100 mg by mouth 2 (two) times daily.   5  . DULoxetine (CYMBALTA) 60 MG capsule Take 60 mg by mouth every evening.    Marland Kitchen JARDIANCE 10 MG TABS tablet Take 10 mg by mouth daily.  3  . magnesium oxide (MAG-OX) 400 (241.3 MG) MG tablet Take 1 tablet (400 mg total) by mouth daily. (Patient taking differently: Take 250 mg by mouth daily. ) 30 tablet 11  . metoprolol tartrate (LOPRESSOR)  25 MG tablet Take 1 tablet (25 mg total) by mouth 2 (two) times daily. 60 tablet 6  . Multiple Vitamin (MULTIVITAMIN WITH MINERALS) TABS tablet Take 1 tablet by mouth daily.     . NON FORMULARY at bedtime. CPAP    . pantoprazole (PROTONIX) 40 MG tablet Take 1 tablet (40 mg total) by mouth daily. 90 tablet 1  . Potassium 99 MG TABS Take 99 mg by mouth daily.     Alveda Reasons 20 MG TABS tablet TAKE 1 TABLET (20 MG TOTAL) DAILY WITH SUPPER BY MOUTH. (Patient taking differently: Take 20 mg by mouth daily with supper. ) 90 tablet 1  . nitroGLYCERIN (NITROSTAT) 0.4 MG SL tablet PLACE 1 TABLET UNDER TONGUE EVERY 5 MINUTES AS NEEDED FOR CHEST PAIN MAX 3 TABLETS  (Patient not taking: No sig reported) 25 tablet 3   No current facility-administered medications for this encounter.     No Known Allergies  Social History   Socioeconomic History  . Marital status: Married    Spouse name: Not on file  . Number of children: Not on file  . Years of education: Not on file  . Highest education level: Not on file  Occupational History  . Not on file  Social Needs  . Financial resource strain: Not on file  . Food insecurity:    Worry: Not on file    Inability: Not on file  . Transportation needs:    Medical: Not on file    Non-medical: Not on file  Tobacco Use  . Smoking status: Former Smoker    Packs/day: 0.50    Years: 35.00    Pack years: 17.50    Types: Cigarettes    Last attempt to quit: 02/12/2013    Years since quitting: 4.8  . Smokeless tobacco: Never Used  Substance and Sexual Activity  . Alcohol use: No    Comment: 07/21/2014 "never drank much; might have a couple drinks/yr since MI 02/2013"  . Drug use: No  . Sexual activity: Yes    Birth control/protection: None  Lifestyle  . Physical activity:    Days per week: Not on file    Minutes per session: Not on file  . Stress: Not on file  Relationships  . Social connections:    Talks on phone: Not on file    Gets together: Not on file    Attends religious service: Not on file    Active member of club or organization: Not on file    Attends meetings of clubs or organizations: Not on file    Relationship status: Not on file  . Intimate partner violence:    Fear of current or ex partner: Not on file    Emotionally abused: Not on file    Physically abused: Not on file    Forced sexual activity: Not on file  Other Topics Concern  . Not on file  Social History Narrative  . Not on file    Family History  Problem Relation Age of Onset  . Heart disease Mother   . Diabetes Mother   . Stroke Mother   . Diabetes Father     ROS- All systems are reviewed and negative except as  per the HPI above  Physical Exam: Vitals:   12/14/17 0901  BP: 118/72  Pulse: 67  Weight: 109.4 kg  Height: '5\' 7"'$  (1.702 m)   Wt Readings from Last 3 Encounters:  12/14/17 109.4 kg  12/11/17 106.6 kg  11/20/17 110  kg    Labs: Lab Results  Component Value Date   NA 140 12/11/2017   K 4.2 12/11/2017   CL 108 12/11/2017   CO2 23 12/11/2017   GLUCOSE 103 (H) 12/11/2017   BUN 12 12/11/2017   CREATININE 0.84 12/11/2017   CALCIUM 8.7 (L) 12/11/2017   MG 1.9 12/10/2017   Lab Results  Component Value Date   INR 1.27 08/24/2017   Lab Results  Component Value Date   CHOL 88 08/29/2017   HDL 23 (L) 08/29/2017   LDLCALC 41 08/29/2017   TRIG 118 08/29/2017     GEN- The patient is well appearing, alert and oriented x 3 today.   Head- normocephalic, atraumatic Eyes-  Sclera clear, conjunctiva pink Ears- hearing intact Oropharynx- clear Neck- supple, no JVP Lymph- no cervical lymphadenopathy Lungs- Clear to ausculation bilaterally, normal work of breathing Heart- Regular rate and rhythm, no murmurs, rubs or gallops, PMI not laterally displaced GI- soft, NT, ND, + BS Extremities- no clubbing, cyanosis, or edema MS- no significant deformity or atrophy Skin- no rash or lesion Psych- euthymic mood, full affect Neuro- strength and sensation are intact  EKG-NSR at 67 bpm, PR int 206 ms, qrs int 86 ms, qtc 437 ms Epic records reviewed    Assessment and Plan: 1. Paroxysmal afib Doing well s/p ablation  No swallowing or groin issues Continue with amiodarone 200 mg daily, most likely this will be discontinued if he continues in SR when he has f/u with Dr. Rayann Heman Continue xarelto 20 mg daily for chadsvasc score of 4  2. CAD s/p bypass Recent angina but stable LHC Has f/u with [name] 12/13  F/u with Dr. Rayann Heman 2/19  Geroge Baseman. Dawsen Krieger, [facility] [address] [place], [place] [address] [phone]

## 2017-12-15 ENCOUNTER — Encounter (HOSPITAL_COMMUNITY): Payer: Self-pay | Admitting: *Deleted

## 2017-12-15 DIAGNOSIS — Z951 Presence of aortocoronary bypass graft: Secondary | ICD-10-CM

## 2017-12-15 NOTE — Progress Notes (Signed)
Cardiac Individual Treatment Plan  Patient Details  Name: Paul Todd MRN: [id number] Date of Birth: 07/22/1960 Referring Provider:     CARDIAC REHAB PHASE II ORIENTATION from 11/10/2017 in [place]  Referring Provider  Pixie Casino MD       Initial Encounter Date:    CARDIAC REHAB PHASE II ORIENTATION from 11/10/2017 in [place]  Date  11/10/17      Visit Diagnosis: 08/24/2017 S/P CABG (coronary artery bypass graft)  Patient's Home Medications on Admission:  Current Outpatient Medications:  .  ACCU-CHEK AVIVA PLUS test strip, USE UP TO 4 TIMES DAILY AS DIRECTED, Disp: 120 each, Rfl: 0 .  ACCU-CHEK SOFTCLIX LANCETS lancets, USE UP TO 4 TIMES DAILY AS DIRECTED, Disp: 100 each, Rfl: 0 .  acetaminophen (TYLENOL) 650 MG CR tablet, Take 1,300 mg by mouth every 8 (eight) hours as needed for pain. , Disp: , Rfl:  .  amiodarone (PACERONE) 200 MG tablet, Take 1 tablet (200 mg total) by mouth daily., Disp: 90 tablet, Rfl: 3 .  aspirin EC 81 MG tablet, Take 81 mg by mouth daily., Disp: , Rfl:  .  atorvastatin (LIPITOR) 80 MG tablet, Take 80 mg by mouth daily., Disp: , Rfl:  .  blood glucose meter kit and supplies KIT, Dispense based on patient and insurance preference. Use up to four times daily as directed. (FOR ICD-9 250.00, 250.01)., Disp: 1 each, Rfl: 0 .  docusate sodium (COLACE) 100 MG capsule, Take 100 mg by mouth 2 (two) times daily. , Disp: , Rfl: 5 .  DULoxetine (CYMBALTA) 60 MG capsule, Take 60 mg by mouth every evening., Disp: , Rfl:  .  JARDIANCE 10 MG TABS tablet, Take 10 mg by mouth daily., Disp: , Rfl: 3 .  magnesium oxide (MAG-OX) 400 (241.3 MG) MG tablet, Take 1 tablet (400 mg total) by mouth daily. (Patient taking differently: Take 250 mg by mouth daily. ), Disp: 30 tablet, Rfl: 11 .  metoprolol tartrate (LOPRESSOR) 25 MG tablet, Take 1 tablet (25 mg total) by mouth 2 (two) times daily., Disp: 60  tablet, Rfl: 6 .  Multiple Vitamin (MULTIVITAMIN WITH MINERALS) TABS tablet, Take 1 tablet by mouth daily. , Disp: , Rfl:  .  nitroGLYCERIN (NITROSTAT) 0.4 MG SL tablet, PLACE 1 TABLET UNDER TONGUE EVERY 5 MINUTES AS NEEDED FOR CHEST PAIN MAX 3 TABLETS (Patient not taking: No sig reported), Disp: 25 tablet, Rfl: 3 .  NON FORMULARY, at bedtime. CPAP, Disp: , Rfl:  .  pantoprazole (PROTONIX) 40 MG tablet, Take 1 tablet (40 mg total) by mouth daily., Disp: 90 tablet, Rfl: 1 .  Potassium 99 MG TABS, Take 99 mg by mouth daily. , Disp: , Rfl:  .  XARELTO 20 MG TABS tablet, TAKE 1 TABLET (20 MG TOTAL) DAILY WITH SUPPER BY MOUTH. (Patient taking differently: Take 20 mg by mouth daily with supper. ), Disp: 90 tablet, Rfl: 1  Past Medical History: Past Medical History:  Diagnosis Date  . Arthritis       . Atrial flutter ([place])   . CAD (coronary artery disease)    multivessel per cath 02/12/13, s/p stenting to RCA  . Chronic lower back pain   . Diabetes mellitus without complication ([place])   . Dyslipidemia   . History of kidney stones   . Hx of Bell's palsy   . Hypertension   . Myocardial infarction ([place]) 02/12/2013   . Nephrolithiasis   .  Neuromuscular disorder ([facility])    Bell's Palsy  . OSA on CPAP   . PE (pulmonary thromboembolism) ([place]) 02/2014  . Persistent atrial fibrillation         Tobacco Use: Social History   Tobacco Use  Smoking Status Former Smoker  . Packs/day: 0.50  . Years: 35.00  . Pack years: 17.50  . Types: Cigarettes  . Last attempt to quit: 02/12/2013  . Years since quitting: 4.8  Smokeless Tobacco Never Used    Labs: Recent Review Flowsheet Data    Labs for ITP Cardiac and Pulmonary Rehab Latest Ref Rng & Units 08/25/2017 08/25/2017 08/26/2017 08/26/2017 08/29/2017   Cholestrol 0 - 200 mg/dL - - - - 88   LDLCALC 0 - 99 mg/dL - - - - 41   HDL >40 mg/dL - - - - 23(L)   Trlycerides <150 mg/dL - - - - 118   Hemoglobin A1c 4.8 - 5.6 % - - - - -   PHART 7.350 - 7.450 7.387 -  - 7.421 -   PCO2ART 32.0 - 48.0 mmHg 37.2 - - 39.7 -   HCO3 20.0 - 28.0 mmol/L 22.4 - - 25.8 -   TCO2 22 - 32 mmol/L 24 24 - 27 -   ACIDBASEDEF 0.0 - 2.0 mmol/L 2.0 - - - -   O2SAT % 90.0 - 69.2 99.0 -      Capillary Blood Glucose: Lab Results  Component Value Date   GLUCAP 100 (H) 12/11/2017   GLUCAP 146 (H) 12/10/2017   GLUCAP 78 12/10/2017   GLUCAP 92 12/10/2017   GLUCAP 113 (H) 12/10/2017     Exercise Target Goals: Exercise Program Goal: Individual exercise prescription set using results from initial 6 min walk test and THRR while considering  patient's activity barriers and safety.   Exercise Prescription Goal: Initial exercise prescription builds to 30-45 minutes a day of aerobic activity, 2-3 days per week.  Home exercise guidelines will be given to patient during program as part of exercise prescription that the participant will acknowledge.  Activity Barriers & Risk Stratification: Activity Barriers & Cardiac Risk Stratification - 11/10/17 1032      Activity Barriers & Cardiac Risk Stratification   Activity Barriers  Incisional Pain;Back Problems;Other (comment)    Comments  PAD: Claudication Pain     Cardiac Risk Stratification  High       6 Minute Walk: 6 Minute Walk    Row Name 11/10/17 1028         6 Minute Walk   Distance  1200 feet     Walk Time  5.25 minutes     # of Rest Breaks  1     MPH  2.27     METS  2.88     RPE  13     Perceived Dyspnea   0     VO2 Peak  10.1     Symptoms  Yes (comment)     Comments  9/10 Claudication Pain (right leg). Stopped test at 5:25     Resting HR  75 bpm     Resting BP  114/70     Resting Oxygen Saturation   97 %     Exercise Oxygen Saturation  during 6 min walk  98 %     Max Ex. HR  114 bpm     Max Ex. BP  138/80     2 Minute Post BP  112/70        Oxygen  Initial Assessment:   Oxygen Re-Evaluation:   Oxygen Discharge (Final Oxygen Re-Evaluation):   Initial Exercise Prescription: Initial Exercise  Prescription - 11/10/17 1000      Date of Initial Exercise RX and Referring Provider   Date  11/10/17    Referring Provider  Pixie Casino MD     Expected Discharge Date  02/15/18      Recumbant Bike   Level  1.5    Watts  30    Minutes  10    METs  2.86      NuStep   Level  2    SPM  75    Minutes  10    METs  2.5      Track   Laps  8    Minutes  10    METs  2.38      Prescription Details   Frequency (times per week)  3x    Duration  Progress to 30 minutes of continuous aerobic without signs/symptoms of physical distress      Intensity   THRR 40-80% of Max Heartrate  65-130    Ratings of Perceived Exertion  11-13    Perceived Dyspnea  0-4      Progression   Progression  Continue progressive overload as per policy without signs/symptoms or physical distress.      Resistance Training   Training Prescription  Yes    Weight  3lbs    Reps  10-15       Perform Capillary Blood Glucose checks as needed.  Exercise Prescription Changes:  Exercise Prescription Changes    Row Name 11/30/17 1001 12/07/17 0957           Response to Exercise   Blood Pressure (Admit)  138/80  140/70      Blood Pressure (Exercise)  162/82  160/72      Blood Pressure (Exit)  108/72  146/88      Heart Rate (Admit)  66 bpm  87 bpm      Heart Rate (Exercise)  82 bpm  98 bpm      Heart Rate (Exit)  65 bpm  85 bpm      Rating of Perceived Exertion (Exercise)  11  11      Symptoms  none  none      Duration  Progress to 30 minutes of  aerobic without signs/symptoms of physical distress  Progress to 30 minutes of  aerobic without signs/symptoms of physical distress      Intensity  THRR unchanged  THRR unchanged        Progression   Progression  Continue to progress workloads to maintain intensity without signs/symptoms of physical distress.  Continue to progress workloads to maintain intensity without signs/symptoms of physical distress.      Average METs  2.2  2.2        Resistance  Training   Training Prescription  Yes  Yes      Weight  3lbs  5lbs      Reps  10-15  10-15      Time  10 Minutes  10 Minutes        Interval Training   Interval Training  No  No        Recumbant Bike   Level  1.5  2      Minutes  10  10      METs  2  2.2        NuStep  Level  2  2      SPM  75  75      Minutes  10  10      METs  2.6  2.1        Track   Laps  6  8      Minutes  10  10      METs  2.03  2.39         Exercise Comments:  Exercise Comments    Row Name 11/30/17 1055 12/07/17 1034         Exercise Comments  Patien tolerated 1st session of exercise well, will increase workloads as tolerated.  Reviewed METs with patient.         Exercise Goals and Review:  Exercise Goals    Row Name 11/10/17 1032             Exercise Goals   Increase Physical Activity  Yes       Intervention  Provide advice, education, support and counseling about physical activity/exercise needs.;Develop an individualized exercise prescription for aerobic and resistive training based on initial evaluation findings, risk stratification, comorbidities and participant's personal goals.       Expected Outcomes  Short Term: Attend rehab on a regular basis to increase amount of physical activity.;Long Term: Add in home exercise to make exercise part of routine and to increase amount of physical activity.;Long Term: Exercising regularly at least 3-5 days a week.       Increase Strength and Stamina  Yes       Intervention  Develop an individualized exercise prescription for aerobic and resistive training based on initial evaluation findings, risk stratification, comorbidities and participant's personal goals.;Provide advice, education, support and counseling about physical activity/exercise needs.       Expected Outcomes  Short Term: Increase workloads from initial exercise prescription for resistance, speed, and METs.;Short Term: Perform resistance training exercises routinely during rehab and  add in resistance training at home;Long Term: Improve cardiorespiratory fitness, muscular endurance and strength as measured by increased METs and functional capacity (6MWT)       Able to understand and use rate of perceived exertion (RPE) scale  Yes       Intervention  Provide education and explanation on how to use RPE scale       Expected Outcomes  Short Term: Able to use RPE daily in rehab to express subjective intensity level;Long Term:  Able to use RPE to guide intensity level when exercising independently       Knowledge and understanding of Target Heart Rate Range (THRR)  Yes       Intervention  Provide education and explanation of THRR including how the numbers were predicted and where they are located for reference       Expected Outcomes  Short Term: Able to state/look up THRR;Long Term: Able to use THRR to govern intensity when exercising independently;Short Term: Able to use daily as guideline for intensity in rehab       Able to check pulse independently  Yes       Intervention  Provide education and demonstration on how to check pulse in carotid and radial arteries.;Review the importance of being able to check your own pulse for safety during independent exercise       Expected Outcomes  Short Term: Able to explain why pulse checking is important during independent exercise;Long Term: Able to check pulse independently and accurately       Understanding  of Exercise Prescription  Yes       Intervention  Provide education, explanation, and written materials on patient's individual exercise prescription       Expected Outcomes  Short Term: Able to explain program exercise prescription;Long Term: Able to explain home exercise prescription to exercise independently          Exercise Goals Re-Evaluation : Exercise Goals Re-Evaluation    Row Name 11/30/17 1055             Exercise Goal Re-Evaluation   Exercise Goals Review  Increase Physical Activity;Able to understand and use rate of  perceived exertion (RPE) scale       Comments  Patient able to understand and use RPE scale appropriately.       Expected Outcomes  Increase workloads as tolerated to help improve cardiorespiratory fitness. Increase walking duration to be able to tolerate PAD pain better.          Discharge Exercise Prescription (Final Exercise Prescription Changes): Exercise Prescription Changes - 12/07/17 0957      Response to Exercise   Blood Pressure (Admit)  140/70    Blood Pressure (Exercise)  160/72    Blood Pressure (Exit)  146/88    Heart Rate (Admit)  87 bpm    Heart Rate (Exercise)  98 bpm    Heart Rate (Exit)  85 bpm    Rating of Perceived Exertion (Exercise)  11    Symptoms  none    Duration  Progress to 30 minutes of  aerobic without signs/symptoms of physical distress    Intensity  THRR unchanged      Progression   Progression  Continue to progress workloads to maintain intensity without signs/symptoms of physical distress.    Average METs  2.2      Resistance Training   Training Prescription  Yes    Weight  5lbs    Reps  10-15    Time  10 Minutes      Interval Training   Interval Training  No      Recumbant Bike   Level  2    Minutes  10    METs  2.2      NuStep   Level  2    SPM  75    Minutes  10    METs  2.1      Track   Laps  8    Minutes  10    METs  2.39       Nutrition:  Target Goals: Understanding of nutrition guidelines, daily intake of sodium <1529m, cholesterol <2083m calories 30% from fat and 7% or less from saturated fats, daily to have 5 or more servings of fruits and vegetables.  Biometrics: Pre Biometrics - 11/10/17 1031      Pre Biometrics   Height  5' 6.25" (1.683 m)    Weight  246 lb 14.6 oz (112 kg)    Waist Circumference  46 inches    Hip Circumference  49 inches    Waist to Hip Ratio  0.94 %    BMI (Calculated)  39.54    Triceps Skinfold  32 mm    % Body Fat  38 %    Grip Strength  38 kg    Flexibility  0 in    Single Leg  Stand  18.31 seconds        Nutrition Therapy Plan and Nutrition Goals: Nutrition Therapy & Goals - 11/10/17 [id number]  Nutrition Therapy   Diet  heart healthy, carb modified      Personal Nutrition Goals   Nutrition Goal  Pt to identify and limit food sources of saturated fat, trans fat, refined carbohydrates and sodium    Personal Goal #2  Pt to identify food quantities necessary to achieve weight loss of 6-24 lbs. at graduation from cardiac rehab    Personal Goal #3  Improved blood glucose control as evidenced by pt's A1c trending from 7.4 toward less than 7.0.    Personal Goal #4  Pt able to name foods that affect blood glucose.      Intervention Plan   Intervention  Prescribe, educate and counsel regarding individualized specific dietary modifications aiming towards targeted core components such as weight, hypertension, lipid management, diabetes, heart failure and other comorbidities.    Expected Outcomes  Short Term Goal: Understand basic principles of dietary content, such as calories, fat, sodium, cholesterol and nutrients.;Long Term Goal: Adherence to prescribed nutrition plan.       Nutrition Assessments: Nutrition Assessments - 11/10/17 0859      MEDFICTS Scores   Pre Score  46       Nutrition Goals Re-Evaluation:   Nutrition Goals Re-Evaluation:   Nutrition Goals Discharge (Final Nutrition Goals Re-Evaluation):   Psychosocial: Target Goals: Acknowledge presence or absence of significant depression and/or stress, maximize coping skills, provide positive support system. Participant is able to verbalize types and ability to use techniques and skills needed for reducing stress and depression.  Initial Review & Psychosocial Screening: Initial Psych Review & Screening - 11/10/17 0914      Initial Review   Current issues with  None Identified      Family Dynamics   Good Support System?  Yes   spouse, children       Quality of Life Scores: Quality of Life -  11/10/17 0915      Quality of Life   Select  Quality of Life      Quality of Life Scores   Health/Function Pre  17.6 %    Socioeconomic Pre  23.3 %    Psych/Spiritual Pre  22.1 %    Family Pre  24 %    GLOBAL Pre  20.7 %      Scores of 19 and below usually indicate a poorer quality of life in these areas.  A difference of  2-3 points is a clinically meaningful difference.  A difference of 2-3 points in the total score of the Quality of Life Index has been associated with significant improvement in overall quality of life, self-image, physical symptoms, and general health in studies assessing change in quality of life.  PHQ-9: Recent Review Flowsheet Data    Depression screen [facility] 2/9 11/30/2017 10/08/2017   Decreased Interest 0 0   Down, Depressed, Hopeless 0 0   PHQ - 2 Score 0 0     Interpretation of Total Score  Total Score Depression Severity:  1-4 = Minimal depression, 5-9 = Mild depression, 10-14 = Moderate depression, 15-19 = Moderately severe depression, 20-27 = Severe depression   Psychosocial Evaluation and Intervention:   Psychosocial Re-Evaluation: Psychosocial Re-Evaluation    [place] Name 12/15/17 1541             Psychosocial Re-Evaluation   Current issues with  None Identified       Comments  Jamael was recently hospitalized for chest pain and had a catherization       Interventions  Encouraged to  attend Cardiac Rehabilitation for the exercise       Continue Psychosocial Services   No Follow up required          Psychosocial Discharge (Final Psychosocial Re-Evaluation): Psychosocial Re-Evaluation - 12/15/17 1541      Psychosocial Re-Evaluation   Current issues with  None Identified    Comments  Krosby was recently hospitalized for chest pain and had a catherization    Interventions  Encouraged to attend Cardiac Rehabilitation for the exercise    Continue Psychosocial Services   No Follow up required       Vocational Rehabilitation: Provide vocational  rehab assistance to qualifying candidates.   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 Participant will state understanding/return demonstration of topics presented.  Learning Barriers/Preferences: Learning Barriers/Preferences - 11/10/17 1045      Learning Barriers/Preferences   Learning Barriers  Sight    Learning Preferences  Verbal Instruction;Skilled Demonstration;Written Material       Education Topics: Count Your Pulse:  -Group instruction provided by verbal instruction, demonstration, patient participation and written materials to support subject.  Instructors address importance of being able to find your pulse and how to count your pulse when at home without a heart monitor.  Patients get hands on experience counting their pulse with staff help and individually.   Heart Attack, Angina, and Risk Factor Modification:  -Group instruction provided by verbal instruction, video, and written materials to support subject.  Instructors address signs and symptoms of angina and heart attacks.    Also discuss risk factors for heart disease and how to make changes to improve heart health risk factors.   Functional Fitness:  -Group instruction provided by verbal instruction, demonstration, patient participation, and written materials to support subject.  Instructors address safety measures for doing things around the house.  Discuss how to get up and down off the floor, how to pick things up properly, how to safely get out of a chair without assistance, and balance training.   Meditation and Mindfulness:  -Group instruction provided by verbal instruction, patient participation, and written materials to  support subject.  Instructor addresses importance of mindfulness and meditation practice to help reduce stress and improve awareness.  Instructor also leads participants through a meditation exercise.    Stretching for Flexibility and Mobility:  -Group instruction provided by verbal instruction, patient participation, and written materials to support subject.  Instructors lead participants through series of stretches that are designed to increase flexibility thus improving mobility.  These stretches are additional exercise for major muscle groups that are typically performed during regular warm up and cool down.   Hands Only CPR:  -Group verbal, video, and participation provides a basic overview of AHA guidelines for community CPR. Role-play of emergencies allow participants the opportunity to practice calling for help and chest compression technique with discussion of AED use.   Hypertension: -Group verbal and written instruction that provides a basic overview of hypertension including the most recent diagnostic guidelines, risk factor reduction with self-care instructions and medication management.    Nutrition I class: Heart Healthy Eating:  -Group instruction provided by PowerPoint slides, verbal discussion, and written materials to support subject matter. The instructor gives an explanation and  review of the Therapeutic Lifestyle Changes diet recommendations, which includes a discussion on lipid goals, dietary fat, sodium, fiber, plant stanol/sterol esters, sugar, and the components of a well-balanced, healthy diet.   Nutrition II class: Lifestyle Skills:  -Group instruction provided by PowerPoint slides, verbal discussion, and written materials to support subject matter. The instructor gives an explanation and review of label reading, grocery shopping for heart health, heart healthy recipe modifications, and ways to make healthier choices when eating out.   Diabetes Question & Answer:   -Group instruction provided by PowerPoint slides, verbal discussion, and written materials to support subject matter. The instructor gives an explanation and review of diabetes co-morbidities, pre- and post-prandial blood glucose goals, pre-exercise blood glucose goals, signs, symptoms, and treatment of hypoglycemia and hyperglycemia, and foot care basics.   Diabetes Blitz:  -Group instruction provided by PowerPoint slides, verbal discussion, and written materials to support subject matter. The instructor gives an explanation and review of the physiology behind type 1 and type 2 diabetes, diabetes medications and rational behind using different medications, pre- and post-prandial blood glucose recommendations and Hemoglobin A1c goals, diabetes diet, and exercise including blood glucose guidelines for exercising safely.    Portion Distortion:  -Group instruction provided by PowerPoint slides, verbal discussion, written materials, and food models to support subject matter. The instructor gives an explanation of serving size versus portion size, changes in portions sizes over the last 20 years, and what consists of a serving from each food group.   Stress Management:  -Group instruction provided by verbal instruction, video, and written materials to support subject matter.  Instructors review role of stress in heart disease and how to cope with stress positively.     Exercising on Your Own:  -Group instruction provided by verbal instruction, power point, and written materials to support subject.  Instructors discuss benefits of exercise, components of exercise, frequency and intensity of exercise, and end points for exercise.  Also discuss use of nitroglycerin and activating EMS.  Review options of places to exercise outside of rehab.  Review guidelines for sex with heart disease.   Cardiac Drugs I:  -Group instruction provided by verbal instruction and written materials to support subject.   Instructor reviews cardiac drug classes: antiplatelets, anticoagulants, beta blockers, and statins.  Instructor discusses reasons, side effects, and lifestyle considerations for each drug class.   Cardiac Drugs II:  -Group instruction provided by verbal instruction and written materials to support subject.  Instructor reviews cardiac drug classes: angiotensin converting enzyme inhibitors (ACE-I), angiotensin II receptor blockers (ARBs), nitrates, and calcium channel blockers.  Instructor discusses reasons, side effects, and lifestyle considerations for each drug class.   Anatomy and Physiology of the Circulatory System:  Group verbal and written instruction and models provide basic cardiac anatomy and physiology, with the coronary electrical and arterial systems. Review of: AMI, Angina, Valve disease, Heart Failure, Peripheral Artery Disease, Cardiac Arrhythmia, Pacemakers, and the ICD.   Other Education:  -Group or individual verbal, written, or video instructions that support the educational goals of the cardiac rehab program.   Holiday Eating Survival Tips:  -Group instruction provided by PowerPoint slides, verbal discussion, and written materials to support subject matter. The instructor gives patients tips, tricks, and techniques to help them not only survive but enjoy the holidays despite the onslaught of food that accompanies the holidays.   Knowledge Questionnaire Score: Knowledge Questionnaire Score - 11/10/17 0914      Knowledge Questionnaire Score   Pre Score  23/24  Core Components/Risk Factors/Patient Goals at Admission: Personal Goals and Risk Factors at Admission - 11/10/17 1045      Core Components/Risk Factors/Patient Goals on Admission    Weight Management  Yes;Obesity;Weight Loss    Intervention  Weight Management: Develop a combined nutrition and exercise program designed to reach desired caloric intake, while maintaining appropriate intake of nutrient and  fiber, sodium and fats, and appropriate energy expenditure required for the weight goal.;Weight Management: Provide education and appropriate resources to help participant work on and attain dietary goals.;Weight Management/Obesity: Establish reasonable short term and long term weight goals.;Obesity: Provide education and appropriate resources to help participant work on and attain dietary goals.    Admit Weight  246 lb 14.6 oz (112 kg)    Goal Weight: Long Term  236 lb (107 kg)    Expected Outcomes  Short Term: Continue to assess and modify interventions until short term weight is achieved;Long Term: Adherence to nutrition and physical activity/exercise program aimed toward attainment of established weight goal;Weight Loss: Understanding of general recommendations for a balanced deficit meal plan, which promotes 1-2 lb weight loss per week and includes a negative energy balance of [phone] kcal/d;Understanding recommendations for meals to include 15-35% energy as protein, 25-35% energy from fat, 35-60% energy from carbohydrates, less than 222m of dietary cholesterol, 20-35 gm of total fiber daily;Understanding of distribution of calorie intake throughout the day with the consumption of 4-5 meals/snacks    Diabetes  Yes    Intervention  Provide education about signs/symptoms and action to take for hypo/hyperglycemia.;Provide education about proper nutrition, including hydration, and aerobic/resistive exercise prescription along with prescribed medications to achieve blood glucose in normal ranges: Fasting glucose 65-99 mg/dL    Expected Outcomes  Short Term: Participant verbalizes understanding of the signs/symptoms and immediate care of hyper/hypoglycemia, proper foot care and importance of medication, aerobic/resistive exercise and nutrition plan for blood glucose control.;Long Term: Attainment of HbA1C < 7%.    Hypertension  Yes    Intervention  Provide education on lifestyle modifcations including  regular physical activity/exercise, weight management, moderate sodium restriction and increased consumption of fresh fruit, vegetables, and low fat dairy, alcohol moderation, and smoking cessation.;Monitor prescription use compliance.    Expected Outcomes  Short Term: Continued assessment and intervention until BP is < 140/915mHG in hypertensive participants. < 130/8023mG in hypertensive participants with diabetes, heart failure or chronic kidney disease.;Long Term: Maintenance of blood pressure at goal levels.    Lipids  Yes    Intervention  Provide education and support for participant on nutrition & aerobic/resistive exercise along with prescribed medications to achieve LDL <62m40mDL >40mg54m Expected Outcomes  Short Term: Participant states understanding of desired cholesterol values and is compliant with medications prescribed. Participant is following exercise prescription and nutrition guidelines.;Long Term: Cholesterol controlled with medications as prescribed, with individualized exercise RX and with personalized nutrition plan. Value goals: LDL < 62mg,10m > 40 mg.       Core Components/Risk Factors/Patient Goals Review:  Goals and Risk Factor Review    Row Name 12/15/17 1543             Core Components/Risk Factors/Patient Goals Review   Personal Goals Review  Weight Management/Obesity;Lipids;Diabetes;Hypertension       Review  Treyvion hLoraseen doing well with exercise. [place] wPericlesecently hospitalized for chest pain. Einar's exercise is currently on hold       Expected Outcomes  Smith wByfordcontinue to participate in phase 2  cardiac rehab for exercise, nutrition and life style modification          Core Components/Risk Factors/Patient Goals at Discharge (Final Review):  Goals and Risk Factor Review - 12/15/17 1543      Core Components/Risk Factors/Patient Goals Review   Personal Goals Review  Weight Management/Obesity;Lipids;Diabetes;Hypertension    Review  Neythan had been doing  well with exercise. Andrian was recently hospitalized for chest pain. [facility] exercise is currently on hold    Expected Outcomes  Delford will continue to participate in phase 2 cardiac rehab for exercise, nutrition and life style modification       ITP Comments: ITP Comments    Row Name 11/10/17 0809 11/26/17 1119 12/15/17 1545       ITP Comments  Dr. Fransico Him, Medical Director   30 Day ITP Review. Camillo is scheduled to start exercise on 11/30/17 pending EP clearance  30 Day ITP Review. Jatavion is currently on hold for exercise pending clearance form Dr Debara Pickett         Comments: See ITP comments.Barnet Pall, RN,BSN 12/17/2017 10:51 AM

## 2017-12-16 ENCOUNTER — Encounter (HOSPITAL_COMMUNITY): Payer: BLUE CROSS/BLUE SHIELD

## 2017-12-18 ENCOUNTER — Encounter: Payer: Self-pay | Admitting: Internal Medicine

## 2017-12-18 ENCOUNTER — Ambulatory Visit (INDEPENDENT_AMBULATORY_CARE_PROVIDER_SITE_OTHER): Payer: BLUE CROSS/BLUE SHIELD | Admitting: Internal Medicine

## 2017-12-18 ENCOUNTER — Encounter (HOSPITAL_COMMUNITY): Payer: BLUE CROSS/BLUE SHIELD

## 2017-12-18 VITALS — BP 111/66 | HR 66 | Ht 67.0 in | Wt 240.6 lb

## 2017-12-18 DIAGNOSIS — I739 Peripheral vascular disease, unspecified: Secondary | ICD-10-CM

## 2017-12-18 DIAGNOSIS — E7800 Pure hypercholesterolemia, unspecified: Secondary | ICD-10-CM | POA: Diagnosis not present

## 2017-12-18 DIAGNOSIS — I4891 Unspecified atrial fibrillation: Secondary | ICD-10-CM

## 2017-12-18 DIAGNOSIS — Z951 Presence of aortocoronary bypass graft: Secondary | ICD-10-CM | POA: Diagnosis not present

## 2017-12-18 NOTE — Progress Notes (Signed)
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 He apparently travels significantly and has a pretty poor diet. He reports drinking about 6 alcoholic beverages per week and smokes about one half pack per day for the past 30 years. Recently, while up in [place], he was suffering from a kidney stone and was noted to have atrial fibrillation. He was unaware of this, and was given medication to slow his heart rate. Apparently he converted back into sinus and was instructed to be on aspirin.  He has recently been describing some chest pain which is substernal and radiates to the jaw and down into his right arm. The symptoms last for about 5-10 minutes and go away at rest, if he has been exerting himself. He also has a history of heart disease both in his mother and a father who had high blood pressure.  In addition he filled out an S4 sleepiness scale today which was scored as 12 suggesting a higher than likely possibility of sleep apnea.  He does report difficulty staying asleep, and feeling drowsy throughout the day with the need to take frequent naps.   Paul Todd returns today for followup of his nuclear stress test which was performed on 02/02/2013. This showed essentially no reversible ischemia, however the EF was reduced at 48%. It is unclear to me whether this is a gating abnormality or perhaps due to long-standing hypertension, undiagnosed sleep apnea, or multivessel coronary disease. He did have the episode of paroxysmal atrial fibrillation and continues to have chest pain which is very concerning for angina. He also had a recent lipid profile demonstrating an LDL of 111 and LDL particle number of 1774.  Triglycerides were elevated at 206.  I recommended starting Lipitor 40 mg daily.  Unfortunately, while undergoing work-up, he  presented on 02/12/13 with a NSTEMI. He is followed by Dr Debara Pickett and had recently been seen in the office for chest pain. He underwent a Myoview on 02/02/13 which returned abnormal with an EF of 48%, but there was no ischemia. He was then scheduled for an elective cardiac cath for the next week. However, on 02/12/13 he woke up with SSCP "tightness" associated with diaphoresis, SOB, and radiation to his jaw and right arm and proceeded to the ED. While in the ED the patient developed nausea and was noted to be bradycardic with HR in the 30s and SBP dropped to the 50s. ECG showed complete heart block but with a narrow escape. He was given atropine 0.5 mg with rise in HR to the 70s, NSR. Patient's second troponin returned abnormal at 0.17 and continued to climb >20. His episode of complete heart block was suspected to be vagally mediated from nausea; however, it was thought that RCA ischemia needed to be ruled out completely. It was decided that he proceed with a cardiac cath.   Cardiac cath on 02/12/13 revealed an acutely occluded RCA which was successfully treated with PTCA and diffuse stenting due to RCA stenoses beyond the point of total occlusion. The patient also had high-grade concomitant proximal LAD disease which required staged PCI. Left ventriculography revealed an ejection fraction of ~50%. There was evidence for mild mid and distal inferior hypocontractility as well. The patient developed atrial fibrillation during the procedure which was rate controlled with an IV beta blocker and then a low dose oral agent. He  underwent staged PCI to LAD on 02/14/13 with successful PTCA/DES x 1 to proximal LAD and he was placed on ASA/Brilinta.  It was felt that the patient would not tolerate atrial fibrillation well and underwent successful TEE/DCCV on 02/15/13. He is on triple therapy with coumadin. The patient had some issues with hematuria (likely from foley cath related injury) prompting more serious discission about his  anticoagulation status. Dr. Burt Knack reviewed his options considering his recent MI and multivessel stenting as well as indication for anticoagulation with atrial fib and felt it best that he continue ASA 81 mg, switch brilinta to plavix and continue heparin/warfarin overlap, which would be limited to 3-6 months depending on his clinical course. Of note patient also had some shortness of breath on Brilinta, which was resolved when he was switched to Plavix.  Today he reports doing fairly well other than low back pain. He did have a positive sleep study and still reports difficulty sleeping at night. Although he has not been set with a CPAP machine. We need to try to arrange that. The difficulty may have been affected he lost his insurance. He reports to me that he did however secure a job and will be starting in the next few weeks. He's actually did be working 10 hour days and is concerned about his ability to stay on a seat for that long. He's having problems with his back as mentioned and has seen Dr. Maryjean Ka who is contemplating injections. I had initially cleared him for this however realize that he just had recent stenting and that we would be unable to stop his antiplatelet therapy for at least one year, which is up in February 2016.  I saw Paul Todd back today. He is doing much better. He is now using CPAP and reports some improvement in his sleep. He continues to have problems with back pain which is his main concern. Is now been over a year since he underwent drug-eluting stent placement and I feel comfortable adjusting his antiplatelet medicines. We had previously kept him on Plavix and Zarrella toe due to the high bleeding risk of being on triple therapy. He had a small amount of PAF which was post procedure and has a low CHADSVASC score of 0-1 (possible hypertension). Given his young age and the fact that his atrial fibrillation was mostly procedurally related and related to a kidney stone attack,  and the fact that he is now on CPAP which likely increased his risk of atrial fibrillation, I suspect his burden of age of fibrillation is very low. He is in sinus rhythm today. I would like to discontinue Xarelto and switch him over to aspirin and Plavix. He could of course stop this as needed for back injections which he will would likely pursue within the next couple of weeks.  Paul Todd returns today. Unfortunately, he only been off of Xarelto for a few weeks to allow back injections, but then developed acute chest pain and was found to have acute pulmonary embolus at [facility] by VQ scan. Placed back on Xarelto 15 mg twice a day and is about to complete the 21 day course at that dose at which time he'll switch to the 20 mg daily dose. He reports she's been fatigue but that his chest pains gotten better. His EKG does show that he is back in atrial fibrillation today. At his last office visit he was in sinus rhythm. It may be that is in A. fib and that's why he  feels bad.  Paul Todd returns today for follow-up. His ongoing complaint is continued low back pain. Unfortunately because of recurrent A. fib and pulmonary embolus he has not been able to come off of anticoagulation for having this addressed. At this point he is likely able to go ahead with any workup that he needs regarding his back. He is recently been started on take a 7 and had an uneventful loading in the hospital. He's been maintaining sinus rhythm. He is tolerating Xarelto without bleeding problems. He has had some shortness of breath recently and based on his last TEE prior to cardioversion in 2015 EF was 40-45%. This was in the setting of atrial fibrillation and has not been reassessed. I ordered an echocardiogram which was completed today at the hospital and those results indicated that his EF has improved to 55-60%. This would put him at low risk for surgery.  08/23/2015  Paul Todd returns today for follow-up. He  continues to be struggling with back pain is but is planning to have surgery on September 6. He's here today for preoperative risk assessment. After what he's been through the past year at this point I feel that he is at acceptable risk for surgery. He has completed more than 1 year of anticoagulation for his pulmonary embolus and could be taken off of Xarelto for short period of time. He's also had adequate time for stent healing and aspirin could be discontinued for surgery is well. He reports good control of his A. fib although has some very short episodes of breakthrough lasting more than 10-15 seconds but perhaps he's had only 2 or 3 of those events since starting on the T consent. He's also concerned about continued weight gain. Weight now is 255 with a BMI 48. He thinks this could be related to medicines however it may be more likely related to inactivity and he also is reporting fatigue and sluggishness, symptoms prior to when he started on CPAP, but I suspect this is also related to narcotics and/or his Cymbalta.  11/25/2016  Paul Todd returns today for follow-up.  He reports persistent low back pain.  He underwent surgery and now is contemplating another surgery.  He is gotten a second opinion after his symptoms have not significantly improved.  Apparently there is some instability in his vertebrae may need spinal fusion.  He denies any chest pain or worsening shortness of breath.  He does get some peripheral numbness and tingling which is suggestive of neuropathy.  Blood pressure is well-controlled today 114/70.  He is maintained on dofetilide and his EKG shows sinus rhythm with a QTC of 370 ms.  He takes Xarelto for anticoagulation and has had no bleeding complications with that.  He is also on low-dose aspirin for coronary disease.  12/18/2017  Paul Todd is seen today in follow-up.  I recently saw him in the hospital and he underwent heart catheterization demonstrating multivessel  coronary disease.  Ultimately this led to the need for coronary artery bypass grafting.  He underwent three-vessel CABG with LIMA to LAD, SVG to OM and SVG to ramus by Dr. Darcey Nora.  Subsequently struggled with atrial fibrillation and has had atrial flutter.  He underwent A. fib ablation by Dr. Rayann Heman and has been on amiodarone.  Recently followed up with Roderic Palau, NP in the A. fib clinic.  He has been in sinus rhythm.  He says his energy level is not improved significantly however he does seem to be slowly recovering from  surgery.  He cannot do heavy lifting and is frustrated that he has chest soreness.  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  He is currently doing light duty.  He is scheduled to see follow-up with Dr. Darcey Nora in January and hopefully can have his lifting restrictions removed.  He is also scheduled for cardiac rehabilitation which he can regain participation in [employer].  PMHx:  Past Medical History:  Diagnosis Date  . Arthritis       . Atrial flutter ([place])   . CAD (coronary artery disease)    multivessel per cath 02/12/13, s/p stenting to RCA  . Chronic lower back pain   . Diabetes mellitus without complication ([place])   . Dyslipidemia   . History of kidney stones   . Hx of Bell's palsy   . Hypertension   . Myocardial infarction ([place]) 02/12/2013   . Nephrolithiasis   . Neuromuscular disorder ([facility])    Bell's Palsy  . OSA on CPAP   . PE (pulmonary thromboembolism) ([place]) 02/2014  . Persistent atrial fibrillation         Past Surgical History:  Procedure Laterality Date  . ABDOMINAL AORTOGRAM W/LOWER EXTREMITY N/A 01/26/2017   Procedure: ABDOMINAL AORTOGRAM W/LOWER EXTREMITY;  Surgeon: Angelia Mould, MD;  Location: [place] [facility];  Service: Cardiovascular;  Laterality: N/A;  . ATRIAL FIBRILLATION ABLATION  11/19/2017  . ATRIAL FIBRILLATION ABLATION N/A 11/19/2017   Procedure: ATRIAL  FIBRILLATION ABLATION;  Surgeon: Thompson Grayer, MD;  Location: [place] [facility];  Service: Cardiovascular;  Laterality: N/A;  . BACK SURGERY  2017   laminectomy  . CARDIOVERSION N/A 02/15/2013   Procedure: CARDIOVERSION;  Surgeon: Thayer Headings, MD;  Location: [place];  Service: Cardiovascular;  Laterality: N/A;  . CARDIOVERSION N/A 07/07/2014   Procedure: CARDIOVERSION;  Surgeon: Pixie Casino, MD;  Location: [facility] ENDOSCOPY;  Service: Cardiovascular;  Laterality: N/A;  . CARDIOVERSION N/A 08/31/2017   Procedure: CARDIOVERSION;  Surgeon: Dorothy Spark, MD;  Location[facility] ENDOSCOPY;  Service: Cardiovascular;  Laterality: N/A;  . CARDIOVERSION N/A 09/29/2017   Procedure: CARDIOVERSION;  Surgeon: Sanda Klein, MD;  Location: [place];  Service: Cardiovascular;  Laterality: N/A;  . CORONARY ANGIOPLASTY WITH STENT PLACEMENT  02/12/2013  . CORONARY ARTERY BYPASS GRAFT N/A 08/24/2017   Procedure: CORONARY ARTERY BYPASS GRAFTING (CABG) X3. RIGHT ENDOSCOPIC SAPHENOUS VEIN HARVEST. MAMM TO LAD, SVG TO OM, SVG TO RAMUS;  Surgeon: Ivin Poot, MD;  Location: [place];  Service: Open Heart Surgery;  Laterality: N/A;  . CYSTOSCOPY WITH RETROGRADE PYELOGRAM, URETEROSCOPY AND STENT PLACEMENT Left 09/02/2013   Procedure: CYSTOSCOPY WITH RETROGRADE PYELOGRAM/LEFT URETEROSCOPY/URETERAL STENT, with fulguration;  Surgeon: Festus Aloe, MD;  Location: [facility];  Service: Urology;  Laterality: Left;  . CYSTOSCOPY WITH URETEROSCOPY AND STENT PLACEMENT Left 09/16/2013   Procedure: CYSTOSCOPY WITH URETEROSCOPY AND STENT PLACEMENT;  Surgeon: Festus Aloe, MD;  Location: [facility];  Service: Urology;  Laterality: Left;  . FEMORAL BYPASS Left 04/07/2017   below the knee popliteal  . FEMORAL-POPLITEAL BYPASS GRAFT Left 04/07/2017   Procedure: LEFT FEMORAL-BELOW KNEE POPLITEAL ARTERY BYPASS USING COMPOSITE LEFT GREATER SAPHENOUS VEIN;  Surgeon: Angelia Mould, MD;  Location: [place];  Service: Vascular;   Laterality: Left;  . HOLMIUM LASER APPLICATION Left 07/07/6376   Procedure: HOLMIUM LASER APPLICATION;  Surgeon: Festus Aloe, MD;  Location: [facility];  Service: Urology;  Laterality: Left;  .  INTRAVASCULAR PRESSURE WIRE/FFR STUDY N/A 08/21/2017   Procedure: INTRAVASCULAR PRESSURE WIRE/FFR STUDY;  Surgeon: Sherren Mocha, MD;  Location: [place] [facility];  Service: Cardiovascular;  Laterality: N/A;  . LEFT HEART CATH AND CORONARY ANGIOGRAPHY N/A 08/21/2017   Procedure: LEFT HEART CATH AND CORONARY ANGIOGRAPHY;  Surgeon: Sherren Mocha, MD;  Location: [place] [facility];  Service: Cardiovascular;  Laterality: N/A;  . LEFT HEART CATH AND CORS/GRAFTS ANGIOGRAPHY N/A 12/10/2017   Procedure: LEFT HEART CATH AND CORS/GRAFTS ANGIOGRAPHY;  Surgeon: Troy Sine, MD;  Location: [place] [facility];  Service: Cardiovascular;  Laterality: N/A;  . LEFT HEART CATHETERIZATION WITH CORONARY ANGIOGRAM N/A 02/12/2013   Procedure: LEFT HEART CATHETERIZATION WITH CORONARY ANGIOGRAM;  Surgeon: Troy Sine, MD;  Location: [facility] CATH LAB;  Service: Cardiovascular;  Laterality: N/A;  . PERCUTANEOUS CORONARY STENT INTERVENTION (PCI-S)  02/12/2013   Procedure: PERCUTANEOUS CORONARY STENT INTERVENTION (PCI-S);  Surgeon: Troy Sine, MD;  Location: [facility] CATH LAB;  Service: Cardiovascular;;  STEMI  . PERCUTANEOUS CORONARY STENT INTERVENTION (PCI-S) N/A 02/14/2013   Procedure: PERCUTANEOUS CORONARY STENT INTERVENTION (PCI-S);  Surgeon: Burnell Blanks, MD;  Location: [facility] CATH LAB;  Service: Cardiovascular;  Laterality: N/A;  Proximal LAD  . TEE WITHOUT CARDIOVERSION N/A 02/15/2013   Procedure: TRANSESOPHAGEAL ECHOCARDIOGRAM (TEE);  Surgeon: Thayer Headings, MD;  Location: [place];  Service: Cardiovascular;  Laterality: N/A;  . TEE WITHOUT CARDIOVERSION N/A 08/24/2017   Procedure: TRANSESOPHAGEAL ECHOCARDIOGRAM (TEE);  Surgeon: Prescott Gum, Collier Salina, MD;  Location: [place];  Service: Open Heart Surgery;  Laterality: N/A;  .  TEE WITHOUT CARDIOVERSION N/A 08/31/2017   Procedure: TRANSESOPHAGEAL ECHOCARDIOGRAM (TEE);  Surgeon: Dorothy Spark, MD;  Location: [facility] ENDOSCOPY;  Service: Cardiovascular;  Laterality: N/A;    FAMHx:  Family History  Problem Relation Age of Onset  . Heart disease Mother   . Diabetes Mother   . Stroke Mother   . Diabetes Father     SOCHx:   reports that he quit smoking about 4 years ago. His smoking use included cigarettes. He has a 17.50 pack-year smoking history. He has never used smokeless tobacco. He reports that he does not drink alcohol or use drugs.  ALLERGIES:  No Known Allergies  ROS: Pertinent items noted in HPI and remainder of comprehensive ROS otherwise negative.  HOME MEDS: Current Outpatient Medications  Medication Sig Dispense Refill  . ACCU-CHEK AVIVA PLUS test strip USE UP TO 4 TIMES DAILY AS DIRECTED 120 each 0  . ACCU-CHEK SOFTCLIX LANCETS lancets USE UP TO 4 TIMES DAILY AS DIRECTED 100 each 0  . acetaminophen (TYLENOL) 650 MG CR tablet Take 1,300 mg by mouth every 8 (eight) hours as needed for pain.     Marland Kitchen amiodarone (PACERONE) 200 MG tablet Take 1 tablet (200 mg total) by mouth daily. 90 tablet 3  . aspirin EC 81 MG tablet Take 81 mg by mouth daily.    Marland Kitchen atorvastatin (LIPITOR) 80 MG tablet Take 80 mg by mouth daily.    . blood glucose meter kit and supplies KIT Dispense based on patient and insurance preference. Use up to four times daily as directed. (FOR ICD-9 250.00, 250.01). 1 each 0  . docusate sodium (COLACE) 100 MG capsule Take 100 mg by mouth 2 (two) times daily.   5  . DULoxetine (CYMBALTA) 60 MG capsule Take 60 mg by mouth every evening.    Marland Kitchen JARDIANCE 10 MG TABS tablet Take 10 mg by mouth daily.  3  .  magnesium oxide (MAG-OX) 400 (241.3 MG) MG tablet Take 1 tablet (400 mg total) by mouth daily. (Patient taking differently: Take 250 mg by mouth daily. ) 30 tablet 11  . metoprolol tartrate (LOPRESSOR) 25 MG tablet Take 1 tablet (25 mg total) by  mouth 2 (two) times daily. 60 tablet 6  . Multiple Vitamin (MULTIVITAMIN WITH MINERALS) TABS tablet Take 1 tablet by mouth daily.     . nitroGLYCERIN (NITROSTAT) 0.4 MG SL tablet PLACE 1 TABLET UNDER TONGUE EVERY 5 MINUTES AS NEEDED FOR CHEST PAIN MAX 3 TABLETS 25 tablet 3  . NON FORMULARY at bedtime. CPAP    . pantoprazole (PROTONIX) 40 MG tablet Take 1 tablet (40 mg total) by mouth daily. 90 tablet 1  . Potassium 99 MG TABS Take 99 mg by mouth daily.     Alveda Reasons 20 MG TABS tablet TAKE 1 TABLET (20 MG TOTAL) DAILY WITH SUPPER BY MOUTH. (Patient taking differently: Take 20 mg by mouth daily with supper. ) 90 tablet 1   No current facility-administered medications for this visit.     LABS/IMAGING: No results found for this or any previous visit (from the past 48 hour(s)). No results found.  VITALS: BP 111/66   Pulse 66   Ht '5\' 7"'$  (1.702 m)   Wt 240 lb 9.6 oz (109.1 kg)   BMI 37.68 kg/m   EXAM: General appearance: alert and no distress Neck: no carotid bruit, no JVD and thyroid not enlarged, symmetric, no tenderness/mass/nodules Lungs: clear to auscultation bilaterally Heart: regular rate and rhythm, S1, S2 normal, no murmur, click, rub or gallop Abdomen: soft, non-tender; bowel sounds normal; no masses,  no organomegaly Extremities: extremities normal, atraumatic, no cyanosis or edema Pulses: 2+ and symmetric Skin: Skin color, texture, turgor normal. No rashes or lesions Neurologic: Grossly normal Psych: Normal  EKG: Deferred  ASSESSMENT: 1. CAD s/p PCi to the entire RCA (50+ mm) with DES in 02/2013 and mid-LAD 02/2013 2. Multivessel CAD status post CABG (LIMA to LAD, SVG to OM, SVG to ramus) -09/2017 [place] 3. Paroxysmal atrial fibrillation -status post A. fib ablation (11/2017) on amiodarone 4. OSA on CPAP 5. Low back pain - low risk for surgery 6. Pulmonary embolus of unknown etiology (02/2014) on Xarelto 7. PAD status post left femoral-popliteal bypass (04/2017)  [place]  PLAN: 1.   Mr. Mccarney has had a significant year.  He was ultimately found to have multivessel coronary disease and required three-vessel bypass despite prior PCI to the RCA and LAD in the past.  He is also struggled with recurrent atrial fibrillation and underwent ablation in November and is now in sinus on amiodarone.  He has significant PAD and underwent left femoral to popliteal bypass in April 2019 as well.  Overall he continues to feel some fatigue and is slowly improving.  Hopefully he will be able to return to full duty in January.  We will plan to continue on his current dose of amiodarone.  This will be addressed by Dr. Rayann Heman in February.  Follow-up with me in 6 months.  Pixie Casino, MD, [facility], [place] Director of the Advanced Lipid Disorders &  Cardiovascular Risk Reduction Clinic Attending Cardiologist  Direct Dial: [phone]  Fax: [phone]  Website:  [email or web address] 12/18/2017, 10:43 AM

## 2017-12-18 NOTE — Patient Instructions (Signed)
Medication Instructions:  Continue current medications If you need a refill on your cardiac medications before your next appointment, please call your pharmacy.   Follow-Up: At [facility], you and your health needs are our priority.  As part of our continuing mission to provide you with exceptional heart care, we have created designated Provider Care Teams.  These Care Teams include your primary Cardiologist (physician) and Advanced Practice Providers (APPs -  Physician Assistants and Nurse Practitioners) who all work together to provide you with the care you need, when you need it. You will need a follow up appointment in 6 months.  Please call our office 2 months in advance to schedule this appointment.  You may see Kenneth C Hilty, MD or one of the following Advanced Practice Providers on your designated Care Team: Hao Meng, PA-C . Angela Duke, PA-C  Any Other Special Instructions Will Be Listed Below (If Applicable).    

## 2017-12-21 ENCOUNTER — Encounter (HOSPITAL_COMMUNITY): Payer: BLUE CROSS/BLUE SHIELD

## 2017-12-22 ENCOUNTER — Ambulatory Visit (HOSPITAL_COMMUNITY): Payer: Self-pay | Admitting: Nurse Practitioner

## 2017-12-22 ENCOUNTER — Telehealth (HOSPITAL_COMMUNITY): Payer: Self-pay | Admitting: *Deleted

## 2017-12-23 ENCOUNTER — Encounter (HOSPITAL_COMMUNITY): Payer: BLUE CROSS/BLUE SHIELD

## 2017-12-23 ENCOUNTER — Encounter (HOSPITAL_COMMUNITY)
Admission: RE | Admit: 2017-12-23 | Discharge: 2017-12-23 | Disposition: A | Discharge status: Home / Self Care | Payer: BLUE CROSS/BLUE SHIELD | Source: Ambulatory Visit | Attending: Internal Medicine | Admitting: Internal Medicine

## 2017-12-23 ENCOUNTER — Other Ambulatory Visit: Payer: Self-pay | Admitting: Physician Assistant

## 2017-12-23 DIAGNOSIS — Z951 Presence of aortocoronary bypass graft: Secondary | ICD-10-CM

## 2017-12-23 NOTE — Progress Notes (Signed)
Reviewed home exercise guidelines with patient including endpoints, temperature precautions, target heart rate and rate of perceived exertion. Pt is walking as his mode of home exercise. Pt voices understanding of instructions given. Sol Passer, MS, ACSM CEP

## 2017-12-25 ENCOUNTER — Encounter (HOSPITAL_COMMUNITY)
Admission: RE | Admit: 2017-12-25 | Discharge: 2017-12-25 | Disposition: A | Discharge status: Home / Self Care | Payer: BLUE CROSS/BLUE SHIELD | Source: Ambulatory Visit | Attending: Internal Medicine | Admitting: Internal Medicine

## 2017-12-25 ENCOUNTER — Encounter (HOSPITAL_COMMUNITY): Payer: BLUE CROSS/BLUE SHIELD

## 2017-12-25 DIAGNOSIS — Z951 Presence of aortocoronary bypass graft: Secondary | ICD-10-CM

## 2017-12-28 ENCOUNTER — Encounter (HOSPITAL_COMMUNITY): Payer: BLUE CROSS/BLUE SHIELD

## 2018-01-01 ENCOUNTER — Encounter (HOSPITAL_COMMUNITY)
Admission: RE | Admit: 2018-01-01 | Discharge: 2018-01-01 | Disposition: A | Discharge status: Home / Self Care | Payer: BLUE CROSS/BLUE SHIELD | Source: Ambulatory Visit | Attending: Internal Medicine | Admitting: Internal Medicine

## 2018-01-01 ENCOUNTER — Encounter (HOSPITAL_COMMUNITY): Payer: BLUE CROSS/BLUE SHIELD

## 2018-01-01 VITALS — BP 122/70 | HR 72 | Ht 66.25 in | Wt 241.4 lb

## 2018-01-01 DIAGNOSIS — Z951 Presence of aortocoronary bypass graft: Secondary | ICD-10-CM

## 2018-01-04 ENCOUNTER — Encounter (HOSPITAL_COMMUNITY): Payer: BLUE CROSS/BLUE SHIELD

## 2018-01-04 ENCOUNTER — Encounter (HOSPITAL_COMMUNITY)
Admission: RE | Admit: 2018-01-04 | Discharge: 2018-01-04 | Disposition: A | Discharge status: Home / Self Care | Payer: BLUE CROSS/BLUE SHIELD | Source: Ambulatory Visit | Attending: Internal Medicine | Admitting: Internal Medicine

## 2018-01-04 DIAGNOSIS — Z951 Presence of aortocoronary bypass graft: Secondary | ICD-10-CM

## 2018-01-04 NOTE — Progress Notes (Signed)
Discharge Progress Report  Patient Details  Name: Paul Todd MRN: [id number] Date of Birth: 1960-04-24 Referring Provider:     CARDIAC REHAB PHASE II ORIENTATION from 11/10/2017 in [place]  Referring Provider  Pixie Casino MD        Number of Visits: 8  Reason for Discharge:  Early Exit:  Back to work  Smoking History:  Social History   Tobacco Use  Smoking Status Former Smoker  . Packs/day: 0.50  . Years: 35.00  . Pack years: 17.50  . Types: Cigarettes  . Last attempt to quit: 02/12/2013  . 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)  ADL UCSD:   Initial Exercise Prescription: Initial Exercise Prescription - 11/10/17 1000      Date of Initial Exercise RX and Referring Provider   Date  11/10/17    Referring Provider  Pixie Casino MD     Expected Discharge Date  02/15/18      Recumbant Bike   Level  1.5    Watts  30    Minutes  10    METs  2.86      NuStep   Level  2    SPM  75    Minutes  10    METs  2.5      Track   Laps  8    Minutes  10    METs  2.38      Prescription Details   Frequency (times per week)  3x    Duration  Progress to 30 minutes of continuous aerobic without signs/symptoms of physical distress      Intensity   THRR 40-80% of Max Heartrate  65-130    Ratings of Perceived Exertion  11-13    Perceived Dyspnea  0-4      Progression   Progression  Continue progressive overload as per policy without signs/symptoms or physical distress.      Resistance Training   Training Prescription  Yes    Weight  3lbs    Reps  10-15       Discharge Exercise Prescription (Final Exercise Prescription Changes): Exercise Prescription Changes - 01/04/18 0950      Response to Exercise   Blood Pressure (Admit)  122/70    Blood Pressure (Exercise)  164/84    Blood Pressure (Exit)  130/72    Heart Rate (Admit)  72 bpm     Heart Rate (Exercise)  91 bpm    Heart Rate (Exit)  64 bpm    Rating of Perceived Exertion (Exercise)  11    Symptoms  none    Duration  Progress to 30 minutes of  aerobic without signs/symptoms of physical distress    Intensity  THRR unchanged      Progression   Progression  Continue to progress workloads to maintain intensity without signs/symptoms of physical distress.    Average METs  2.9      Resistance Training   Training Prescription  Yes    Weight  5lbs    Reps  10-15    Time  10 Minutes      Interval Training   Interval Training  No      Recumbant Bike   Level  2.5    Watts  39    Minutes  10    METs  3.14      NuStep  Level  3    SPM  75    Minutes  10    METs  2.8      Track   Laps  10    Minutes  10    METs  2.74      Home Exercise Plan   Plans to continue exercise at  Home (comment)   Walking   Frequency  Add 3 additional days to program exercise sessions.    Initial Home Exercises Provided  12/23/17       Functional Capacity: 6 Minute Walk    Row Name 11/10/17 1028 01/01/18 1008       6 Minute Walk   Phase  -  Discharge    Distance  1200 feet  1200 feet    Distance % Change  -  0 %    Distance Feet Change  -  0 ft    Walk Time  5.25 minutes  5 minutes    # of Rest Breaks  1  1    MPH  2.27  2.27    METS  2.88  2.71    RPE  13  17    Perceived Dyspnea   0  0    VO2 Peak  10.1  9.5    Symptoms  Yes (comment)  Yes (comment)    Comments  9/10 Claudication Pain (right leg). Stopped test at 5:25  Patient c/o bilateral leg cramps/ claudication from PAD. Unable to continue after 5 minutes due to leg cramps.    Resting HR  75 bpm  67 bpm    Resting BP  114/70  128/62    Resting Oxygen Saturation   97 %  -    Exercise Oxygen Saturation  during 6 min walk  98 %  -    Max Ex. HR  114 bpm  89 bpm    Max Ex. BP  138/80  144/78    2 Minute Post BP  112/70  120/64       Psychological, QOL, Others - Outcomes: PHQ 2/9: Depression screen [facility]  2/9 02/01/2018 11/30/2017 10/08/2017  Decreased Interest 0 0 0  Down, Depressed, Hopeless 0 0 0  PHQ - 2 Score 0 0 0  Some recent data might be hidden    Quality of Life: Quality of Life - 01/04/18 1007      Quality of Life   Select  Quality of Life      Quality of Life Scores   Health/Function Pre  17.6 %    Health/Function Post  21.2 %    Health/Function % Change  20.45 %    Socioeconomic Pre  23.3 %    Socioeconomic Post  25.36 %    Socioeconomic % Change   8.84 %    Psych/Spiritual Pre  22.1 %    Psych/Spiritual Post  23.79 %    Psych/Spiritual % Change  7.65 %    Family Pre  24 %    Family Post  28.8 %    Family % Change  20 %    GLOBAL Pre  20.7 %    GLOBAL Post  23.71 %    GLOBAL % Change  14.54 %       Personal Goals: Goals established at orientation with interventions provided to work toward goal. Personal Goals and Risk Factors at Admission - 11/10/17 1045      Core Components/Risk Factors/Patient Goals on Admission    Weight Management  Yes;Obesity;Weight  Loss    Intervention  Weight Management: Develop a combined nutrition and exercise program designed to reach desired caloric intake, while maintaining appropriate intake of nutrient and fiber, sodium and fats, and appropriate energy expenditure required for the weight goal.;Weight Management: Provide education and appropriate resources to help participant work on and attain dietary goals.;Weight Management/Obesity: Establish reasonable short term and long term weight goals.;Obesity: Provide education and appropriate resources to help participant work on and attain dietary goals.    Admit Weight  246 lb 14.6 oz (112 kg)    Goal Weight: Long Term  236 lb (107 kg)    Expected Outcomes  Short Term: Continue to assess and modify interventions until short term weight is achieved;Long Term: Adherence to nutrition and physical activity/exercise program aimed toward attainment of established weight goal;Weight Loss:  Understanding of general recommendations for a balanced deficit meal plan, which promotes 1-2 lb weight loss per week and includes a negative energy balance of [phone] kcal/d;Understanding recommendations for meals to include 15-35% energy as protein, 25-35% energy from fat, 35-60% energy from carbohydrates, less than 21m of dietary cholesterol, 20-35 gm of total fiber daily;Understanding of distribution of calorie intake throughout the day with the consumption of 4-5 meals/snacks    Diabetes  Yes    Intervention  Provide education about signs/symptoms and action to take for hypo/hyperglycemia.;Provide education about proper nutrition, including hydration, and aerobic/resistive exercise prescription along with prescribed medications to achieve blood glucose in normal ranges: Fasting glucose 65-99 mg/dL    Expected Outcomes  Short Term: Participant verbalizes understanding of the signs/symptoms and immediate care of hyper/hypoglycemia, proper foot care and importance of medication, aerobic/resistive exercise and nutrition plan for blood glucose control.;Long Term: Attainment of HbA1C < 7%.    Hypertension  Yes    Intervention  Provide education on lifestyle modifcations including regular physical activity/exercise, weight management, moderate sodium restriction and increased consumption of fresh fruit, vegetables, and low fat dairy, alcohol moderation, and smoking cessation.;Monitor prescription use compliance.    Expected Outcomes  Short Term: Continued assessment and intervention until BP is < 140/945mHG in hypertensive participants. < 130/8031mG in hypertensive participants with diabetes, heart failure or chronic kidney disease.;Long Term: Maintenance of blood pressure at goal levels.    Lipids  Yes    Intervention  Provide education and support for participant on nutrition & aerobic/resistive exercise along with prescribed medications to achieve LDL <64m85mDL >40mg77m Expected Outcomes  Short  Term: Participant states understanding of desired cholesterol values and is compliant with medications prescribed. Participant is following exercise prescription and nutrition guidelines.;Long Term: Cholesterol controlled with medications as prescribed, with individualized exercise RX and with personalized nutrition plan. Value goals: LDL < 64mg,72m > 40 mg.        Personal Goals Discharge: Goals and Risk Factor Review    Row Name 12/15/17 1543 01/04/18 1041           Core Components/Risk Factors/Patient Goals Review   Personal Goals Review  Weight Management/Obesity;Lipids;Diabetes;Hypertension  Weight Management/Obesity;Lipids;Diabetes;Hypertension      Review  Derrall hMurleen doing well with exercise. Tynan wDicksonecently hospitalized for chest pain. Omar's exercise is currently on hold  Smokey hShanardeen doing well with exercise. Clevester hKhangesumed cardiac rehab and graduates today      Expected Outcomes  Ademola wLeovardocontinue to participate in phase 2 cardiac rehab for exercise, nutrition and life style modification  Brick wDeloiscontinue to walk on his own upon  graduation from phase 2 cardiac rehab, nutrition and life style modification         Exercise Goals and Review: Exercise Goals    Row Name 11/10/17 1032             Exercise Goals   Increase Physical Activity  Yes       Intervention  Provide advice, education, support and counseling about physical activity/exercise needs.;Develop an individualized exercise prescription for aerobic and resistive training based on initial evaluation findings, risk stratification, comorbidities and participant's personal goals.       Expected Outcomes  Short Term: Attend rehab on a regular basis to increase amount of physical activity.;Long Term: Add in home exercise to make exercise part of routine and to increase amount of physical activity.;Long Term: Exercising regularly at least 3-5 days a week.       Increase Strength and Stamina  Yes        Intervention  Develop an individualized exercise prescription for aerobic and resistive training based on initial evaluation findings, risk stratification, comorbidities and participant's personal goals.;Provide advice, education, support and counseling about physical activity/exercise needs.       Expected Outcomes  Short Term: Increase workloads from initial exercise prescription for resistance, speed, and METs.;Short Term: Perform resistance training exercises routinely during rehab and add in resistance training at home;Long Term: Improve cardiorespiratory fitness, muscular endurance and strength as measured by increased METs and functional capacity (6MWT)       Able to understand and use rate of perceived exertion (RPE) scale  Yes       Intervention  Provide education and explanation on how to use RPE scale       Expected Outcomes  Short Term: Able to use RPE daily in rehab to express subjective intensity level;Long Term:  Able to use RPE to guide intensity level when exercising independently       Knowledge and understanding of Target Heart Rate Range (THRR)  Yes       Intervention  Provide education and explanation of THRR including how the numbers were predicted and where they are located for reference       Expected Outcomes  Short Term: Able to state/look up THRR;Long Term: Able to use THRR to govern intensity when exercising independently;Short Term: Able to use daily as guideline for intensity in rehab       Able to check pulse independently  Yes       Intervention  Provide education and demonstration on how to check pulse in carotid and radial arteries.;Review the importance of being able to check your own pulse for safety during independent exercise       Expected Outcomes  Short Term: Able to explain why pulse checking is important during independent exercise;Long Term: Able to check pulse independently and accurately       Understanding of Exercise Prescription  Yes       Intervention   Provide education, explanation, and written materials on patient's individual exercise prescription       Expected Outcomes  Short Term: Able to explain program exercise prescription;Long Term: Able to explain home exercise prescription to exercise independently          Exercise Goals Re-Evaluation: Exercise Goals Re-Evaluation    Row Name 11/30/17 1055 12/23/17 1003 01/01/18 1015 01/04/18 1038       Exercise Goal Re-Evaluation   Exercise Goals Review  Increase Physical Activity;Able to understand and use rate of perceived exertion (RPE) scale  Increase  Physical Activity;Able to understand and use rate of perceived exertion (RPE) scale;Knowledge and understanding of Target Heart Rate Range (THRR);Able to check pulse independently;Understanding of Exercise Prescription  Increase Physical Activity;Able to understand and use rate of perceived exertion (RPE) scale;Knowledge and understanding of Target Heart Rate Range (THRR);Able to check pulse independently;Understanding of Exercise Prescription  Increase Physical Activity;Able to understand and use rate of perceived exertion (RPE) scale;Knowledge and understanding of Target Heart Rate Range (THRR);Able to check pulse independently;Understanding of Exercise Prescription    Comments  Patient able to understand and use RPE scale appropriately.  Reviewed home exercise guidelines with patient including THRR, RPE scale, and endpoints for exercise. Pt states he's walking 20-30 minutes, 3 days/week. Pt states he's able to manually count his own pulse.  Patient will complete the phase 2 cardiac rehab program early and will continue exercise at local gym. Pt states that he plans to exercise at least 3 days/week, 30-45 minutes at the gym.  Patient completed the phase 2 CR program. Reviewed exercise prescription with patient.    Expected Outcomes  Increase workloads as tolerated to help improve cardiorespiratory fitness. Increase walking duration to be able to  tolerate PAD pain better.  Patient will continue walking 3 days/week in addition to exercise at cardiac rehab to help walk longer without pain from PAD.  Patient will continue walking 3 days/week in addition to exercise at cardiac rehab to help walk longer without pain from PAD.  Patient will continue exercise 30-45 minutes at least 3 days/week at local gym to maintain health and fitness gains.       Nutrition & Weight - Outcomes: Pre Biometrics - 11/10/17 1031      Pre Biometrics   Height  5' 6.25" (1.683 m)    Weight  112 kg    Waist Circumference  46 inches    Hip Circumference  49 inches    Waist to Hip Ratio  0.94 %    BMI (Calculated)  39.54    Triceps Skinfold  32 mm    % Body Fat  38 %    Grip Strength  38 kg    Flexibility  0 in    Single Leg Stand  18.31 seconds      Post Biometrics - 01/04/18 0950       Post  Biometrics   Height  5' 6.25" (1.683 m)    Weight  109.5 kg    Waist Circumference  44.5 inches    Hip Circumference  46.25 inches    Waist to Hip Ratio  0.96 %    BMI (Calculated)  38.66    Triceps Skinfold  40 mm    % Body Fat  37.9 %    Grip Strength  37 kg    Flexibility  0 in    Single Leg Stand  30 seconds       Nutrition: Nutrition Therapy & Goals - 01/29/18 1412      Nutrition Therapy   Diet  heart healthy, carb modified      Personal Nutrition Goals   Nutrition Goal  Pt to identify and limit food sources of saturated fat, trans fat, refined carbohydrates and sodium      Intervention Plan   Intervention  Prescribe, educate and counsel regarding individualized specific dietary modifications aiming towards targeted core components such as weight, hypertension, lipid management, diabetes, heart failure and other comorbidities.    Expected Outcomes  Short Term Goal: Understand basic principles of dietary content,  such as calories, fat, sodium, cholesterol and nutrients.;Long Term Goal: Adherence to prescribed nutrition plan.       Nutrition  Discharge: Nutrition Assessments - 01/29/18 1413      MEDFICTS Scores   Pre Score  46    Post Score  24    Score Difference  -22       Education Questionnaire Score: Knowledge Questionnaire Score - 01/04/18 1007      Knowledge Questionnaire Score   Pre Score  23/24    Post Score  23/24       w Goals reviewed with patient; copy given to patient.Allyn graduated from cardiac rehab program on 01/04/18 wiith completion of 8 exercise sessions in Phase II. Pt maintained good attendance and progressed nicely during his participation in rehab as evidenced by increased MET level.   Medication list reconciled. Repeat  PHQ score-0  .  Pt has made significant lifestyle changes and should be commended for his success. Pt feels he has achieved his goals during cardiac rehab. 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 Leroy lost 5 pounds while he participated in the program. We are proud of Mickie's progress.Barnet Pall, RN,BSN 02/01/2018 12:31 PM

## 2018-01-08 ENCOUNTER — Encounter (HOSPITAL_COMMUNITY): Payer: BLUE CROSS/BLUE SHIELD

## 2018-01-11 ENCOUNTER — Encounter (HOSPITAL_COMMUNITY): Payer: BLUE CROSS/BLUE SHIELD

## 2018-01-11 ENCOUNTER — Other Ambulatory Visit: Payer: Self-pay

## 2018-01-11 ENCOUNTER — Encounter: Payer: Self-pay | Admitting: Cardiothoracic Surgery

## 2018-01-11 ENCOUNTER — Ambulatory Visit (INDEPENDENT_AMBULATORY_CARE_PROVIDER_SITE_OTHER): Payer: BLUE CROSS/BLUE SHIELD | Admitting: Cardiothoracic Surgery

## 2018-01-11 VITALS — BP 108/62 | HR 60 | Resp 16 | Ht 67.0 in | Wt 234.0 lb

## 2018-01-11 DIAGNOSIS — M792 Neuralgia and neuritis, unspecified: Secondary | ICD-10-CM

## 2018-01-11 DIAGNOSIS — Z951 Presence of aortocoronary bypass graft: Secondary | ICD-10-CM

## 2018-01-11 DIAGNOSIS — I2510 Atherosclerotic heart disease of native coronary artery without angina pectoris: Secondary | ICD-10-CM

## 2018-01-11 NOTE — Progress Notes (Signed)
PCP is Everardo Beals, NP Referring Provider is Pixie Casino, MD  Chief Complaint  Patient presents with  . Routine Post Op    3 month f/u s/p CABG X 3.Marland KitchenMarland Kitchen8/19/19.Marland KitchenMarland KitchenHAD ABLATION FOR ATRIAL FLUTTER    HPI: 58 year old obese diabetic male status post CABG times 08 August 2017 for unstable angina returns for follow-up final postoperative check.  He plans on returning to work next week.  He has done well after surgery but still has some hypersensitivity of the chest incision which is well-healed and wires intact on chest x-ray. The patient had successful CABG in August.  He returned to the ER in October with chest discomfort and Was found to be in atrial flutter.  He underwent a flutter ablation by Dr. Rayann Heman in November with successful return of sinus rhythm.  He underwent cardiac cath in early December 2019 which showed all bypass grafts open and no new native CAD.  He is currently doing well with amiodarone, Xarelto, aspirin and Lipitor.  He completed his cardiac rehab program and understands the goal of heart healthy lifestyle to include 30 minutes of aerobic activity 5 days a week.  He is given a return to work note without limitations.  Past Medical History:  Diagnosis Date  . Arthritis       . Atrial flutter ([place])   . CAD (coronary artery disease)    multivessel per cath 02/12/13, s/p stenting to RCA  . Chronic lower back pain   . Diabetes mellitus without complication ([place])   . Dyslipidemia   . History of kidney stones   . Hx of Bell's palsy   . Hypertension   . Myocardial infarction ([place]) 02/12/2013   . Nephrolithiasis   . Neuromuscular disorder ([facility])    Bell's Palsy  . OSA on CPAP   . PE (pulmonary thromboembolism) ([place]) 02/2014  . Persistent atrial fibrillation         Past Surgical History:  Procedure Laterality Date  . ABDOMINAL AORTOGRAM W/LOWER EXTREMITY N/A 01/26/2017   Procedure: ABDOMINAL AORTOGRAM W/LOWER EXTREMITY;  Surgeon: Angelia Mould, MD;   Location: [facility];  Service: Cardiovascular;  Laterality: N/A;  . ATRIAL FIBRILLATION ABLATION  11/19/2017  . ATRIAL FIBRILLATION ABLATION N/A 11/19/2017   Procedure: ATRIAL FIBRILLATION ABLATION;  Surgeon: Thompson Grayer, MD;  Location: [place] [facility];  Service: Cardiovascular;  Laterality: N/A;  . BACK SURGERY  2017   laminectomy  . CARDIOVERSION N/A 02/15/2013   Procedure: CARDIOVERSION;  Surgeon: Thayer Headings, MD;  Location: [facility];  Service: Cardiovascular;  Laterality: N/A;  . CARDIOVERSION N/A 07/07/2014   Procedure: CARDIOVERSION;  Surgeon: Pixie Casino, MD;  Location: [facility] ENDOSCOPY;  Service: Cardiovascular;  Laterality: N/A;  . CARDIOVERSION N/A 08/31/2017   Procedure: CARDIOVERSION;  Surgeon: Dorothy Spark, MD;  Location: [facility] ENDOSCOPY;  Service: Cardiovascular;  Laterality: N/A;  . CARDIOVERSION N/A 09/29/2017   Procedure: CARDIOVERSION;  Surgeon: Sanda Klein, MD;  Location: [place];  Service: Cardiovascular;  Laterality: N/A;  . CORONARY ANGIOPLASTY WITH STENT PLACEMENT  02/12/2013  . CORONARY ARTERY BYPASS GRAFT N/A 08/24/2017   Procedure: CORONARY ARTERY BYPASS GRAFTING (CABG) X3. RIGHT ENDOSCOPIC SAPHENOUS VEIN HARVEST. MAMM TO LAD, SVG TO OM, SVG TO RAMUS;  Surgeon: Ivin Poot, MD;  Location: [place];  Service: Open Heart Surgery;  Laterality: N/A;  . CYSTOSCOPY WITH RETROGRADE PYELOGRAM, URETEROSCOPY AND STENT PLACEMENT Left 09/02/2013   Procedure: CYSTOSCOPY WITH RETROGRADE PYELOGRAM/LEFT URETEROSCOPY/URETERAL STENT, with fulguration;  Surgeon: Rodman Key  Junious Silk, MD;  Location: [facility];  Service: Urology;  Laterality: Left;  . CYSTOSCOPY WITH URETEROSCOPY AND STENT PLACEMENT Left 09/16/2013   Procedure: CYSTOSCOPY WITH URETEROSCOPY AND STENT PLACEMENT;  Surgeon: Festus Aloe, MD;  Location: [facility];  Service: Urology;  Laterality: Left;  . FEMORAL BYPASS Left 04/07/2017   below the knee popliteal  . FEMORAL-POPLITEAL BYPASS GRAFT Left  04/07/2017   Procedure: LEFT FEMORAL-BELOW KNEE POPLITEAL ARTERY BYPASS USING COMPOSITE LEFT GREATER SAPHENOUS VEIN;  Surgeon: Angelia Mould, MD;  Location: [facility];  Service: Vascular;  Laterality: Left;  . HOLMIUM LASER APPLICATION Left 5/00/9381   Procedure: HOLMIUM LASER APPLICATION;  Surgeon: Festus Aloe, MD;  Location: [facility];  Service: Urology;  Laterality: Left;  . INTRAVASCULAR PRESSURE WIRE/FFR STUDY N/A 08/21/2017   Procedure: INTRAVASCULAR PRESSURE WIRE/FFR STUDY;  Surgeon: Sherren Mocha, MD;  Location: [place] [facility];  Service: Cardiovascular;  Laterality: N/A;  . LEFT HEART CATH AND CORONARY ANGIOGRAPHY N/A 08/21/2017   Procedure: LEFT HEART CATH AND CORONARY ANGIOGRAPHY;  Surgeon: Sherren Mocha, MD;  Location: [facility];  Service: Cardiovascular;  Laterality: N/A;  . LEFT HEART CATH AND CORS/GRAFTS ANGIOGRAPHY N/A 12/10/2017   Procedure: LEFT HEART CATH AND CORS/GRAFTS ANGIOGRAPHY;  Surgeon: Troy Sine, MD;  Location: [facility];  Service: Cardiovascular;  Laterality: N/A;  . LEFT HEART CATHETERIZATION WITH CORONARY ANGIOGRAM N/A 02/12/2013   Procedure: LEFT HEART CATHETERIZATION WITH CORONARY ANGIOGRAM;  Surgeon: Troy Sine, MD;  Location: [facility] CATH LAB;  Service: Cardiovascular;  Laterality: N/A;  . PERCUTANEOUS CORONARY STENT INTERVENTION (PCI-S)  02/12/2013   Procedure: PERCUTANEOUS CORONARY STENT INTERVENTION (PCI-S);  Surgeon: Troy Sine, MD;  Location: [facility] CATH LAB;  Service: Cardiovascular;;  STEMI  . PERCUTANEOUS CORONARY STENT INTERVENTION (PCI-S) N/A 02/14/2013   Procedure: PERCUTANEOUS CORONARY STENT INTERVENTION (PCI-S);  Surgeon: Burnell Blanks, MD;  Location: [facility] CATH LAB;  Service: Cardiovascular;  Laterality: N/A;  Proximal LAD  . TEE WITHOUT CARDIOVERSION N/A 02/15/2013   Procedure: TRANSESOPHAGEAL ECHOCARDIOGRAM (TEE);  Surgeon: Thayer Headings, MD;  Location: [place];  Service: Cardiovascular;  Laterality: N/A;  . TEE  WITHOUT CARDIOVERSION N/A 08/24/2017   Procedure: TRANSESOPHAGEAL ECHOCARDIOGRAM (TEE);  Surgeon: Prescott Gum, Collier Salina, MD;  Location: [place];  Service: Open Heart Surgery;  Laterality: N/A;  . TEE WITHOUT CARDIOVERSION N/A 08/31/2017   Procedure: TRANSESOPHAGEAL ECHOCARDIOGRAM (TEE);  Surgeon: Dorothy Spark, MD;  Location: [facility] ENDOSCOPY;  Service: Cardiovascular;  Laterality: N/A;    Family History  Problem Relation Age of Onset  . Heart disease Mother   . Diabetes Mother   . Stroke Mother   . Diabetes Father     Social History Social History   Tobacco Use  . Smoking status: Former Smoker    Packs/day: 0.50    Years: 35.00    Pack years: 17.50    Types: Cigarettes    Last attempt to quit: 02/12/2013    Years since quitting: 4.9  . Smokeless tobacco: Never Used  Substance Use Topics  . Alcohol use: No    Comment: 07/21/2014 "never drank much; might have a couple drinks/yr since MI 02/2013"  . Drug use: No    Current Outpatient Medications  Medication Sig Dispense Refill  . ACCU-CHEK AVIVA PLUS test strip USE UP TO 4 TIMES DAILY AS DIRECTED 120 each 0  . ACCU-CHEK SOFTCLIX LANCETS lancets USE UP TO 4 TIMES DAILY AS DIRECTED 100 each 0  . acetaminophen (TYLENOL) 650 MG CR  tablet Take 1,300 mg by mouth every 8 (eight) hours as needed for pain.     Marland Kitchen amiodarone (PACERONE) 200 MG tablet Take 1 tablet (200 mg total) by mouth daily. 90 tablet 3  . aspirin EC 81 MG tablet Take 81 mg by mouth daily.    Marland Kitchen atorvastatin (LIPITOR) 80 MG tablet Take 80 mg by mouth daily.    . blood glucose meter kit and supplies KIT Dispense based on patient and insurance preference. Use up to four times daily as directed. (FOR ICD-9 250.00, 250.01). 1 each 0  . docusate sodium (COLACE) 100 MG capsule Take 100 mg by mouth 2 (two) times daily.   5  . DULoxetine (CYMBALTA) 60 MG capsule Take 60 mg by mouth every evening.    Marland Kitchen JARDIANCE 10 MG TABS tablet Take 10 mg by mouth daily.  3  . magnesium oxide (MAG-OX)  400 (241.3 MG) MG tablet Take 1 tablet (400 mg total) by mouth daily. (Patient taking differently: Take 250 mg by mouth daily. ) 30 tablet 11  . metoprolol tartrate (LOPRESSOR) 25 MG tablet Take 1 tablet (25 mg total) by mouth 2 (two) times daily. 60 tablet 6  . Multiple Vitamin (MULTIVITAMIN WITH MINERALS) TABS tablet Take 1 tablet by mouth daily.     . nitroGLYCERIN (NITROSTAT) 0.4 MG SL tablet PLACE 1 TABLET UNDER TONGUE EVERY 5 MINUTES AS NEEDED FOR CHEST PAIN MAX 3 TABLETS 25 tablet 3  . NON FORMULARY at bedtime. CPAP    . pantoprazole (PROTONIX) 40 MG tablet Take 1 tablet (40 mg total) by mouth daily. 90 tablet 1  . Potassium 99 MG TABS Take 99 mg by mouth daily.     Alveda Reasons 20 MG TABS tablet TAKE 1 TABLET (20 MG TOTAL) DAILY WITH SUPPER BY MOUTH. (Patient taking differently: Take 20 mg by mouth daily with supper. ) 90 tablet 1   No current facility-administered medications for this visit.     No Known Allergies  Review of Systems  Still some vague soreness over the chest and upper abdomen Incisions all well-healed No edema No dizziness Breath  BP 108/62 (BP Location: Right Arm, Patient Position: Sitting, Cuff Size: Large)   Pulse 60   Resp 16   Ht '5\' 7"'$  (1.702 m)   Wt 234 lb (106.1 kg)   SpO2 96% Comment: ON RA  BMI 36.65 kg/m  Physical Exam Alert and oriented and comfortable  Lungs are clear All healed Heart rate regular Abdomen soft No peripheral edema  Diagnostic Tests: None  Impression: Doing well after multivessel CABG for unstable angina followed by later EP ablation of postop atrial flutter.  Plan: He will return as needed.  The importance and components of heart healthy lifestyle and diet discussed with patient.   Len Childs, MD [facility] [phone]

## 2018-01-13 ENCOUNTER — Encounter (HOSPITAL_COMMUNITY): Payer: BLUE CROSS/BLUE SHIELD

## 2018-01-13 ENCOUNTER — Ambulatory Visit: Payer: BLUE CROSS/BLUE SHIELD | Admitting: Vascular Surgery

## 2018-01-13 ENCOUNTER — Ambulatory Visit: Payer: Self-pay | Admitting: Cardiothoracic Surgery

## 2018-01-13 ENCOUNTER — Ambulatory Visit (HOSPITAL_COMMUNITY): Payer: BLUE CROSS/BLUE SHIELD

## 2018-01-15 ENCOUNTER — Encounter (HOSPITAL_COMMUNITY): Payer: BLUE CROSS/BLUE SHIELD

## 2018-01-18 ENCOUNTER — Encounter (HOSPITAL_COMMUNITY): Payer: BLUE CROSS/BLUE SHIELD

## 2018-01-20 ENCOUNTER — Encounter (HOSPITAL_COMMUNITY): Payer: BLUE CROSS/BLUE SHIELD

## 2018-01-22 ENCOUNTER — Encounter (HOSPITAL_COMMUNITY): Payer: BLUE CROSS/BLUE SHIELD

## 2018-01-25 ENCOUNTER — Encounter (HOSPITAL_COMMUNITY): Payer: BLUE CROSS/BLUE SHIELD

## 2018-01-27 ENCOUNTER — Encounter (HOSPITAL_COMMUNITY): Payer: BLUE CROSS/BLUE SHIELD

## 2018-01-29 ENCOUNTER — Encounter (HOSPITAL_COMMUNITY): Payer: BLUE CROSS/BLUE SHIELD

## 2018-02-01 ENCOUNTER — Encounter (HOSPITAL_COMMUNITY): Payer: BLUE CROSS/BLUE SHIELD

## 2018-02-01 NOTE — Addendum Note (Signed)
Encounter addended by: Magda Kiel, RN on: 02/01/2018 12:32 PM  Actions taken: Visit Navigator Flowsheet section accepted, Clinical Note Signed

## 2018-02-03 ENCOUNTER — Encounter (HOSPITAL_COMMUNITY): Payer: BLUE CROSS/BLUE SHIELD

## 2018-02-05 ENCOUNTER — Encounter (HOSPITAL_COMMUNITY): Payer: BLUE CROSS/BLUE SHIELD

## 2018-02-07 ENCOUNTER — Other Ambulatory Visit: Payer: Self-pay | Admitting: Physician Assistant

## 2018-02-08 ENCOUNTER — Encounter (HOSPITAL_COMMUNITY): Payer: BLUE CROSS/BLUE SHIELD

## 2018-02-10 ENCOUNTER — Encounter (HOSPITAL_COMMUNITY): Payer: BLUE CROSS/BLUE SHIELD

## 2018-02-12 ENCOUNTER — Encounter (HOSPITAL_COMMUNITY): Payer: BLUE CROSS/BLUE SHIELD

## 2018-02-15 ENCOUNTER — Encounter (HOSPITAL_COMMUNITY): Payer: BLUE CROSS/BLUE SHIELD

## 2018-02-17 ENCOUNTER — Encounter (HOSPITAL_COMMUNITY): Payer: BLUE CROSS/BLUE SHIELD

## 2018-02-21 ENCOUNTER — Other Ambulatory Visit: Payer: Self-pay | Admitting: Internal Medicine

## 2018-02-22 NOTE — Telephone Encounter (Signed)
Rx(s) sent to pharmacy electronically.  

## 2018-02-24 ENCOUNTER — Ambulatory Visit: Payer: BLUE CROSS/BLUE SHIELD | Admitting: Internal Medicine

## 2018-03-01 ENCOUNTER — Ambulatory Visit: Payer: Self-pay | Admitting: Internal Medicine

## 2018-03-19 ENCOUNTER — Other Ambulatory Visit: Payer: Self-pay | Admitting: Physician Assistant

## 2018-03-19 ENCOUNTER — Other Ambulatory Visit: Payer: Self-pay | Admitting: Internal Medicine

## 2018-03-22 ENCOUNTER — Other Ambulatory Visit: Payer: Self-pay | Admitting: Internal Medicine

## 2018-04-11 ENCOUNTER — Other Ambulatory Visit: Payer: Self-pay | Admitting: Internal Medicine

## 2018-05-23 ENCOUNTER — Other Ambulatory Visit: Payer: Self-pay | Admitting: Internal Medicine

## 2018-05-23 DIAGNOSIS — I480 Paroxysmal atrial fibrillation: Secondary | ICD-10-CM

## 2018-06-01 DIAGNOSIS — R911 Solitary pulmonary nodule: Secondary | ICD-10-CM | POA: Diagnosis not present

## 2018-06-01 DIAGNOSIS — R0789 Other chest pain: Secondary | ICD-10-CM | POA: Diagnosis not present

## 2018-06-01 DIAGNOSIS — Z951 Presence of aortocoronary bypass graft: Secondary | ICD-10-CM | POA: Diagnosis not present

## 2018-07-07 ENCOUNTER — Other Ambulatory Visit: Payer: Self-pay | Admitting: Internal Medicine

## 2018-07-08 ENCOUNTER — Other Ambulatory Visit: Payer: Self-pay | Admitting: Internal Medicine

## 2018-08-01 ENCOUNTER — Other Ambulatory Visit: Payer: Self-pay | Admitting: Internal Medicine

## 2018-11-16 ENCOUNTER — Other Ambulatory Visit: Payer: Self-pay | Admitting: Internal Medicine

## 2018-11-16 DIAGNOSIS — I480 Paroxysmal atrial fibrillation: Secondary | ICD-10-CM

## 2018-11-16 NOTE — Telephone Encounter (Signed)
49m 106.1kg Scr 0.84 12/11/17 ccr 142.1 Lovw/hilty 12/18/17

## 2018-12-19 ENCOUNTER — Other Ambulatory Visit: Payer: Self-pay | Admitting: Internal Medicine

## 2018-12-21 NOTE — Telephone Encounter (Signed)
Rx has been sent to the pharmacy electronically. ° °

## 2018-12-28 DIAGNOSIS — Z125 Encounter for screening for malignant neoplasm of prostate: Secondary | ICD-10-CM | POA: Diagnosis not present

## 2018-12-28 DIAGNOSIS — E119 Type 2 diabetes mellitus without complications: Secondary | ICD-10-CM | POA: Diagnosis not present

## 2018-12-28 DIAGNOSIS — R202 Paresthesia of skin: Secondary | ICD-10-CM | POA: Diagnosis not present

## 2018-12-28 DIAGNOSIS — Z1321 Encounter for screening for nutritional disorder: Secondary | ICD-10-CM | POA: Diagnosis not present

## 2018-12-28 DIAGNOSIS — E559 Vitamin D deficiency, unspecified: Secondary | ICD-10-CM | POA: Diagnosis not present

## 2019-02-22 DIAGNOSIS — Z1212 Encounter for screening for malignant neoplasm of rectum: Secondary | ICD-10-CM | POA: Diagnosis not present

## 2019-02-22 DIAGNOSIS — Z1211 Encounter for screening for malignant neoplasm of colon: Secondary | ICD-10-CM | POA: Diagnosis not present

## 2019-04-20 ENCOUNTER — Other Ambulatory Visit: Payer: Self-pay | Admitting: Internal Medicine

## 2019-05-13 ENCOUNTER — Other Ambulatory Visit: Payer: Self-pay | Admitting: Internal Medicine

## 2019-05-30 ENCOUNTER — Other Ambulatory Visit: Payer: Self-pay | Admitting: Internal Medicine

## 2019-05-30 DIAGNOSIS — I480 Paroxysmal atrial fibrillation: Secondary | ICD-10-CM

## 2019-05-30 NOTE — Telephone Encounter (Signed)
Rx request sent to pharmacy.  

## 2019-06-13 ENCOUNTER — Other Ambulatory Visit: Payer: Self-pay | Admitting: Internal Medicine

## 2019-06-20 DIAGNOSIS — Z6841 Body Mass Index (BMI) 40.0 and over, adult: Secondary | ICD-10-CM | POA: Diagnosis not present

## 2019-06-20 DIAGNOSIS — H6503 Acute serous otitis media, bilateral: Secondary | ICD-10-CM | POA: Diagnosis not present

## 2019-06-20 DIAGNOSIS — R0982 Postnasal drip: Secondary | ICD-10-CM | POA: Diagnosis not present

## 2019-06-27 ENCOUNTER — Other Ambulatory Visit: Payer: Self-pay | Admitting: Internal Medicine

## 2019-07-15 DIAGNOSIS — Z125 Encounter for screening for malignant neoplasm of prostate: Secondary | ICD-10-CM | POA: Diagnosis not present

## 2019-07-15 DIAGNOSIS — Z79899 Other long term (current) drug therapy: Secondary | ICD-10-CM | POA: Diagnosis not present

## 2019-07-15 DIAGNOSIS — E559 Vitamin D deficiency, unspecified: Secondary | ICD-10-CM | POA: Diagnosis not present

## 2019-07-15 DIAGNOSIS — E1169 Type 2 diabetes mellitus with other specified complication: Secondary | ICD-10-CM | POA: Diagnosis not present

## 2019-07-15 DIAGNOSIS — E782 Mixed hyperlipidemia: Secondary | ICD-10-CM | POA: Diagnosis not present

## 2019-07-15 DIAGNOSIS — H6501 Acute serous otitis media, right ear: Secondary | ICD-10-CM | POA: Diagnosis not present

## 2019-07-15 DIAGNOSIS — R0982 Postnasal drip: Secondary | ICD-10-CM | POA: Diagnosis not present

## 2019-07-15 DIAGNOSIS — Z1321 Encounter for screening for nutritional disorder: Secondary | ICD-10-CM | POA: Diagnosis not present

## 2019-07-18 DIAGNOSIS — Z1321 Encounter for screening for nutritional disorder: Secondary | ICD-10-CM | POA: Diagnosis not present

## 2019-07-18 DIAGNOSIS — Z79899 Other long term (current) drug therapy: Secondary | ICD-10-CM | POA: Diagnosis not present

## 2019-07-18 DIAGNOSIS — E559 Vitamin D deficiency, unspecified: Secondary | ICD-10-CM | POA: Diagnosis not present

## 2019-07-18 DIAGNOSIS — Z125 Encounter for screening for malignant neoplasm of prostate: Secondary | ICD-10-CM | POA: Diagnosis not present

## 2019-07-31 ENCOUNTER — Other Ambulatory Visit: Payer: Self-pay | Admitting: Internal Medicine

## 2019-08-05 DIAGNOSIS — H7291 Unspecified perforation of tympanic membrane, right ear: Secondary | ICD-10-CM | POA: Diagnosis not present

## 2019-08-05 DIAGNOSIS — Z6841 Body Mass Index (BMI) 40.0 and over, adult: Secondary | ICD-10-CM | POA: Diagnosis not present

## 2019-08-05 DIAGNOSIS — E1169 Type 2 diabetes mellitus with other specified complication: Secondary | ICD-10-CM | POA: Diagnosis not present

## 2019-08-05 DIAGNOSIS — R972 Elevated prostate specific antigen [PSA]: Secondary | ICD-10-CM | POA: Diagnosis not present

## 2019-08-12 DIAGNOSIS — I110 Hypertensive heart disease with heart failure: Secondary | ICD-10-CM | POA: Diagnosis not present

## 2019-08-13 ENCOUNTER — Other Ambulatory Visit: Payer: Self-pay | Admitting: Internal Medicine

## 2019-08-15 NOTE — Telephone Encounter (Signed)
Rx(s) sent to pharmacy electronically.  

## 2019-08-25 DIAGNOSIS — R972 Elevated prostate specific antigen [PSA]: Secondary | ICD-10-CM | POA: Diagnosis not present

## 2019-09-05 DIAGNOSIS — R972 Elevated prostate specific antigen [PSA]: Secondary | ICD-10-CM | POA: Diagnosis not present

## 2019-09-05 DIAGNOSIS — H7291 Unspecified perforation of tympanic membrane, right ear: Secondary | ICD-10-CM | POA: Diagnosis not present

## 2019-09-05 DIAGNOSIS — Z6841 Body Mass Index (BMI) 40.0 and over, adult: Secondary | ICD-10-CM | POA: Diagnosis not present

## 2019-10-19 DIAGNOSIS — E1169 Type 2 diabetes mellitus with other specified complication: Secondary | ICD-10-CM | POA: Diagnosis not present

## 2019-10-19 DIAGNOSIS — Z6841 Body Mass Index (BMI) 40.0 and over, adult: Secondary | ICD-10-CM | POA: Diagnosis not present

## 2019-10-19 DIAGNOSIS — I10 Essential (primary) hypertension: Secondary | ICD-10-CM | POA: Diagnosis not present

## 2019-10-19 DIAGNOSIS — J069 Acute upper respiratory infection, unspecified: Secondary | ICD-10-CM | POA: Diagnosis not present

## 2019-10-24 DIAGNOSIS — Z955 Presence of coronary angioplasty implant and graft: Secondary | ICD-10-CM | POA: Diagnosis not present

## 2019-10-24 DIAGNOSIS — R0789 Other chest pain: Secondary | ICD-10-CM | POA: Diagnosis not present

## 2019-10-24 DIAGNOSIS — E119 Type 2 diabetes mellitus without complications: Secondary | ICD-10-CM | POA: Diagnosis not present

## 2019-10-24 DIAGNOSIS — R079 Chest pain, unspecified: Secondary | ICD-10-CM | POA: Diagnosis not present

## 2019-10-24 DIAGNOSIS — I252 Old myocardial infarction: Secondary | ICD-10-CM | POA: Diagnosis not present

## 2019-10-24 DIAGNOSIS — U071 COVID-19: Secondary | ICD-10-CM | POA: Diagnosis not present

## 2019-10-24 DIAGNOSIS — Z951 Presence of aortocoronary bypass graft: Secondary | ICD-10-CM | POA: Diagnosis not present

## 2020-01-05 ENCOUNTER — Other Ambulatory Visit: Payer: Self-pay | Admitting: Internal Medicine

## 2020-01-15 ENCOUNTER — Other Ambulatory Visit: Payer: Self-pay | Admitting: Internal Medicine

## 2020-01-15 DIAGNOSIS — I480 Paroxysmal atrial fibrillation: Secondary | ICD-10-CM

## 2020-02-13 ENCOUNTER — Other Ambulatory Visit: Payer: Self-pay | Admitting: Internal Medicine

## 2020-02-13 DIAGNOSIS — I480 Paroxysmal atrial fibrillation: Secondary | ICD-10-CM

## 2020-07-11 IMAGING — DX DG CHEST 1V PORT
1 series · 1 of 1 positions shown · non-contrast
Comparison: Earlier same day.

CLINICAL DATA: Followup CABG

EXAM:
PORTABLE CHEST 1 VIEW

[chest]
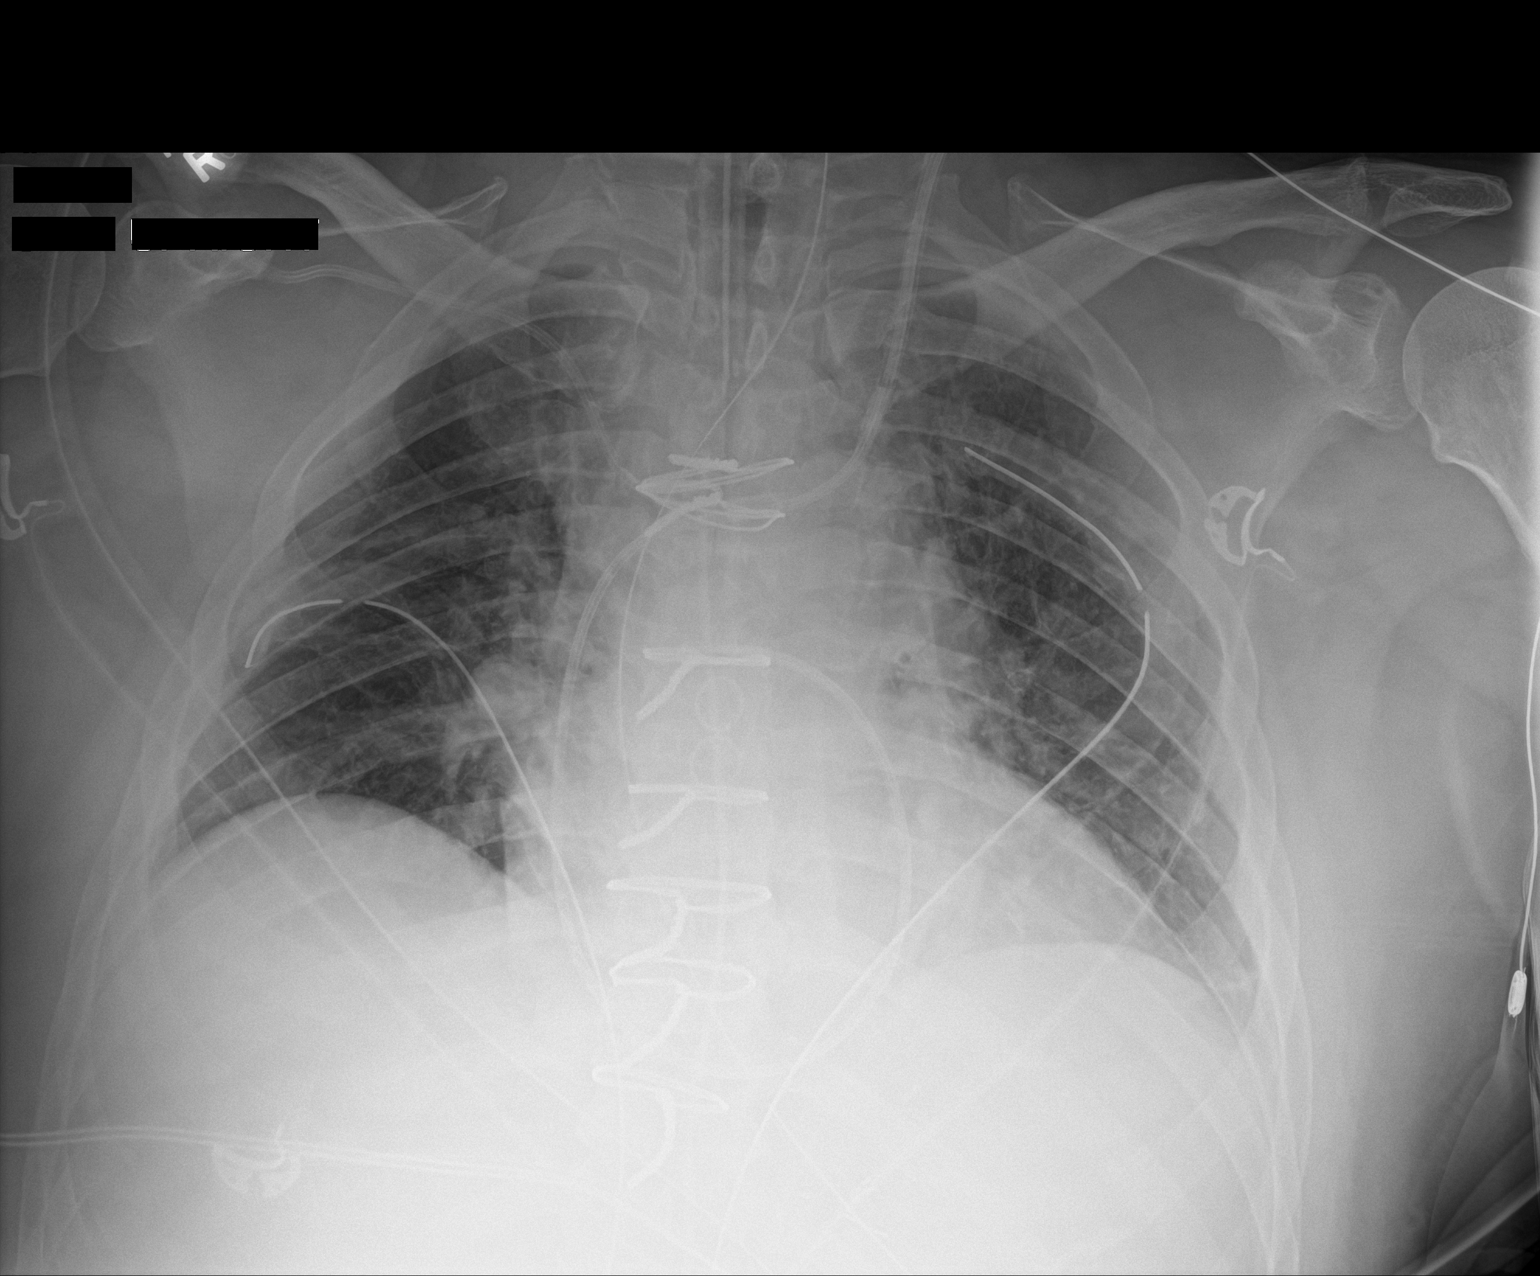

[1 of 1 positions shown; findings below may reference images not displayed]

FINDINGS: Median sternotomy and CABG. Endotracheal tube tip is 5 cm above the
carina. Nasogastric or orogastric tube enters the stomach. Swan-Ganz
catheter inserted from a left jugular approach has its tip in the
right main pulmonary artery. Bilateral chest tubes in place. No
pneumothorax. Right subclavian central line tip appears to cross
into the innominate vein. The lungs are clear. No pulmonary edema.
IMPRESSION: Status post CABG. No pneumothorax. Lungs well aerated. No edema.
Lines and tubes well positioned, with the exception that the right
subclavian central line appears to cross over into the innominate
vein

## 2020-07-11 IMAGING — CR DG CHEST 1V PORT
1 series · 1 of 1 positions shown · non-contrast
Comparison: 08/22/2017

CLINICAL DATA: Line placement

EXAM:
PORTABLE CHEST 1 VIEW

[AP]
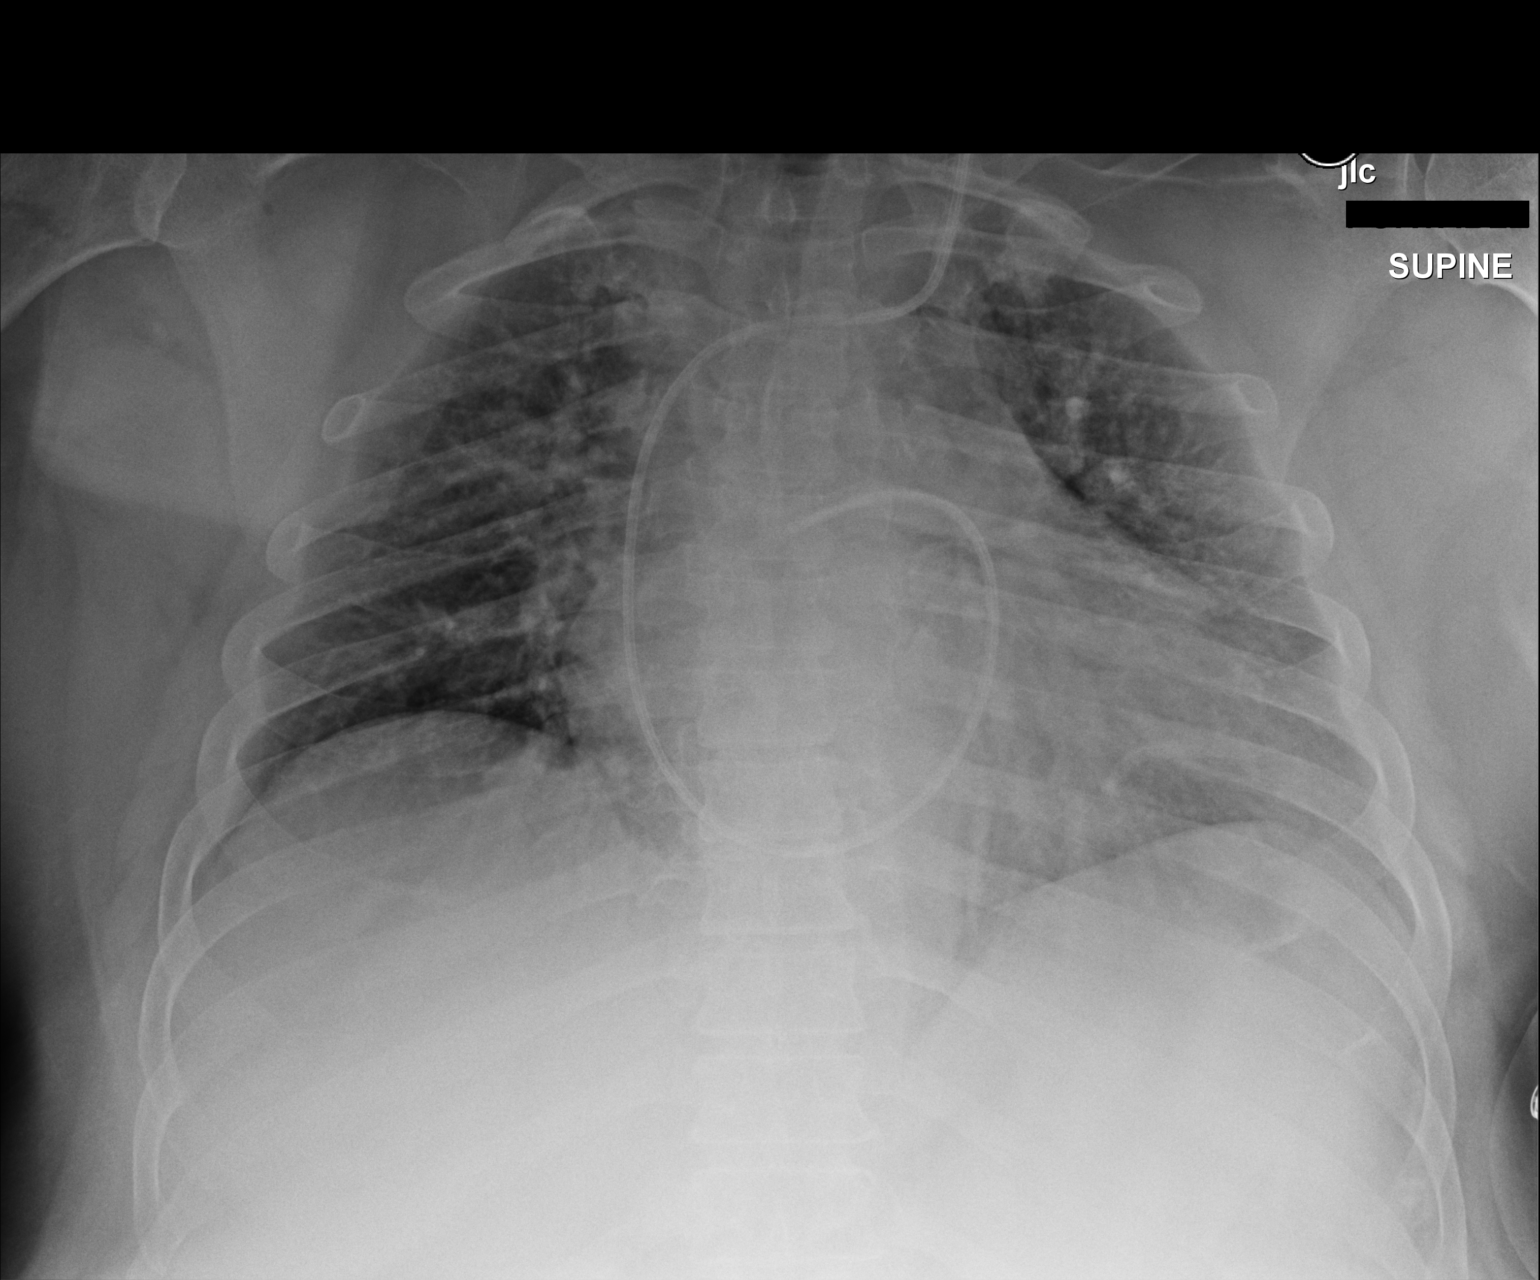

[1 of 1 positions shown; findings below may reference images not displayed]

FINDINGS: Interval placement of Swan-Ganz catheter with the tip in the central
right pulmonary artery. No pneumothorax. Cardiomegaly. Vascular
congestion and increasing interstitial prominence, likely
interstitial edema. Low lung volumes. No effusions.
IMPRESSION: Placement of Swan-Ganz catheter with the tip in the central right
pulmonary artery. No pneumothorax.

Low lung volumes with vascular congestion and possible early
interstitial edema.

## 2020-07-12 IMAGING — DX DG CHEST 1V PORT
1 series · 1 of 1 positions shown · non-contrast
Comparison: 08/24/2017.

CLINICAL DATA: Intubation.

EXAM:
PORTABLE CHEST 1 VIEW

[chest ap]
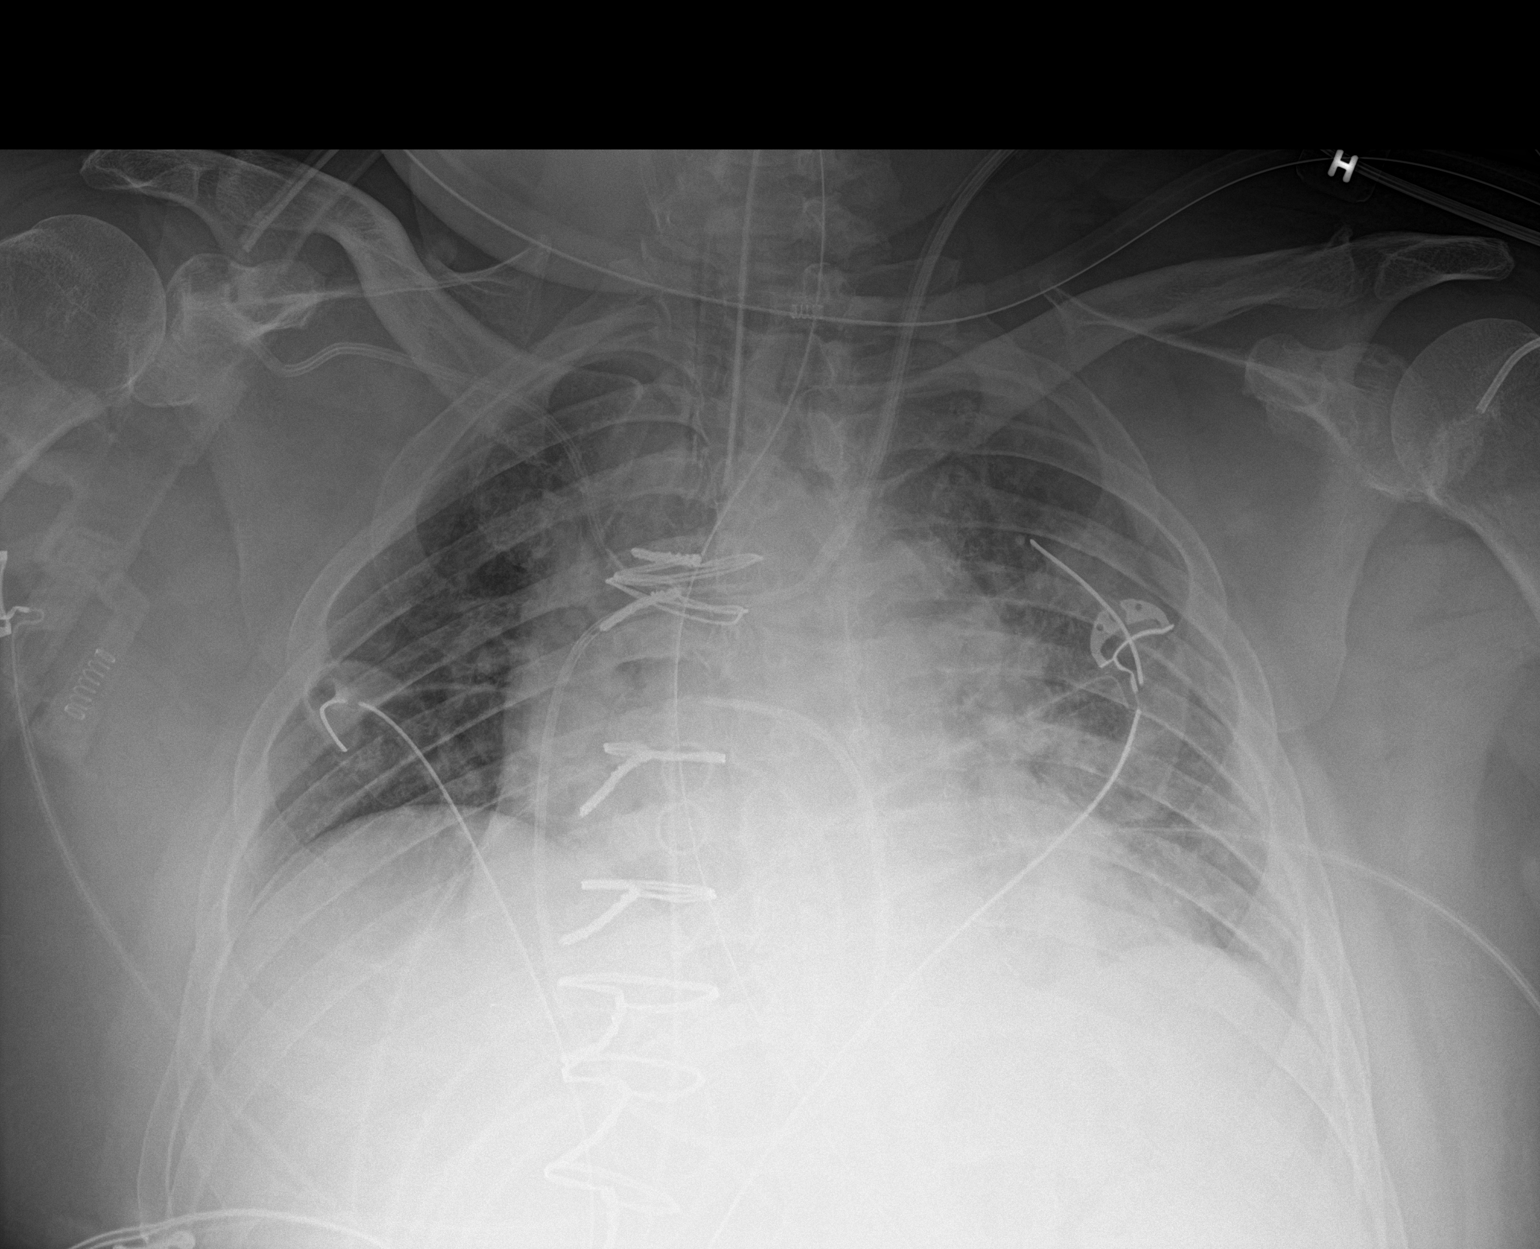

[1 of 1 positions shown; findings below may reference images not displayed]

FINDINGS: Endotracheal tube, Swan-Ganz catheter, bilateral chest tubes in
stable position. Right subclavian line stable position with its tip
again projected over the innominate vein. Prior CABG. Stable
cardiomegaly. Low lung volumes with bibasilar atelectasis again
noted. Left lung base infiltrate noted on today's exam.
IMPRESSION: 1.  Lines and tubes in stable position.

2.  Prior CABG.  Cardiomegaly.

3. Low lung volumes with bibasilar atelectasis. Left base infiltrate
noted on today's exam.

## 2020-08-26 IMAGING — DX DG CHEST 2V
2 series · 2 of 2 positions shown · non-contrast
Comparison: 09/30/2017

CLINICAL DATA: Recent CABG.  Chest pain beginning last night.

EXAM:
CHEST - 2 VIEW

[w chest pa]
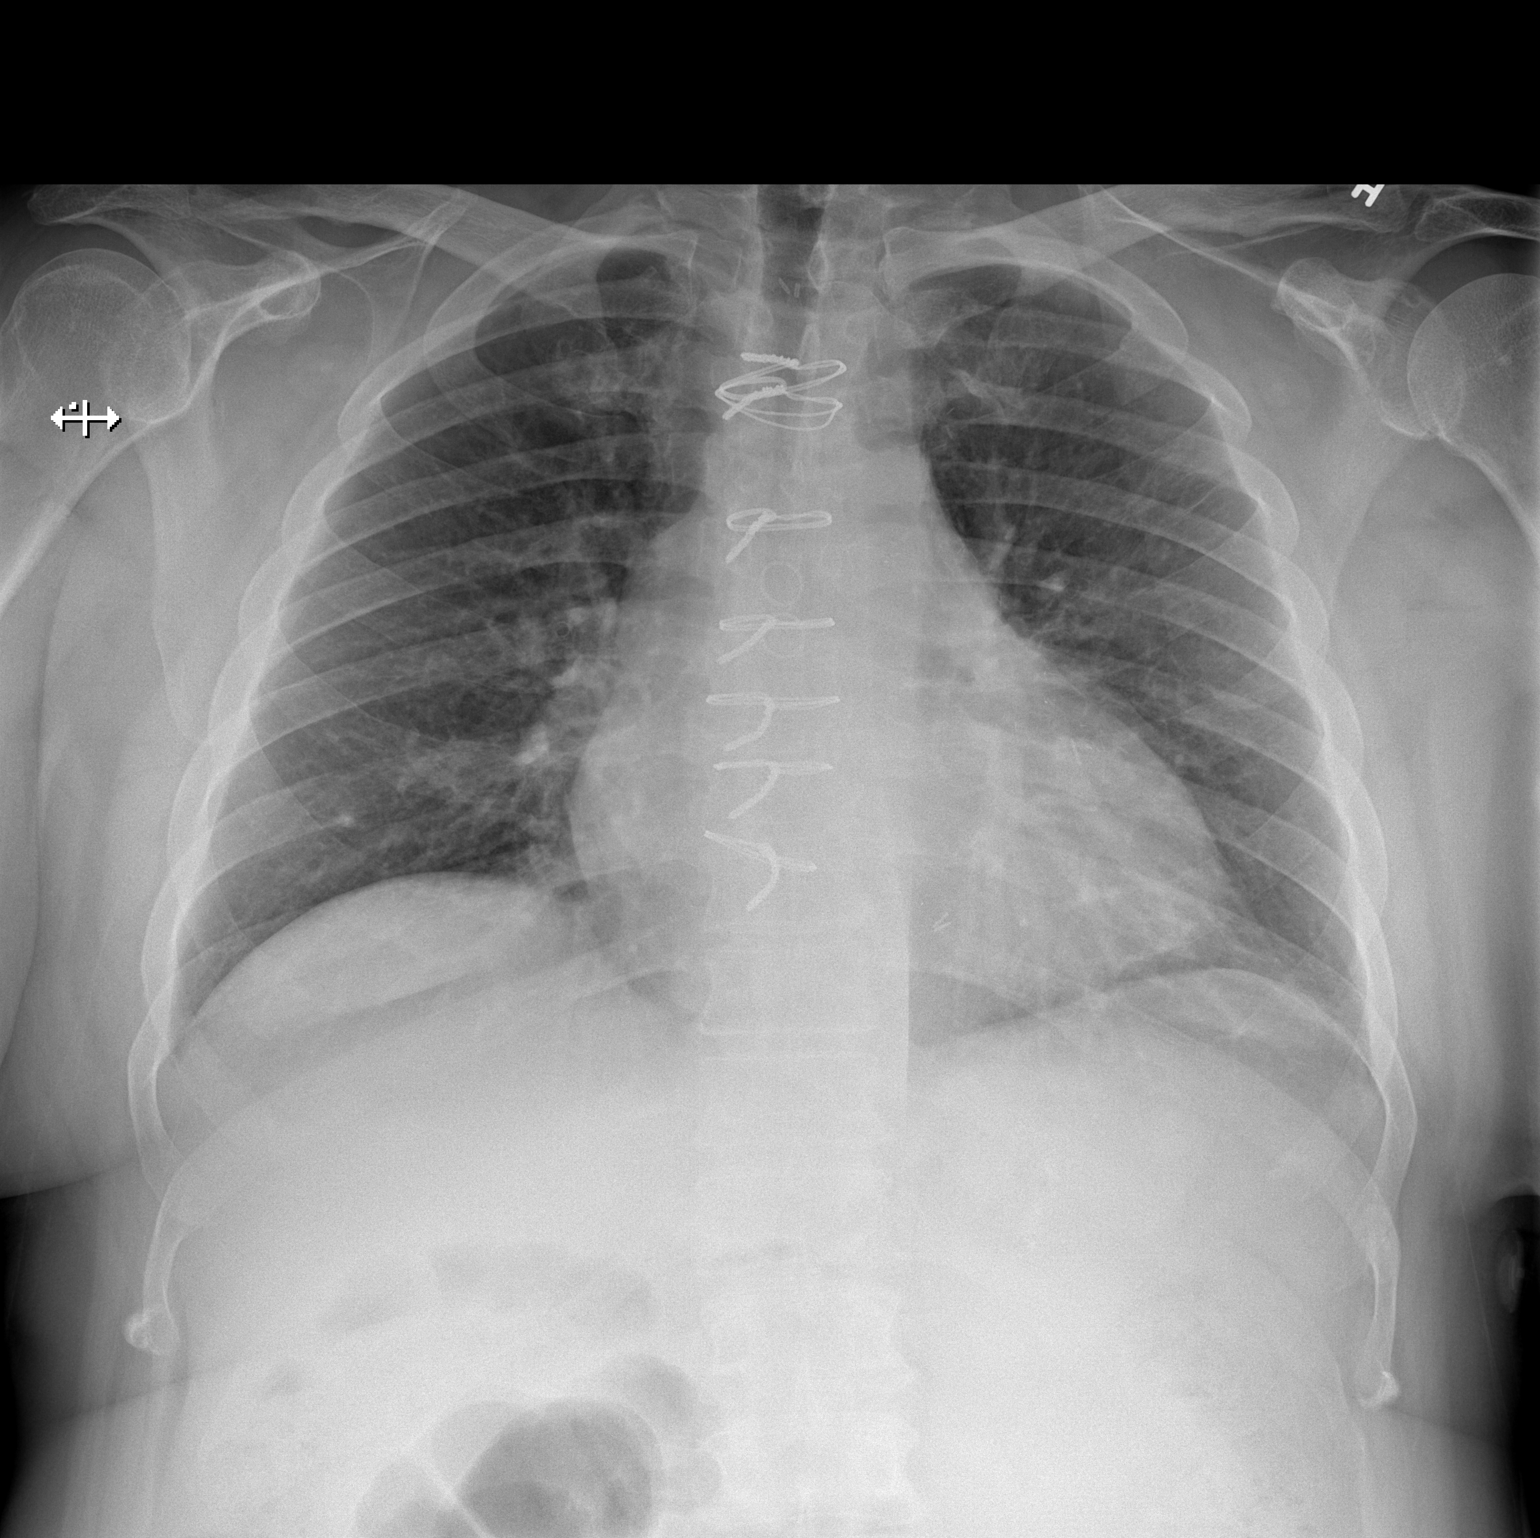

[w chest lat]
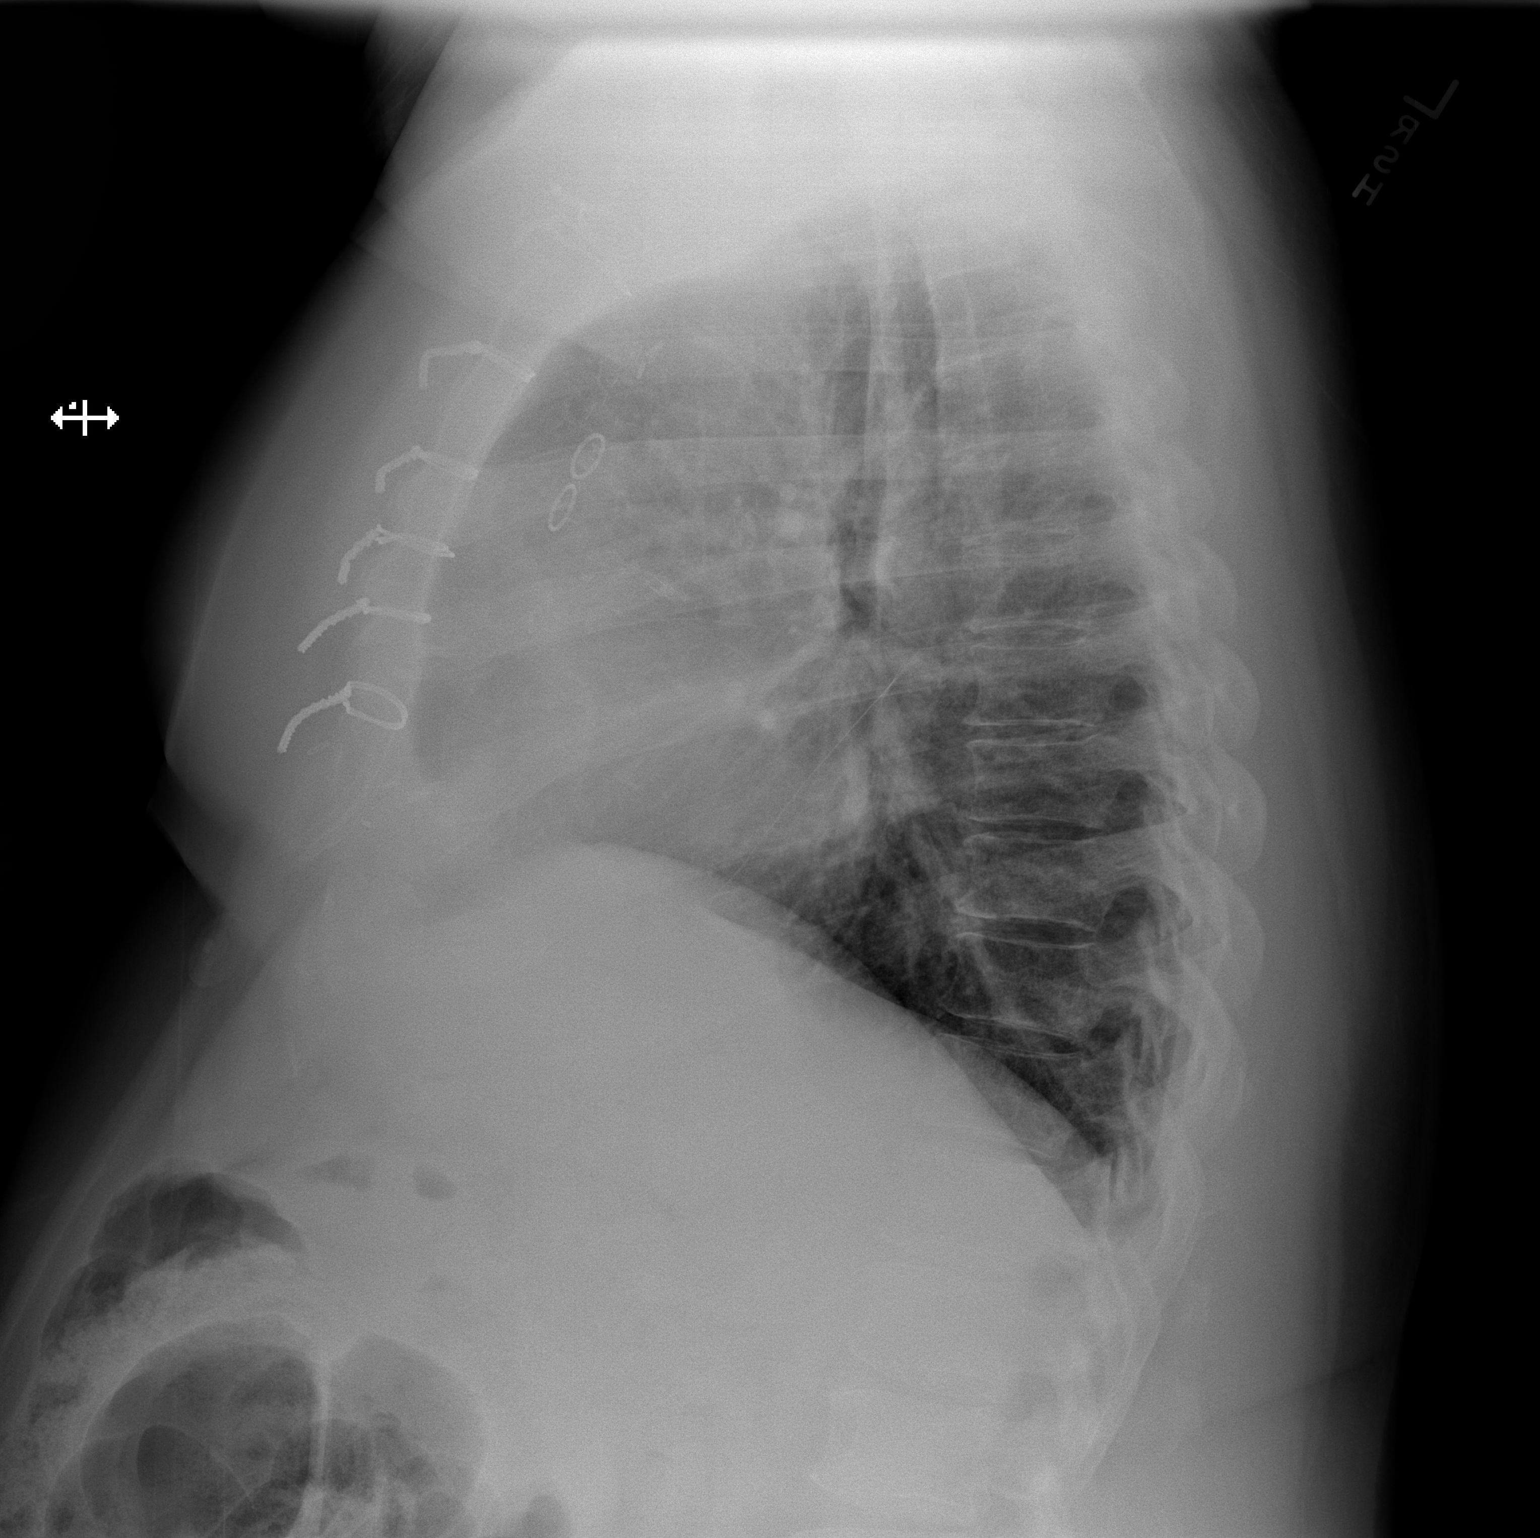

[2 of 2 positions shown; findings below may reference images not displayed]

FINDINGS: Previous median sternotomy and CABG. Heart size upper limits of
normal. Mediastinal shadows otherwise normal. The lungs are clear.
Vascularity is normal. No effusions. No significant bone finding.
IMPRESSION: Previous CABG.  No active disease.
